# Patient Record
Sex: Female | Born: 1950 | Race: Black or African American | Hispanic: No | Marital: Married | Smoking: Never smoker
Health system: Southern US, Community
[De-identification: ages and names within clinical notes are randomized; demographics above are authoritative.]

## PROBLEM LIST (undated history)

## (undated) ENCOUNTER — Ambulatory Visit (HOSPITAL_COMMUNITY): Discharge status: Absent without leave | Payer: 59

## (undated) DIAGNOSIS — R0789 Other chest pain: Secondary | ICD-10-CM

## (undated) DIAGNOSIS — K7689 Other specified diseases of liver: Secondary | ICD-10-CM

## (undated) DIAGNOSIS — K219 Gastro-esophageal reflux disease without esophagitis: Secondary | ICD-10-CM

## (undated) DIAGNOSIS — M1990 Unspecified osteoarthritis, unspecified site: Secondary | ICD-10-CM

## (undated) DIAGNOSIS — I2510 Atherosclerotic heart disease of native coronary artery without angina pectoris: Secondary | ICD-10-CM

## (undated) DIAGNOSIS — R05 Cough: Secondary | ICD-10-CM

## (undated) DIAGNOSIS — T7840XA Allergy, unspecified, initial encounter: Secondary | ICD-10-CM

## (undated) DIAGNOSIS — I10 Essential (primary) hypertension: Secondary | ICD-10-CM

## (undated) DIAGNOSIS — L509 Urticaria, unspecified: Secondary | ICD-10-CM

## (undated) DIAGNOSIS — E785 Hyperlipidemia, unspecified: Secondary | ICD-10-CM

## (undated) HISTORY — DX: Gastro-esophageal reflux disease without esophagitis: K21.9

## (undated) HISTORY — PX: CHOLECYSTECTOMY: SHX55

## (undated) HISTORY — DX: Urticaria, unspecified: L50.9

## (undated) HISTORY — DX: Other specified diseases of liver: K76.89

## (undated) HISTORY — PX: ABDOMINAL HYSTERECTOMY: SHX81

## (undated) HISTORY — PX: OTHER SURGICAL HISTORY: SHX169

## (undated) HISTORY — DX: Cough: R05

## (undated) HISTORY — PX: BREAST EXCISIONAL BIOPSY: SUR124

## (undated) HISTORY — DX: Other chest pain: R07.89

## (undated) HISTORY — PX: BREAST BIOPSY: SHX20

## (undated) HISTORY — PX: VESICOVAGINAL FISTULA CLOSURE W/ TAH: SUR271

## (undated) HISTORY — DX: Essential (primary) hypertension: I10

## (undated) HISTORY — DX: Allergy, unspecified, initial encounter: T78.40XA

## (undated) HISTORY — DX: Hyperlipidemia, unspecified: E78.5

## (undated) HISTORY — DX: Atherosclerotic heart disease of native coronary artery without angina pectoris: I25.10

---

## 1998-10-03 ENCOUNTER — Ambulatory Visit (HOSPITAL_COMMUNITY): Admission: RE | Admit: 1998-10-03 | Discharge: 1998-10-03 | Payer: Self-pay | Admitting: Unknown Physician Specialty

## 1998-10-05 ENCOUNTER — Encounter: Payer: Self-pay | Admitting: Emergency Medicine

## 1998-10-05 ENCOUNTER — Emergency Department (HOSPITAL_COMMUNITY): Admission: EM | Admit: 1998-10-05 | Discharge: 1998-10-05 | Payer: Self-pay | Admitting: Emergency Medicine

## 1998-12-06 ENCOUNTER — Ambulatory Visit (HOSPITAL_COMMUNITY): Admission: RE | Admit: 1998-12-06 | Discharge: 1998-12-06 | Payer: Self-pay | Admitting: *Deleted

## 1999-06-01 ENCOUNTER — Other Ambulatory Visit: Admission: RE | Admit: 1999-06-01 | Discharge: 1999-06-01 | Payer: Self-pay | Admitting: *Deleted

## 1999-09-15 ENCOUNTER — Other Ambulatory Visit: Admission: RE | Admit: 1999-09-15 | Discharge: 1999-09-15 | Payer: Self-pay | Admitting: Obstetrics and Gynecology

## 2002-04-30 ENCOUNTER — Ambulatory Visit (HOSPITAL_COMMUNITY): Admission: RE | Admit: 2002-04-30 | Discharge: 2002-04-30 | Payer: Self-pay | Admitting: Unknown Physician Specialty

## 2002-04-30 ENCOUNTER — Encounter: Payer: Self-pay | Admitting: Unknown Physician Specialty

## 2003-06-08 ENCOUNTER — Ambulatory Visit (HOSPITAL_COMMUNITY): Admission: RE | Admit: 2003-06-08 | Discharge: 2003-06-08 | Payer: Self-pay | Admitting: *Deleted

## 2003-06-08 ENCOUNTER — Encounter: Payer: Self-pay | Admitting: Unknown Physician Specialty

## 2004-03-07 ENCOUNTER — Other Ambulatory Visit: Admission: RE | Admit: 2004-03-07 | Discharge: 2004-03-07 | Payer: Self-pay | Admitting: *Deleted

## 2004-04-13 ENCOUNTER — Encounter (INDEPENDENT_AMBULATORY_CARE_PROVIDER_SITE_OTHER): Payer: Self-pay | Admitting: Specialist

## 2004-04-13 ENCOUNTER — Observation Stay (HOSPITAL_COMMUNITY): Admission: RE | Admit: 2004-04-13 | Discharge: 2004-04-14 | Payer: Self-pay | Admitting: *Deleted

## 2005-05-02 ENCOUNTER — Ambulatory Visit (HOSPITAL_COMMUNITY): Admission: RE | Admit: 2005-05-02 | Discharge: 2005-05-02 | Payer: Self-pay | Admitting: Unknown Physician Specialty

## 2005-11-26 ENCOUNTER — Inpatient Hospital Stay (HOSPITAL_COMMUNITY): Admission: EM | Admit: 2005-11-26 | Discharge: 2005-11-27 | Payer: Self-pay | Admitting: Emergency Medicine

## 2005-11-26 ENCOUNTER — Encounter: Payer: Self-pay | Admitting: Internal Medicine

## 2005-11-27 ENCOUNTER — Ambulatory Visit: Payer: Self-pay | Admitting: Cardiology

## 2005-11-27 ENCOUNTER — Ambulatory Visit: Payer: Self-pay | Admitting: *Deleted

## 2005-11-27 ENCOUNTER — Ambulatory Visit (HOSPITAL_COMMUNITY): Admission: RE | Admit: 2005-11-27 | Discharge: 2005-11-28 | Payer: Self-pay | Admitting: Cardiology

## 2005-11-28 ENCOUNTER — Ambulatory Visit: Payer: Self-pay | Admitting: Cardiology

## 2005-12-06 ENCOUNTER — Ambulatory Visit: Payer: Self-pay

## 2005-12-06 ENCOUNTER — Encounter: Payer: Self-pay | Admitting: Cardiology

## 2005-12-19 ENCOUNTER — Ambulatory Visit: Payer: Self-pay | Admitting: *Deleted

## 2006-01-02 ENCOUNTER — Other Ambulatory Visit: Admission: RE | Admit: 2006-01-02 | Discharge: 2006-01-02 | Payer: Self-pay | Admitting: *Deleted

## 2006-01-04 ENCOUNTER — Ambulatory Visit (HOSPITAL_COMMUNITY): Admission: RE | Admit: 2006-01-04 | Discharge: 2006-01-04 | Payer: Self-pay | Admitting: Unknown Physician Specialty

## 2006-01-14 ENCOUNTER — Encounter: Admission: RE | Admit: 2006-01-14 | Discharge: 2006-01-14 | Payer: Self-pay | Admitting: Surgery

## 2006-03-06 ENCOUNTER — Ambulatory Visit (HOSPITAL_COMMUNITY): Admission: RE | Admit: 2006-03-06 | Discharge: 2006-03-06 | Payer: Self-pay | Admitting: Unknown Physician Specialty

## 2006-05-22 ENCOUNTER — Ambulatory Visit: Payer: Self-pay | Admitting: *Deleted

## 2006-06-04 ENCOUNTER — Ambulatory Visit: Payer: Self-pay

## 2006-07-24 ENCOUNTER — Encounter: Payer: Self-pay | Admitting: Internal Medicine

## 2006-07-24 ENCOUNTER — Ambulatory Visit (HOSPITAL_COMMUNITY): Admission: RE | Admit: 2006-07-24 | Discharge: 2006-07-24 | Payer: Self-pay | Admitting: Unknown Physician Specialty

## 2007-01-27 ENCOUNTER — Ambulatory Visit (HOSPITAL_COMMUNITY): Admission: RE | Admit: 2007-01-27 | Discharge: 2007-01-27 | Payer: Self-pay | Admitting: Unknown Physician Specialty

## 2007-07-28 ENCOUNTER — Ambulatory Visit: Payer: Self-pay | Admitting: Internal Medicine

## 2007-09-16 ENCOUNTER — Ambulatory Visit: Payer: Self-pay | Admitting: Internal Medicine

## 2007-09-16 DIAGNOSIS — E785 Hyperlipidemia, unspecified: Secondary | ICD-10-CM | POA: Insufficient documentation

## 2007-09-16 DIAGNOSIS — D179 Benign lipomatous neoplasm, unspecified: Secondary | ICD-10-CM | POA: Insufficient documentation

## 2007-09-18 ENCOUNTER — Encounter: Payer: Self-pay | Admitting: Internal Medicine

## 2007-10-01 ENCOUNTER — Encounter: Payer: Self-pay | Admitting: Internal Medicine

## 2007-10-06 ENCOUNTER — Telehealth: Payer: Self-pay | Admitting: Internal Medicine

## 2007-10-09 ENCOUNTER — Ambulatory Visit: Payer: Self-pay | Admitting: Internal Medicine

## 2007-10-20 ENCOUNTER — Telehealth: Payer: Self-pay | Admitting: Internal Medicine

## 2007-12-10 ENCOUNTER — Ambulatory Visit: Payer: Self-pay | Admitting: Internal Medicine

## 2007-12-26 ENCOUNTER — Ambulatory Visit: Payer: Self-pay | Admitting: Gastroenterology

## 2008-01-09 ENCOUNTER — Ambulatory Visit: Payer: Self-pay | Admitting: Gastroenterology

## 2008-01-09 ENCOUNTER — Encounter: Payer: Self-pay | Admitting: Internal Medicine

## 2008-01-09 DIAGNOSIS — K648 Other hemorrhoids: Secondary | ICD-10-CM | POA: Insufficient documentation

## 2008-01-09 DIAGNOSIS — K644 Residual hemorrhoidal skin tags: Secondary | ICD-10-CM | POA: Insufficient documentation

## 2008-01-09 HISTORY — PX: COLONOSCOPY: SHX174

## 2008-01-26 ENCOUNTER — Telehealth: Payer: Self-pay | Admitting: Internal Medicine

## 2008-01-30 ENCOUNTER — Ambulatory Visit (HOSPITAL_COMMUNITY): Admission: RE | Admit: 2008-01-30 | Discharge: 2008-01-30 | Payer: Self-pay | Admitting: Internal Medicine

## 2008-02-05 ENCOUNTER — Emergency Department (HOSPITAL_COMMUNITY): Admission: EM | Admit: 2008-02-05 | Discharge: 2008-02-06 | Payer: Self-pay | Admitting: Emergency Medicine

## 2008-02-12 ENCOUNTER — Encounter: Payer: Self-pay | Admitting: Internal Medicine

## 2008-02-15 LAB — CONVERTED CEMR LAB: TSH: 0.574 microintl units/mL (ref 0.350–5.50)

## 2008-02-16 ENCOUNTER — Ambulatory Visit: Payer: Self-pay | Admitting: Internal Medicine

## 2008-02-16 DIAGNOSIS — R112 Nausea with vomiting, unspecified: Secondary | ICD-10-CM | POA: Insufficient documentation

## 2008-02-16 LAB — CONVERTED CEMR LAB
Cholesterol, target level: 200 mg/dL
HDL goal, serum: 40 mg/dL
LDL Goal: 130 mg/dL

## 2008-02-25 ENCOUNTER — Other Ambulatory Visit: Admission: RE | Admit: 2008-02-25 | Discharge: 2008-02-25 | Payer: Self-pay | Admitting: *Deleted

## 2008-07-19 ENCOUNTER — Ambulatory Visit: Payer: Self-pay | Admitting: Internal Medicine

## 2008-09-06 ENCOUNTER — Ambulatory Visit: Payer: Self-pay | Admitting: Internal Medicine

## 2008-11-16 ENCOUNTER — Encounter: Payer: Self-pay | Admitting: Internal Medicine

## 2008-11-29 ENCOUNTER — Emergency Department (HOSPITAL_COMMUNITY): Admission: EM | Admit: 2008-11-29 | Discharge: 2008-11-29 | Payer: Self-pay | Admitting: Emergency Medicine

## 2008-12-02 ENCOUNTER — Ambulatory Visit: Payer: Self-pay | Admitting: Internal Medicine

## 2008-12-02 DIAGNOSIS — R0789 Other chest pain: Secondary | ICD-10-CM | POA: Insufficient documentation

## 2008-12-06 ENCOUNTER — Encounter: Admission: RE | Admit: 2008-12-06 | Discharge: 2008-12-06 | Payer: Self-pay | Admitting: Internal Medicine

## 2008-12-07 ENCOUNTER — Encounter: Payer: Self-pay | Admitting: Internal Medicine

## 2008-12-07 LAB — CONVERTED CEMR LAB
AST: 19 units/L (ref 0–37)
Alkaline Phosphatase: 53 units/L (ref 39–117)
Amylase: 115 units/L — ABNORMAL HIGH (ref 0–105)
Total Bilirubin: 0.4 mg/dL (ref 0.3–1.2)

## 2008-12-09 ENCOUNTER — Telehealth: Payer: Self-pay | Admitting: Internal Medicine

## 2008-12-23 ENCOUNTER — Ambulatory Visit: Payer: Self-pay | Admitting: Internal Medicine

## 2008-12-26 DIAGNOSIS — I2510 Atherosclerotic heart disease of native coronary artery without angina pectoris: Secondary | ICD-10-CM | POA: Insufficient documentation

## 2009-01-05 ENCOUNTER — Telehealth (INDEPENDENT_AMBULATORY_CARE_PROVIDER_SITE_OTHER): Payer: Self-pay | Admitting: *Deleted

## 2009-01-05 ENCOUNTER — Ambulatory Visit: Payer: Self-pay | Admitting: Gastroenterology

## 2009-01-26 ENCOUNTER — Encounter: Payer: Self-pay | Admitting: Gastroenterology

## 2009-01-26 ENCOUNTER — Encounter: Payer: Self-pay | Admitting: Internal Medicine

## 2009-03-01 ENCOUNTER — Encounter (INDEPENDENT_AMBULATORY_CARE_PROVIDER_SITE_OTHER): Payer: Self-pay | Admitting: Surgery

## 2009-03-01 ENCOUNTER — Ambulatory Visit (HOSPITAL_COMMUNITY): Admission: RE | Admit: 2009-03-01 | Discharge: 2009-03-01 | Payer: Self-pay | Admitting: Surgery

## 2009-03-15 ENCOUNTER — Encounter: Payer: Self-pay | Admitting: Gastroenterology

## 2009-03-19 ENCOUNTER — Emergency Department (HOSPITAL_COMMUNITY): Admission: EM | Admit: 2009-03-19 | Discharge: 2009-03-19 | Payer: Self-pay | Admitting: Family Medicine

## 2009-03-28 ENCOUNTER — Ambulatory Visit: Payer: Self-pay | Admitting: Internal Medicine

## 2009-03-28 DIAGNOSIS — M546 Pain in thoracic spine: Secondary | ICD-10-CM | POA: Insufficient documentation

## 2009-06-24 ENCOUNTER — Telehealth: Payer: Self-pay | Admitting: Internal Medicine

## 2009-08-16 ENCOUNTER — Encounter (INDEPENDENT_AMBULATORY_CARE_PROVIDER_SITE_OTHER): Payer: Self-pay | Admitting: *Deleted

## 2009-08-24 ENCOUNTER — Telehealth: Payer: Self-pay | Admitting: Internal Medicine

## 2009-09-12 ENCOUNTER — Ambulatory Visit: Payer: Self-pay | Admitting: Internal Medicine

## 2009-09-12 DIAGNOSIS — T50995A Adverse effect of other drugs, medicaments and biological substances, initial encounter: Secondary | ICD-10-CM | POA: Insufficient documentation

## 2009-09-12 DIAGNOSIS — K7689 Other specified diseases of liver: Secondary | ICD-10-CM | POA: Insufficient documentation

## 2009-09-22 ENCOUNTER — Encounter: Payer: Self-pay | Admitting: Internal Medicine

## 2009-09-26 ENCOUNTER — Ambulatory Visit: Payer: Self-pay | Admitting: Internal Medicine

## 2009-09-26 LAB — CONVERTED CEMR LAB
AST: 20 units/L (ref 0–37)
HDL: 58 mg/dL (ref >39)
LDL Cholesterol: 172 mg/dL — ABNORMAL HIGH (ref 0–99)
Total CHOL/HDL Ratio: 4.3
VLDL: 18 mg/dL (ref 0–40)

## 2009-11-23 ENCOUNTER — Encounter: Payer: Self-pay | Admitting: Internal Medicine

## 2010-10-13 ENCOUNTER — Encounter: Payer: Self-pay | Admitting: *Deleted

## 2010-10-13 ENCOUNTER — Encounter: Payer: Self-pay | Admitting: Internal Medicine

## 2010-10-13 LAB — CONVERTED CEMR LAB
Glucose: 98 mg/dL
HDL: 51 mg/dL
LDL Cholesterol: 164 mg/dL

## 2010-11-20 ENCOUNTER — Ambulatory Visit: Payer: Self-pay | Admitting: Internal Medicine

## 2010-11-30 ENCOUNTER — Encounter: Payer: Self-pay | Admitting: *Deleted

## 2011-01-14 ENCOUNTER — Encounter: Payer: Self-pay | Admitting: Unknown Physician Specialty

## 2011-01-23 NOTE — Miscellaneous (Signed)
Summary: labs done from pt's work   Clinical Lists Changes  Observations: Added new observation of CHOL/HDL: 4.5  (10/13/2010 14:24) Added new observation of LDL: 164 mg/dL (46/96/2952 84:13) Added new observation of HDL: 51.0 mg/dL (24/40/1027 25:36) Added new observation of TRIGLYC TOT: 83 mg/dL (64/40/3474 25:95) Added new observation of CHOLESTEROL: 232 mg/dL (63/87/5643 32:95) Added new observation of HGB: 13.0 g/dL (18/84/1660 63:01) Added new observation of CREATININE: 0.8 mg/dL (60/09/9322 55:73) Added new observation of K SERUM: 4.2 meq/L (10/13/2010 14:24) Added new observation of GLU MON POC: 98 mg/dL (22/01/5426 06:23)

## 2011-01-23 NOTE — Assessment & Plan Note (Signed)
Summary: DISCUSS LAB RESULTS FROM WELLNESS CK AT WORK // RS   Vital Signs:  Patient profile:   60 year old female Menstrual status:  hysterectomy Height:      60 inches Weight:      165 pounds BMI:     32.34 Pulse rate:   60 / minute BP sitting:   130 / 80  (left arm) Cuff size:   regular  Vitals Entered By: Romualdo Bolk, CMA (AAMA) (November 20, 2010 8:28 AM)  Nutrition Counseling: Patient's BMI is greater than 25 and therefore counseled on weight management options. CC: Discuss labs from Wellness Exam at work, Hypertension Management   History of Present Illness: Kaylee Adams comes in today  for  concerns about her health screening lab. Since last visit  here  there have been no major changes in health status  . Seen by Dr Tenny Craw in 12 10 who made no recs except   that statin med should be considered. Doesnt want to take  this however.  Her labs showed MCV79.9  but LDL was 162   HDL 51 and tg below 100   fbs was 98 and bp 123/80 Concerned about diabetes. Sleeping ok .  had begun to exercise but busy at work  and stopped. No CP or sob or numbness.  HT: taking meds without problem .  takinjg asa  also   Hypertension History:      She denies headache, chest pain, palpitations, dyspnea with exertion, orthopnea, PND, peripheral edema, visual symptoms, neurologic problems, syncope, and side effects from treatment.  She notes no problems with any antihypertensive medication side effects.        Positive major cardiovascular risk factors include female age 54 years old or older, hyperlipidemia, and hypertension.  Negative major cardiovascular risk factors include non-tobacco-user status.        Positive history for target organ damage include ASHD (either angina/prior MI/prior CABG).     Preventive Screening-Counseling & Management  Alcohol-Tobacco     Alcohol drinks/day: 0     Smoking Status: never     Year Quit: over 20 years ago  Caffeine-Diet-Exercise     Caffeine use/day:  2     Does Patient Exercise: yes     Type of exercise: walking     Times/week: 7  Current Problems (verified): 1)  Hepatic Cyst  (ICD-573.8) 2)  Adverse Reaction To Medication  (ICD-995.29) 3)  Back Pain, Thoracic Region, Left  (ICD-724.1) 4)  Cad, Native Vessel  (ICD-414.01) 5)  Hyperlipidemia  (ICD-272.4) 6)  Hypertension  (ICD-401.9) 7)  Chest Pain, Atypical  (ICD-786.59) 8)  Hx of Nausea and Vomiting  (ICD-787.01) 9)  Internal Hemorrhoids  (ICD-455.0) 10)  External Hemorrhoids  (ICD-455.3) 11)  Lipoma Nos  (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 1 By Mouth Once Daily 2)  Norvasc 2.5 Mg Tabs (Amlodipine Besylate) .Marland Kitchen.. 1 By Mouth Once Daily  Allergies (verified): 1)  ! Penicillin 2)  Lisinopril  Past History:  Past medical, surgical, family and social histories (including risk factors) reviewed, and no changes noted (except as noted below).  Past Medical History: Reviewed history from 09/12/2009 and no changes required. Hyperlipidemia Hypertension childbirth x 5 CAD  30% lad lesion on cath    hepatic cyst on ct [place]  Past Surgical History: Reviewed history from 09/12/2009 and no changes required. Hysterectomy for fibroid tumors Breast Bx    Cholecystectomy  Past History:  Care Management: Surgery: Dr. Michaell Cowing [facility]astroenterology:[facility]  Cardiology:[facility]- Dr. Amado Nash- in the past  Family History: Reviewed history from 01/05/2009 and no changes required. Family History of CAD Female 1st degree relative  father died in his 59s Family History Breast cancer 1st degree relative <50 sister Family History Lung cancerbrother Family History Hypertensionmother     Social History: Reviewed history from 01/05/2009 and no changes  required. 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    Physical Exam  General:  Well-developed,well-nourished,in no acute distress; alert,appropriate and cooperative throughout examination Head:  normocephalic and atraumatic.   Neck:  No deformities, masses, or tenderness noted. Lungs:  Normal respiratory effort, chest expands symmetrically. Lungs are clear to auscultation, no crackles or wheezes. Heart:  Normal rate and regular rhythm. S1 and S2 normal without gallop, murmur, click, rub or other extra sounds. Abdomen:  Bowel sounds positive,abdomen soft and non-tender without masses, organomegaly or   noted. Msk:  no joint swelling and no joint warmth.   Pulses:  pulses intact without delay   Neurologic:  alert & oriented X3 and gait normal.  non focal  Skin:  turgor normal, color normal, and no ecchymoses.   Cervical Nodes:  No lymphadenopathy noted Psych:  Oriented X3, memory intact for recent and remote, normally interactive, good eye contact, not anxious appearing, and not depressed appearing.   lab reviewed:  all nl or  insig   except for the lipids  results  Impression & Recommendations:  Problem # 1:  HYPERLIPIDEMIA (ICD-272.4) reviewed the  heatlh screen ing    LDL164 tc 232 hdl 51 tg 83  tc/hdl ratio 4.5 .   patients doesnt want to take a statin although poss rec by her cardiologist  .  Disc risk benefit of meds  Labs Reviewed: SGOT: 20 (09/22/2009)   SGPT: 16 (12/07/2008)  Lipid Goals: Chol Goal: 200 (02/16/2008)   HDL Goal: 40 (02/16/2008)   LDL Goal: 130 (02/16/2008)   TG Goal: 150  (02/16/2008)  Prior 10 Yr Risk Heart Disease: N/A (09/12/2009)   HDL:58 (09/22/2009), 41 (02/12/2008)  LDL:172 (09/22/2009), 121 (02/12/2008)  Chol:248 (09/22/2009), 178 (02/12/2008)  Trig:90 (09/22/2009), 80 (02/12/2008)  Problem # 2:  HYPERTENSION (ICD-401.9) adequately controlled  Her updated medication list for this problem includes:    Hyzaar 100-25 Mg Tabs (Losartan potassium-hctz) .Marland Kitchen... 1 by mouth once daily    Norvasc 2.5 Mg Tabs (Amlodipine besylate) .Marland Kitchen... 1 by mouth once daily  BP today: 130/80 Prior BP: 135/78 (09/26/2009)  Prior 10 Yr Risk Heart Disease: N/A (09/12/2009)  Labs Reviewed: K+: 4.3 (02/12/2008) Creat: : 0.72 (02/12/2008)   Chol: 248 (09/22/2009)   HDL: 58 (09/22/2009)   LDL: 172 (09/22/2009)   TG: 90 (09/22/2009)  Problem # 3:  CAD, NATIVE VESSEL (ICD-414.01) mild non obsttructive  no signs  Her updated medication list for this problem includes:    Hyzaar 100-25 Mg Tabs (Losartan potassium-hctz) .Marland Kitchen... 1 by mouth once daily    Norvasc 2.5 Mg Tabs (Amlodipine besylate) .Marland Kitchen... 1 by mouth  once daily    Aspirin 81 Mg Chew (Aspirin) .Marland Kitchen... 1 by mouth as directed  Problem # 4:  overweight almost obese  per  screening .     disc strategies  weight watchers etc.   Complete Medication List: 1)  Hyzaar 100-25 Mg Tabs (Losartan potassium-hctz) .Marland Kitchen.. 1 by mouth once daily 2)  Norvasc 2.5 Mg Tabs (Amlodipine besylate) .Marland Kitchen.. 1 by mouth once daily 3)  Aspirin 81 Mg Chew (Aspirin) .Marland Kitchen.. 1 by mouth as directed  Hypertension Assessment/Plan:      The patient's hypertensive risk group is category C: Target organ damage and/or diabetes.  Today's blood pressure is 130/80.  Her blood pressure goal is < 140/90.  Patient Instructions: 1)  Consider medication for your lipid control  2)  But you may do well on weight watchers and healthy weight loss  to get a better   readingl 3)  Mediterranean type diet is heart healthy. 4)  Recheck LIPIDS  in 4-5 months and then return office  visit  5)  ROV in 4-5 months .    Orders Added: 1)  Est. Patient Level IV [41324]   greater than 50% of visit spent in counseling    25 minutes .

## 2011-01-25 NOTE — Letter (Signed)
Summary: Health Screening Results  Health Screening Results   Imported By: Maryln Gottron 12/05/2010 10:07:22  _____________________________________________________________________  External Attachment:    Type:   Image     Comment:   External Document

## 2011-04-05 LAB — URINALYSIS, ROUTINE W REFLEX MICROSCOPIC
Bilirubin Urine: NEGATIVE
Hgb urine dipstick: NEGATIVE
Ketones, ur: NEGATIVE mg/dL
Nitrite: NEGATIVE
Protein, ur: NEGATIVE mg/dL
Specific Gravity, Urine: 1.012 (ref 1.005–1.030)
pH: 6 (ref 5.0–8.0)

## 2011-04-05 LAB — DIFFERENTIAL
Eosinophils Absolute: 0 10*3/uL (ref 0.0–0.7)
Lymphs Abs: 2.3 10*3/uL (ref 0.7–4.0)
Monocytes Relative: 3 % (ref 3–12)
Neutrophils Relative %: 53 % (ref 43–77)

## 2011-04-05 LAB — CBC
HCT: 38.5 % (ref 36.0–46.0)
Hemoglobin: 13.2 g/dL (ref 12.0–15.0)
MCHC: 34.3 g/dL (ref 30.0–36.0)
MCV: 79.8 fL (ref 78.0–100.0)
MCV: 80.3 fL (ref 78.0–100.0)
RBC: 4.8 MIL/uL (ref 3.87–5.11)

## 2011-04-05 LAB — COMPREHENSIVE METABOLIC PANEL
ALT: 34 U/L (ref 0–35)
AST: 26 U/L (ref 0–37)
BUN: 12 mg/dL (ref 6–23)
CO2: 29 mEq/L (ref 19–32)
Calcium: 9.7 mg/dL (ref 8.4–10.5)
Chloride: 99 mEq/L (ref 96–112)
Creatinine, Ser: 0.81 mg/dL (ref 0.4–1.2)
Potassium: 3.6 mEq/L (ref 3.5–5.1)
Total Protein: 7.4 g/dL (ref 6.0–8.3)

## 2011-04-05 LAB — BASIC METABOLIC PANEL
CO2: 30 mEq/L (ref 19–32)
Chloride: 104 mEq/L (ref 96–112)
Creatinine, Ser: 0.88 mg/dL (ref 0.4–1.2)
GFR calc Af Amer: >60 mL/min (ref >60)
Potassium: 4 mEq/L (ref 3.5–5.1)

## 2011-04-05 LAB — BRAIN NATRIURETIC PEPTIDE: Pro B Natriuretic peptide (BNP): <30 pg/mL (ref 0.0–100.0)

## 2011-04-16 ENCOUNTER — Telehealth: Payer: Self-pay | Admitting: *Deleted

## 2011-04-16 NOTE — Telephone Encounter (Signed)
Pt is coming in on Friday and thinks that she was suppose to have labs. Labs order need to be fax to [facility] in [place], [place]

## 2011-04-16 NOTE — Telephone Encounter (Signed)
Left message for pt to call back  °

## 2011-04-17 NOTE — Telephone Encounter (Signed)
Pt aware and note faxed

## 2011-04-20 ENCOUNTER — Ambulatory Visit: Payer: Self-pay | Admitting: Internal Medicine

## 2011-05-08 NOTE — Assessment & Plan Note (Signed)
[facility]                            CARDIOLOGY OFFICE NOTE   DESTENIE, INGBER                         MRN:          [id number]  DATE:09/06/2008                            DOB:          08/31/1951    IDENTIFICATION:  [name] is a 60 year old woman.  I saw her back in  August 2008.  She was previously followed by Dr. Graceann Congress.  The  patient has a history of minimal CAD (20-30% LAD lesion), hypertension,  and dyslipidemia.   In the interval, she has done okay.  She has cut back on her walking  some.  Denies chest pressure.  No shortness of breath.  No dyspnea with  exertion.  She is now followed by Dr. Berniece Andreas for her primary care.  Blood work was drawn by her.   Her current medicines include lisinopril and hydrochlorothiazide 20/12.5  daily.  The patient has stopped Lipitor, also she has forgot to take  aspirin.   PHYSICAL EXAMINATION:  GENERAL:  The patient is in no distress.  VITAL SIGNS:  Blood pressure is 138/75, pulse is 76, weight 166 up 5  pounds from previous.  LUNGS:  Clear.  No rales.  CARDIAC:  Regular rate and rhythm, S1 and S2.  No S3.  No significant  murmurs.  ABDOMEN:  Benign.  EXTREMITIES:  No edema.   12-lead EKG shows normal sinus rhythm at 76 beats per minute.   IMPRESSION:  1. Coronary artery disease minimal.  Again, we will check on her lipid      numbers and be in touch with her.  I recommended she restart an      aspirin.  2. Dyslipidemia.  I will be in touch with her regarding this.  3. Hypertension, adequate control.   Tentatively, I will put follow up for one year's time, but I will be in  touch with her regarding her blood work.  She asked today, if she is to  be on potassium.  She is not on it now, I will find out what it was.  Her blood work was drawn after she stopped the potassium.     Pricilla Riffle, MD, [facility]  Electronically Signed    PVR/MedQ  DD: 09/06/2008  DT: 09/07/2008  Job #:  [id number]   cc:   Neta Mends. Fabian Sharp, MD

## 2011-05-08 NOTE — Op Note (Signed)
NAME:  Kaylee Adams, CONWAY                  ACCOUNT NO.:  [id number]   MEDICAL RECORD NO.:  [id number]          PATIENT TYPE:  AMB   LOCATION:  SDS                          FACILITY:  [facility]   PHYSICIAN:  Ardeth Sportsman, MD     DATE OF BIRTH:  Nov 20, 1951   DATE OF PROCEDURE:  DATE OF DISCHARGE:  03/01/2009                               OPERATIVE REPORT   PRIMARY CARE PHYSICIAN:  Neta Mends. Fabian Sharp, MD   GASTROLOGIST:  Rachael Fee, MD, [facility].   SURGEON:  Ardeth Sportsman, MD   ASSISTANT:  Anselm Pancoast. 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   PROCEDURE PERFORMED:  Laparoscopic cholecystectomy with intraoperative  cholangiogram.   ANESTHESIA:  1. General anesthesia.  2. Local anesthetic and a field block around all port sites.   SPECIMEN:  Gallbladder.   DRAINS:  None.   ESTIMATED BLOOD LOSS:  Less than 10 mL.   COMPLICATIONS:  None apparent.   INDICATIONS:  Ms. Cossey is a pleasant, 60 year old, obese female who has  had some episodes of severe abdominal pain, nausea, and vomiting.  She  had a workup that is, otherwise, negative by her primary care physician,  gastroenterologist.  Surgical consultation was requested.  She has no  gallstones.   The anatomy and physiology of hepatobiliary and pancreatic function were  discussed.  Pathophysiology of cholecystolithiasis with its natural  history and risks were discussed.  Options were discussed, and  recommendations were made for laparoscopic cholecystectomy with  intraoperative cholangiogram.  Risks, benefits, and alternatives were  discussed.  Questions answered, and she agreed to proceed.   OPERATIVE FINDINGS:  She has some mild gallbladder wall thickening.  No  major adhesions.  Cholangiogram showed classic anatomy.   DESCRIPTION OF PROCEDURE:   Informed consent was confirmed.  The patient  voided just prior to coming to the operating room.  She had sequential  compression devices active during the entire case.  She underwent  general anesthesia without any difficulty.  She was in supine, both arms  tucked.  Her abdomen was prepped and draped in a sterile fashion.  A  surgical time-out confirmed our plan.   A #5-mm port was placed in the right upper quadrant using optical entry  technique with the patient in steep reverse Trendelenburg and right side  up.  A camera inspection revealed no intraabdominal injury.  Under  direct visualization, 5-mm ports were placed in the right flank and  through the umbilicus.  A tunneled port was tunneled through the  falciform ligament at an angle.   The gallbladder fundus was grasped and elevated cephalad.  The  peritoneal coverings between the gallbladder and liver were freed on the  anteromedial and posterior walls of the gallbladder.  Circumferential  dissection was done to have freed the proximal half of the gallbladder  off the liver bed.  Dissection was done to get a good critical view.  There was a narrow anterior branch that  was coming off the infundibulum  and going down the porta consistent with anterior branch of the cystic  artery.  One clip on the gallbladder site and two clips slightly  proximally made.  With that all the way, I could see the cystic ducts  were well.  Further dissection was done, and a small wispy posterior  cystic arterial branch was isolated and controlled with cautery.   Two clips were placed in the infundibulum, and a partial cystotomy was  performed.  A 5-French cholangiogram catheter was placed through a  subcostal stab incision and placed in the cystic duct without any  difficulty.  A cholangiogram was done, run using dilute radiopaque  contrast and continuous fluoroscopy.  Contrast flowed well from helical  side branch consistent with cystic duct  cannulization.  The contrast  refluxed up the common hepatic duct into the right and left intrahepatic  chains and then flowed back down the common bile duct across the normal  ampulla into a normal duodenum.  This was consistent with a normal  cholangiogram.   The cholangiocatheter was removed.  Four clips were placed in the cystic  duct, and cystic duct transection was completed.  Even though the cystic  duct was somewhat dilated, the clips came across the duct well.  The  gallbladder was freed from its major attachment on the liver bed.  There  was a breach in the gallbladder wall with some spillage of bile that was  aspirated but no stones.  The gallbladder was placed inside an Endocatch  bag and brought out the subxiphoid port without any dilation.  The  fascial defect was too small, that my pinky did not pass, so I did not  feel it required more aggressive closure.  Copious irrigation was done  with clear return.  Capnoperitoneum was evacuated and ports removed.  Skin was closed using 4-0 Monocryl stitch.  Sterile dressing was  applied.   The patient was extubated and sent to the recovery room in stable  condition.  I discussed postop care with the patient in our office and  then just prior to surgery, and I am about to confirm with her family.      Ardeth Sportsman, MD  Electronically Signed     SCG/MEDQ  D:  03/01/2009  T:  03/01/2009  Job:  [id number]   cc:   Neta Mends. Fabian Sharp, MD  Rachael Fee, MD

## 2011-05-08 NOTE — Assessment & Plan Note (Signed)
[facility]                            CARDIOLOGY OFFICE NOTE   Kaylee Adams, Kaylee Adams                         MRN:          [id number]  DATE:12/23/2008                            DOB:          05-Oct-1951    IDENTIFICATION:  Kaylee Adams is a 60 year old woman.  I last saw her in  September.  She has a history of minimal CAD by cath (20-30% LAD  lesion), hypertension, and dyslipidemia.   The patient was seen in the emergency room on November 29, 2008.  She  came with a complaint of chest pain.  The pain was continuous, began in  her chest and upper back associated with nausea.  No shortness of  breath.  Again, the pain was constant.  She was discharged with  recommendation to follow up for an outpatient stress.   In the interval, the patient is in touch with Dr. Fabian Sharp.  Concerned  that her symptoms may be GI.  She was placed on Zantac and also had an  abdominal ultrasound done which I am not sure of.  She has a GI followup  pending.   The patient said her pains went away few days after ER visit and she has  had none.  Since her breathing, it has been okay.   CURRENT MEDICINES:  1. Lisinopril/hydrochlorothiazide 20/12.5.  2. Zantac.   PHYSICAL EXAMINATION:  GENERAL:  On exam, the patient is in no distress.  VITAL SIGNS:  Blood pressure 139/82, pulse 72, and weight is 164.  NECK:  No JVD.  LUNGS:  Clear.  CARDIAC:  Regular rate and rhythm.  S1 and S2.  No S3.  No significant  murmurs.  ABDOMEN:  Benign and nontender.  No hepatomegaly.  EXTREMITIES:  No edema.   A 12-lead EKG, normal sinus rhythm.  77 beats per minute.  Nonspecific  ST changes unchanged.   IMPRESSION:  1. Chest pain.  It does not sound cardiac by the description.  It has      gone.  Note, she is on an acid inhibitor, seems to be helping and      has a gastrointestinal followup.  I would not plan any further      testing.  2. History of minimal coronary artery disease.  We will  need to      follow.  3. Lipids had been high in the past noted on December 07, 2008.  Her      total cholesterol was again done 259, HDL 65, LDL was very high at      409.  I discussed this with the patient with her cath findings, I      would recommend starting statin such as Zocor 40, but I would like      to have her gastrointestinal evaluation done first.  I do not have      any records to see what if there is any concern of liver issues.   Once the patient has been evaluated by GI and if the workup is negative  for liver problems, she should began Zocor.  Contact our office have her  lipids checked in 8-10 weeks' time from the start date.  I will need to  make sure the patient has followup.     Pricilla Riffle, MD, [facility]  Electronically Signed    PVR/MedQ  DD: 12/26/2008  DT: 12/26/2008  Job #: [id number]

## 2011-05-08 NOTE — Assessment & Plan Note (Signed)
[facility]                            CARDIOLOGY OFFICE NOTE   REINETTE, CUNEO                         MRN:          [id number]  DATE:07/28/2007                            DOB:          Apr 26, 1951    IDENTIFICATION:  Ms. Manka is a woman who was previously followed by Dr.  Graceann Congress.  She has a history of hypertension, minimal CAD with an  ostial 20-30% LAD lesion, dyslipidemia.  She was last seen in May, 2007.  Previously she had been followed also by Dr. Weyman Pedro in [place] who  has since passed away and she will need to have a new primary.   On talking with the patient, she is walking 3 miles per day, denies  chest pain, no shortness of breath. Blood pressure at home has been in  the 120's systolic.   CURRENT MEDICATIONS:  Diovan 160, garlic and aspirin 325. Potassium  (question dose).   PHYSICAL EXAMINATION:  The patient is in no distress. Blood pressure  131/77, pulse is 75 and regular, weight 161 down from 167 last visit.  NECK:  No bruits, JVP is normal.  CARDIAC EXAM:  Regular rate and rhythm S1, S2, no S3, no murmurs.  LUNGS:  Clear.  EXTREMITIES:  Have 2+ pulses. No lower extremity edema, no bruits.   IMPRESSION:  1. Hypertension, adequate control.  2. History of reported minimal coronary artery disease - would follow      on current regimen.  3. Dyslipidemia. Patient is reluctant to take medicine. I will      followup with Dr. Magnus Ivan office regarding her blood work in the      past.  4. Health care maintenance.  Have given her names of primary care      providers as she will need follow up.   I will see her back in 1 year's time but be in touch with her once I  have seen the blood work.     Pricilla Riffle, MD, [facility]  Electronically Signed    PVR/MedQ  DD: 07/28/2007  DT: 07/28/2007  Job #: [id number]

## 2011-05-11 NOTE — Cardiovascular Report (Signed)
NAME:  Kaylee Adams, Kaylee Adams                  ACCOUNT NO.:  [id number]   MEDICAL RECORD NO.:  [id number]          PATIENT TYPE:  OIB   LOCATION:  6527                         FACILITY:  [facility]   PHYSICIAN:  Arturo Morton. Riley Kill, M.D. [facility] OF BIRTH:  03-Nov-1951   DATE OF PROCEDURE:  11/27/2005  DATE OF DISCHARGE:                              CARDIAC CATHETERIZATION   INDICATIONS:  [name] is a 60 year old who presents with dyspnea,  shortness of breath and some chest discomfort. The current study was done to  assess coronary anatomy. CT scan was unremarkable for pulmonary emboli,  although there is a question of enlarged thymus. Further evaluation is  recommended.   PROCEDURES:  1.  Left heart catheterization.  2.  Selective coronary arteriography.  3.  Selective left ventriculography.  4.  Aortic root aortography.   DESCRIPTION OF PROCEDURE:  The patient was brought to the catheterization  laboratory, prepped and draped in the usual fashion. Through an anterior  puncture, the right femoral artery was easily entered. She was noted to have  some calcification at the ostium of the left main. Both the JL-4 and JL-3.5  catheters were slightly too large to engage well and we ended up using a JL-  3.0 guiding catheter. Importantly, because of the calcification, we were  careful to approach the left main and she was given some coronary  nitroglycerin to make sure there was maximum basal dilatation. This was also  true in the right coronary where there was some ostial spasm associated with  engagement. She ended up tolerating the procedure well without any  complication and she was taken to the holding area in satisfactory clinical  condition. She was given also intravenous metoprolol to try to bring her  pressure down as well as hydralazine at the completion of procedure. There  were no complications.   HEMODYNAMIC DATA:  1.  Central aortic pressure 209/92, mean 139.  2.  Left ventricular  pressure 175/18.  3.  No gradient pullback across the aortic valve.   ANGIOGRAPHIC DATA:  1.  Ventriculography was done in the RAO projection. Overall systolic      function was vigorous without segmental wall motion abnormality.  2.  The aortic root demonstrated no evidence of aortic dissection or an      aortic root regurgitation.  3.  The left main coronary artery has a calcified shelf ostially. However,      this does not appear to be critical and there was not significant      damping within the left main. There was also some reflux of dye.      Overall, the left main ostial diameter appeared to be probably in the      range of about 3 mm.  4.  The left anterior descending artery has mild ostial irregularity of      about 20-30%. The LAD provides an insignificant first diagonal, large      second diagonal and other than minor luminal irregularity the LAD is      without critical narrowing. The distal LAD wraps the  apical tip.  5.  The circumflex provides a small intermediate, two smaller marginal's and      then terminates as a large posterolateral and codominant posterior      descending artery. This does not appear to be high-grade disease      involving any of these.  6.  The right coronary is small, codominant vessel with small caliber and      without significant focal narrowing.   CONCLUSION:  1.  Preserved overall left ventricular function.  2.  No evidence of aortic regurgitation or dissection.  3.  A 30% calcified ostial irregularity of the left main carefully described      in the angiographic study.  4.  Mild irregularity of proximal left anterior descending artery without      significant focal narrowing.   DISPOSITION:  The patient has had some significant shortness of breath. We  will do a 2-D echocardiogram to better assess this as well. We will also  review her CT scan to decide what further evaluation measures will be  necessary.      Arturo Morton. Riley Kill,  M.D. [facility]  Electronically Signed     TDS/MEDQ  D:  11/27/2005  T:  11/27/2005  Job:  [id number]   cc:   CV Laboratory   Patient's medical record

## 2011-05-11 NOTE — Discharge Summary (Signed)
NAME:  HANNE, KEGG                  ACCOUNT NO.:  [id number]   MEDICAL RECORD NO.:  [id number]          PATIENT TYPE:  OIB   LOCATION:  6527                         FACILITY:  [facility]   PHYSICIAN:  Cecil Cranker, M.D.DATE OF BIRTH:  1951/08/20   DATE OF ADMISSION:  11/27/2005  DATE OF DISCHARGE:  11/28/2005                                 DISCHARGE SUMMARY   PRIMARY CARE PHYSICIANS:  Cardiologist:  Dr. Cecil Cranker.  Primary  care physician:  Weyman Pedro, M.D.   DISCHARGE DIAGNOSES:  1.  Chest pain, negative cardiac enzymes, status post cardiac      catheterization on November 27, 2005 by Dr. Riley Kill showing preserved      overall left ventricular function.  No evidence of aortic regurgitation      or dissection.  Mild irregularity of proximal left anterior descending      artery without significant focal narrowing.  A 30% calcified ostial      irregularity of the left main of about 20 to 30%.  For further details      see catheterization report.  There did not appear to be any high grade      disease.  2.  Progressive dyspnea. Status post 2-dimensional echocardiogram results      pending at discharge. Status post CT scan of the chest negative for      pulmonary embolism.  3.  Hypertension.  4.  Dyslipidemia.   PAST MEDICAL HISTORY:  Includes hypertension x6 years, dyslipidemia newly  diagnosed, status post partial hysterectomy.  Status post stress test in  1999 at [facility] which was negative per patient.   ALLERGIES:  Include PENICILLIN.   HOSPITAL COURSE:  Ms. Simko is a 60 year old African-American female  followed by Dr. Eliberto Ivory in [place], [place], with no known history of  coronary artery disease who was initially admitted to [facility]  with complaints of chest discomfort and dyspnea.  We were asked to consult  on the patient at [facility].  We assumed her care and transfer her  over to [facility] for further evaluation.  Initial  examination showed chest  x-ray with no acute cardiopulmonary disease.  Electrocardiogram showed sinus  rhythm at 68 beats per minute with nonspecific ST abnormalities.  Initial  blood work showed troponin of 0.01.  ABG's within normal limits.  She did  have mildly elevated CK-MB with normal troponin.  The patient was started on  Plavix, continued her current medications, dosed with Lovenox and  transferred to [facility] for further evaluation with cardiac  catheterization.  The patient was taken to the catheterization laboratory on  November 27, 2005 by Dr. [name] with results as stated above.  The  patient tolerated the procedure without complications.  On the day of  discharge Dr. Glennon Hamilton was in to see the patient.  2-dimensional  echocardiogram was done with results not available at this time, however,  the patient continues to complain of dyspnea.  She will be scheduled for an  outpatient dobutamine echocardiogram for further evaluation.  Duration of discharge 30 minutes.      Dorian Pod, NP    ______________________________  E. Graceann Congress, M.D.    MB/MEDQ  D:  11/28/2005  T:  11/28/2005  Job:  [id number]   cc:   Dr. Weyman Pedro

## 2011-05-11 NOTE — H&P (Signed)
NAME:  Kaylee Adams                  ACCOUNT NO.:  [id number]   MEDICAL RECORD NO.:  [id number]          PATIENT TYPE:  INP   LOCATION:  A225                          FACILITY:  [facility]   PHYSICIAN:  Margaretmary Dys, M.D.DATE OF BIRTH:  1951-07-06   DATE OF ADMISSION:  11/26/2005  DATE OF DISCHARGE:  LH                                HISTORY & PHYSICAL   PRIMARY CARE PHYSICIAN:  The patient is unassigned.   ADMISSION DIAGNOSES:  1.  Chest pain, rule out myocardial infarction.  2.  Progressive dyspnea, possible anginal equivalent.  3.  History of hypertension.  4.  Progressive shortness of breath with cough over the last 2 weeks.   HISTORY OF PRESENT ILLNESS:  [name] is a 60 year old African-American  female who presented to the emergency room today with progressive shortness  of breath over the last two weeks.  She actually went into Dr. Eliberto Ivory, who  is a primary care physician in [place] and wanted to admit her to [facility], but she preferred to come to [facility].   The patient reports exertional dyspnea which comes anytime she ambulates for  a very short distance. This has been happening over the past two weeks.  More noticeable for her is after showering in the morning, she gets very  short of breath and also after walking from her office to her car which is  about 200 feet.  She denies any significant cough or sputum production.  She  has no wheezing.  She says she had leg swelling in her right leg, but that  has gotten better.  She denies any headache, dizziness, or lightheadedness.  Associated with the shortness of breath is a chest tightness/pressure which  is retrosternal and can be exacerbated by exertion.  The patient rarely has  these symptoms when she is lying still and relaxing.   She denies any pleuritic chest pain, fever, chills, or rigors.  No night  sweats, no hemoptysis, no recent flu-like symptoms.   The patient traveled to [place] about  a week ago; however, her  shortness of breath predates the long-distance she traveled on the road.  The patient denies any previous history of DVTs.   Initial evaluation in the emergency room was unremarkable .  There were  concerns of a possible cardiac event, and she was admitted.  I went and saw  her on the floor.  I reviewed her chart and realized that back in [place] when  she saw her primary care physician, oxygen saturation at that time while she  was ambulating was 85% on room air.   I subsequently ordered spiral CT of her chest to rule out coronary emboli.   REVIEW OF SYSTEMS:  Essentially as in History of Present Illness.   PAST MEDICAL HISTORY:  1.  Hypertension.  2.  Status post hysterectomy.   MEDICATIONS:  1.  Diovan.  2.  Hydrochlorothiazide 1 p.o. daily.  3.  Tylenol 650 mg p.o. q.4 h. p.r.n. as needed.   ALLERGIES:  No known drug allergies.  She says she gets  nausea when she  takes PENICILLIN.  She denies any hives or rash.   FAMILY HISTORY:  Noncontributory.  There is no significant history of  diabetes.  She believes her father may have died in his late 70s from heart  disease, likely myocardial infarction.  Mother, who is alive and healthy.   SOCIAL HISTORY:  The patient is married, has 5 children all healthy.  She  does not smoke cigarettes and denies any alcohol or illicit drug use.  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   PHYSICAL EXAMINATION:  GENERAL:  Conscious, alert, comfortable, not in acute  distress.  A very pleasant lady.  Alert and oriented to time, place, and  person.  VITAL SIGNS: Blood pressure was 166/95, pulse 80, respiratory rate 20,  temperature 97.8, oxygen saturation 99% on room air.  HEENT: Normocephalic and atraumatic.  Oral mucosa was moist with no  exudates.  NECK:  Supple.  No JVD or lymphadenopathy.  LUNGS: Clear with good air entry bilaterally.  No crackles, wheezing, or  rhonchi was heard.  HEART:  S1 and S2  regular.  No S3, S4, gallops, or rubs.  ABDOMEN: Soft and nontender.  Bowel sounds positive.  No masses palpable.  EXTREMITIES:  No pitting pedal edema.  No calf induration or tenderness was  noted.  CNS:  Exam was grossly intact with no focal deficits.   LABORATORY DATA:  Blood gas on room air showed pH 7.42, PCO2 39.5, PO2 99,  bicarbonate 25.3, saturation 98.5%.  White blood cell count 6, hemoglobin  13.2, hematocrit 38.2, platelet count 323.  ESR 12.  Sodium 135, potassium  3.7, chloride 104, CO2 27, glucose 86, BUN 8, creatinine 0.7, calcium 9.  Cardiac enzymes negative.   A 12-lead EKG showed normal sinus rhythm with no acute ST-T changes.   Chest x-ray was negative.   Spiral CT scan of chest to rule out pulmonary emboli was pending.   ASSESSMENT AND PLAN:  A 60 year old African-American female presents with  progressive shortness of breath which is exertional.  She denied other  symptoms of heart failure including orthopnea, paroxysmal nocturnal dyspnea,  or significant edema bilaterally.   I am very concerned about the nature of her chest pain, chest pressure and  being worse when she exerts herself.  I am concerned also about the fact she  was hypoxemic on ambulation here.   I am concerned that patient may have pulmonary embolism.  I requested spiral  CT of the chest the results of which are pending at the time of this  dictation.  If her spiral CT chest is negative, then my index for suspicion  for cardiac disease would be significantly elevated.   As a result, I will treat her with full anticoagulation and give her beta  blocker if she becomes tachycardic.   I request cardiology to see her in the morning.  I will keep her n.p.o. from  midnight.   I will cycle her cardiac enzymes.   I have discussed the above plan with the patient who verbalized full  understanding.  The patient will see [facility] in the morning.      Margaretmary Dys, M.D.   Electronically Signed     AM/MEDQ  D:  11/26/2005  T:  11/26/2005  Job:  [id number]

## 2011-05-11 NOTE — Op Note (Signed)
NAME:  Kaylee Adams, Kaylee Adams                            ACCOUNT NO.:  [id number]   MEDICAL RECORD NO.:  [id number]                   PATIENT TYPE:  AMB   LOCATION:  DAY                                  [facility]AC[facility]  [facility]   PHYSICIAN:  Almedia Balls. Fore, M.D.                DATE OF BIRTH:  05/15/1951   DATE OF PROCEDURE:  04/13/2004  DATE OF DISCHARGE:                                 OPERATIVE REPORT   INDICATIONS FOR PROCEDURE:  Abnormal uterine bleeding, probable myomata.   POSTOPERATIVE DIAGNOSES:  Abnormal uterine bleeding, probable myomata,  pending pathology.   OPERATION:  Abnormal supracervical hysterectomy, excision of peritoneal  lesion.   ANESTHESIA:  General oral tracheal.   SURGEON:  Almedia Balls. Randell Patient, M.D.   FIRST ASSISTANT:  Leona Singleton, M.D.   INDICATIONS FOR PROCEDURE:  The patient is a 60 year old with the above  noted problems who has been counseled as to the need for surgery to treat  these problems. She has been fully counseled as to the nature of the  procedure and the risks involved to include risks of anesthesia, injury to  bowel, bladder, blood vessels, ureters, postoperative hemorrhage, infection,  recuperation, use of hormone replacement should her ovaries be removed. She  fully understands all these considerations and wishes to proceed on April 13, 2004.   FINDINGS:  On entry into the abdomen, the uterus was enlarged with multiple  myomata present.  Upper abdominal viscera were normal to palpation except  that there were a number of adhesions from the omentum to the peritoneal  surface around the gallbladder.  There was a smooth lesion present on the  peritoneal surface in the left lower quadrant approximately 1.5 cm in  greatest dimensions.   DESCRIPTION OF PROCEDURE:  With the patient under general anesthesia,  prepared and draped in the usual sterile fashion, with a Foley catheter in  the bladder, a lower abdominal transverse incision was made and carried  into  the peritoneal cavity without difficulty. A self retaining retractor was  placed, the bowel was packed off. Kelly clamps were used to clamp the  uterine ovarian anastomoses, tubes and round ligaments bilaterally for  traction and hemostasis.  The round ligaments were transected using Bovie  electrocoagulation and suture ligatures bilaterally with entry into the  retroperitoneal space and development of a bladder flap anteriorly.  Because  of the normal appearance of the ovaries, including a corpus luteum on the  right ovary, it was felt that these should be conserved.  Accordingly Heaney  clamps were placed across the uterine ovarian anastomoses and tubes  bilaterally with transection of these areas and double suture ligatures for  rendering these areas hemostatic.  Uterine vessels bilaterally were then  skeletonized, clamped, cut, and suture ligated with #1 chromic catgut.  The  cardinal ligaments bilaterally were then clamped, cut and suture ligated.  It was then possible  to excise the uterine fundus from the cervix using  Bovie electrocoagulation. The endocervical canal was coned out in reverse  fashion to provide for less possibility of hemorrhage and leukorrhea  following the procedure. The cervix was reapproximated and rendered  hemostatic with interrupted figure-of-eight sutures of #1 chromic catgut.  The area was lavaged with copious amounts of lactated Ringer's solution,  after noting that hemostasis was maintained followed Bovie application to  several small bleeders on the peritoneal edges, the bladder flap was brought  over the cervix for reperitonealization. The ovaries were suspended from the  round ligaments bilaterally using interrupted sutures of #1 chromic catgut.  At this point, with good hemostasis and a correct sponge and instrument  count, the peritoneum was closed with a continuous suture of #0 Vicryl.  The  fascia was closed with two sutures of 2-0 Vicryl which  were brought from the  lateral aspects of the incision and tied separately in the midline. The  subcutaneous fat was reapproximated with interrupted sutures of #0 Vicryl.  The skin was closed with subcuticular suture of 3-0 plain catgut. Estimated  blood loss 200 mL. The patient was taken to the recovery room in good  condition with clear urine and a Foley catheter tubing.  She will be placed  on 23 hour observation following surgery.                                               Almedia Balls. Randell Patient, M.D.    SRF/MEDQ  D:  04/13/2004  T:  04/14/2004  Job:  [id number]   cc:   Leona Singleton, M.D.  [address]  [place]  [place] [address]  Fax: [phone]

## 2011-05-11 NOTE — Discharge Summary (Signed)
NAME:  Kaylee Adams, PINHO                  ACCOUNT NO.:  [id number]   MEDICAL RECORD NO.:  [id number]          PATIENT TYPE:  OIB   LOCATION:  6527                         FACILITY:  [facility]   PHYSICIAN:  Arturo Morton. Riley Kill, M.D. Romualdo Bolk OF BIRTH:  12/23/1951   DATE OF ADMISSION:  11/27/2005  DATE OF DISCHARGE:  11/28/2005                                 DISCHARGE SUMMARY   ADDENDUM:   PRIMARY CARE PHYSICIAN:  Dr. Weyman Pedro.   CARDIOLOGIST:  Cecil Cranker, M.D.   Ms. Haltiwanger had a CT of the chest done on November 26, 2005 during this  hospitalization showing no evidence of pulmonary emboli or aortic  dissection.  However, there was an anterior superior mediastinal soft tissue  which may represent residual thymic disease which would be unusual in a  patient of this age.  Recommend limited CT follow-up in three or four months  or further evaluation.  Follow up with patient's primary care physician.      Dorian Pod, NP      Arturo Morton. Riley Kill, M.D. [facility]  Electronically Signed    MB/MEDQ  D:  11/28/2005  T:  11/28/2005  Job:  [id number]   cc:   Dr. Weyman Pedro  Mallie Darting, M.D.  [address]  [address]  [place]  [place] [address]

## 2011-05-11 NOTE — Consult Note (Signed)
NAME:  Kaylee Adams, Kaylee Adams                  ACCOUNT NO.:  [id number]   MEDICAL RECORD NO.:  [id number]          PATIENT TYPE:  INP   LOCATION:  A225                          FACILITY:  [facility]   PHYSICIAN:  Cecil Cranker, M.D.DATE OF BIRTH:  [birth year]   DATE OF CONSULTATION:  11/27/2005  DATE OF DISCHARGE:                                   CONSULTATION   REFERRING PHYSICIAN:  Margaretmary Dys, M.D.   REASON FOR CONSULTATION:  Ms. Nutting is a 60 year old female with past  medical history significant for hypertension and recently diagnosed  dyslipidemia who presented to her primary M.D. yesterday and reported  complaints of dyspnea on exertion and chest discomfort with exertion.  The  patient reports a 2-week history of dyspnea and chest discomfort while  walking approximately 200 feet from her office to her car after work.  She  states the chest discomfort and dyspnea resolved after several minutes of  rest.  She also reports unrelated, nonexertional right neck and jaw pain  that is intermittent and has been going on since prior to September 2006.  She denies any orthopnea, PND, syncope or presyncope.  She denies any  coughing or wheezing.   PAST MEDICAL HISTORY:  1.  Hypertension x6 years.  2.  Dyslipidemia diagnosed 2-3 weeks.  Diet and exercise recommended.   PAST SURGICAL HISTORY:  1.  Status post partial hysterectomy in April 2005.  2.  Stress test in 1999, at [facility] which was negative per her      report.   ALLERGIES:  PENICILLIN causes nausea.   MEDICATIONS:  1.  Diovan 80 mg daily.  2.  HCTZ unknown dose daily.  3.  Vitamin E daily.  4.  Garlic occasionally.  5.  Advil as needed.   CURRENT MEDICATIONS:  1.  Aspirin 81 mg daily.  2.  Lovenox 75 mg q.12h.  3.  Avapro 150 mg daily.   SOCIAL HISTORY:  Ms. Syring lives in [place] with her husband.  She works at  [employer].  She is married and has five children.  She denies any  tobacco abuse, alcohol use  or illicit drug use.  She has been doing some  stretching exercises and leg lifts, however, no cardiovascular exercises for  the last 1 month.   FAMILY HISTORY:  Mother is alive at 42 with hypertension.  Father is  deceased in his late 16s secondary to myocardial infarction.  She has 14  siblings and she believes one of her half sisters has heart disease.   REVIEW OF SYSTEMS:  CONSTITUTIONAL:  No fevers or chills.  Positive for  occasional headaches.  HEENT:  No vertigo or vision problems.  SKIN:  No  rashes or lesions.  CARDIOPULMONARY:  Per HPI.  GENITOURINARY:  No frequency  or dysuria.  NEUROLOGIC:  No weakness or numbness.  MUSCULOSKELETAL:  No  myalgias or arthralgias.  GASTROINTESTINAL:  No nausea, vomiting, diarrhea,  bright red blood per rectum or melena.  All other systems reviewed are  negative.   PHYSICAL EXAMINATION:  VITAL SIGNS:  Temperature 97,  pulse 68, respirations  20, blood pressure 139/66, weight 164.  GENERAL:  This is a well-developed, well-nourished female in no acute  distress.  HEENT:  Normocephalic, atraumatic.  Pupils equal round and reactive to  light.  Extraocular movements intact.  NECK:  No jugular venous distention.  Questionable referred murmur in  bilateral neck.  No lymphadenopathy is noted.  CARDIAC:  Regular rate and rhythm with normal S1, S2.  Very soft systolic  ejection murmur is noted.  LUNGS:  Clear to auscultation bilaterally without wheezes, rales or rhonchi.  SKIN:  Warm and dry with no rashes.  BREASTS:  Deferred.  ABDOMEN:  Soft, nontender with active bowel sounds.  GENITALIA:  Deferred.  RECTAL:  Deferred.  EXTREMITIES:  No clubbing, cyanosis or edema.  Distal pulses intact  bilaterally.  Bilateral femoral pulses intact.  No femoral bruits are noted.  MUSCULOSKELETAL:  No joint deformity.  NEUROLOGIC:  She is alert and oriented x3 and cranial nerves are grossly  intact.   LABORATORY DATA AND X-RAY FINDINGS:  Chest x-ray with no  acute  cardiopulmonary disease.  Heart size is upper limits of normal.  EKG reveals  sinus rhythm at 68 beats per minute with nonspecific ST abnormalities.  Abnormal R wave progression.  LVH by voltage.  Normal PR interval, QRS and  QTC.   White blood cells 4.7, hemoglobin 12.8, hematocrit 38.2, platelets 314.  Sodium 137, potassium 3.5, chloride 109, bicarb 27, BUN 9, creatinine 0.7,  glucose 91.  Total CK 426, MB 8.1, troponin 0.01.  Second set with total CK  372, MB 6.5, troponin 0.01.  ESR is 12.  ABG reveals pH 7.42, pCO2 39.5, pO2  99, bicarb 25, oxygen saturations 98% on room air.   IMPRESSION/RECOMMENDATIONS:  1.  This is a 60 year old female with typical symptoms of coronary ischemia      and several cardiac risk factors.  She does have a mildly elevated CK-MB      with normal troponin.  Will obtain a two-dimensional echocardiogram and      also transfer her to [facility] for further evaluation with      coronary angiography.  Will go ahead and start her on Plavix today.  2.  Hypertension, fairly well-controlled on her current medical regimen.      Will continue with Avapro.  3.  Dyslipidemia.  The patient has recently had labs from her primary care      physician, Dr. Raul Del, in [place], [place].  We will try to obtain      the results of these.   The patient was interviewed and examined by Dr. Glennon Hamilton.  He agrees to  the above assessment and plan.      Amy Mercy Riding, P.A. [facility]    ______________________________  E. Graceann Congress, M.D.    AB/MEDQ  D:  11/27/2005  T:  11/27/2005  Job:  [id number]

## 2011-05-11 NOTE — Discharge Summary (Signed)
NAME:  Kaylee Adams, Kaylee Adams                  ACCOUNT NO.:  [id number]   MEDICAL RECORD NO.:  [id number]          PATIENT TYPE:  INP   LOCATION:  A225                          FACILITY:  [facility]   PHYSICIAN:  Margaretmary Dys, M.D.DATE OF BIRTH:  Aug 12, 1951   DATE OF ADMISSION:  11/26/2005  DATE OF DISCHARGE:  12/05/2006LH                                 DISCHARGE SUMMARY   DISCHARGE DIAGNOSES:  1.  Unstable angina.  2.  Progressive dyspnea.  3.  Hypertension.   OTHER DIAGNOSIS:  Status post hysterectomy.   DISPOSITION:  The patient is being transferred to [facility] for  cardiac catheterization.   HOSPITAL COURSE:  Kindly review Dr. Christy Sartorius Medaiyese's history and  physical dictated on November 26, 2005 for details of the patient's  hospitalization.  The patient presented to the hospital with very typical  symptoms of coronary ischemia in the presence of multiple risk factors,  including hypertension and positive family history in father and half-  sisters.   She also had a very mildly elevated CK-MB with a normal troponin.   The patient was seen by cardiology, Dr. Graceann Congress, who recommended  urgent transfer to [facility] for further evaluation with coronary  angiography.   DISCHARGE MEDICATIONS:  1.  Aspirin 81 mg p.o. daily.  2.  Lovenox 75 mg subcutaneously q.12h.  3.  Avapro 150 mg p.o. daily.      Margaretmary Dys, M.D.  Electronically Signed     AM/MEDQ  D:  12/23/2005  T:  12/23/2005  Job:  [id number]

## 2011-05-11 NOTE — Discharge Summary (Signed)
NAME:  Kaylee Adams, EKSTEIN                            ACCOUNT NO.:  [id number]   MEDICAL RECORD NO.:  [id number]                   PATIENT TYPE:  OBV   LOCATION:  0453                                 FACILITY:  [facility]   PHYSICIAN:  Almedia Balls. Fore, M.D.                DATE OF BIRTH:  12-Jan-1951   DATE OF ADMISSION:  04/13/2004  DATE OF DISCHARGE:  04/14/2004                                 DISCHARGE SUMMARY   HISTORY:  The patient is a 60 year old with abnormal uterine bleeding,  pelvic pain, uterine enlargement for hysterectomy on April 13, 2004. The  remainder of the history and physical are as previously dictated. Laboratory  data includes preoperative hemoglobin of 12.3, normal BMET panel, normal  EKG, and normal chest x-ray.   HOSPITAL COURSE:  The patient was taken to the operating room on April 13, 2004, at which time abdominal supracervical hysterectomy and excision of a  peritoneal lesion were performed. The patient did well postoperatively. Diet  and ambulation progressed over the evening of April 13, 2004, and in the  early morning of April 14, 2004. On the morning of April 14, 2004, she was  afebrile and voiding without difficulty. It was felt that she could be  discharged at this time.   FINAL DIAGNOSES:  1. Abnormal uterine bleeding.  2. Uterine enlargement.  3. Pelvic pain.  4. Probable myomata.   OPERATION:  Abdominal supracervical hysterectomy, excision of peritoneal  lesion.  Pathology report unavailable at the time of dictation.   DISPOSITION:  Discharged home to return to the office in two weeks for  followup. She was instructed to gradually progress her activities over  several weeks at home and to limit lifting and driving for two weeks.  She  was fully ambulatory, on a regular diet, and in good condition at the time  of discharge. She is given prescriptions for [facility], #30, to be  taking one or two q.6h. p.r.n. pain and doxycycline 100 mg, #12, to be  taking one b.i.d.                                               Almedia Balls. Randell Patient, M.D.    SRF/MEDQ  D:  04/14/2004  T:  04/14/2004  Job:  [id number]

## 2011-05-11 NOTE — H&P (Signed)
NAME:  Kaylee Adams, Kaylee Adams NO.:  [id number]   MEDICAL RECORD NO.:  [id number]                   PATIENT TYPE:   LOCATION:                                       FACILITY:   PHYSICIAN:  Almedia Balls. Fore, M.D.                DATE OF BIRTH:   DATE OF ADMISSION:  04/13/2004  DATE OF DISCHARGE:                                HISTORY & PHYSICAL   CHIEF COMPLAINT:  Fibroids.   HISTORY:  The patient is a 60 year old gravida 5, para 5, whose last  menstrual period was March 28, 2004.  She has been seen in our office over  several years, but most recently in March 2005, at which time a Pap smear  was done which was normal.  She has a history of a secretory endometrial  biopsy.  Examination at the visit in March revealed uterus to be enlarged,  approximately 16 to [redacted] weeks gestational size and firm.  Ultrasound was  performed which showed uterus to be enlarged with probable multiple myomata  with normal appearing endometrial stripe.  She also had a simple appearing  right ovarian cyst present which was tender.  She was counseled as to her  problems and the need for surgery to evaluate these problems.  She opted to  have hysterectomy and possible bilateral salpingo-oophorectomy.  She is  admitted for this procedure at this time.  This procedure has been totally  described to her, to include the procedure itself, risks involving  anesthesia, injury to bowel, bladder, blood vessels, ureters, postoperative  hemorrhage, infection, and recuperation, as well as hormone replacement  should her ovaries be removed.  She fully understands all of these  considerations and wishes to proceed on April 13, 2004.   PAST MEDICAL HISTORY:  1. Abnormal Pap smear in 1973, at which time cryo surgery was done.  Pap     smears have been normal since then.  2. She has mild hypertension for which she takes medication.  3. She has recently been on methylprednisolone 4 mg Dosepak for seasonal    allergies.  She has completed this as of April 09, 2004.   ALLERGIES:  PENICILLIN.   SOCIAL HISTORY:  She is a nonsmoker and nondrinker.   FAMILY HISTORY:  Sister with breast cancer and several aunts with diabetes  mellitus.   REVIEW OF SYSTEMS:  HEENT:  Negative.  CARDIORESPIRATORY:  As noted above.  GASTROINTESTINAL:  Negative.  GENITOURINARY:  As noted above.  NEUROMUSCULAR:  Negative.   PHYSICAL EXAMINATION:  VITAL SIGNS:  Height 5 feet 3/4 inches tall, 167  pounds, blood pressure 124/78, pulse 76, respirations 18.  GENERAL:  A well-developed black female in no acute distress.  HEENT:  Within normal limits.  NECK:  Supple without masses, adenopathy, or bruits.  HEART:  Regular rate and rhythm without murmurs.  LUNGS:  Clear to auscultation and  percussion.  BREASTS:  Sitting and laying without mass.  ABDOMEN:  Flat and soft with slight mass effect in the lower abdomen above  the symphysis pubis.  This is somewhat tender.  PELVIC:  External genitalia, Bartholin's, urethra, and Skene glands within  normal limits.  Cervix is slightly inflamed.  Uterus is anterior mid in  position, approximately 16 to [redacted] weeks gestational size.  There is  tenderness throughout, but particularly towards the right.  Adnexal areas  are not well palpated.  Rectovaginal examination is confirmatory.  EXTREMITIES:  Within normal limits.  NEUROLOGIC:  Grossly intact.  SKIN:  No suspicious lesions.   IMPRESSION:  1. 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  2. Ovarian cyst.  3. Pelvic pain.   DISPOSITION:  As noted above.                                               Almedia Balls. Randell Patient, M.D.    SRF/MEDQ  D:  04/10/2004  T:  04/10/2004  Job:  [id number]

## 2011-05-11 NOTE — Procedures (Signed)
NAME:  Kaylee Adams, Kaylee Adams NO.:  [id number]   MEDICAL RECORD NO.:  [id number]          PATIENT TYPE:  INP   LOCATION:  A225                          FACILITY:  [facility]   PHYSICIAN:  Charlton Haws, M.D.     DATE OF BIRTH:  May 28, 1951   DATE OF PROCEDURE:  11/27/2005  DATE OF DISCHARGE:                                  ECHOCARDIOGRAM   A 2-D echocardiogram.   INDICATIONS:  Dyspnea, chest pain, hypertension.   Left ventricular cavity size was normal. There was mild LVH. EF was 60%.  There were no regional wall motion abnormalities. Left atrium and right-  sided cardiac chambers were normal. There was no evidence of pulmonary  hypertension. Mitral valve was structurally normal. Aortic valve was  trileaflet and minimally sclerotic. There was no aortic insufficiency.  Aortic root was normal size. Subcostal imaging revealed no atrial or septal  defect, no source of embolus and no effusion.   FINAL IMPRESSION:  1.  Mild left ventricular hypertrophy, ejection fraction of 60%.  2.  Normal mitral valve.  3.  Normal right ventricular and right atrial with no evidence of pulmonary      hypertension.  4.  Aortic valve sclerosis.  5.  No pericardial effusion.           ______________________________  Charlton Haws, M.D.     PN/MEDQ  D:  11/28/2005  T:  11/29/2005  Job:  [id number]

## 2011-06-19 ENCOUNTER — Encounter: Payer: Self-pay | Admitting: Internal Medicine

## 2011-06-25 ENCOUNTER — Telehealth: Payer: Self-pay | Admitting: *Deleted

## 2011-06-25 NOTE — Telephone Encounter (Signed)
Error

## 2011-06-26 ENCOUNTER — Other Ambulatory Visit: Payer: Self-pay | Admitting: Internal Medicine

## 2011-06-26 LAB — LIPID PANEL
Cholesterol: 236 mg/dL — ABNORMAL HIGH (ref 0–200)
LDL Cholesterol: 163 mg/dL — ABNORMAL HIGH (ref 0–99)
VLDL: 19 mg/dL (ref 0–40)

## 2011-07-02 ENCOUNTER — Encounter: Payer: Self-pay | Admitting: Internal Medicine

## 2011-07-02 ENCOUNTER — Ambulatory Visit (INDEPENDENT_AMBULATORY_CARE_PROVIDER_SITE_OTHER): Payer: Self-pay | Admitting: Internal Medicine

## 2011-07-02 VITALS — BP 140/80 | HR 78 | Wt 163.0 lb

## 2011-07-02 DIAGNOSIS — I10 Essential (primary) hypertension: Secondary | ICD-10-CM

## 2011-07-02 DIAGNOSIS — I2510 Atherosclerotic heart disease of native coronary artery without angina pectoris: Secondary | ICD-10-CM

## 2011-07-02 DIAGNOSIS — H9209 Otalgia, unspecified ear: Secondary | ICD-10-CM

## 2011-07-02 DIAGNOSIS — E785 Hyperlipidemia, unspecified: Secondary | ICD-10-CM

## 2011-07-02 MED ORDER — LOSARTAN POTASSIUM-HCTZ 100-25 MG PO TABS: 1.0000 | ORAL_TABLET | Freq: Every day | ORAL | Status: DC

## 2011-07-02 NOTE — Patient Instructions (Signed)
Continue lifestyle intervention healthy eating and exercise . Will contact you about ENT check for your jaw ear pain. Also nutrition referral to help get your cholesterol down. Your LDL goal should be  Below  100    Although at least below 130 would be helpful.   statin medication as we discussed  may reduce our risk of heart attack and stroke. Check up with full set of labs in 6 months or as needed.

## 2011-07-02 NOTE — Progress Notes (Signed)
  Subjective:    Patient ID: Kaylee Adams, female    DOB: 03/25/1951, 60 y.o.   MRN: [id number]  HPI Patient comes in today for followup of elevated lipids and hypertension. Since her last visit she has been walking regularly and minimizing her red meat. Denies chest pain shortness of breath. She is hesitant to take a statin medicine but she doesn't want to do with side effects. She is taking aspirin about 3 times a week but often forgets it. She does take her blood pressure medicine regularly.   In the last 4 or 5 months she is having some problems with Jaw and head pain  . He is near her jaw on the left side sometimes worse with chewing but on the right feels like there is fluid in her ears. Sometimes gets intermittent sore throats. Her dentist thought that she could have TMJ when she was seen in  February. .  She does not feel she grits her teeth. No treatment given.   Review of Systems Negative for bleeding significant GI GU problems vision problems hearing seems okay.  Past history family history social history reviewed in the electronic medical record.     Objective:   Physical Exam Physical Exam: Vital signs reviewed JYN:WGNF is a well-developed well-nourished alert cooperative  AA female who appears her stated age in no acute distress.  HEENT: normocephalic  traumatic , Eyes: PERRL EOM's full, conjunctiva clear, Nares: paten,t no deformity discharge or tenderness., Ears: no deformity EAC's clear TMs with normal landmarks. Mouth: clear OP, no lesions, edema.  Moist mucous membranes. Dentition in adequate repair. NECK: supple without masses, thyromegaly or bruits. CHEST/PULM:  Clear to auscultation and percussion breath sounds equal no wheeze , rales or rhonchi. No chest wall deformities or tenderness. CV: PMI is nondisplaced, S1 S2 no gallops, murmurs, rubs. Peripheral pulses are full without delay.No JVD .  ABDOMEN: Bowel sounds normal nontender  No guard or rebound, no hepato  splenomegal no CVA tenderness. NEURO:  Oriented x3, cranial nerves 3-12 appear to be intact, no obvious focal weakness,gait within normal limits SKIN: No acute rashes normal turgor, color, no bruising or petechiae. PSYCH: Oriented, good eye contact, no obvious depression anxiety, cognition and judgment appear normal.  Labs reviewed       Assessment & Plan:  Hyperlipidemia Non critical    CAD hx  Doesn't really want to take a statin counseled again about advantage and prevention. She ought to at least get her LDL down to the 130 below range if not the 100 range SHe is agreeable to seeing a nutritionist.  Jaw /ear pain unclear cause  Ear vs other  Will get ent to evaluate .Marland Kitchen HT slight  Up today continue healthy ls

## 2011-07-02 NOTE — Assessment & Plan Note (Signed)
Doesn't really want to take a statin counseled again about advantage and prevention. She ought to at least get her LDL down to the 130 below range if not the 100 range SHe is agreeable to seeing a nutritionist.

## 2011-07-16 ENCOUNTER — Encounter: Payer: Self-pay | Admitting: Internal Medicine

## 2011-07-16 ENCOUNTER — Ambulatory Visit (INDEPENDENT_AMBULATORY_CARE_PROVIDER_SITE_OTHER): Payer: 59 | Admitting: Internal Medicine

## 2011-07-16 VITALS — BP 158/88 | HR 78 | Temp 98.3°F | Wt 164.0 lb

## 2011-07-16 DIAGNOSIS — IMO0002 Reserved for concepts with insufficient information to code with codable children: Secondary | ICD-10-CM

## 2011-07-16 DIAGNOSIS — H9209 Otalgia, unspecified ear: Secondary | ICD-10-CM

## 2011-07-16 DIAGNOSIS — L723 Sebaceous cyst: Secondary | ICD-10-CM

## 2011-07-16 DIAGNOSIS — L729 Follicular cyst of the skin and subcutaneous tissue, unspecified: Secondary | ICD-10-CM | POA: Insufficient documentation

## 2011-07-16 NOTE — Progress Notes (Signed)
  Subjective:    Patient ID: Truddie Hidden, female    DOB: 1951/02/13, 60 y.o.   MRN: [id number]  HPI  Comes in today for acute visit . She is concerned about an area on her left shoulder supraclavicular area. She has had what was like a blackhead there for quite a while that was small and she could squeeze stuff out of it. However if the last 3 weeks to 4 weeks he's noticed increasing in size and some tenderness underneath it without associated redness or fever. This is of concern to her because she has been having problems with left ear jaw and neck pain also.  No fever did see her dentist today who thought that her symptoms could be consistent with TMJ.     Review of Systems Negative chest pain shortness of breath fever or adenopathy. Ears pop at times.    Objective:   Physical Exam Well-developed well-nourished in no acute distress but looks a bit worried.  Left EAC mild wax TM okay he no external tenderness. Neck supple no obvious adenopathy. Left supraclavicular area no obvious adenopathy. Skin  in the left supraclavicular area has what looks like an overgrown plugged  blackhead-type area with 2 cm nodular nonfluctuant cystic lesion. It is not red nor specifically tender but complains of tenderness in the area.No acute rashes      Assessment & Plan:  Skin ? Cyst    A bit atypical   With neck and jawpain prob  Not related   derm to see   advisability about   bx /removal  Or other  intervention

## 2011-07-16 NOTE — Patient Instructions (Addendum)
Try warm compresses i n the meantime  And Derm will see you in 2 days.  Dont think related to your ear pain .

## 2011-09-14 LAB — COMPREHENSIVE METABOLIC PANEL
Albumin: 4.3
Alkaline Phosphatase: 50
BUN: 13
Chloride: 107
Creatinine, Ser: 0.73
Glucose, Bld: 115 — ABNORMAL HIGH
Total Bilirubin: 0.7
Total Protein: 7.3

## 2011-09-14 LAB — CBC
HCT: 42.5
Hemoglobin: 14.3
MCV: 78.8
Platelets: 280
RDW: 13.8

## 2011-09-14 LAB — DIFFERENTIAL
Basophils Absolute: 0.1
Basophils Relative: 1
Lymphocytes Relative: 3 — ABNORMAL LOW
Monocytes Absolute: 0.4
Neutro Abs: 6.5
Neutrophils Relative %: 91 — ABNORMAL HIGH

## 2011-09-14 LAB — URINALYSIS, ROUTINE W REFLEX MICROSCOPIC
Leukocytes, UA: NEGATIVE
Nitrite: NEGATIVE
Protein, ur: 100 — AB
Specific Gravity, Urine: 1.022
Urobilinogen, UA: 0.2

## 2011-09-14 LAB — URINE MICROSCOPIC-ADD ON

## 2011-09-28 LAB — POCT CARDIAC MARKERS
CKMB, poc: 6.7 ng/mL (ref 1.0–8.0)
Myoglobin, poc: 159 ng/mL (ref 12–200)
Troponin i, poc: <0.05 ng/mL (ref 0.00–0.09)

## 2011-09-28 LAB — CBC
HCT: 41.8 % (ref 36.0–46.0)
Hemoglobin: 13.7 g/dL (ref 12.0–15.0)
RBC: 5.25 MIL/uL — ABNORMAL HIGH (ref 3.87–5.11)
WBC: 6.2 10*3/uL (ref 4.0–10.5)

## 2011-09-28 LAB — DIFFERENTIAL
Basophils Absolute: 0 10*3/uL (ref 0.0–0.1)
Eosinophils Relative: 0 % (ref 0–5)
Lymphocytes Relative: 43 % (ref 12–46)
Lymphs Abs: 2.7 10*3/uL (ref 0.7–4.0)
Monocytes Absolute: 0.1 10*3/uL (ref 0.1–1.0)
Monocytes Relative: 2 % — ABNORMAL LOW (ref 3–12)
Neutro Abs: 3.3 10*3/uL (ref 1.7–7.7)

## 2011-09-28 LAB — APTT: aPTT: 31 seconds (ref 24–37)

## 2011-09-28 LAB — BASIC METABOLIC PANEL
Calcium: 10.3 mg/dL (ref 8.4–10.5)
GFR calc Af Amer: >60 mL/min (ref >60)
GFR calc non Af Amer: >60 mL/min (ref >60)
Glucose, Bld: 85 mg/dL (ref 70–99)
Potassium: 3.7 mEq/L (ref 3.5–5.1)
Sodium: 140 mEq/L (ref 135–145)

## 2011-12-16 ENCOUNTER — Encounter (HOSPITAL_COMMUNITY): Payer: Self-pay | Admitting: Cardiology

## 2011-12-16 ENCOUNTER — Emergency Department (HOSPITAL_COMMUNITY)
Admission: EM | Admit: 2011-12-16 | Discharge: 2011-12-16 | Disposition: A | Discharge status: Home / Self Care | Payer: No Typology Code available for payment source | Attending: Emergency Medicine | Admitting: Emergency Medicine

## 2011-12-16 ENCOUNTER — Emergency Department (HOSPITAL_COMMUNITY): Payer: No Typology Code available for payment source

## 2011-12-16 DIAGNOSIS — S139XXA Sprain of joints and ligaments of unspecified parts of neck, initial encounter: Secondary | ICD-10-CM | POA: Insufficient documentation

## 2011-12-16 DIAGNOSIS — S161XXA Strain of muscle, fascia and tendon at neck level, initial encounter: Secondary | ICD-10-CM

## 2011-12-16 MED ORDER — CYCLOBENZAPRINE HCL 10 MG PO TABS: 10.0000 mg | ORAL_TABLET | Freq: Three times a day (TID) | ORAL | Status: AC | PRN

## 2011-12-16 MED ORDER — CYCLOBENZAPRINE HCL 10 MG PO TABS: 10.0000 mg | ORAL_TABLET | Freq: Three times a day (TID) | ORAL | Status: DC | PRN

## 2011-12-16 MED ORDER — HYDROCODONE-ACETAMINOPHEN 5-325 MG PO TABS: 1.0000 | ORAL_TABLET | Freq: Four times a day (QID) | ORAL | Status: AC | PRN

## 2011-12-16 NOTE — ED Provider Notes (Signed)
Medical screening examination/treatment/procedure(s) were performed by non-physician practitioner and as supervising physician I was immediately available for consultation/collaboration.  Nicholes Stairs, MD 12/16/11 1651
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
## 2011-12-28 ENCOUNTER — Telehealth: Payer: Self-pay | Admitting: Family Medicine

## 2011-12-28 NOTE — Telephone Encounter (Signed)
Pulled from Triage vmail. Pt had wellness exam with labs last month. Has an appt with WP on Monday. Wants to know if she needs more labs/does she need to fast? Please call.

## 2011-12-28 NOTE — Telephone Encounter (Signed)
Pt still needs to come in fasting just in case all the labs didn't get done and bring a copy of the labs with her. Pt aware of this.

## 2011-12-31 ENCOUNTER — Ambulatory Visit (INDEPENDENT_AMBULATORY_CARE_PROVIDER_SITE_OTHER): Payer: 59 | Admitting: Internal Medicine

## 2011-12-31 ENCOUNTER — Encounter: Payer: Self-pay | Admitting: Internal Medicine

## 2011-12-31 VITALS — BP 140/80 | HR 78 | Ht 60.0 in | Wt 160.0 lb

## 2011-12-31 DIAGNOSIS — R252 Cramp and spasm: Secondary | ICD-10-CM

## 2011-12-31 DIAGNOSIS — E785 Hyperlipidemia, unspecified: Secondary | ICD-10-CM

## 2011-12-31 DIAGNOSIS — I10 Essential (primary) hypertension: Secondary | ICD-10-CM

## 2011-12-31 DIAGNOSIS — Z0000 Encounter for general adult medical examination without abnormal findings: Secondary | ICD-10-CM

## 2011-12-31 DIAGNOSIS — L608 Other nail disorders: Secondary | ICD-10-CM

## 2011-12-31 DIAGNOSIS — L602 Onychogryphosis: Secondary | ICD-10-CM

## 2011-12-31 MED ORDER — CYCLOBENZAPRINE HCL 10 MG PO TABS: 10.0000 mg | ORAL_TABLET | Freq: Three times a day (TID) | ORAL | Status: AC | PRN

## 2011-12-31 NOTE — Patient Instructions (Signed)
lifestyle intervention healthy eating and exercise . To help BP weight and lipids. Recheck in 6 months with labs pre visit  .   Consider  seeing Dr Charlynn Court  Podiatry bout your feet. Hypercholesterolemia High Blood Cholesterol Cholesterol is a white, waxy, fat-like protein needed by your body in small amounts. The liver makes all the cholesterol you need. It is carried from the liver by the blood through the blood vessels. Deposits (plaque) may build up on blood vessel walls. This makes the arteries narrower and stiffer. Plaque increases the risk for heart attack and stroke. You cannot feel your cholesterol level even if it is very high. The only way to know is by a blood test to check your lipid (fats) levels. Once you know your cholesterol levels, you should keep a record of the test results. Work with your caregiver to to keep your levels in the desired range. WHAT THE RESULTS MEAN:  Total cholesterol is a rough measure of all the cholesterol in your blood.   LDL is the so-called bad cholesterol. This is the type that deposits cholesterol in the walls of the arteries. You want this level to be low.   HDL is the good cholesterol because it cleans the arteries and carries the LDL away. You want this level to be high.   Triglycerides are fat that the body can either burn for energy or store. High levels are closely linked to heart disease.  DESIRED LEVELS:  Total cholesterol below 200.   LDL below 100 for people at risk, below 70 for very high risk.   HDL above 50 is good, above 60 is best.   Triglycerides below 150.  HOW TO LOWER YOUR CHOLESTEROL:  Diet.   Choose fish or white meat chicken and [place], roasted or baked. Limit fatty cuts of red meat, fried foods, and processed meats, such as sausage and lunch meat.   Eat lots of fresh fruits and vegetables. Choose whole grains, beans, pasta, potatoes and cereals.   Use only small amounts of olive, corn or canola oils. Avoid  butter, mayonnaise, shortening or palm kernel oils. Avoid foods with trans-fats.   Use skim/nonfat milk and low-fat/nonfat yogurt and cheeses. Avoid whole milk, cream, ice cream, egg yolks and cheeses. Healthy desserts include angel food cake, gingersnaps, animal crackers, hard candy, popsicles, and low-fat/nonfat frozen yogurt. Avoid pastries, cakes, pies and cookies.   Exercise.   A regular program helps decrease LDL and raises HDL.   Helps with weight control.   Do things that increase your activity level like gardening, walking, or taking the stairs.   Medication.   May be prescribed by your caregiver to help lowering cholesterol and the risk for heart disease.   You may need medicine even if your levels are normal if you have several risk factors.  HOME CARE INSTRUCTIONS   Follow your diet and exercise programs as suggested by your caregiver.   Take medications as directed.   Have blood work done when your caregiver feels it is necessary.  MAKE SURE YOU:   Understand these instructions.   Will watch your condition.   Will get help right away if you are not doing well or get worse.  Document Released: 12/10/2005 Document Revised: 08/22/2011 Document Reviewed: 05/28/2007 [facility] Patient Information 2012 [place], [place].
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
## 2012-02-19 ENCOUNTER — Telehealth (HOSPITAL_COMMUNITY): Payer: Self-pay | Admitting: Dietician

## 2012-02-19 NOTE — Telephone Encounter (Signed)
Pt reports she was referred by [facility] Cranford Mon, RN Case Manager). She reports she had her initial visit yesterday. She was been diagnosed with HTN and high cholesterol. Appointment set up for 02/25/12 at 8:00 AM.

## 2012-02-25 ENCOUNTER — Encounter (HOSPITAL_COMMUNITY): Payer: Self-pay | Admitting: Dietician

## 2012-02-25 NOTE — Progress Notes (Signed)
Outpatient Initial Nutrition Assessment  Date:02/25/2012   Time: 8:00 AM  Referring Physician: [facility] Reason for Visit: HTN, high cholesterol  Nutrition Assessment:  Ht: Height: 5' (152.4 cm)   Wt:Weight: 159 lb (72.122 kg)   IBW: 100# %IBW: 159% UBW: 160# %UBW: 100% BMI: Body mass index is 31.05 kg/(m^2).  Goal Weight: 143% (10% weight loss) Weight hx: Pt reports highest weight of 160#, which she has been maintaining. Her lowest weight was at age 61, when she was 105#.   Estimated nutritional needs: 1481-1616 kcals daily, 58-72 grams protein daily, 1.5-1.6 L fluid daily  PMH:  Past Medical History  Diagnosis Date  . HLD (hyperlipidemia)   . HTN (hypertension)   . CAD (coronary artery disease)     30% LAD lesion on cath   . Hepatic cyst     on ct [place]    Medications:  Current Outpatient Rx  Name Route Sig Dispense Refill  . AMLODIPINE BESYLATE 2.5 MG PO TABS Oral Take 2.5 mg by mouth daily.      Marland Kitchen CALCIUM-VITAMIN D 500-200 MG-UNIT PO TABS Oral Take 1 tablet by mouth 2 (two) times daily with a meal.    . LOSARTAN POTASSIUM-HCTZ 100-25 MG PO TABS Oral Take 1 tablet by mouth daily. 30 tablet 12  . ADULT MULTIVITAMIN W/MINERALS CH Oral Take 1 tablet by mouth daily.    . ASPIRIN 81 MG PO CHEW Oral Chew 81 mg by mouth daily. UAD     . VITAMIN E PO Oral Take by mouth.      Labs: CMP     Component Value Date/Time   NA 135 03/19/2009 1330   K 4.2 10/13/2010   CL 99 03/19/2009 1330   CO2 29 03/19/2009 1330   GLUCOSE 98 10/13/2010   GLUCOSE 88 03/19/2009 1330   BUN 12 03/19/2009 1330   CREATININE 0.8 10/13/2010   CALCIUM 9.7 03/19/2009 1330   PROT 7.4 03/19/2009 1330   ALBUMIN 3.9 03/19/2009 1330   AST 20 09/22/2009 2032   ALT 34 03/19/2009 1330   ALKPHOS 61 03/19/2009 1330   BILITOT 0.5 03/19/2009 1330   GFRNONAA >60 03/19/2009 1330   GFRAA  Value: >60        The eGFR has been calculated using the MDRD equation. This calculation has not been validated in all clinical situations. eGFR's  persistently <60 mL/min signify possible Chronic Kidney Disease. 03/19/2009 1330    Lipid Panel     Component Value Date/Time   CHOL 236* 06/26/2011 0830   TRIG 95 06/26/2011 0830   HDL 54 06/26/2011 0830   CHOLHDL 4.4 06/26/2011 0830   VLDL 19 06/26/2011 0830   LDLCALC 163* 06/26/2011 0830     Lab Results  Component Value Date   HGBA1C 5.7 02/12/2008   Lab Results  Component Value Date   LDLCALC 163* 06/26/2011   CREATININE 0.8 10/13/2010     Lifestyle/ social habits: Ms. Poplin lives in [place] with her husband. She has 5 adult children, 4 of which live in the [place] area. 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 She reports a stress level of 6-7, mainly due to the nature of her job, but she is trying to work on "letting things go". She reveals that her faith is very important to her and keeps her grounded.   Nutrition hx/habits: Ms. Vogelgesang reports that she follows a healthy diet. She reports no changes to her diet recently. She reports limited red meat  intake, consuming mostly fish and chicken. She eats a lot of fruit. She rarely eats bread. She drinks mostly water. She does the cooking and grocery shopping for her household. She has cut back on snacking at night. She now tries not to eat after 7 PM. She eats out once a week with her church group; she mainly frequents [employer] and [employer], but tries to select healthy options. She goes on her air walker every morning and is looking forward to walking more when the weather is nicer. Pt drinks water throughout the day.   Diet recall: 5 AM: water, coffee with cream and sugar; Breakfast (8 AM): oatmeal, banana or apple; Lunch: green beans, potatoes, grilled chicken, water; Dinner: leftovers (similar to lunch); Snack: piece of chicken or fruit  Nutrition Diagnosis: Nutrition-;reated knowledge deficit r/t heart healthy diet principles AEB pt with multiple diet-related questions.   Nutrition Intervention: Nutrition rx: 1200  kcal NAS, no added sugar, heart healthy diet; 3 meals daily; low-calorie beverages only; 30 minutes physical activity daily  Education/Counseling Provided: Educated pt on plate method and heart healthy diet principles. Discussed importance for increased fiber and fruit and vegetable intake. Reviewed heart healthy cooking and preparation methods. Discussed importance of regular exercise regimen. Provided handouts from the Academy of Nutrition and Dietetics re: heart healthy nutrition.  Understanding, Motivation, Ability to Follow Recommendations: Expect good compliance.   Monitoring and Evaluation: Goals: 1) 1-2# weight loss per week; 2) 30 minutes physical activity daily  Recommendations: 1) For weight loss: [phone] kcals daily; 2) Break up exercise into smaller, more frequent sessions  F/U: PRN. Provided RD contact information. Pt reported she would like to contact for follow-up appointment in the future.   Orlene Plum, RD  02/25/2012  Time: 8:00 AM

## 2012-05-08 ENCOUNTER — Ambulatory Visit (INDEPENDENT_AMBULATORY_CARE_PROVIDER_SITE_OTHER): Payer: 59 | Admitting: Internal Medicine

## 2012-05-08 ENCOUNTER — Encounter: Payer: Self-pay | Admitting: Internal Medicine

## 2012-05-08 VITALS — BP 108/80 | HR 75 | Temp 98.4°F | Wt 161.0 lb

## 2012-05-08 DIAGNOSIS — I2510 Atherosclerotic heart disease of native coronary artery without angina pectoris: Secondary | ICD-10-CM

## 2012-05-08 DIAGNOSIS — G479 Sleep disorder, unspecified: Secondary | ICD-10-CM | POA: Insufficient documentation

## 2012-05-08 DIAGNOSIS — I10 Essential (primary) hypertension: Secondary | ICD-10-CM

## 2012-05-08 DIAGNOSIS — G478 Other sleep disorders: Secondary | ICD-10-CM

## 2012-05-08 DIAGNOSIS — R0789 Other chest pain: Secondary | ICD-10-CM

## 2012-05-08 DIAGNOSIS — M79609 Pain in unspecified limb: Secondary | ICD-10-CM

## 2012-05-08 DIAGNOSIS — M7062 Trochanteric bursitis, left hip: Secondary | ICD-10-CM | POA: Insufficient documentation

## 2012-05-08 DIAGNOSIS — M79606 Pain in leg, unspecified: Secondary | ICD-10-CM

## 2012-05-08 HISTORY — DX: Other chest pain: R07.89

## 2012-05-08 MED ORDER — ESZOPICLONE 2 MG PO TABS: 2.0000 mg | ORAL_TABLET | Freq: Every day | ORAL | Status: DC

## 2012-05-08 NOTE — Patient Instructions (Signed)
Unsure why you have your sx but your exam is good today.  Will get opinion from cardiology .  In the meantime  Contact us of worsening sx or concerns.

## 2012-05-08 NOTE — Progress Notes (Signed)
Subjective:    Patient ID: Kaylee Adams, female    DOB: [birth year], 61 y.o.   MRN: [id number]  HPI Patient comes in today for an acute visit while but she calls weakness attacks. She used to have minimal long time ago but hasn't had any recently until yesterday. She calls it a weakness in her chest and points to the mid lower chest. This is not associated with coughing wheezing syncope dyspnea on exertion or edema. I last for hours unsure what this is . She wants to get checked out because she's worried about it. She has also had a two-week history of Left leg pain  A deep ache.  Thigh   Comes and goes not better. No injury ocass ibu. No  aggravation Review of Systems No fever vomiting  No syncope.   No heart burn   no pleurisy hemoptysis change in GI GU.  She has been having a hard time sleeping and asks about a sleep aid sometimes wakes up at 2 AM no snoring change in physical status. Last night she did not sleep well.   Past history family history social history reviewed in the electronic medical record.  She has seen Dr. Tenny Craw in the past she has a history of atypical chest pain but this is different  hasn't seen her in 1 or 2 years. Outpatient Encounter Prescriptions as of 05/08/2012  Medication Sig Dispense Refill  . aspirin 81 MG chewable tablet Chew 81 mg by mouth daily. UAD       . Calcium Carbonate-Vitamin D (CALCIUM-VITAMIN D) 500-200 MG-UNIT per tablet Take 1 tablet by mouth 2 (two) times daily with a meal.      . losartan-hydrochlorothiazide (HYZAAR) 100-25 MG per tablet Take 1 tablet by mouth daily.  30 tablet  12  . Multiple Vitamin (MULITIVITAMIN WITH MINERALS) TABS Take 1 tablet by mouth daily.      Marland Kitchen amLODipine (NORVASC) 2.5 MG tablet Take 2.5 mg by mouth daily.        Marland Kitchen VITAMIN E PO Take by mouth.           Objective:   Physical Exam  BP 108/80  Pulse 75  Temp(Src) 98.4 F (36.9 C) (Oral)  Wt 161 lb (73.029 kg)  SpO2 95% wdwn in nad HEENT grossly normal tonge  midline Neck: Supple without adenopathy or masses or bruits Chest:  Clear to A&P without wheezes rales or rhonchi CV:  S1-S2 no gallops or murmurs peripheral perfusion is normal Abdomen:  Sof,t normal bowel sounds without hepatosplenomegaly, no guarding rebound or masses no CVA tenderness No clubbing cyanosis or edema MS no join swelling rom good non tender leg no cords or warmth.   EKG NSR  First showed limb lead  reversal    Assessment & Plan:  Fatigue spells associated with  Mid chest area ( tired in the chest.)  Uncertainty etiology. Because of her past history and risk and the fact that this comes and spells I will have cardiology to see her.  Ht  blood pressure reading on the low side today check her readings at home  Leg pain doesn't seem vascular  ? Ms / radiation  Nonspecific exam ok today   Sleep disturbance intermittent unclear if this could be stress or other discuss risk  benefit of medication do not suggest long-term I did give her samples of Lunesta 2 mg #6 with handout not a long-term solution.  Can fu here if neg eval and persistent sx .

## 2012-05-12 ENCOUNTER — Encounter: Payer: Self-pay | Admitting: Nurse Practitioner

## 2012-05-12 ENCOUNTER — Ambulatory Visit (INDEPENDENT_AMBULATORY_CARE_PROVIDER_SITE_OTHER): Payer: 59 | Admitting: Nurse Practitioner

## 2012-05-12 VITALS — BP 138/80 | HR 80 | Ht 61.0 in | Wt 162.0 lb

## 2012-05-12 DIAGNOSIS — R072 Precordial pain: Secondary | ICD-10-CM

## 2012-05-12 DIAGNOSIS — R0789 Other chest pain: Secondary | ICD-10-CM

## 2012-05-12 DIAGNOSIS — R079 Chest pain, unspecified: Secondary | ICD-10-CM

## 2012-05-12 DIAGNOSIS — R011 Cardiac murmur, unspecified: Secondary | ICD-10-CM

## 2012-05-12 NOTE — Progress Notes (Signed)
Patient Name: Kaylee Adams Date of Encounter: 05/12/2012  Primary Care Provider:  Lorretta Harp, MD, MD Primary Cardiologist:  Lovina Reach, MD  Patient Profile  61 year old female with history of nonobstructive CAD who presents secondary to chest pain.  Problem List   Past Medical History  Diagnosis Date  . HLD (hyperlipidemia)   . HTN (hypertension)   . CAD (coronary artery disease)     a. 11/2005 Cath: nonobs dzs, 30% LAD lesion on cath ;  b. 11/2005 Echo: NL EF  . Hepatic cyst     on ct [place]  . Midsternal chest pain    Past Surgical History  Procedure Date  . Vesicovaginal fistula closure w/ tah     for fibroid tumors  . Breast biopsy   . Cholecystectomy   . Child birth x 5   . Abdominal hysterectomy     fibroid tumors    Allergies  Allergies  Allergen Reactions  . Lisinopril     REACTION: cough  . Penicillins Nausea Only    HPI  61 year old female with the above problem list.  She reports that over the past year she has had episodic chest soreness associated with fatigue occurring approximately 5 or 6 times and lasting days at a time.  The last episode of chest soreness occurred last week and lasted about 3 or 4 days.  She saw her primary care provider and an ECG was performed and was normal.  She was then referred for cardiology evaluation.  She has not had any chest pain over the past 2 days.  She denies PND, dyspnea exertion, orthopnea.  The pain is not reproducible with palpation, cough, deep breathing, or upper body movements.  Home Medications  Prior to Admission medications   Medication Sig Start Date End Date Taking? Authorizing Provider  aspirin 81 MG chewable tablet Chew 81 mg by mouth daily. UAD    Yes Historical Provider, MD  Calcium Carbonate-Vitamin D (CALCIUM-VITAMIN D) 500-200 MG-UNIT per tablet Take 1 tablet by mouth 2 (two) times daily with a meal.   Yes Historical Provider, MD  eszopiclone (LUNESTA) 2 MG TABS Take 1 tablet (2 mg total) by  mouth at bedtime. Take immediately before bedtime 05/08/12  Yes Madelin Headings, MD  losartan-hydrochlorothiazide (HYZAAR) 100-25 MG per tablet Take 1 tablet by mouth daily. 07/02/11  Yes Madelin Headings, MD  Multiple Vitamin (MULITIVITAMIN WITH MINERALS) TABS Take 1 tablet by mouth daily.   Yes Historical Provider, MD  VITAMIN E PO Take by mouth. As needed    Historical Provider, MD   Review of Systems Chest soreness and fatigue as outlined above.  No sob, n, v, dizziness, syncope, edema, early satiety, dysuria, dark stools, blood in stools, diarrhea, rash/skin changes, fevers, chills, wt loss/gain.  Otherwise all systems reviewed and negative.  Physical Exam  Blood pressure 138/80, pulse 80, height 5\' 1"  (1.549 m), weight 162 lb (73.483 kg).  General: Pleasant, NAD Psych: Normal affect. Neuro: Alert and oriented X 3. Moves all extremities spontaneously. HEENT: Normal  Neck: Supple without bruits or JVD. Lungs:  Resp regular and unlabored, CTA. Heart: RRR no s3, s4.  1/6 SEM RUSB. Abdomen: Soft, non-tender, non-distended, BS + x 4.  Extremities: No clubbing, cyanosis or edema. DP/PT/Radials 2+ and equal bilaterally.  Accessory Clinical Findings  ECG - from May 16:  sinus rhythm, 69, no acute ST or T changes.  Assessment & Plan  1.  Mid sternal chest pain:  Patient has  a one-year history of episodic chest discomfort that last days at a time.  The most recent episode occurred late last week.  She is currently pain-free an ECG performed in the setting of chest pain the other day was normal.  We have arranged for an exercise Myoview to rule out ischemia.  She does have a history of nonobstructive CAD.  2.  Systolic murmur:  Not previously documented.  We'll plan for echo.  3.  Hypertension: Stable.  4.  Disposition: Followup in 2-3 weeks.  Nicolasa Ducking, NP 05/12/2012, 1:08 PM

## 2012-05-12 NOTE — Patient Instructions (Signed)
Your physician recommends that you schedule a follow-up appointment on June 3 with Ward Givens, NP (while Dr Tenny Craw is in the office) Your physician has requested that you have an echocardiogram. Echocardiography is a painless test that uses sound waves to create images of your heart. It provides your doctor with information about the size and shape of your heart and how well your heart's chambers and valves are working. This procedure takes approximately one hour. There are no restrictions for this procedure.  Your physician has requested that you have en exercise stress myoview. For further information please visit [email or web address] Please follow instruction sheet, as given.

## 2012-05-16 ENCOUNTER — Other Ambulatory Visit: Payer: Self-pay

## 2012-05-16 ENCOUNTER — Ambulatory Visit (HOSPITAL_COMMUNITY): Payer: 59 | Attending: Cardiology

## 2012-05-16 DIAGNOSIS — R011 Cardiac murmur, unspecified: Secondary | ICD-10-CM | POA: Insufficient documentation

## 2012-05-16 DIAGNOSIS — I059 Rheumatic mitral valve disease, unspecified: Secondary | ICD-10-CM | POA: Insufficient documentation

## 2012-05-16 DIAGNOSIS — R072 Precordial pain: Secondary | ICD-10-CM | POA: Insufficient documentation

## 2012-05-16 DIAGNOSIS — I10 Essential (primary) hypertension: Secondary | ICD-10-CM | POA: Insufficient documentation

## 2012-05-16 DIAGNOSIS — I379 Nonrheumatic pulmonary valve disorder, unspecified: Secondary | ICD-10-CM | POA: Insufficient documentation

## 2012-05-16 DIAGNOSIS — I079 Rheumatic tricuspid valve disease, unspecified: Secondary | ICD-10-CM | POA: Insufficient documentation

## 2012-05-22 ENCOUNTER — Ambulatory Visit (HOSPITAL_COMMUNITY): Payer: 59 | Attending: Nurse Practitioner | Admitting: Radiology

## 2012-05-22 DIAGNOSIS — R079 Chest pain, unspecified: Secondary | ICD-10-CM | POA: Insufficient documentation

## 2012-05-22 DIAGNOSIS — Z8249 Family history of ischemic heart disease and other diseases of the circulatory system: Secondary | ICD-10-CM | POA: Insufficient documentation

## 2012-05-22 DIAGNOSIS — I10 Essential (primary) hypertension: Secondary | ICD-10-CM | POA: Insufficient documentation

## 2012-05-22 DIAGNOSIS — R5383 Other fatigue: Secondary | ICD-10-CM | POA: Insufficient documentation

## 2012-05-22 DIAGNOSIS — I2510 Atherosclerotic heart disease of native coronary artery without angina pectoris: Secondary | ICD-10-CM

## 2012-05-22 DIAGNOSIS — R5381 Other malaise: Secondary | ICD-10-CM | POA: Insufficient documentation

## 2012-05-22 DIAGNOSIS — E785 Hyperlipidemia, unspecified: Secondary | ICD-10-CM | POA: Insufficient documentation

## 2012-05-22 MED ORDER — TECHNETIUM TC 99M TETROFOSMIN IV KIT
10.8000 | PACK | Freq: Once | INTRAVENOUS | Status: AC | PRN
  Administered 2012-05-22: 11 via INTRAVENOUS

## 2012-05-22 MED ORDER — TECHNETIUM TC 99M TETROFOSMIN IV KIT
32.8000 | PACK | Freq: Once | INTRAVENOUS | Status: AC | PRN
  Administered 2012-05-22: 32.8 via INTRAVENOUS

## 2012-05-22 MED ORDER — REGADENOSON 0.4 MG/5ML IV SOLN
0.4000 mg | Freq: Once | INTRAVENOUS | Status: AC
  Administered 2012-05-22: 0.4 mg via INTRAVENOUS

## 2012-05-22 NOTE — Progress Notes (Signed)
[facility] SITE 3 NUCLEAR MED [address] [place] [address] [phone]  Cardiology Nuclear Med Study  Kaylee Adams is a 61 y.o. female     MRN : [id number]     DOB: 1951/02/25  Procedure Date: 05/22/2012  Nuclear Med Background Indication for Stress Test:  Evaluation for Ischemia History:  '06 Cath:N/O CAD; '07 OZH:YQMVHQ, EF=72%; 05/16/12 Echo:EF=55-60% Cardiac Risk Factors: Family History - CAD, Hypertension and Lipids  Symptoms:  Chest Pain/"Soreness" (last episode of chest discomfort was Tuesday, 05/20/12) and Fatigue   Nuclear Pre-Procedure Caffeine/Decaff Intake:  None> 12 hrs NPO After: 6:30pm   Lungs:  Clear. IV 0.9% NS with Angio Cath:  22g  IV Site: R Hand x 1, tolerated well IV Started by:  Irean Hong, RN  Chest Size (in):  38 Cup Size: A  Height: 5\' 1"  (1.549 m)  Weight:  157 lb (71.215 kg)  BMI:  Body mass index is 29.67 kg/(m^2). Tech Comments:  am meds. Taken;Study reviewed by Dr. Tenny Craw prior to patient leaving and she felt everything appeared stable and patient was okay to leave.  W.Deal,RT-N    Nuclear Med Study 1 or 2 day study: 1 day  Stress Test Type:  Stress  Reading MD: Charlton Haws, MD  Order Authorizing Provider: Dietrich Pates, MD and Ward Givens, NP  Resting Radionuclide: Technetium 77m Tetrofosmin  Resting Radionuclide Dose: 10.8 mCi   Stress Radionuclide:  Technetium 43m Tetrofosmin  Stress Radionuclide Dose: 32.8 mCi           Stress Protocol Rest HR: 75 Stress HR: 164  Rest BP: 143/80 Stress BP: 236/80  Exercise Time (min): 6:30 METS: 7.7   Predicted Max HR: 160 bpm % Max HR: 102.5 bpm Rate Pressure Product: 46962   Dose of Adenosine (mg):  n/a Dose of Lexiscan: n/a mg  Dose of Atropine (mg): n/a Dose of Dobutamine: n/a mcg/kg/min (at max HR)  Stress Test Technologist: Smiley Houseman, CMA-N  Nuclear Technologist:  Leonia Corona, RT-N     Rest Procedure:  Myocardial perfusion imaging was performed at rest 45 minutes  following the intravenous administration of Technetium 79m Tetrofosmin.  Rest ECG: No acute changes  Stress Procedure:  The patient exercised on the treadmill utilizing the Bruce protocol for 6:30 minutes. She then stopped due to fatigue.  She c/o her chest being "tired".  There were ST-T wave changes with PVC's, ventricular couplets and one 3-beat run of v-tach.  She also had a hypertensive response to exercise, 236/80.  Technetium 61m Tetrofosmin was injected at peak exercise and myocardial perfusion imaging was performed after a brief delay.  Stress ECG: Baseline ECG with LVH  Markedly postivie ECG with 2 mm ST depression and T wave inversion in recovery  QPS Raw Data Images:  Patient motion noted. Stress Images:  Normal homogeneous uptake in all areas of the myocardium. Rest Images:  Normal homogeneous uptake in all areas of the myocardium. Subtraction (SDS):  Normal Transient Ischemic Dilatation (Normal <1.22):  1.05 Lung/Heart Ratio (Normal <0.45):  .28  Quantitative Gated Spect Images QGS EDV:  52 ml QGS ESV:  15 ml  Impression Exercise Capacity:  Fair exercise capacity. BP Response:  Hypertensive blood pressure response. Clinical Symptoms:  No significant symptoms noted. ECG Impression:  Markedly positive with 2 mm ST segment depression and T wave inversion in recovery Comparison with Prior Nuclear Study: No images to compare  Overall Impression:  Low risk stress nuclear study. Normal nuclear images.  Markedly positive ECG with signficant resting and exercise induced HTN.  Baseline ECG LVH  LV Ejection Fraction: 72%.  LV Wall Motion:  NL LV Function; NL Wall Motion  Charlton Haws

## 2012-05-26 ENCOUNTER — Ambulatory Visit (INDEPENDENT_AMBULATORY_CARE_PROVIDER_SITE_OTHER): Payer: 59 | Admitting: Nurse Practitioner

## 2012-05-26 ENCOUNTER — Encounter: Payer: Self-pay | Admitting: Nurse Practitioner

## 2012-05-26 VITALS — BP 152/80 | HR 68 | Ht 61.0 in | Wt 162.8 lb

## 2012-05-26 DIAGNOSIS — R0789 Other chest pain: Secondary | ICD-10-CM

## 2012-05-26 DIAGNOSIS — R072 Precordial pain: Secondary | ICD-10-CM

## 2012-05-26 DIAGNOSIS — I10 Essential (primary) hypertension: Secondary | ICD-10-CM

## 2012-05-26 NOTE — Progress Notes (Addendum)
Patient Name: Kaylee Adams Date of Encounter: 05/26/2012  Primary Care Provider:  Lorretta Harp, MD, MD Primary Cardiologist:  Lovina Reach, MD  Patient Profile  61 year old female seen in clinic a few weeks ago for atypical chest pain who presents for followup.  Problem List   Past Medical History  Diagnosis Date  . HLD (hyperlipidemia)   . HTN (hypertension)   . CAD (coronary artery disease)     a. 11/2005 Cath: nonobs dzs, 30% LAD lesion on cath ;  b. 11/2005 Echo: NL EF;  c. 04/2012 Echo: EF 55-60%, Gr 2 DD;  d. 04/2012 Ex MV: EF 72%, ST depression in recovery with NL perfusion imaging - HTN response to exercise.  . Hepatic cyst     on ct [place]  . Midsternal chest pain    Past Surgical History  Procedure Date  . Vesicovaginal fistula closure w/ tah     for fibroid tumors  . Breast biopsy   . Cholecystectomy   . Child birth x 5   . Abdominal hysterectomy     fibroid tumors    Allergies  Allergies  Allergen Reactions  . Lisinopril     REACTION: cough  . Penicillins Nausea Only    HPI  61 year old female with the above problem list.  I last saw her in clinic a few weeks ago at which time she reported a fatigue and tired feeling in her chest that would last days at a time.  She had no objective evidence of ischemia her ECG at that point.  I obtained an exercise Myoview which did not show evidence of ischemia or infarct.  She was noted to have ST segment depression during recovery in the setting of LVH and hypertensive response to exercise.  Patient also had a 2-D echocardiogram which showed normal LV function and grade 2 diastolic dysfunction.  Since her last visit she has had intermittent mid chest fatigue and discomfort sometimes lasting a day at a time and often associated with anxiety at work.  She denies any associated symptoms.  Of note, patient has forgotten to take her blood pressure medicine over the past 2 days.  Home Medications  Prior to Admission  medications   Medication Sig Start Date End Date Taking? Authorizing Provider  aspirin 81 MG chewable tablet Chew 81 mg by mouth daily. UAD    Yes Historical Provider, MD  Calcium Carbonate-Vitamin D (CALCIUM-VITAMIN D) 500-200 MG-UNIT per tablet Take 1 tablet by mouth 2 (two) times daily with a meal.   Yes Historical Provider, MD  eszopiclone (LUNESTA) 2 MG TABS Take 2 mg by mouth as needed. Take immediately before bedtime 05/08/12  Yes Madelin Headings, MD  ibuprofen (ADVIL,MOTRIN) 200 MG tablet Take 200 mg by mouth as needed.   Yes Historical Provider, MD  losartan-hydrochlorothiazide (HYZAAR) 100-25 MG per tablet Take 1 tablet by mouth daily. 07/02/11  Yes Madelin Headings, MD  Multiple Vitamin (MULITIVITAMIN WITH MINERALS) TABS Take 1 tablet by mouth daily.   Yes Historical Provider, MD  VITAMIN E PO Take by mouth. As needed   Yes Historical Provider, MD   Review of Systems Chest discomfort described as a fatigued sensation as outlined above. All other systems reviewed and are otherwise negative except as noted above.  Physical Exam  Blood pressure 152/80, pulse 68, height 5\' 1"  (1.549 m), weight 162 lb 12.8 oz (73.846 kg).  General: Pleasant, NAD Psych: Normal affect. Neuro: Alert and oriented X 3. Moves all extremities  spontaneously. HEENT: Normal  Neck: Supple without bruits or JVD. Lungs:  Resp regular and unlabored, CTA. Heart: RRR no s3, s4. I don't appreciate a murmur today. Abdomen: Soft, non-tender, non-distended, BS + x 4.  Extremities: No clubbing, cyanosis or edema. DP/PT/Radials 2+ and equal bilaterally.  Assessment & Plan  1.  Mid sternal chest pain/chest fatigue:  Patient continues to have intermittent atypical chest discomfort.  Symptoms generally occur at rest or during periods of stress.  She has no associated symptoms.  She recently underwent stress testing which showed no evidence of ischemia and normal LV function.  She has prior history of nonobstructive CAD by  catheterization in 2006-performed for similar chest pain.  Would not pursue further ischemic evaluation at this time.  2.  Hypertension:  Patient's blood pressure is elevated today however she notes that she forgot to take her blood pressure medicine over the past 2 days.  We had a discussion with regards to the importance of taking her blood pressure medication especially in light of diastolic dysfunction noted on 2-D echocardiogram.  She says usually her blood pressure runs in the one teens to 120s at home and advised that she continue to follow that and reports that she is trending higher as she will require additional titration of medication.  3.  Disposition:  Followup with Dr. Tenny Craw in 6 months or sooner as necessary.   Nicolasa Ducking, NP 05/26/2012, 9:27 AM

## 2012-05-26 NOTE — Patient Instructions (Signed)
Your physician wants you to follow-up in: 6 months with DR Tenny Craw.  You will receive a reminder letter in the mail two months in advance. If you don't receive a letter, please call our office to schedule the follow-up appointment.

## 2012-06-03 ENCOUNTER — Ambulatory Visit: Payer: 59 | Admitting: Internal Medicine

## 2012-06-04 ENCOUNTER — Ambulatory Visit (INDEPENDENT_AMBULATORY_CARE_PROVIDER_SITE_OTHER): Payer: 59 | Admitting: Family

## 2012-06-04 ENCOUNTER — Encounter: Payer: Self-pay | Admitting: Family

## 2012-06-04 VITALS — BP 120/80 | Temp 98.6°F | Wt 164.0 lb

## 2012-06-04 DIAGNOSIS — S80862A Insect bite (nonvenomous), left lower leg, initial encounter: Secondary | ICD-10-CM

## 2012-06-04 DIAGNOSIS — W57XXXA Bitten or stung by nonvenomous insect and other nonvenomous arthropods, initial encounter: Secondary | ICD-10-CM

## 2012-06-04 DIAGNOSIS — T148XXA Other injury of unspecified body region, initial encounter: Secondary | ICD-10-CM

## 2012-06-04 DIAGNOSIS — L299 Pruritus, unspecified: Secondary | ICD-10-CM

## 2012-06-04 DIAGNOSIS — S90569A Insect bite (nonvenomous), unspecified ankle, initial encounter: Secondary | ICD-10-CM

## 2012-06-04 NOTE — Patient Instructions (Addendum)
Hydrocortisone cream applied to the affected area twice a day.  Insect Bite Mosquitoes, flies, fleas, bedbugs, and many other insects can bite. Insect bites are different from insect stings. A sting is when venom is injected into the skin. Some insect bites can transmit infectious diseases. SYMPTOMS  Insect bites usually turn red, swell, and itch for 2 to 4 days. They often go away on their own. TREATMENT  Your caregiver may prescribe antibiotic medicines if a bacterial infection develops in the bite. HOME CARE INSTRUCTIONS  Do not scratch the bite area.   Keep the bite area clean and dry. Wash the bite area thoroughly with soap and water.   Put ice or cool compresses on the bite area.   Put ice in a plastic bag.   Place a towel between your skin and the bag.   Leave the ice on for 20 minutes, 4 times a day for the first 2 to 3 days, or as directed.   You may apply a baking soda paste, cortisone cream, or calamine lotion to the bite area as directed by your caregiver. This can help reduce itching and swelling.   Only take over-the-counter or prescription medicines as directed by your caregiver.   If you are given antibiotics, take them as directed. Finish them even if you start to feel better.  You may need a tetanus shot if:  You cannot remember when you had your last tetanus shot.   You have never had a tetanus shot.   The injury broke your skin.  If you get a tetanus shot, your arm may swell, get red, and feel warm to the touch. This is common and not a problem. If you need a tetanus shot and you choose not to have one, there is a rare chance of getting tetanus. Sickness from tetanus can be serious. SEEK IMMEDIATE MEDICAL CARE IF:   You have increased pain, redness, or swelling in the bite area.   You see a red line on the skin coming from the bite.   You have a fever.   You have joint pain.   You have a headache or neck pain.   You have unusual weakness.   You  have a rash.   You have chest pain or shortness of breath.   You have abdominal pain, nausea, or vomiting.   You feel unusually tired or sleepy.  MAKE SURE YOU:   Understand these instructions.   Will watch your condition.   Will get help right away if you are not doing well or get worse.  Document Released: 01/17/2005 Document Revised: 11/29/2011 Document Reviewed: 07/11/2011 [facility] Patient Information 2012 [place], [place].

## 2012-06-04 NOTE — Progress Notes (Signed)
Subjective:    Patient ID: Kaylee Adams, female    DOB: July 24, 1951, 61 y.o.   MRN: [id number]  HPI  61 year old [place] American female presents with a swollen, reddened area to her left calf. States she noticed it 3 days ago when it started to itch. Does not remember being bit by an insect. Denies pain to the area, fever, or myalgias. Has not tried any over the counter medications. Itching has remained the same over the past 3 days with little improvement.    Review of Systems  Constitutional: Negative for fever and chills.  Respiratory: Negative.   Cardiovascular: Negative.   Musculoskeletal: Negative for myalgias.  Skin: Positive for color change (erythematous area to left calf).  Neurological: Negative.    Past Medical History  Diagnosis Date  . HLD (hyperlipidemia)   . HTN (hypertension)   . CAD (coronary artery disease)     a. 11/2005 Cath: nonobs dzs, 30% LAD lesion on cath ;  b. 11/2005 Echo: NL EF;  c. 04/2012 Echo: EF 55-60%, Gr 2 DD;  d. 04/2012 Ex MV: EF 72%, ST depression in recovery with NL perfusion imaging - HTN response to exercise.  . Hepatic cyst     on ct [place]  . Midsternal chest pain     History   Social History  . Marital Status: Married    Spouse Name: N/A    Number of Children: N/A  . Years of Education: N/A   Occupational History  . Not on file.   Social History Main Topics  . Smoking status: Never Smoker   . Smokeless tobacco: Not on file  . Alcohol Use: No  . Drug Use: No  . Sexually Active: Not on file   Other Topics Concern  . 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 Vesicovaginal fistula closure w/ tah     for fibroid tumors  . Breast biopsy   . Cholecystectomy   . Child birth x 5   . Abdominal hysterectomy     fibroid tumors    Family History  Problem Relation Age of Onset  . Breast cancer Sister   . Hypertension Mother      Allergies  Allergen Reactions  . Lisinopril     REACTION: cough  . Penicillins Nausea Only    Current Outpatient Prescriptions on File Prior to Visit  Medication Sig Dispense Refill  . aspirin 81 MG chewable tablet Chew 81 mg by mouth daily. UAD       . Calcium Carbonate-Vitamin D (CALCIUM-VITAMIN D) 500-200 MG-UNIT per tablet Take 1 tablet by mouth 2 (two) times daily with a meal.      . losartan-hydrochlorothiazide (HYZAAR) 100-25 MG per tablet Take 1 tablet by mouth daily.  30 tablet  12  . Multiple Vitamin (MULITIVITAMIN WITH MINERALS) TABS Take 1 tablet by mouth daily.      Marland Kitchen VITAMIN E PO Take by mouth. As needed      . eszopiclone (LUNESTA) 2 MG TABS Take 2 mg by mouth as needed. Take immediately before bedtime      . ibuprofen (ADVIL,MOTRIN) 200 MG tablet Take 200 mg by mouth as needed.        BP 120/80  Temp 98.6 F (37 C) (Oral)  Wt 164 lb (74.39 kg)chart     Objective:   Physical Exam  Constitutional: She is oriented to person, place, and time. She appears well-developed and well-nourished.  Cardiovascular: Normal rate, regular rhythm and normal heart sounds.   Pulmonary/Chest: Effort normal and breath sounds normal. No respiratory distress. She has no wheezes. She has no rales.  Neurological: She is alert and oriented to person, place, and time.  Skin: Rash noted. Rash is macular. Rash is not pustular and not vesicular. There is erythema (to the left calf, minor excoriation noted, no heat or drainage noted).          Assessment & Plan:  Assessment: Insect bite, itching  Plan: Apply over the counter hydrocortisone cream to the area BID daily. Advised her to call if the area becomes hot, begins to drain, or if her symptoms worsen or persist. Follow up as needed.

## 2012-06-23 ENCOUNTER — Ambulatory Visit (INDEPENDENT_AMBULATORY_CARE_PROVIDER_SITE_OTHER): Payer: 59 | Admitting: Internal Medicine

## 2012-06-23 ENCOUNTER — Encounter: Payer: Self-pay | Admitting: Internal Medicine

## 2012-06-23 VITALS — BP 144/84 | HR 79 | Temp 98.8°F | Wt 162.0 lb

## 2012-06-23 DIAGNOSIS — E785 Hyperlipidemia, unspecified: Secondary | ICD-10-CM

## 2012-06-23 DIAGNOSIS — Z569 Unspecified problems related to employment: Secondary | ICD-10-CM

## 2012-06-23 DIAGNOSIS — R0789 Other chest pain: Secondary | ICD-10-CM

## 2012-06-23 DIAGNOSIS — Z566 Other physical and mental strain related to work: Secondary | ICD-10-CM

## 2012-06-23 DIAGNOSIS — I10 Essential (primary) hypertension: Secondary | ICD-10-CM

## 2012-06-23 LAB — LDL CHOLESTEROL, DIRECT: Direct LDL: 153.2 mg/dL

## 2012-06-23 LAB — HEPATIC FUNCTION PANEL
AST: 19 U/L (ref 0–37)
Alkaline Phosphatase: 49 U/L (ref 39–117)
Total Bilirubin: 0.5 mg/dL (ref 0.3–1.2)

## 2012-06-23 LAB — BASIC METABOLIC PANEL
Calcium: 10 mg/dL (ref 8.4–10.5)
GFR: 93.95 mL/min (ref >60.00)
Potassium: 3.8 mEq/L (ref 3.5–5.1)
Sodium: 141 mEq/L (ref 135–145)

## 2012-06-23 LAB — LIPID PANEL
HDL: 57.9 mg/dL (ref >39.00)
VLDL: 14.6 mg/dL (ref 0.0–40.0)

## 2012-06-23 NOTE — Progress Notes (Signed)
  Subjective:    Patient ID: Kaylee Adams, female    DOB: Aug 12, 1951, 61 y.o.   MRN: [id number]  HPI Patient comes in for followup after evaluation for chest pressure and weakness feeling. Since her last visit she's doing some better to have a cardiology evaluation with a stress test. This was negative or low risk for ischemia.  She has not taken the [facility] but has tried melatonin a few nights a week with some help. Trying to go to bed earlier at 9:00. Decreasing her caffeine. She continues to exercise without difficulty. She thinks her symptoms are from stress or panic. 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  She's also here for followup of her elevated lipid studies that were done on her health screen in December. Had been doing lifestyle intervention but not as much recently because of above. Reports blood pressure readings in range when checked at home occasionally Review of Systems No sob cough constitutional bleeding new gi sx no cough or wheezing.  Past history family history social history reviewed in the electronic medical record. Outpatient Encounter Prescriptions as of 06/23/2012  Medication Sig Dispense Refill  . aspirin 81 MG chewable tablet Chew 81 mg by mouth daily. UAD       . Calcium Carbonate-Vitamin D (CALCIUM-VITAMIN D) 500-200 MG-UNIT per tablet Take 1 tablet by mouth 2 (two) times daily with a meal.      . ibuprofen (ADVIL,MOTRIN) 200 MG tablet Take 200 mg by mouth as needed.      Marland Kitchen losartan-hydrochlorothiazide (HYZAAR) 100-25 MG per tablet Take 1 tablet by mouth daily.  30 tablet  12  . Multiple Vitamin (MULITIVITAMIN WITH MINERALS) TABS Take 1 tablet by mouth daily.      Marland Kitchen VITAMIN E PO Take by mouth. As needed      . DISCONTD: eszopiclone (LUNESTA) 2 MG TABS Take 2 mg by mouth as needed. Take immediately before bedtime           Objective:   Physical Exam BP 144/84  Pulse 79  Temp 98.8 F (37.1 C) (Oral)   Wt 162 lb (73.483 kg)  SpO2 95%  Repeat bp 138/80 right arm sitting  Neck: Supple without adenopathy or masses or bruits Chest:  Quiet resp  CV:  S1-S2 no gallops or murmurs peripheral perfusion is normal Abdomen:  Sof,t normal bowel sounds without hepatosplenomegaly, no guarding rebound or masses no CVA tenderness Oriented x 3. Normal cognition, attention, speech. Not anxious or depressed appearing   Good eye contact . Reviewed  Last labs one via health screen  ldl 170s and tc 260 range      Assessment & Plan:   Atypical chest pain and weakness feeling felt prob from stress at  Work. Improved. Sleep some better with lsi  No meds except ocass melatonin  bp slightly up today  Monitor  repeat is in the high normal range Elevated lipids   lifestyle intervention at this time. Check today with bmp and liver   Plan cpx in 6 months or so. Or as needed.  Discuss strategies about her symptoms which are improved with the knowledge that her heart evaluation was negative.  Total visit > 50% spent counseling and coordinating care

## 2012-06-23 NOTE — Patient Instructions (Addendum)
I agree that the sx could be stress.  Agree with more sleep.  No new medicine . Will notify you  of labs when available. Check Bp readings about 3 x per week. To make sure it is in range . Call if needed.  If ok then cpx  In 6 months

## 2012-07-04 ENCOUNTER — Other Ambulatory Visit: Payer: Self-pay | Admitting: Internal Medicine

## 2012-10-01 ENCOUNTER — Telehealth: Payer: Self-pay | Admitting: Internal Medicine

## 2012-10-01 NOTE — Telephone Encounter (Signed)
Blood type  Tests    Usually not covered by insurance  Unless getting a transfusion  .  she would have to pay for test  out of pocket.  dont know the cost .   Why does she need to get a blood type. ?

## 2012-10-01 NOTE — Telephone Encounter (Signed)
What test do I order?

## 2012-10-01 NOTE — Telephone Encounter (Signed)
Patient called stating that she would like to have an rx for lab to have her blood type tested sent to [facility] in [place] ph. [phone]. Please advise/assist and inform patient when this is done.

## 2012-10-02 NOTE — Telephone Encounter (Signed)
LMOM for the pt to call back. 

## 2012-10-02 NOTE — Telephone Encounter (Signed)
Pt returning call, advised of Dr. Rosezella [place] response.  Pt states she was reading a book on weight loss and the foods that you should eat depending on your blood type.  Patient stated she would go another route in order to find out what her blood type was.

## 2012-10-09 ENCOUNTER — Other Ambulatory Visit: Payer: Self-pay | Admitting: Internal Medicine

## 2012-11-25 ENCOUNTER — Ambulatory Visit: Payer: 59 | Admitting: Internal Medicine

## 2012-12-23 ENCOUNTER — Encounter: Payer: Self-pay | Admitting: Internal Medicine

## 2012-12-23 ENCOUNTER — Ambulatory Visit (INDEPENDENT_AMBULATORY_CARE_PROVIDER_SITE_OTHER): Payer: 59 | Admitting: Internal Medicine

## 2012-12-23 VITALS — BP 158/90 | HR 78 | Temp 97.9°F | Ht 60.0 in | Wt 169.0 lb

## 2012-12-23 DIAGNOSIS — E785 Hyperlipidemia, unspecified: Secondary | ICD-10-CM

## 2012-12-23 DIAGNOSIS — I2510 Atherosclerotic heart disease of native coronary artery without angina pectoris: Secondary | ICD-10-CM

## 2012-12-23 DIAGNOSIS — I10 Essential (primary) hypertension: Secondary | ICD-10-CM

## 2012-12-23 MED ORDER — AMLODIPINE BESYLATE 5 MG PO TABS: 5.0000 mg | ORAL_TABLET | Freq: Every day | ORAL | Status: DC

## 2012-12-23 NOTE — Patient Instructions (Addendum)
Your blood pressure is elevated today .   Continue medication and add new one  unles readings are all in range Intensify lifestyle interventions.  DIET EXERCISE. Weight loss will help your cholesterol and  BP . Recheck in  2 months and labs previsit  I will put the orders in  Contact us if needed in the meantime . We may need to add cholesterol medication if still high .    Fat and Cholesterol Control Diet Cholesterol levels in your body are determined significantly by your diet. Cholesterol levels may also be related to heart disease. The following material helps to explain this relationship and discusses what you can do to help keep your heart healthy. Not all cholesterol is bad. Low-density lipoprotein (LDL) cholesterol is the "bad" cholesterol. It may cause fatty deposits to build up inside your arteries. High-density lipoprotein (HDL) cholesterol is "good." It helps to remove the "bad" LDL cholesterol from your blood. Cholesterol is a very important risk factor for heart disease. Other risk factors are high blood pressure, smoking, stress, heredity, and weight. The heart muscle gets its supply of blood through the coronary arteries. If your LDL cholesterol is high and your HDL cholesterol is low, you are at risk for having fatty deposits build up in your coronary arteries. This leaves less room through which blood can flow. Without sufficient blood and oxygen, the heart muscle cannot function properly and you may feel chest pains (angina pectoris). When a coronary artery closes up entirely, a part of the heart muscle may die causing a heart attack (myocardial infarction). CHECKING CHOLESTEROL When your caregiver sends your blood to a lab to be examined for cholesterol, a complete lipid (fat) profile may be done. With this test, the total amount of cholesterol and levels of LDL and HDL are determined. Triglycerides are a type of fat that circulates in the blood. They can also be used to determine  heart disease risk. The list below describes what the numbers should be: Test: Total Cholesterol.  Less than 200 mg/dl. Test: LDL "bad cholesterol."  Less than 100 mg/dl.  Less than 70 mg/dl if you are at very high risk of a heart attack or sudden cardiac death. Test: HDL "good cholesterol."  Greater than 50 mg/dl for women.  Greater than 40 mg/dl for men. Test: Triglycerides.  Less than 150 mg/dl. CONTROLLING CHOLESTEROL WITH DIET Although exercise and lifestyle factors are important, your diet is key. That is because certain foods are known to raise cholesterol and others to lower it. The goal is to balance foods for their effect on cholesterol and more importantly, to replace saturated and trans fat with other types of fat, such as monounsaturated fat, polyunsaturated fat, and omega-3 fatty acids. On average, a person should consume no more than 15 to 17 g of saturated fat daily. Saturated and trans fats are considered "bad" fats, and they will raise LDL cholesterol. Saturated fats are primarily found in animal products such as meats, butter, and cream. However, that does not mean you need to give up all your favorite foods. Today, there are good tasting, low-fat, low-cholesterol substitutes for most of the things you like to eat. Choose low-fat or nonfat alternatives. Choose round or loin cuts of red meat. These types of cuts are lowest in fat and cholesterol. Chicken (without the skin), fish, veal, and ground [place] breast are great choices. Eliminate fatty meats, such as hot dogs and salami. Even shellfish have little or no saturated fat. Have a  3 oz (85 g) portion when you eat lean meat, poultry, or fish. Trans fats are also called "partially hydrogenated oils." They are oils that have been scientifically manipulated so that they are solid at room temperature resulting in a longer shelf life and improved taste and texture of foods in which they are added. Trans fats are found in stick  margarine, some tub margarines, cookies, crackers, and baked goods.  When baking and cooking, oils are a great substitute for butter. The monounsaturated oils are especially beneficial since it is believed they lower LDL and raise HDL. The oils you should avoid entirely are saturated tropical oils, such as coconut and palm.  Remember to eat a lot from food groups that are naturally free of saturated and trans fat, including fish, fruit, vegetables, beans, grains (barley, rice, couscous, bulgur wheat), and pasta (without cream sauces).  IDENTIFYING FOODS THAT LOWER CHOLESTEROL  Soluble fiber may lower your cholesterol. This type of fiber is found in fruits such as apples, vegetables such as broccoli, potatoes, and carrots, legumes such as beans, peas, and lentils, and grains such as barley. Foods fortified with plant sterols (phytosterol) may also lower cholesterol. You should eat at least 2 g per day of these foods for a cholesterol lowering effect.  Read package labels to identify low-saturated fats, trans fat free, and low-fat foods at the supermarket. Select cheeses that have only 2 to 3 g saturated fat per ounce. Use a heart-healthy tub margarine that is free of trans fats or partially hydrogenated oil. When buying baked goods (cookies, crackers), avoid partially hydrogenated oils. Breads and muffins should be made from whole grains (whole-wheat or whole oat flour, instead of "flour" or "enriched flour"). Buy non-creamy canned soups with reduced salt and no added fats.  FOOD PREPARATION TECHNIQUES  Never deep-fry. If you must fry, either stir-fry, which uses very little fat, or use non-stick cooking sprays. When possible, broil, bake, or roast meats, and steam vegetables. Instead of putting butter or margarine on vegetables, use lemon and herbs, applesauce, and cinnamon (for squash and sweet potatoes), nonfat yogurt, salsa, and low-fat dressings for salads.  LOW-SATURATED FAT / LOW-FAT FOOD  SUBSTITUTES Meats / Saturated Fat (g)  Avoid: Steak, marbled (3 oz/85 g) / 11 g  Choose: Steak, lean (3 oz/85 g) / 4 g  Avoid: Hamburger (3 oz/85 g) / 7 g  Choose: Hamburger, lean (3 oz/85 g) / 5 g  Avoid: Ham (3 oz/85 g) / 6 g  Choose: Ham, lean cut (3 oz/85 g) / 2.4 g  Avoid: Chicken, with skin, dark meat (3 oz/85 g) / 4 g  Choose: Chicken, skin removed, dark meat (3 oz/85 g) / 2 g  Avoid: Chicken, with skin, light meat (3 oz/85 g) / 2.5 g  Choose: Chicken, skin removed, light meat (3 oz/85 g) / 1 g Dairy / Saturated Fat (g)  Avoid: Whole milk (1 cup) / 5 g  Choose: Low-fat milk, 2% (1 cup) / 3 g  Choose: Low-fat milk, 1% (1 cup) / 1.5 g  Choose: Skim milk (1 cup) / 0.3 g  Avoid: Hard cheese (1 oz/28 g) / 6 g  Choose: Skim milk cheese (1 oz/28 g) / 2 to 3 g  Avoid: Cottage cheese, 4% fat (1 cup) / 6.5 g  Choose: Low-fat cottage cheese, 1% fat (1 cup) / 1.5 g  Avoid: Ice cream (1 cup) / 9 g  Choose: Sherbet (1 cup) / 2.5 g  Choose: Nonfat frozen yogurt (  1 cup) / 0.3 g  Choose: Frozen fruit bar / trace  Avoid: Whipped cream (1 tbs) / 3.5 g  Choose: Nondairy whipped topping (1 tbs) / 1 g Condiments / Saturated Fat (g)  Avoid: Mayonnaise (1 tbs) / 2 g  Choose: Low-fat mayonnaise (1 tbs) / 1 g  Avoid: Butter (1 tbs) / 7 g  Choose: Extra light margarine (1 tbs) / 1 g  Avoid: Coconut oil (1 tbs) / 11.8 g  Choose: Olive oil (1 tbs) / 1.8 g  Choose: Corn oil (1 tbs) / 1.7 g  Choose: Safflower oil (1 tbs) / 1.2 g  Choose: Sunflower oil (1 tbs) / 1.4 g  Choose: Soybean oil (1 tbs) / 2.4 g  Choose: Canola oil (1 tbs) / 1 g Document Released: 12/10/2005 Document Revised: 03/03/2012 Document Reviewed: 05/31/2011 [facility] Patient Information 2013 [place], [place]. DASH Diet The DASH diet stands for "Dietary Approaches to Stop Hypertension." It is a healthy eating plan that has been shown to reduce high blood pressure (hypertension) in as little as 14  days, while also possibly providing other significant health benefits. These other health benefits include reducing the risk of breast cancer after menopause and reducing the risk of type 2 diabetes, heart disease, colon cancer, and stroke. Health benefits also include weight loss and slowing kidney failure in patients with chronic kidney disease.  DIET GUIDELINES  Limit salt (sodium). Your diet should contain less than 1500 mg of sodium daily.  Limit refined or processed carbohydrates. Your diet should include mostly whole grains. Desserts and added sugars should be used sparingly.  Include small amounts of heart-healthy fats. These types of fats include nuts, oils, and tub margarine. Limit saturated and trans fats. These fats have been shown to be harmful in the body. CHOOSING FOODS  The following food groups are based on a 2000 calorie diet. See your Registered Dietitian for individual calorie needs. Grains and Grain Products (6 to 8 servings daily)  Eat More Often: Whole-wheat bread, brown rice, whole-grain or wheat pasta, quinoa, popcorn without added fat or salt (air popped).  Eat Less Often: White bread, white pasta, white rice, cornbread. Vegetables (4 to 5 servings daily)  Eat More Often: Fresh, frozen, and canned vegetables. Vegetables may be raw, steamed, roasted, or grilled with a minimal amount of fat.  Eat Less Often/Avoid: Creamed or fried vegetables. Vegetables in a cheese sauce. Fruit (4 to 5 servings daily)  Eat More Often: All fresh, canned (in natural juice), or frozen fruits. Dried fruits without added sugar. One hundred percent fruit juice ( cup [237 mL] daily).  Eat Less Often: Dried fruits with added sugar. Canned fruit in light or heavy syrup. [employer], Fish, and Poultry (2 servings or less daily. One serving is 3 to 4 oz [85-114 g]).  Eat More Often: Ninety percent or leaner ground beef, tenderloin, sirloin. Round cuts of beef, chicken breast, [place] breast.  All fish. Grill, bake, or broil your meat. Nothing should be fried.  Eat Less Often/Avoid: Fatty cuts of meat, [place], or chicken leg, thigh, or wing. Fried cuts of meat or fish. Dairy (2 to 3 servings)  Eat More Often: Low-fat or fat-free milk, low-fat plain or light yogurt, reduced-fat or part-skim cheese.  Eat Less Often/Avoid: Milk (whole, 2%).Whole milk yogurt. Full-fat cheeses. Nuts, Seeds, and Legumes (4 to 5 servings per week)  Eat More Often: All without added salt.  Eat Less Often/Avoid: Salted nuts and seeds, canned beans with added salt. Fats  and Sweets (limited)  Eat More Often: Vegetable oils, tub margarines without trans fats, sugar-free gelatin. Mayonnaise and salad dressings.  Eat Less Often/Avoid: Coconut oils, palm oils, butter, stick margarine, cream, half and half, cookies, candy, pie. FOR MORE INFORMATION The Dash Diet Eating Plan: www.dashdiet.org Document Released: 11/29/2011 Document Revised: 03/03/2012 Document Reviewed: 11/29/2011 [facility] Patient Information 2013 [place], [place]. How to Take Your Blood Pressure  These instructions are only for electronic home blood pressure machines. You will need:   An automatic or semi-automatic blood pressure machine.  Fresh batteries for the blood pressure machine. HOW DO I USE THESE TOOLS TO CHECK MY BLOOD PRESSURE?   There are 2 numbers that make up your blood pressure. For example: 120/80.  The first number (120 in our example) is called the "systolic pressure." It is a measure of the pressure in your blood vessels when your heart is pumping blood.  The second number (80 in our example) is called the "diastolic pressure." It is a measure of the pressure in your blood vessels when your heart is resting between beats.  Before you buy a home blood pressure machine, check the size of your arm so you can buy the right size cuff. Here is how to check the size of your arm:  Use a tape measure that shows both inches  and centimeters.  Wrap the tape measure around the middle upper part of your arm. You may need someone to help you measure right.  Write down your arm measurement in both inches and centimeters.  To measure your blood pressure right, it is important to have the right size cuff.  If your arm is up to 13 inches (37 to 34 centimeters), get an adult cuff size.  If your arm is 13 to 17 inches (35 to 44 centimeters), get a large adult cuff size.  If your arm is 17 to 20 inches (45 to 52 centimeters), get an adult thigh cuff.  Try to rest or relax for at least 30 minutes before you check your blood pressure.  Do not smoke.  Do not have any drinks with caffeine, such as:  Pop.  Coffee.  Tea.  Check your blood pressure in a quiet room.  Sit down and stretch out your arm on a table. Keep your arm at about the level of your heart. Let your arm relax. GETTING BLOOD PRESSURE READINGS  Make sure you remove any tight-fighting clothing from your arm. Wrap the cuff around your upper arm. Wrap it just above the bend, and above where you felt the pulse. You should be able to slip a finger between the cuff and your arm. If you cannot slip a finger in the cuff, it is too tight and should be removed and rewrapped.  Some units requires you to manually pump up the arm cuff.  Automatic units inflate the cuff when you press a button.  Cuff deflation is automatic in both models.  After the cuff is inflated, the unit measures your blood pressure and pulse. The readings are displayed on a monitor. Hold still and breathe normally while the cuff is inflated.  Getting a reading takes less than a minute.  Some models store readings in a memory. Some provide a printout of readings.  Get readings at different times of the day. You should wait at least 5 minutes between readings. Take readings with you to your next doctor's visit. Document Released: 11/22/2008 Document Revised: 03/03/2012 Document  Reviewed: 11/22/2008 [employer] Patient Information 2013  [employer]

## 2012-12-23 NOTE — Progress Notes (Signed)
Chief Complaint  Patient presents with  . Hypertension  . Hyperlipidemia    HPI: Patient comes in today for follow up of  multiple medical problems.  BP;  check at home was 129/80 about   Other Circumstance .taking med but not checking readings  Has gained some weight going off healthy LS regimen and plans on weight loss  No cough or se of current med.  No cp sob now   ( see ed visit for vague chest abd sx in past)  Sleep ok . No new sx.  ROS: See pertinent positives and negatives per HPI. No bleeding utis x edema change in health  Husband with gi illness today ,   Past Medical History  Diagnosis Date  . HLD (hyperlipidemia)   . HTN (hypertension)   . CAD (coronary artery disease)     a. 11/2005 Cath: nonobs dzs, 30% LAD lesion on cath ;  b. 11/2005 Echo: NL EF;  c. 04/2012 Echo: EF 55-60%, Gr 2 DD;  d. 04/2012 Ex MV: EF 72%, ST depression in recovery with NL perfusion imaging - HTN response to exercise.  . Hepatic cyst     on ct [place]  . Midsternal chest pain     Family History  Problem Relation Age of Onset  . Breast cancer Sister   . Hypertension Mother     History   Social History  . Marital Status: Married    Spouse Name: N/A    Number of Children: N/A  . Years of Education: N/A   Social History Main Topics  . Smoking status: Never Smoker   . Smokeless tobacco: None  . Alcohol Use: No  . Drug Use: No  . Sexually Active: None   Other Topics Concern  . 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 aspirin 81 MG chewable tablet Chew 81 mg by mouth daily. UAD       . Calcium Carbonate-Vitamin D (CALCIUM-VITAMIN D) 500-200 MG-UNIT per tablet Take 1 tablet by mouth 2 (two) times daily with a meal.      . ibuprofen (ADVIL,MOTRIN) 200 MG tablet Take 200 mg by mouth as needed.      Marland Kitchen losartan-hydrochlorothiazide (HYZAAR) 100-25 MG per  tablet TAKE ONE TABLET BY MOUTH EVERY DAY  90 tablet  0  . Multiple Vitamin (MULITIVITAMIN WITH MINERALS) TABS Take 1 tablet by mouth daily.      Marland Kitchen amLODipine (NORVASC) 5 MG tablet Take 1 tablet (5 mg total) by mouth daily.  30 tablet  2  . VITAMIN E PO Take by mouth. As needed        EXAM:  BP 158/90  Pulse 78  Temp 97.9 F (36.6 C)  Ht 5' (1.524 m)  Wt 169 lb (76.658 kg)  BMI 33.01 kg/m2  SpO2 98%  Body mass index is 33.01 kg/(m^2). Wt Readings from Last 3 Encounters:  12/23/12 169 lb (76.658 kg)  06/23/12 162 lb (73.483 kg)  06/04/12 164 lb (74.39 kg)    GENERAL: vitals reviewed and listed above, alert, oriented, appears well hydrated and in no acute distress  HEENT: atraumatic, conjunctiva  clear, no obvious abnormalities on inspection of external nose and ears OP : no lesion edema or exudate   NECK: no obvious masses on inspection palpation  No bruits heard   LUNGS: clear to auscultation  bilaterally, no wheezes, rales or rhonchi, good air movement  CV: HRRR, no clubbing cyanosis or  peripheral edema nl cap refill  Abdomen:  Sof,t normal bowel sounds without hepatosplenomegaly, no guarding rebound or masses no CVA tenderness  Mild ruq discomfort  MS: moves all extremities without noticeable focal  abnormality  PSYCH: pleasant and cooperative, no obvious depression or anxiety Lab Results  Component Value Date   WBC 5.5 03/19/2009   HGB 13.0 10/13/2010   HCT 41.2 03/19/2009   PLT 306 03/19/2009   GLUCOSE 94 06/23/2012   CHOL 240* 06/23/2012   TRIG 73.0 06/23/2012   HDL 57.90 06/23/2012   LDLDIRECT 153.2 06/23/2012   LDLCALC 163* 06/26/2011   ALT 25 06/23/2012   AST 19 06/23/2012   NA 141 06/23/2012   K 3.8 06/23/2012   CL 105 06/23/2012   CREATININE 0.8 06/23/2012   BUN 17 06/23/2012   CO2 28 06/23/2012   TSH 0.574 02/12/2008   INR 0.9 11/29/2008   HGBA1C 5.7 02/12/2008    ASSESSMENT AND PLAN:  Discussed the following assessment and plan:  1. HYPERTENSION  amLODipine (NORVASC) 5 MG  tablet, CBC with Differential, Lipid panel, TSH, Basic metabolic panel   up today compliant needs better control if elevated add medication and intensify lsi   2. CAD, NATIVE VESSEL  amLODipine (NORVASC) 5 MG tablet, CBC with Differential, Lipid panel, TSH, Basic metabolic panel   non critical disease but need risk reduction  3. HYPERLIPIDEMIA  amLODipine (NORVASC) 5 MG tablet, CBC with Differential, Lipid panel, TSH, Basic metabolic panel   needs improvement resistant to taking statin in past. will readress     -Patient advised to return or notify health care team  immediately if symptoms worsen or persist or new concerns arise.  Patient Instructions  Your blood pressure is elevated today .   Continue medication and add new one  unles readings are all in range Intensify lifestyle interventions.  DIET EXERCISE. Weight loss will help your cholesterol and  BP . Recheck in  2 months and labs previsit  I will put the orders in  Contact us if needed in the meantime . We may need to add cholesterol medication if still high .    Fat and Cholesterol Control Diet Cholesterol levels in your body are determined significantly by your diet. Cholesterol levels may also be related to heart disease. The following material helps to explain this relationship and discusses what you can do to help keep your heart healthy. Not all cholesterol is bad. Low-density lipoprotein (LDL) cholesterol is the "bad" cholesterol. It may cause fatty deposits to build up inside your arteries. High-density lipoprotein (HDL) cholesterol is "good." It helps to remove the "bad" LDL cholesterol from your blood. Cholesterol is a very important risk factor for heart disease. Other risk factors are high blood pressure, smoking, stress, heredity, and weight. The heart muscle gets its supply of blood through the coronary arteries. If your LDL cholesterol is high and your HDL cholesterol is low, you are at risk for having fatty deposits  build up in your coronary arteries. This leaves less room through which blood can flow. Without sufficient blood and oxygen, the heart muscle cannot function properly and you may feel chest pains (angina pectoris). When a coronary artery closes up entirely, a part of the heart muscle may die causing a heart attack (myocardial infarction). CHECKING CHOLESTEROL When your caregiver sends your blood to a lab to be examined for cholesterol, a complete lipid (  fat) profile may be done. With this test, the total amount of cholesterol and levels of LDL and HDL are determined. Triglycerides are a type of fat that circulates in the blood. They can also be used to determine heart disease risk. The list below describes what the numbers should be: Test: Total Cholesterol.  Less than 200 mg/dl. Test: LDL "bad cholesterol."  Less than 100 mg/dl.  Less than 70 mg/dl if you are at very high risk of a heart attack or sudden cardiac death. Test: HDL "good cholesterol."  Greater than 50 mg/dl for women.  Greater than 40 mg/dl for men. Test: Triglycerides.  Less than 150 mg/dl. CONTROLLING CHOLESTEROL WITH DIET Although exercise and lifestyle factors are important, your diet is key. That is because certain foods are known to raise cholesterol and others to lower it. The goal is to balance foods for their effect on cholesterol and more importantly, to replace saturated and trans fat with other types of fat, such as monounsaturated fat, polyunsaturated fat, and omega-3 fatty acids. On average, a person should consume no more than 15 to 17 g of saturated fat daily. Saturated and trans fats are considered "bad" fats, and they will raise LDL cholesterol. Saturated fats are primarily found in animal products such as meats, butter, and cream. However, that does not mean you need to give up all your favorite foods. Today, there are good tasting, low-fat, low-cholesterol substitutes for most of the things you like to eat.  Choose low-fat or nonfat alternatives. Choose round or loin cuts of red meat. These types of cuts are lowest in fat and cholesterol. Chicken (without the skin), fish, veal, and ground [place] breast are great choices. Eliminate fatty meats, such as hot dogs and salami. Even shellfish have little or no saturated fat. Have a 3 oz (85 g) portion when you eat lean meat, poultry, or fish. Trans fats are also called "partially hydrogenated oils." They are oils that have been scientifically manipulated so that they are solid at room temperature resulting in a longer shelf life and improved taste and texture of foods in which they are added. Trans fats are found in stick margarine, some tub margarines, cookies, crackers, and baked goods.  When baking and cooking, oils are a great substitute for butter. The monounsaturated oils are especially beneficial since it is believed they lower LDL and raise HDL. The oils you should avoid entirely are saturated tropical oils, such as coconut and palm.  Remember to eat a lot from food groups that are naturally free of saturated and trans fat, including fish, fruit, vegetables, beans, grains (barley, rice, couscous, bulgur wheat), and pasta (without cream sauces).  IDENTIFYING FOODS THAT LOWER CHOLESTEROL  Soluble fiber may lower your cholesterol. This type of fiber is found in fruits such as apples, vegetables such as broccoli, potatoes, and carrots, legumes such as beans, peas, and lentils, and grains such as barley. Foods fortified with plant sterols (phytosterol) may also lower cholesterol. You should eat at least 2 g per day of these foods for a cholesterol lowering effect.  Read package labels to identify low-saturated fats, trans fat free, and low-fat foods at the supermarket. Select cheeses that have only 2 to 3 g saturated fat per ounce. Use a heart-healthy tub margarine that is free of trans fats or partially hydrogenated oil. When buying baked goods (cookies, crackers),  avoid partially hydrogenated oils. Breads and muffins should be made from whole grains (whole-wheat or whole oat flour, instead of "flour"  or "enriched flour"). Buy non-creamy canned soups with reduced salt and no added fats.  FOOD PREPARATION TECHNIQUES  Never deep-fry. If you must fry, either stir-fry, which uses very little fat, or use non-stick cooking sprays. When possible, broil, bake, or roast meats, and steam vegetables. Instead of putting butter or margarine on vegetables, use lemon and herbs, applesauce, and cinnamon (for squash and sweet potatoes), nonfat yogurt, salsa, and low-fat dressings for salads.  LOW-SATURATED FAT / LOW-FAT FOOD SUBSTITUTES Meats / Saturated Fat (g)  Avoid: Steak, marbled (3 oz/85 g) / 11 g  Choose: Steak, lean (3 oz/85 g) / 4 g  Avoid: Hamburger (3 oz/85 g) / 7 g  Choose: Hamburger, lean (3 oz/85 g) / 5 g  Avoid: Ham (3 oz/85 g) / 6 g  Choose: Ham, lean cut (3 oz/85 g) / 2.4 g  Avoid: Chicken, with skin, dark meat (3 oz/85 g) / 4 g  Choose: Chicken, skin removed, dark meat (3 oz/85 g) / 2 g  Avoid: Chicken, with skin, light meat (3 oz/85 g) / 2.5 g  Choose: Chicken, skin removed, light meat (3 oz/85 g) / 1 g Dairy / Saturated Fat (g)  Avoid: Whole milk (1 cup) / 5 g  Choose: Low-fat milk, 2% (1 cup) / 3 g  Choose: Low-fat milk, 1% (1 cup) / 1.5 g  Choose: Skim milk (1 cup) / 0.3 g  Avoid: Hard cheese (1 oz/28 g) / 6 g  Choose: Skim milk cheese (1 oz/28 g) / 2 to 3 g  Avoid: Cottage cheese, 4% fat (1 cup) / 6.5 g  Choose: Low-fat cottage cheese, 1% fat (1 cup) / 1.5 g  Avoid: Ice cream (1 cup) / 9 g  Choose: Sherbet (1 cup) / 2.5 g  Choose: Nonfat frozen yogurt (1 cup) / 0.3 g  Choose: Frozen fruit bar / trace  Avoid: Whipped cream (1 tbs) / 3.5 g  Choose: Nondairy whipped topping (1 tbs) / 1 g Condiments / Saturated Fat (g)  Avoid: Mayonnaise (1 tbs) / 2 g  Choose: Low-fat mayonnaise (1 tbs) / 1 g  Avoid: Butter (1 tbs)  / 7 g  Choose: Extra light margarine (1 tbs) / 1 g  Avoid: Coconut oil (1 tbs) / 11.8 g  Choose: Olive oil (1 tbs) / 1.8 g  Choose: Corn oil (1 tbs) / 1.7 g  Choose: Safflower oil (1 tbs) / 1.2 g  Choose: Sunflower oil (1 tbs) / 1.4 g  Choose: Soybean oil (1 tbs) / 2.4 g  Choose: Canola oil (1 tbs) / 1 g Document Released: 12/10/2005 Document Revised: 03/03/2012 Document Reviewed: 05/31/2011 [facility] Patient Information 2013 [place], [place]. DASH Diet The DASH diet stands for "Dietary Approaches to Stop Hypertension." It is a healthy eating plan that has been shown to reduce high blood pressure (hypertension) in as little as 14 days, while also possibly providing other significant health benefits. These other health benefits include reducing the risk of breast cancer after menopause and reducing the risk of type 2 diabetes, heart disease, colon cancer, and stroke. Health benefits also include weight loss and slowing kidney failure in patients with chronic kidney disease.  DIET GUIDELINES  Limit salt (sodium). Your diet should contain less than 1500 mg of sodium daily.  Limit refined or processed carbohydrates. Your diet should include mostly whole grains. Desserts and added sugars should be used sparingly.  Include small amounts of heart-healthy fats. These types of fats include nuts, oils, and tub margarine.  Limit saturated and trans fats. These fats have been shown to be harmful in the body. CHOOSING FOODS  The following food groups are based on a 2000 calorie diet. See your Registered Dietitian for individual calorie needs. Grains and Grain Products (6 to 8 servings daily)  Eat More Often: Whole-wheat bread, brown rice, whole-grain or wheat pasta, quinoa, popcorn without added fat or salt (air popped).  Eat Less Often: White bread, white pasta, white rice, cornbread. Vegetables (4 to 5 servings daily)  Eat More Often: Fresh, frozen, and canned vegetables. Vegetables may be  raw, steamed, roasted, or grilled with a minimal amount of fat.  Eat Less Often/Avoid: Creamed or fried vegetables. Vegetables in a cheese sauce. Fruit (4 to 5 servings daily)  Eat More Often: All fresh, canned (in natural juice), or frozen fruits. Dried fruits without added sugar. One hundred percent fruit juice ( cup [237 mL] daily).  Eat Less Often: Dried fruits with added sugar. Canned fruit in light or heavy syrup. [employer], Fish, and Poultry (2 servings or less daily. One serving is 3 to 4 oz [85-114 g]).  Eat More Often: Ninety percent or leaner ground beef, tenderloin, sirloin. Round cuts of beef, chicken breast, [place] breast. All fish. Grill, bake, or broil your meat. Nothing should be fried.  Eat Less Often/Avoid: Fatty cuts of meat, [place], or chicken leg, thigh, or wing. Fried cuts of meat or fish. Dairy (2 to 3 servings)  Eat More Often: Low-fat or fat-free milk, low-fat plain or light yogurt, reduced-fat or part-skim cheese.  Eat Less Often/Avoid: Milk (whole, 2%).Whole milk yogurt. Full-fat cheeses. Nuts, Seeds, and Legumes (4 to 5 servings per week)  Eat More Often: All without added salt.  Eat Less Often/Avoid: Salted nuts and seeds, canned beans with added salt. Fats and Sweets (limited)  Eat More Often: Vegetable oils, tub margarines without trans fats, sugar-free gelatin. Mayonnaise and salad dressings.  Eat Less Often/Avoid: Coconut oils, palm oils, butter, stick margarine, cream, half and half, cookies, candy, pie. FOR MORE INFORMATION The Dash Diet Eating Plan: www.dashdiet.org Document Released: 11/29/2011 Document Revised: 03/03/2012 Document Reviewed: 11/29/2011 [facility] Patient Information 2013 [place], [place]. How to Take Your Blood Pressure  These instructions are only for electronic home blood pressure machines. You will need:   An automatic or semi-automatic blood pressure machine.  Fresh batteries for the blood pressure machine. HOW DO I USE  THESE TOOLS TO CHECK MY BLOOD PRESSURE?   There are 2 numbers that make up your blood pressure. For example: 120/80.  The first number (120 in our example) is called the "systolic pressure." It is a measure of the pressure in your blood vessels when your heart is pumping blood.  The second number (80 in our example) is called the "diastolic pressure." It is a measure of the pressure in your blood vessels when your heart is resting between beats.  Before you buy a home blood pressure machine, check the size of your arm so you can buy the right size cuff. Here is how to check the size of your arm:  Use a tape measure that shows both inches and centimeters.  Wrap the tape measure around the middle upper part of your arm. You may need someone to help you measure right.  Write down your arm measurement in both inches and centimeters.  To measure your blood pressure right, it is important to have the right size cuff.  If your arm is up to 13 inches (37 to  34 centimeters), get an adult cuff size.  If your arm is 13 to 17 inches (35 to 44 centimeters), get a large adult cuff size.  If your arm is 17 to 20 inches (45 to 52 centimeters), get an adult thigh cuff.  Try to rest or relax for at least 30 minutes before you check your blood pressure.  Do not smoke.  Do not have any drinks with caffeine, such as:  Pop.  Coffee.  Tea.  Check your blood pressure in a quiet room.  Sit down and stretch out your arm on a table. Keep your arm at about the level of your heart. Let your arm relax. GETTING BLOOD PRESSURE READINGS  Make sure you remove any tight-fighting clothing from your arm. Wrap the cuff around your upper arm. Wrap it just above the bend, and above where you felt the pulse. You should be able to slip a finger between the cuff and your arm. If you cannot slip a finger in the cuff, it is too tight and should be removed and rewrapped.  Some units requires you to manually pump up the  arm cuff.  Automatic units inflate the cuff when you press a button.  Cuff deflation is automatic in both models.  After the cuff is inflated, the unit measures your blood pressure and pulse. The readings are displayed on a monitor. Hold still and breathe normally while the cuff is inflated.  Getting a reading takes less than a minute.  Some models store readings in a memory. Some provide a printout of readings.  Get readings at different times of the day. You should wait at least 5 minutes between readings. Take readings with you to your next doctor's visit. Document Released: 11/22/2008 Document Revised: 03/03/2012 Document Reviewed: 11/22/2008 [facility] Patient Information 2013 [place], [place].      Neta Mends. Reuven Braver M.D.

## 2013-02-02 ENCOUNTER — Other Ambulatory Visit: Payer: Self-pay | Admitting: Internal Medicine

## 2013-02-03 NOTE — Telephone Encounter (Signed)
Pt is down to one pill and possible bad weather tomorrow. Pls fill if possible!  Thanks!

## 2013-02-09 ENCOUNTER — Other Ambulatory Visit: Payer: Self-pay | Admitting: Internal Medicine

## 2013-02-09 LAB — CBC WITH DIFFERENTIAL/PLATELET
Eosinophils Absolute: 0 10*3/uL (ref 0.0–0.7)
Eosinophils Relative: 0 % (ref 0–5)
HCT: 39.4 % (ref 36.0–46.0)
Hemoglobin: 13 g/dL (ref 12.0–15.0)
Lymphs Abs: 2 10*3/uL (ref 0.7–4.0)
MCH: 25.5 pg — ABNORMAL LOW (ref 26.0–34.0)
MCV: 77.3 fL — ABNORMAL LOW (ref 78.0–100.0)
Monocytes Absolute: 0.4 10*3/uL (ref 0.1–1.0)
Monocytes Relative: 9 % (ref 3–12)
RBC: 5.1 MIL/uL (ref 3.87–5.11)

## 2013-02-10 LAB — HEPATIC FUNCTION PANEL
AST: 19 U/L (ref 0–37)
Alkaline Phosphatase: 50 U/L (ref 39–117)
Bilirubin, Direct: 0.1 mg/dL (ref 0.0–0.3)
Total Bilirubin: 0.4 mg/dL (ref 0.3–1.2)

## 2013-02-10 LAB — BASIC METABOLIC PANEL
CO2: 30 mEq/L (ref 19–32)
Calcium: 9.9 mg/dL (ref 8.4–10.5)
Creat: 0.7 mg/dL (ref 0.50–1.10)
Glucose, Bld: 94 mg/dL (ref 70–99)

## 2013-02-10 LAB — TSH: TSH: 0.946 u[IU]/mL (ref 0.350–4.500)

## 2013-02-10 LAB — LIPID PANEL: LDL Cholesterol: 177 mg/dL — ABNORMAL HIGH (ref 0–99)

## 2013-02-13 ENCOUNTER — Telehealth: Payer: Self-pay | Admitting: Internal Medicine

## 2013-02-13 ENCOUNTER — Ambulatory Visit (INDEPENDENT_AMBULATORY_CARE_PROVIDER_SITE_OTHER): Payer: 59 | Admitting: Internal Medicine

## 2013-02-13 ENCOUNTER — Ambulatory Visit (INDEPENDENT_AMBULATORY_CARE_PROVIDER_SITE_OTHER)
Admission: RE | Admit: 2013-02-13 | Discharge: 2013-02-13 | Disposition: A | Discharge status: Home / Self Care | Payer: 59 | Source: Ambulatory Visit | Attending: Internal Medicine | Admitting: Internal Medicine

## 2013-02-13 ENCOUNTER — Encounter: Payer: Self-pay | Admitting: Internal Medicine

## 2013-02-13 VITALS — BP 134/86 | HR 71 | Temp 97.9°F | Wt 169.0 lb

## 2013-02-13 DIAGNOSIS — R109 Unspecified abdominal pain: Secondary | ICD-10-CM

## 2013-02-13 DIAGNOSIS — E785 Hyperlipidemia, unspecified: Secondary | ICD-10-CM

## 2013-02-13 DIAGNOSIS — R52 Pain, unspecified: Secondary | ICD-10-CM

## 2013-02-13 DIAGNOSIS — I10 Essential (primary) hypertension: Secondary | ICD-10-CM

## 2013-02-13 LAB — POCT URINALYSIS DIP (MANUAL ENTRY)
Bilirubin, UA: NEGATIVE
Leukocytes, UA: NEGATIVE
Nitrite, UA: NEGATIVE
Protein Ur, POC: NEGATIVE
pH, UA: 7.5

## 2013-02-13 NOTE — Progress Notes (Signed)
Chief Complaint  Patient presents with  . Rt side back pain    Started as a child.  The pain comes and goes and usually spans several years between pain.    HPI: Patient comes in today for SDA for  new problem evaluation. She's had this problem off and on since a young age but not very frequently. Never delineated cause. Described it as more frequent recently and a neuropathy and very severe where she almost had to pull off the road driving.  She describes them as episodes that don't last long like minutes but very sharp stabbing somewhat burning pain in the right flank area it does not radiate to the abdomen mid back leg shoulder.  First time age 62= 62 years of age  ; then in 66s  Then  A few years ago. And then rarely.   This week  Has had more often about every day  . Not nocturnal .  Lasts 1-2 minutes.    Husband saw her have an episode and then she laid down and he suggested she get checked out. There is no associated chest pain shortness of breath pleurisy vomiting hematuria sweating..    Since her last visit she has begun amlodipine for her blood pressure but hasn't taken any cholesterol medicine has bought an over-the-counter 1 because Dr. Tenny Craw and also recommended she decrease her cholesterol level. She hasn't taken it yet. ROS: See pertinent positives and negatives per HPI. She has a history of a cholecystectomy and has had some mild right upper quadrant discomfort off and on and has been evaluated before for this  No hx of hematuria or renal stones  Past Medical History  Diagnosis Date  . HLD (hyperlipidemia)   . HTN (hypertension)   . CAD (coronary artery disease)     a. 11/2005 Cath: nonobs dzs, 30% LAD lesion on cath ;  b. 11/2005 Echo: NL EF;  c. 04/2012 Echo: EF 55-60%, Gr 2 DD;  d. 04/2012 Ex MV: EF 72%, ST depression in recovery with NL perfusion imaging - HTN response to exercise.  . Hepatic cyst     on ct [place]  . Midsternal chest pain     Family History  Problem  Relation Age of Onset  . Breast cancer Sister   . Hypertension Mother     History   Social History  . Marital Status: Married    Spouse Name: N/A    Number of Children: N/A  . Years of Education: N/A   Social History Main Topics  . Smoking status: Never Smoker   . Smokeless tobacco: None  . Alcohol Use: No  . Drug Use: No  . Sexually Active: None   Other Topics Concern  . None   Social History Narrative   Married, full time Diplomatic Services operational officer. 3 pets       6-8 hours sleep     Outpatient Encounter Prescriptions as of 02/13/2013  Medication Sig Dispense Refill  . amLODipine (NORVASC) 5 MG tablet Take 1 tablet (5 mg total) by mouth daily.  30 tablet  2  . aspirin 81 MG chewable tablet Chew 81 mg by mouth daily. UAD       . Calcium Carbonate-Vitamin D (CALCIUM-VITAMIN D) 500-200 MG-UNIT per tablet Take 1 tablet by mouth 2 (two) times daily with a meal.      . ibuprofen (ADVIL,MOTRIN) 200 MG tablet Take 200 mg by mouth as needed.      Marland Kitchen losartan-hydrochlorothiazide (HYZAAR) 100-25 MG per  tablet TAKE ONE TABLET BY MOUTH EVERY DAY  90 tablet  0  . Multiple Vitamin (MULITIVITAMIN WITH MINERALS) TABS Take 1 tablet by mouth daily.      . [DISCONTINUED] VITAMIN E PO Take by mouth. As needed       No facility-administered encounter medications on file as of 02/13/2013.    EXAM:  BP 134/86  Pulse 71  Temp(Src) 97.9 F (36.6 C) (Oral)  Wt 169 lb (76.658 kg)  BMI 33.01 kg/m2  SpO2 98%  Body mass index is 33.01 kg/(m^2).  GENERAL: vitals reviewed and listed above, alert, oriented, appears well hydrated and in no acute distress  HEENT: atraumatic, conjunctiva  clear, no obvious abnormalities on inspection of external nose and ears OP : no lesion edema or exudate   NECK: no obvious masses on inspection palpation   LUNGS: clear to auscultation bilaterally, no wheezes, rales or rhonchi, good air movement  CV: HRRR, no clubbing cyanosis or  peripheral edema nl cap refill  ABD soft  prominent right upper abdomen but no ob hernia   Right flank rib care area about 8 cm are non tendern and no point tenderness  No midline tenderness gait anl neg psoas sign MS: moves all extremities without noticeable focal  abnormality  PSYCH: pleasant and cooperative, no obvious depression or anxiety Lab Results  Component Value Date   WBC 4.4 02/09/2013   HGB 13.0 02/09/2013   HCT 39.4 02/09/2013   PLT 288 02/09/2013   GLUCOSE 94 02/09/2013   CHOL 245* 02/09/2013   TRIG 79 02/09/2013   HDL 52 02/09/2013   LDLDIRECT 153.2 06/23/2012   LDLCALC 177* 02/09/2013   ALT 22 02/09/2013   AST 19 02/09/2013   NA 143 02/09/2013   K 4.2 02/09/2013   CL 106 02/09/2013   CREATININE 0.70 02/09/2013   BUN 18 02/09/2013   CO2 30 02/09/2013   TSH 0.946 02/09/2013   INR 0.9 11/29/2008   HGBA1C 5.7 02/12/2008    ASSESSMENT AND PLAN:  Discussed the following assessment and plan:  Acute right flank pain - episodic. Uncertain cause severe no associated symptoms but concerning is interfering. - Plan: POCT urinalysis dipstick, DG Chest 2 View, US Abdomen Complete  HYPERTENSION  HYPERLIPIDEMIA There appears to be no rhyme or reason to this but is becoming more frequent reassuring is that she has had it occasionally since young person otherwise. Uncertain if this could be chest wall pain very atypical neuritis versus other mechanical problem rule out internal organ cause.   Urinalysis is clear today  We'll get chest x-ray to make sure there right diaphragmatic area is normal and abdominal renal ultrasound. Keep her followup appointment in a week or so have her calendar her symptoms to see if her triggers but she is unaware.   Continue her blood pressure medications.  Caution with her the counter supplements discussed as we cannot guarantee safety and or efficacy. She is probably a candidate for a statin medication which we can discuss later. -Patient advised to return or notify health care team  if symptoms worsen or  persist or new concerns arise.  Patient Instructions  Uncertain cause of your symptoms  .  We will arrange an abdominal kidney ultrasound and get a plain chest x ray to make sure the base of lungs are normal.   Keep appt  In follow up . Call on  Call  service if getting worse  Or concern in meantime.   Consider statin medication for  lipids    Uncertain sometimes what is in a supplement  . Safety concerns.    Neta Mends. Gay Moncivais M.D.

## 2013-02-13 NOTE — Telephone Encounter (Signed)
Patient Information:  Caller Name: [place]  Phone: [phone]  Patient: Kaylee Adams, Kaylee Adams  Gender: Female  DOB: 07-08-1951  Age: 62 Years  PCP: Berniece Andreas (Family Practice)  Office Follow Up:  Does the office need to follow up with this patient?: No  Instructions For The Office: N/A  RN Note:  afebrile; Pain is random but more frequent, but ranges from 8-10/ pain which is sharp and lasts for a few minutes. Has unpredictable times that times that it occurs.  May be driving or reaching for something. Denies urinary symptoms at this time.  Happens more in the mornings than evenings.  Intake ok; but not sleeping as well but not related to the pain.  It high on side like under bra line towards back.  Symptoms  Reason For Call & Symptoms: Pain that has progressed all week.  Has had it before but keeps coming backs  Reviewed Health History In EMR: Yes  Reviewed Medications In EMR: Yes  Reviewed Allergies In EMR: Yes  Reviewed Surgeries / Procedures: Yes  Date of Onset of Symptoms: 02/09/2013  Guideline(s) Used:  Back Pain  Disposition Per Guideline:   See Today or Tomorrow in Office  Reason For Disposition Reached:   Patient wants to be seen  Advice Given:  Call Back If:  You become worse.  Appointment Scheduled:  02/13/2013 11:00:00 Appointment Scheduled Provider:  Berniece Andreas [facility])

## 2013-02-13 NOTE — Patient Instructions (Signed)
Uncertain cause of your symptoms  .  We will arrange an abdominal kidney ultrasound and get a plain chest x ray to make sure the base of lungs are normal.   Keep appt  In follow up . Call on  Call  service if getting worse  Or concern in meantime.   Consider statin medication for lipids    Uncertain sometimes what is in a supplement  . Safety concerns.

## 2013-02-13 NOTE — Progress Notes (Signed)
Quick Note:  Tell patient that x ray shows no acute abnormality. ______ 

## 2013-02-20 ENCOUNTER — Ambulatory Visit
Admission: RE | Admit: 2013-02-20 | Discharge: 2013-02-20 | Disposition: A | Discharge status: Home / Self Care | Payer: 59 | Source: Ambulatory Visit | Attending: Internal Medicine | Admitting: Internal Medicine

## 2013-02-20 DIAGNOSIS — R109 Unspecified abdominal pain: Secondary | ICD-10-CM

## 2013-02-23 ENCOUNTER — Ambulatory Visit (INDEPENDENT_AMBULATORY_CARE_PROVIDER_SITE_OTHER): Payer: 59 | Admitting: Internal Medicine

## 2013-02-23 ENCOUNTER — Encounter: Payer: Self-pay | Admitting: Internal Medicine

## 2013-02-23 VITALS — BP 138/70 | HR 112 | Temp 97.9°F | Wt 169.0 lb

## 2013-02-23 DIAGNOSIS — I10 Essential (primary) hypertension: Secondary | ICD-10-CM

## 2013-02-23 DIAGNOSIS — E785 Hyperlipidemia, unspecified: Secondary | ICD-10-CM

## 2013-02-23 DIAGNOSIS — M546 Pain in thoracic spine: Secondary | ICD-10-CM

## 2013-02-23 DIAGNOSIS — K7689 Other specified diseases of liver: Secondary | ICD-10-CM

## 2013-02-23 NOTE — Patient Instructions (Addendum)
You'll be contacted about a referral appointment in regard to your liver cyst and right side pain. Contact us in the meantime if needed.  Continue healthy lifestyle intervention to control blood pressure and cholesterol  We can revisit the possibility of taking a statin medicine after  discomfort and liver cyst evaluation is complete.  5-10 pound weight loss in a healthy manner would be helpful.  check lipids LFTs in 4 months and followup visit at that time to decide on possible medication intervention.

## 2013-02-23 NOTE — Progress Notes (Signed)
Chief Complaint  Patient presents with  . Follow-up    HPI: Fu of abd pain see last visit  Since last visit she is much better but still has nagging knowledge of a discomfort on the right side. No new symptoms.  Had [place] and chest x ray   Has started the amlodipine trying to eat better but hasn't been able to exercise yet. No new symptoms otherwise. ROS: See pertinent positives and negatives per HPI.  Past Medical History  Diagnosis Date  . HLD (hyperlipidemia)   . HTN (hypertension)   . CAD (coronary artery disease)     a. 11/2005 Cath: nonobs dzs, 30% LAD lesion on cath ;  b. 11/2005 Echo: NL EF;  c. 04/2012 Echo: EF 55-60%, Gr 2 DD;  d. 04/2012 Ex MV: EF 72%, ST depression in recovery with NL perfusion imaging - HTN response to exercise.  . Hepatic cyst     on ct [place]  . Midsternal chest pain     Family History  Problem Relation Age of Onset  . Breast cancer Sister   . Hypertension Mother     History   Social History  . Marital Status: Married    Spouse Name: N/A    Number of Children: N/A  . Years of Education: N/A   Social History Main Topics  . Smoking status: Never Smoker   . Smokeless tobacco: None  . Alcohol Use: No  . Drug Use: No  . Sexually Active: None   Other Topics Concern  . 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 amLODipine (NORVASC) 5 MG tablet Take 1 tablet (5 mg total) by mouth daily.  30 tablet  2  . aspirin 81 MG chewable tablet Chew 81 mg by mouth daily. UAD       . Calcium Carbonate-Vitamin D (CALCIUM-VITAMIN D) 500-200 MG-UNIT per tablet Take 1 tablet by mouth 2 (two) times daily with a meal.      . ibuprofen (ADVIL,MOTRIN) 200 MG tablet Take 200 mg by mouth as needed.      Marland Kitchen losartan-hydrochlorothiazide (HYZAAR) 100-25 MG per tablet TAKE ONE TABLET BY MOUTH EVERY DAY  90 tablet  0  . Multiple  Vitamin (MULITIVITAMIN WITH MINERALS) TABS Take 1 tablet by mouth daily.       No facility-administered encounter medications on file as of 02/23/2013.    EXAM:  BP 138/70  Pulse 112  Temp(Src) 97.9 F (36.6 C)  Wt 169 lb (76.658 kg)  BMI 33.01 kg/m2  SpO2 97%  Body mass index is 33.01 kg/(m^2).  GENERAL: vitals reviewed and listed above, alert, oriented, appears well hydrated and in no acute distress Repeat blood pressure 138/70. Reviewed old labs and ultrasound shows 2 small cysts in the  Liver  one is 2.7 cm on the right and may be septated. No other alarming findings kidneys are normal   Lab Results  Component Value Date   WBC 4.4 02/09/2013   HGB 13.0 02/09/2013   HCT 39.4 02/09/2013   PLT 288 02/09/2013   GLUCOSE 94 02/09/2013   CHOL 245* 02/09/2013   TRIG 79 02/09/2013   HDL 52 02/09/2013   LDLDIRECT 153.2 06/23/2012   LDLCALC 177* 02/09/2013   ALT 22 02/09/2013   AST 19 02/09/2013   NA 143 02/09/2013  K 4.2 02/09/2013   CL 106 02/09/2013   CREATININE 0.70 02/09/2013   BUN 18 02/09/2013   CO2 30 02/09/2013   TSH 0.946 02/09/2013   INR 0.9 11/29/2008   HGBA1C 5.7 02/12/2008    ASSESSMENT AND PLAN:  Discussed the following assessment and plan:  BACK PAIN, THORACIC REGION, LEFT - improved ? if could be from  cyst although small   I suppose could have bled  into cyst and cause septation - Plan: Ambulatory referral to Gastroenterology  HEPATIC CYST - X2 one is septated consider hemorrhage causing pain versus other complex diseaseGi opinion to assess - Plan: Ambulatory referral to Gastroenterology  HYPERTENSION - improving  amlodipine added   HYPERLIPIDEMIA - pt prefers to wait on  beginning statin med will reassess in 4 months. lsi in the interim  -Patient advised to return or notify health care team  if symptoms worsen or persist or new concerns arise.  Patient Instructions  You'll be contacted about a referral appointment in regard to your liver cyst and right side  pain. Contact us in the meantime if needed.  Continue healthy lifestyle intervention to control blood pressure and cholesterol  We can revisit the possibility of taking a statin medicine after  discomfort and liver cyst evaluation is complete.  5-10 pound weight loss in a healthy manner would be helpful.  check lipids LFTs in 4 months and followup visit at that time to decide on possible medication intervention.     Neta Mends. Panosh M.D.

## 2013-03-02 ENCOUNTER — Telehealth: Payer: Self-pay | Admitting: Gastroenterology

## 2013-03-02 NOTE — Telephone Encounter (Signed)
lmom for pt to call back. Had COLON with Dr Christella Hartigan in 2009.

## 2013-03-03 NOTE — Telephone Encounter (Signed)
Liver cyst(s) are pretty small. Unlikely to be causing 2-3 years of chronic pain.  LFTs last month were normal.  OK to wait till 3/25

## 2013-03-03 NOTE — Telephone Encounter (Signed)
Pt has had epigastric and back  pain for years and Dr Fabian Sharp did [place] and found a liver cyst.  Pt was given an appt for 03/17/13.  Is this soon enough or do you want her double booked in with you or scheduled with an extender?

## 2013-03-03 NOTE — Telephone Encounter (Signed)
Pt.notified

## 2013-03-06 ENCOUNTER — Telehealth: Payer: Self-pay | Admitting: Gastroenterology

## 2013-03-06 NOTE — Telephone Encounter (Signed)
Pt has been rescheduled to  03/10/13 830 am she will arrive at 815 am

## 2013-03-10 ENCOUNTER — Ambulatory Visit (INDEPENDENT_AMBULATORY_CARE_PROVIDER_SITE_OTHER): Payer: 59 | Admitting: Gastroenterology

## 2013-03-10 ENCOUNTER — Encounter: Payer: Self-pay | Admitting: Gastroenterology

## 2013-03-10 VITALS — BP 138/80 | HR 72 | Ht 60.0 in | Wt 166.0 lb

## 2013-03-10 DIAGNOSIS — R1011 Right upper quadrant pain: Secondary | ICD-10-CM

## 2013-03-10 DIAGNOSIS — K7689 Other specified diseases of liver: Secondary | ICD-10-CM

## 2013-03-10 NOTE — Patient Instructions (Addendum)
MRI liver with MRCP images (evaluate liver cysts and also for RUQ pain, ?retained CBD stones). You have been scheduled for an MRI at [facility] on 03/12/13. Your appointment time is 8 am . Please arrive 15 minutes prior to your appointment time for registration purposes. There is no prep for this test. However, if you have any metal in your body, have a pacemaker or defibrillator, please be sure to let your ordering physician know. This test typically takes 45 minutes to 1 hour to complete. If this shows no clear answer to your right sided pains, then likely will recommend EGD (upper endoscopy).                                               We are excited to introduce MyChart, a new best-in-class service that provides you online access to important information in your electronic medical record. We want to make it easier for you to view your health information - all in one secure location - when and where you need it. We expect MyChart will enhance the quality of care and service we provide.  When you register for MyChart, you can:    View your test results.    Request appointments and receive appointment reminders via email.    Request medication renewals.    View your medical history, allergies, medications and immunizations.    Communicate with your physician's office through a password-protected site.    Conveniently print information such as your medication lists.  To find out if MyChart is right for you, please talk to a member of our clinical staff today. We will gladly answer your questions about this free health and wellness tool.  If you are age 61 or older and want a member of your family to have access to your record, you must provide written consent by completing a proxy form available at our office. Please speak to our clinical staff about guidelines regarding accounts for patients younger than age 61.  As you activate your MyChart account and need any technical  assistance, please call the MyChart technical support line at [phone]) or email your question to [email or web address]. If you email your question(s), please include your name, a return phone number and the best time to reach you.  If you have non-urgent health-related questions, you can send a message to our office through MyChart at [email or web address]. If you have a medical emergency, call 911.  Thank you for using MyChart as your new health and wellness resource!   MyChart licensed from [employer],  [id number]. Patents Pending.

## 2013-03-10 NOTE — Progress Notes (Signed)
HPI: This is a  very pleasant 62 year old woman whom I last saw round 5 years ago the time of a colonoscopy.  She is here with her husband today who is inquiring about a screening colonoscopy.  Has had cysts in liver since at least 2006.  HAs been having sharp piercing, brief pains (1-2 min) have been going on since she was a very young girl.  THis was in mid right sided back.  Has also been having RUQ pain, daily.  Has been going on for MANY years also (8-9). She can feel discomfort every day.  Non-radiating.  Very rare associated nausea, no vomiting.  No fevers or chills.  Eating may bring it on.  Body positions do not.    Very rare pyrosis.  No NSAIDs.    Ct scan January 2007: There are several hepatic cysts which appear stable since the prior exam as well (2006). The largest of these is in the posterior segment of the right hepatic lobe and measures 1.8 cm in   [place] 11/2008:  Liver: Several scattered hepatic cysts. No intrahepatic biliary duct dilatation.  CT scan 02/2009:  Several small hepatic cysts are again noted. One in the left lobe measuring 40 mm in diameter on image 22 has slightly enlarged compared with the prior study but remain simple in appearance. There are no suspicious liver lesions.  [place] 01/2013: Liver: The liver has a relatively normal echogenic pattern. There are liver cysts present. The cyst in the right lobe is septated measuring 2.7 x 1.4 x 1.5 cm. A cyst in the left lobe measures 1.9 x 1.5 x 1.6 cm.  bloodwork: 01/2013 normal LFTs,  LFTs have been normal for 5 years.  Colonoscopy 12/2007 for routine screening ([facility]) found lipoma in sigmoid, hemorrhoids, no polyps or cancers; recommended 10 year repeat CRC screening  02/2009 lap chole ([facility]) for cholelithiasis     Review of systems: Pertinent positive and negative review of systems were noted in the above HPI section. Complete review of systems was performed and was otherwise normal.    Past Medical History   Diagnosis Date  . HLD (hyperlipidemia)   . HTN (hypertension)   . CAD (coronary artery disease)     a. 11/2005 Cath: nonobs dzs, 30% LAD lesion on cath ;  b. 11/2005 Echo: NL EF;  c. 04/2012 Echo: EF 55-60%, Gr 2 DD;  d. 04/2012 Ex MV: EF 72%, ST depression in recovery with NL perfusion imaging - HTN response to exercise.  . Hepatic cyst     on ct [place]  . Midsternal chest pain     Past Surgical History  Procedure Laterality Date  . Vesicovaginal fistula closure w/ tah      for fibroid tumors  . Breast biopsy    . Cholecystectomy    . Child birth x 5    . Abdominal hysterectomy      fibroid tumors    Current Outpatient Prescriptions  Medication Sig Dispense Refill  . amLODipine (NORVASC) 5 MG tablet Take 1 tablet (5 mg total) by mouth daily.  30 tablet  2  . aspirin 81 MG chewable tablet Chew 81 mg by mouth daily. UAD       . Calcium Carbonate-Vitamin D (CALCIUM-VITAMIN D) 500-200 MG-UNIT per tablet Take 1 tablet by mouth 2 (two) times daily with a meal.      . losartan-hydrochlorothiazide (HYZAAR) 100-25 MG per tablet TAKE ONE TABLET BY MOUTH EVERY DAY  90 tablet  0  .  Multiple Vitamin (MULITIVITAMIN WITH MINERALS) TABS Take 1 tablet by mouth daily.       No current facility-administered medications for this visit.    Allergies as of 03/10/2013 - Review Complete 03/10/2013  Allergen Reaction Noted  . Lisinopril    . Penicillins Nausea Only 09/16/2007    Family History  Problem Relation Age of Onset  . Breast cancer Sister   . Hypertension Mother     History   Social History  . Marital Status: Married    Spouse Name: N/A    Number of Children: 5  . Years of Education: N/A   Occupational History  .     Social History Main Topics  . Smoking status: Never Smoker   . Smokeless tobacco: Never Used  . Alcohol Use: No  . Drug Use: No  . Sexually Active: Not on file   Other Topics Concern  . Not on file   Social History Narrative   Married, full time Diplomatic Services operational officer.  3 pets       6-8 hours sleep        Physical Exam: BP 138/80  Pulse 72  Ht 5' (1.524 m)  Wt 166 lb (75.297 kg)  BMI 32.42 kg/m2 Constitutional: generally well-appearing Psychiatric: alert and oriented x3 Eyes: extraocular movements intact Mouth: oral pharynx moist, no lesions Neck: supple no lymphadenopathy Cardiovascular: heart regular rate and rhythm Lungs: clear to auscultation bilaterally Abdomen: soft, nontender, nondistended, no obvious ascites, no peritoneal signs, normal bowel sounds Extremities: no lower extremity edema bilaterally Skin: no lesions on visible extremities    Assessment and plan: 62 y.o. female with  cysts in the liver, intermittent right upper quadrant discomfort, chronic back pain  First, the cysts have been present in her liver for almost 10 years now and are almost certainly causing her no symptoms. She had her gallbladder removed during an uneventful cholecystectomy about 4 years ago. Perhaps she has retained cholelithiasis. Arguing against this is the fact that her liver tests are persistently normal. I'm going to arrange for an MRI of her liver with MRCP images as well. I hope this will completely exonerate the liver cysts as well as give good evaluation of perhaps retained bile duct stones. If the imaging is unhelpful then the next step in the evaluation is EGD.

## 2013-03-11 ENCOUNTER — Other Ambulatory Visit: Payer: Self-pay | Admitting: Gastroenterology

## 2013-03-11 ENCOUNTER — Telehealth: Payer: Self-pay

## 2013-03-11 DIAGNOSIS — R1011 Right upper quadrant pain: Secondary | ICD-10-CM

## 2013-03-11 DIAGNOSIS — K7689 Other specified diseases of liver: Secondary | ICD-10-CM

## 2013-03-11 NOTE — Telephone Encounter (Signed)
Pt insurance has approval pending for MRI MRCP and appt has been moved to 03/17/13 8 am pt is aware

## 2013-03-12 ENCOUNTER — Ambulatory Visit (HOSPITAL_COMMUNITY): Admission: RE | Admit: 2013-03-12 | Payer: 59 | Source: Ambulatory Visit

## 2013-03-16 ENCOUNTER — Telehealth: Payer: Self-pay

## 2013-03-16 NOTE — Telephone Encounter (Signed)
i spoke, her insurance company denied the MRI.   Please let her know that she can get in touch with them about appealing that decision.

## 2013-03-16 NOTE — Telephone Encounter (Signed)
I spoke with the pt and she is aware of the insurance denial.  She will call the insurance and appeal.  For now the MRI will be cx and she will call back to schedule

## 2013-03-16 NOTE — Telephone Encounter (Signed)
Dr Christella Hartigan the pt has MRI MRCP tomorrow morning at 7 am, her insurance is requesting a call from you to get this covered.  Can you call [phone].

## 2013-03-17 ENCOUNTER — Ambulatory Visit (HOSPITAL_COMMUNITY): Admission: RE | Admit: 2013-03-17 | Payer: 59 | Source: Ambulatory Visit

## 2013-03-17 ENCOUNTER — Ambulatory Visit: Payer: 59 | Admitting: Gastroenterology

## 2013-05-11 ENCOUNTER — Other Ambulatory Visit: Payer: Self-pay | Admitting: Internal Medicine

## 2013-06-30 ENCOUNTER — Ambulatory Visit (INDEPENDENT_AMBULATORY_CARE_PROVIDER_SITE_OTHER): Payer: 59 | Admitting: Internal Medicine

## 2013-06-30 ENCOUNTER — Encounter: Payer: Self-pay | Admitting: Internal Medicine

## 2013-06-30 VITALS — BP 150/82 | HR 77 | Temp 98.2°F | Wt 171.0 lb

## 2013-06-30 DIAGNOSIS — M25519 Pain in unspecified shoulder: Secondary | ICD-10-CM

## 2013-06-30 DIAGNOSIS — I10 Essential (primary) hypertension: Secondary | ICD-10-CM

## 2013-06-30 DIAGNOSIS — E785 Hyperlipidemia, unspecified: Secondary | ICD-10-CM

## 2013-06-30 DIAGNOSIS — M25511 Pain in right shoulder: Secondary | ICD-10-CM

## 2013-06-30 MED ORDER — AMLODIPINE BESYLATE 5 MG PO TABS: 5.0000 mg | ORAL_TABLET | Freq: Every day | ORAL | Status: DC

## 2013-06-30 MED ORDER — NAPROXEN 500 MG PO TABS: ORAL_TABLET | ORAL | Status: DC

## 2013-06-30 NOTE — Patient Instructions (Addendum)
  I  think the shoulder   Is a bursitis or tendinitis. or shoulder arthritis  .  Since it didn't get better with antiinflammatories    We can try a different one in the meantime and get   A consult opinion.  consider physical therapy etc.  You blood pressure is elevated.  Restart the amlodipine  For the high readings as we discussed and you should be able to take at the same time as your other med.  Contact us with ? In the future or your pharmacist.        Contact us about readings in 1-2 months  . IF controlled then ROV in 5-6 months  Bring in machine.    Shoulder Pain The shoulder is the joint that connects your arms to your body. The bones that form the shoulder joint include the upper arm bone (humerus), the shoulder blade (scapula), and the collarbone (clavicle). The top of the humerus is shaped like a ball and fits into a rather flat socket on the scapula (glenoid cavity). A combination of muscles and strong, fibrous tissues that connect muscles to bones (tendons) support your shoulder joint and hold the ball in the socket. Small, fluid-filled sacs (bursae) are located in different areas of the joint. They act as cushions between the bones and the overlying soft tissues and help reduce friction between the gliding tendons and the bone as you move your arm. Your shoulder joint allows a wide range of motion in your arm. This range of motion allows you to do things like scratch your back or throw a ball. However, this range of motion also makes your shoulder more prone to pain from overuse and injury. Causes of shoulder pain can originate from both injury and overuse and usually can be grouped in the following four categories:  Redness, swelling, and pain (inflammation) of the tendon (tendinitis) or the bursae (bursitis).  Instability, such as a dislocation of the joint.  Inflammation of the joint (arthritis).  Broken bone (fracture). HOME CARE INSTRUCTIONS   Apply ice to the sore  area.  Put ice in a plastic bag.  Place a towel between your skin and the bag.  Leave the ice on for 15-20 minutes, 3-4 times per day for the first 2 days.  Stop using cold packs if they do not help with the pain.  If you have a shoulder sling or immobilizer, wear it as long as your caregiver instructs. Only remove it to shower or bathe. Move your arm as little as possible, but keep your hand moving to prevent swelling.  Squeeze a soft ball or foam pad as much as possible to help prevent swelling.  Only take over-the-counter or prescription medicines for pain, discomfort, or fever as directed by your caregiver. SEEK MEDICAL CARE IF:   Your shoulder pain increases, or new pain develops in your arm, hand, or fingers.  Your hand or fingers become cold and numb.  Your pain is not relieved with medicines. SEEK IMMEDIATE MEDICAL CARE IF:   Your arm, hand, or fingers are numb or tingling.  Your arm, hand, or fingers are significantly swollen or turn white or blue. MAKE SURE YOU:   Understand these instructions.  Will watch your condition.  Will get help right away if you are not doing well or get worse. Document Released: 09/19/2005 Document Revised: 09/03/2012 Document Reviewed: 11/24/2011 [facility] Patient Information 2014 [place], [place].

## 2013-06-30 NOTE — Progress Notes (Signed)
Chief Complaint  Patient presents with  . Follow-up    Complains of pain in her rt arm.  Started 3-4 months ago.    HPI: Patient comes in today for follow up Bp  But has new problem .   HT:bp 129/79  Recent check last week.  However forgot to breing in monitor ,  Stopped the amlodipine after ? Months  Stopped the amlodipine because   bloodpressure was normal.  Had some issues.  Heard someone  [place] take  With other meds other s. No se per se.  No cp sob   LIPIDS declines medications  NEW problem onset right shoulder Arm pain : in constant   Pain.   Lasting  Months   .   Taking advil    Once a day.    Helps some   Not a lot .  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 Hurts to do this so using left arm  .   No injury otherwise but very remote hx of some pain that resolved with a prescription medication.      ROS: See pertinent positives and negatives per HPI. abd pain better comes and goes   prob not from liver cysts insuance wouldn't pay for mri of liver as advised.  Past Medical History  Diagnosis Date  . HLD (hyperlipidemia)   . HTN (hypertension)   . CAD (coronary artery disease)     a. 11/2005 Cath: nonobs dzs, 30% LAD lesion on cath ;  b. 11/2005 Echo: NL EF;  c. 04/2012 Echo: EF 55-60%, Gr 2 DD;  d. 04/2012 Ex MV: EF 72%, ST depression in recovery with NL perfusion imaging - HTN response to exercise.  . Hepatic cyst     on ct [place]  . Midsternal chest pain     Family History  Problem Relation Age of Onset  . Breast cancer Sister   . Hypertension Mother     History   Social History  . Marital Status: Married    Spouse Name: N/A    Number of Children: 5  . Years of Education: N/A   Occupational History  .     Social History Main Topics  . Smoking status: Never Smoker   . Smokeless tobacco: Never Used  . Alcohol Use: No  . Drug Use: No  . Sexually Active: None   Other Topics Concern  . None   Social History Narrative    Married, full time Diplomatic Services operational officer. 3 pets       6-8 hours sleep     Outpatient Encounter Prescriptions as of 06/30/2013  Medication Sig Dispense Refill  . aspirin 81 MG chewable tablet Chew 81 mg by mouth daily. UAD       . Calcium Carbonate-Vitamin D (CALCIUM-VITAMIN D) 500-200 MG-UNIT per tablet Take 1 tablet by mouth 2 (two) times daily with a meal.      . losartan-hydrochlorothiazide (HYZAAR) 100-25 MG per tablet TAKE ONE TABLET BY MOUTH ONCE DAILY  90 tablet  1  . Multiple Vitamin (MULITIVITAMIN WITH MINERALS) TABS Take 1 tablet by mouth daily.      Marland Kitchen amLODipine (NORVASC) 5 MG tablet Take 1 tablet (5 mg total) by mouth daily.  30 tablet  11  . naproxen (NAPROSYN) 500 MG tablet Take 1 po bid for 2 weeks  With food and then as directed  40 tablet  2  . [DISCONTINUED]  amLODipine (NORVASC) 5 MG tablet Take 1 tablet (5 mg total) by mouth daily.  30 tablet  2   No facility-administered encounter medications on file as of 06/30/2013.    EXAM:  BP 150/82  Pulse 77  Temp(Src) 98.2 F (36.8 C) (Oral)  Wt 171 lb (77.565 kg)  BMI 33.4 kg/m2  SpO2 98%  Body mass index is 33.4 kg/(m^2).  GENERAL: vitals reviewed and listed above, alert, oriented, appears well hydrated and in no acute distress  HEENT: atraumatic, conjunctiva  clear, no obvious abnormalities on inspection of external nose and ears NECK: no obvious masses on inspection palpation  No bruit heard  LUNGS: clear to auscultation bilaterally, no wheezes, rales or rhonchi, good air movement CV: HRRR, no clubbing cyanosis or  peripheral edema nl cap refill pulses = both UE  MS: moves all extremities  Dec ROM right shoulder  elefation above 90 deg and int external rotation   Pain at [facility] ant and ? Ac area   No swelling  Neck normal  PSYCH: pleasant and cooperative, no obvious depression or anxiety  ASSESSMENT AND PLAN:  Discussed the following assessment and plan:  HYPERTENSION - up today compliant needs better control if accurate    stopped med for ? reasons disc this  at this time restart amlodipine and send in bp readings by My chart - Plan: amLODipine (NORVASC) 5 MG tablet  HYPERLIPIDEMIA - needs improvement doesnt want to take statin mediccatoin will readress  at cpx  - Plan: amLODipine (NORVASC) 5 MG tablet  Right shoulder pain - months  busitis vs tendoinits vs impingement  is right handed sub op response to nsaid and send for more eval and rx.  - Plan: Ambulatory referral to Orthopedic Surgery  Risk benefit of medication discussed. Stop asa on nsaid  -Patient advised to return or notify health care team  if symptoms worsen or persist or new concerns arise.  Patient Instructions   I  think the shoulder   Is a bursitis or tendinitis. or shoulder arthritis  .  Since it didn't get better with antiinflammatories    We can try a different one in the meantime and get   A consult opinion.  consider physical therapy etc.  You blood pressure is elevated.  Restart the amlodipine  For the high readings as we discussed and you should be able to take at the same time as your other med.  Contact us with ? In the future or your pharmacist.        Contact us about readings in 1-2 months  . IF controlled then ROV in 5-6 months  Bring in machine.    Shoulder Pain The shoulder is the joint that connects your arms to your body. The bones that form the shoulder joint include the upper arm bone (humerus), the shoulder blade (scapula), and the collarbone (clavicle). The top of the humerus is shaped like a ball and fits into a rather flat socket on the scapula (glenoid cavity). A combination of muscles and strong, fibrous tissues that connect muscles to bones (tendons) support your shoulder joint and hold the ball in the socket. Small, fluid-filled sacs (bursae) are located in different areas of the joint. They act as cushions between the bones and the overlying soft tissues and help reduce friction between the gliding tendons and the bone as  you move your arm. Your shoulder joint allows a wide range of motion in your arm. This range of motion allows you to  do things like scratch your back or throw a ball. However, this range of motion also makes your shoulder more prone to pain from overuse and injury. Causes of shoulder pain can originate from both injury and overuse and usually can be grouped in the following four categories:  Redness, swelling, and pain (inflammation) of the tendon (tendinitis) or the bursae (bursitis).  Instability, such as a dislocation of the joint.  Inflammation of the joint (arthritis).  Broken bone (fracture). HOME CARE INSTRUCTIONS   Apply ice to the sore area.  Put ice in a plastic bag.  Place a towel between your skin and the bag.  Leave the ice on for 15-20 minutes, 3-4 times per day for the first 2 days.  Stop using cold packs if they do not help with the pain.  If you have a shoulder sling or immobilizer, wear it as long as your caregiver instructs. Only remove it to shower or bathe. Move your arm as little as possible, but keep your hand moving to prevent swelling.  Squeeze a soft ball or foam pad as much as possible to help prevent swelling.  Only take over-the-counter or prescription medicines for pain, discomfort, or fever as directed by your caregiver. SEEK MEDICAL CARE IF:   Your shoulder pain increases, or new pain develops in your arm, hand, or fingers.  Your hand or fingers become cold and numb.  Your pain is not relieved with medicines. SEEK IMMEDIATE MEDICAL CARE IF:   Your arm, hand, or fingers are numb or tingling.  Your arm, hand, or fingers are significantly swollen or turn white or blue. MAKE SURE YOU:   Understand these instructions.  Will watch your condition.  Will get help right away if you are not doing well or get worse. Document Released: 09/19/2005 Document Revised: 09/03/2012 Document Reviewed: 11/24/2011 [facility] Patient Information 2014 [place],  [place].      Neta Mends. Bralon Antkowiak M.D.

## 2013-07-07 ENCOUNTER — Encounter: Payer: Self-pay | Admitting: Internal Medicine

## 2013-10-03 ENCOUNTER — Emergency Department (INDEPENDENT_AMBULATORY_CARE_PROVIDER_SITE_OTHER)
Admission: EM | Admit: 2013-10-03 | Discharge: 2013-10-03 | Disposition: A | Discharge status: Home / Self Care | Payer: 59 | Source: Home / Self Care | Attending: Family Medicine | Admitting: Family Medicine

## 2013-10-03 ENCOUNTER — Encounter (HOSPITAL_COMMUNITY): Payer: Self-pay | Admitting: Emergency Medicine

## 2013-10-03 DIAGNOSIS — M25819 Other specified joint disorders, unspecified shoulder: Secondary | ICD-10-CM

## 2013-10-03 DIAGNOSIS — M7541 Impingement syndrome of right shoulder: Secondary | ICD-10-CM

## 2013-10-03 MED ORDER — KETOROLAC TROMETHAMINE 60 MG/2ML IM SOLN
60.0000 mg | Freq: Once | INTRAMUSCULAR | Status: AC
  Administered 2013-10-03: 60 mg via INTRAMUSCULAR

## 2013-10-03 MED ORDER — KETOROLAC TROMETHAMINE 60 MG/2ML IM SOLN
INTRAMUSCULAR | Status: AC
  Filled 2013-10-03: qty 2

## 2013-10-03 MED ORDER — DICLOFENAC SODIUM 1 % TD GEL: 2.0000 g | Freq: Four times a day (QID) | TRANSDERMAL | Status: DC

## 2013-10-03 NOTE — ED Provider Notes (Signed)
CSN: [id number]     Arrival date & time 10/03/13  0902 History   First MD Initiated Contact with Patient 10/03/13 [phone]     Chief Complaint  Patient presents with  . Shoulder Pain   (Consider location/radiation/quality/duration/timing/severity/associated sxs/prior Treatment) HPI Comments: The patient presents today with complaint of right shoulder pain patient states her right shoulder has been hurting her for approximately 6 months. Patient states was seen by her primary care provider and referred to a specialist Dr. Amie Adams. Patient states that she was told to take Naprosyn and Aleve, but it causes significant GI upset which she characterizes as "stomach pain". 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 Patient reports that the discomfort is a 10/10 with movement but 1/10 if she keeps her shoulder still.  Patient is a 62 y.o. female presenting with shoulder pain.  Shoulder Pain This is a chronic problem. The current episode started more than 1 week ago. The problem has been gradually worsening. Associated symptoms include abdominal pain. Pertinent negatives include no chest pain, no headaches and no shortness of breath. Exacerbated by: Movement. The symptoms are relieved by rest.    Past Medical History  Diagnosis Date  . HLD (hyperlipidemia)   . HTN (hypertension)   . CAD (coronary artery disease)     a. 11/2005 Cath: nonobs dzs, 30% LAD lesion on cath ;  b. 11/2005 Echo: NL EF;  c. 04/2012 Echo: EF 55-60%, Gr 2 DD;  d. 04/2012 Ex MV: EF 72%, ST depression in recovery with NL perfusion imaging - HTN response to exercise.  . Hepatic cyst     on ct [place]  . Midsternal chest pain    Past Surgical History  Procedure Laterality Date  . Vesicovaginal fistula closure w/ tah      for fibroid tumors   . Breast biopsy    . Cholecystectomy    . Child birth x 5    . Abdominal hysterectomy      fibroid tumors   Family History  Problem Relation Age of Onset  . Breast cancer Sister   . Hypertension Mother    History  Substance Use Topics  . Smoking status: Never Smoker   . Smokeless tobacco: Never Used  . Alcohol Use: No   OB History   Grav Para Term Preterm Abortions TAB SAB Ect Mult Living                 Review of Systems  Constitutional: Negative.   HENT: Negative.   Eyes: Negative.   Respiratory: Negative.  Negative for shortness of breath.   Cardiovascular: Negative.  Negative for chest pain.  Gastrointestinal: Positive for abdominal pain.       Reports abdominal pain with use NSAIDS.  Endocrine: Negative.   Genitourinary: Negative.   Musculoskeletal:       R shoulder pain with movement x 6 months.  Skin: Negative.   Allergic/Immunologic: Negative.   Neurological: Negative.  Negative for headaches.  Hematological: Negative.   Psychiatric/Behavioral: Negative.     Allergies  Lisinopril and Penicillins  Home Medications   Current Outpatient Rx  Name  Route  Sig  Dispense  Refill  . amLODipine (NORVASC) 5 MG tablet   Oral   Take 1 tablet (5 mg total) by mouth daily.  30 tablet   11   . aspirin 81 MG chewable tablet   Oral   Chew 81 mg by mouth daily. UAD          . Calcium Carbonate-Vitamin D (CALCIUM-VITAMIN D) 500-200 MG-UNIT per tablet   Oral   Take 1 tablet by mouth 2 (two) times daily with a meal.         . losartan-hydrochlorothiazide (HYZAAR) 100-25 MG per tablet      TAKE ONE TABLET BY MOUTH ONCE DAILY   90 tablet   1   . Multiple Vitamin (MULITIVITAMIN WITH MINERALS) TABS   Oral   Take 1 tablet by mouth daily.         . naproxen (NAPROSYN) 500 MG tablet      Take 1 po bid for 2 weeks  With food and then as directed   40 tablet   2   . diclofenac sodium (VOLTAREN) 1 % GEL   Topical   Apply 2 g topically 4 (four) times  daily.   100 g   0    BP 135/61  Pulse 83  Temp(Src) 98.7 F (37.1 C) (Oral)  Resp 18  SpO2 100% Physical Exam  Nursing note and vitals reviewed. Constitutional: She is oriented to person, place, and time. She appears well-developed and well-nourished. No distress.  HENT:  Head: Normocephalic and atraumatic.  Eyes: EOM are normal. Pupils are equal, round, and reactive to light. Right eye exhibits no discharge. Left eye exhibits no discharge. No scleral icterus.  Neck: Normal range of motion. Neck supple. No tracheal deviation present.  No nuchal rigidity.  Cardiovascular: Normal rate, regular rhythm, normal heart sounds and intact distal pulses.  Exam reveals no gallop and no friction rub.   No murmur heard. Pulmonary/Chest: Effort normal and breath sounds normal. No respiratory distress. She has no wheezes. She has no rales. She exhibits no tenderness.  Musculoskeletal: Normal range of motion. She exhibits no edema and no tenderness.  There is no obvious asymmetry or deformity of the right shoulder compared to the left shoulder. No surface trauma, ecchymosis, or crepitus. No bony deformity or prominence of the humeral head. No erythema warmth or swelling. No tenderness to palpation over the clavicle, A-C joint, acromion process, scapular or humeral head. There is tenderness to palpation at the bicipital groove and over the bursa. There is pain with active and passive abduction and adduction and internal and external rotation with flexion and extension. No axillary tenderness or lymphadenopathy. Normal sensation over the deltoid inability to flex arm and elbow indicates intact axillary nerve function. Distal motor and neurovascular status is intact.  Neurological: She is alert and oriented to person, place, and time. She has normal reflexes. No cranial nerve deficit.  Skin: Skin is warm and dry. No rash noted. She is not diaphoretic. No erythema. No pallor.  Psychiatric: She has a normal  mood and affect. Her behavior is normal.    ED Course  Procedures (including critical care time) Labs Review Labs Reviewed - No data to display Imaging Review No results found.  EKG Interpretation     Ventricular Rate:    PR Interval:    QRS Duration:   QT Interval:    QTC Calculation:   R Axis:     Text Interpretation:              MDM   1. Shoulder impingement, right    Meds ordered this encounter  Medications  . ketorolac (  TORADOL) injection 60 mg    Sig:   . diclofenac sodium (VOLTAREN) 1 % GEL    Sig: Apply 2 g topically 4 (four) times daily.    Dispense:  100 g    Refill:  0    Plan of care reviewed and discussed with Dr. Artis Flock. Patient verbalizes understanding the plan of care and need for followup this week.    Weber Cooks, NP 10/03/13 [phone]

## 2013-10-03 NOTE — ED Notes (Addendum)
C/o right shoulder pain.  Patient has seen a specialist and has had appt with PCP.   Patient states she is in pain and just wants to be seen

## 2013-10-04 NOTE — ED Provider Notes (Signed)
Medical screening examination/treatment/procedure(s) were performed by resident physician or non-physician practitioner and as supervising physician I was immediately available for consultation/collaboration.   Barkley Bruns MD.   Linna Hoff, MD 10/04/13 1242

## 2013-10-12 ENCOUNTER — Other Ambulatory Visit (HOSPITAL_COMMUNITY): Payer: Self-pay | Admitting: Orthopaedic Surgery

## 2013-10-12 DIAGNOSIS — M25511 Pain in right shoulder: Secondary | ICD-10-CM

## 2013-10-15 ENCOUNTER — Other Ambulatory Visit (HOSPITAL_COMMUNITY): Payer: Self-pay | Admitting: Orthopaedic Surgery

## 2013-10-15 DIAGNOSIS — M25511 Pain in right shoulder: Secondary | ICD-10-CM

## 2013-10-16 ENCOUNTER — Ambulatory Visit (HOSPITAL_COMMUNITY): Admission: RE | Admit: 2013-10-16 | Payer: 59 | Source: Ambulatory Visit

## 2013-10-16 ENCOUNTER — Ambulatory Visit (HOSPITAL_COMMUNITY)
Admission: RE | Admit: 2013-10-16 | Discharge: 2013-10-16 | Disposition: A | Discharge status: Home / Self Care | Payer: 59 | Source: Ambulatory Visit | Attending: Orthopaedic Surgery | Admitting: Orthopaedic Surgery

## 2013-10-16 DIAGNOSIS — M6790 Unspecified disorder of synovium and tendon, unspecified site: Secondary | ICD-10-CM | POA: Insufficient documentation

## 2013-10-16 DIAGNOSIS — M19019 Primary osteoarthritis, unspecified shoulder: Secondary | ICD-10-CM | POA: Insufficient documentation

## 2013-10-16 DIAGNOSIS — IMO0002 Reserved for concepts with insufficient information to code with codable children: Secondary | ICD-10-CM | POA: Insufficient documentation

## 2013-10-16 DIAGNOSIS — M898X9 Other specified disorders of bone, unspecified site: Secondary | ICD-10-CM | POA: Insufficient documentation

## 2013-10-16 DIAGNOSIS — M719 Bursopathy, unspecified: Secondary | ICD-10-CM | POA: Insufficient documentation

## 2013-10-16 DIAGNOSIS — M75100 Unspecified rotator cuff tear or rupture of unspecified shoulder, not specified as traumatic: Secondary | ICD-10-CM | POA: Insufficient documentation

## 2013-10-16 DIAGNOSIS — M25511 Pain in right shoulder: Secondary | ICD-10-CM

## 2013-10-16 DIAGNOSIS — M24119 Other articular cartilage disorders, unspecified shoulder: Secondary | ICD-10-CM | POA: Insufficient documentation

## 2013-10-16 DIAGNOSIS — M67919 Unspecified disorder of synovium and tendon, unspecified shoulder: Secondary | ICD-10-CM | POA: Insufficient documentation

## 2013-10-27 ENCOUNTER — Encounter (HOSPITAL_COMMUNITY): Payer: Self-pay | Admitting: Pharmacy Technician

## 2013-10-28 ENCOUNTER — Other Ambulatory Visit (HOSPITAL_COMMUNITY): Payer: Self-pay

## 2013-10-29 ENCOUNTER — Encounter (HOSPITAL_COMMUNITY): Payer: Self-pay

## 2013-10-29 ENCOUNTER — Other Ambulatory Visit: Payer: Self-pay

## 2013-10-29 ENCOUNTER — Encounter (HOSPITAL_COMMUNITY)
Admission: RE | Admit: 2013-10-29 | Discharge: 2013-10-29 | Disposition: A | Discharge status: Home / Self Care | Payer: 59 | Source: Ambulatory Visit | Attending: Orthopaedic Surgery | Admitting: Orthopaedic Surgery

## 2013-10-29 DIAGNOSIS — Z01812 Encounter for preprocedural laboratory examination: Secondary | ICD-10-CM | POA: Insufficient documentation

## 2013-10-29 DIAGNOSIS — Z01810 Encounter for preprocedural cardiovascular examination: Secondary | ICD-10-CM | POA: Insufficient documentation

## 2013-10-29 DIAGNOSIS — Z01818 Encounter for other preprocedural examination: Secondary | ICD-10-CM | POA: Insufficient documentation

## 2013-10-29 LAB — CBC
HCT: 39.2 % (ref 36.0–46.0)
Platelets: 290 10*3/uL (ref 150–400)
RDW: 13.3 % (ref 11.5–15.5)
WBC: 5.2 10*3/uL (ref 4.0–10.5)

## 2013-10-29 LAB — BASIC METABOLIC PANEL
Calcium: 10 mg/dL (ref 8.4–10.5)
Chloride: 104 mEq/L (ref 96–112)
Creatinine, Ser: 1.08 mg/dL (ref 0.50–1.10)
GFR calc Af Amer: 63 mL/min — ABNORMAL LOW (ref >90)
Potassium: 3.5 mEq/L (ref 3.5–5.1)

## 2013-10-29 NOTE — Pre-Procedure Instructions (Signed)
Kaylee Adams  10/29/2013   Your procedure is scheduled on:  Tuesday November 11 th at 1517 PM  Report to [facility] Main Entrance "A"at 1317 PM.  Call this number if you have problems the morning of surgery: [phone]   Remember:   Do not eat food or drink liquids after midnight.   Take these medicines the morning of surgery with A SIP OF WATER: Amlodipine and Tramadol if needed for pain.  Stop Meloxicam (Mobic)  today 10/29/13   Do not wear jewelry, make-up or nail polish.  Do not wear lotions, powders, or perfumes. You may wear deodorant.  Do not shave 48 hours prior to surgery.  Do not bring valuables to the hospital.  [facility] is not responsible  for any belongings or valuables.               Contacts, dentures or bridgework may not be worn into surgery.  Leave suitcase in the car. After surgery it may be brought to your room.  For patients admitted to the hospital, discharge time is determined by your treatment team.               Patients discharged the day of surgery will not be allowed to drive home.  Name and phone number of your driver:  Special Instructions: Shower using CHG 2 nights before surgery and the night before surgery.  If you shower the day of surgery use CHG.  Use special wash - you have one bottle of CHG for all showers.  You should use approximately 1/3 of the bottle for each shower.   Please read over the following fact sheets that you were given: Pain Booklet, Coughing and Deep Breathing and Surgical Site Infection Prevention

## 2013-10-30 ENCOUNTER — Encounter (HOSPITAL_COMMUNITY): Payer: Self-pay

## 2013-10-30 NOTE — Progress Notes (Signed)
Anesthesia Chart Review:  Patient is a 62 year old female scheduled for right shoulder arthroscopy, rotator cuff repair on 11/03/13 by Dr. Doneen Poisson.  History includes obesity, non-smoker, mild non-obstructive CAD by 2006 cath, HTN, HLD, hepatic cyst, cholecystectomy, hysterectomy. PCP is listed as Dr. Fabian Sharp. She was seen by Nicolasa Ducking, NP for Dr. Dietrich Pates in May 2013 for chest pain but is not necessarily seen on a yearly basis.  She had a non-ischemic stress test at that time.  Echo on 05/16/12 showed: - Left ventricle: The cavity size was normal. Wall thickness was increased in a pattern of moderate LVH. Systolic function was normal. The estimated ejection fraction was in the range of 55% to 60%. Wall motion was normal; there were no regional wall motion abnormalities. Features are consistent with a pseudonormal left ventricular filling pattern, with concomitant abnormal relaxation and increased filling pressure (grade 2 diastolic dysfunction). - Left atrium: The atrium was mildly dilated. - Trivial MR, TR, PR.  Nuclear stress test on 05/22/12 showed: Low risk stress nuclear study. Normal nuclear images. Markedly positive ECG with signficant resting and exercise induced HTN. Baseline ECG LVH. LV Ejection Fraction: 72%. LV Wall Motion: NL LV Function; NL Wall Motion. (This was reviewed by Dr. Tenny Craw.  By her comments, it seems to have also been done as part of a preoperative evaluation as she states, "OK to proceed.")  Cardiac cath on 11/27/05 showed: 1. Preserved overall left ventricular function.  2. No evidence of aortic regurgitation or dissection.  3. A 30% calcified ostial irregularity of the left main carefully described in the angiographic study.  4. Mild irregularity of proximal left anterior descending artery without significant focal narrowing.  EKG on 10/29/13 showed NSR, possible LAE, LVH, non-specific T wave abnormality.  It was not felt significantly changed from  prior.  She had a negative CXR on 02/13/13.   Preoperative labs noted.  Patient's EKG appears stable and she had a non-ischemic stress test within the past two years.  She will be evaluated by her assigned anesthesiologist on the day of surgery but if no new CV symptoms then I would anticipate that she could proceed as planned.  Velna Ochs [facility] Short Stay Center/Anesthesiology Phone [phone] 10/30/2013 11:31 AM

## 2013-11-02 MED ORDER — CLINDAMYCIN PHOSPHATE 900 MG/50ML IV SOLN
900.0000 mg | INTRAVENOUS | Status: AC
  Administered 2013-11-03: 900 mg via INTRAVENOUS
  Filled 2013-11-02: qty 50

## 2013-11-02 NOTE — Progress Notes (Signed)
Spoke with pt and instructed pt to arrive at 1215 tomorrow with stated understanding by pt.

## 2013-11-03 ENCOUNTER — Encounter (HOSPITAL_COMMUNITY): Payer: 59 | Admitting: Vascular Surgery

## 2013-11-03 ENCOUNTER — Ambulatory Visit (HOSPITAL_COMMUNITY)
Admission: RE | Admit: 2013-11-03 | Discharge: 2013-11-03 | Disposition: A | Discharge status: Home / Self Care | Payer: 59 | Source: Ambulatory Visit | Attending: Orthopaedic Surgery | Admitting: Orthopaedic Surgery

## 2013-11-03 ENCOUNTER — Encounter (HOSPITAL_COMMUNITY)
Admission: RE | Disposition: A | Discharge status: Home / Self Care | Payer: Self-pay | Source: Ambulatory Visit | Attending: Orthopaedic Surgery

## 2013-11-03 ENCOUNTER — Encounter (HOSPITAL_COMMUNITY): Payer: Self-pay | Admitting: *Deleted

## 2013-11-03 ENCOUNTER — Ambulatory Visit (HOSPITAL_COMMUNITY): Payer: 59 | Admitting: Anesthesiology

## 2013-11-03 DIAGNOSIS — I10 Essential (primary) hypertension: Secondary | ICD-10-CM | POA: Insufficient documentation

## 2013-11-03 DIAGNOSIS — E785 Hyperlipidemia, unspecified: Secondary | ICD-10-CM | POA: Insufficient documentation

## 2013-11-03 DIAGNOSIS — M75121 Complete rotator cuff tear or rupture of right shoulder, not specified as traumatic: Secondary | ICD-10-CM

## 2013-11-03 DIAGNOSIS — M75120 Complete rotator cuff tear or rupture of unspecified shoulder, not specified as traumatic: Secondary | ICD-10-CM | POA: Insufficient documentation

## 2013-11-03 DIAGNOSIS — I2510 Atherosclerotic heart disease of native coronary artery without angina pectoris: Secondary | ICD-10-CM | POA: Insufficient documentation

## 2013-11-03 DIAGNOSIS — M25819 Other specified joint disorders, unspecified shoulder: Secondary | ICD-10-CM | POA: Insufficient documentation

## 2013-11-03 HISTORY — PX: SHOULDER ARTHROSCOPY WITH ROTATOR CUFF REPAIR AND SUBACROMIAL DECOMPRESSION: SHX5686

## 2013-11-03 SURGERY — SHOULDER ARTHROSCOPY WITH ROTATOR CUFF REPAIR AND SUBACROMIAL DECOMPRESSION
Anesthesia: General | Site: Shoulder | Laterality: Right | Wound class: Clean

## 2013-11-03 MED ORDER — ONDANSETRON HCL 4 MG/2ML IJ SOLN
4.0000 mg | Freq: Once | INTRAMUSCULAR | Status: AC
  Administered 2013-11-03: 4 mg via INTRAVENOUS

## 2013-11-03 MED ORDER — ALBUMIN HUMAN 5 % IV SOLN
INTRAVENOUS | Status: DC | PRN
  Administered 2013-11-03: 16:00:00 via INTRAVENOUS

## 2013-11-03 MED ORDER — DEXTROSE 5 % IV SOLN
INTRAVENOUS | Status: DC | PRN
  Administered 2013-11-03: 15:00:00 via INTRAVENOUS

## 2013-11-03 MED ORDER — ROCURONIUM BROMIDE 100 MG/10ML IV SOLN
INTRAVENOUS | Status: DC | PRN
  Administered 2013-11-03: 50 mg via INTRAVENOUS

## 2013-11-03 MED ORDER — MIDAZOLAM HCL 5 MG/5ML IJ SOLN
INTRAMUSCULAR | Status: DC | PRN
  Administered 2013-11-03: 2 mg via INTRAVENOUS

## 2013-11-03 MED ORDER — ONDANSETRON HCL 4 MG/2ML IJ SOLN
INTRAMUSCULAR | Status: DC | PRN
  Administered 2013-11-03: 4 mg via INTRAVENOUS

## 2013-11-03 MED ORDER — LIDOCAINE HCL (CARDIAC) 20 MG/ML IV SOLN
INTRAVENOUS | Status: DC | PRN
  Administered 2013-11-03: 20 mg via INTRAVENOUS

## 2013-11-03 MED ORDER — BUPIVACAINE-EPINEPHRINE PF 0.5-1:200000 % IJ SOLN
INTRAMUSCULAR | Status: DC | PRN
  Administered 2013-11-03: 30 mL

## 2013-11-03 MED ORDER — GLYCOPYRROLATE 0.2 MG/ML IJ SOLN
INTRAMUSCULAR | Status: DC | PRN
  Administered 2013-11-03: .6 mg via INTRAVENOUS

## 2013-11-03 MED ORDER — PROPOFOL 10 MG/ML IV BOLUS
INTRAVENOUS | Status: DC | PRN
  Administered 2013-11-03: 200 mg via INTRAVENOUS

## 2013-11-03 MED ORDER — OXYCODONE-ACETAMINOPHEN 5-325 MG PO TABS: 1.0000 | ORAL_TABLET | ORAL | Status: DC | PRN

## 2013-11-03 MED ORDER — PHENYLEPHRINE HCL 10 MG/ML IJ SOLN
10.0000 mg | INTRAVENOUS | Status: DC | PRN
  Administered 2013-11-03: 120 ug/min via INTRAVENOUS

## 2013-11-03 MED ORDER — NEOSTIGMINE METHYLSULFATE 1 MG/ML IJ SOLN
INTRAMUSCULAR | Status: DC | PRN
  Administered 2013-11-03: 4 mg via INTRAVENOUS

## 2013-11-03 MED ORDER — ONDANSETRON HCL 4 MG/2ML IJ SOLN
INTRAMUSCULAR | Status: AC
  Filled 2013-11-03: qty 2

## 2013-11-03 MED ORDER — PHENYLEPHRINE HCL 10 MG/ML IJ SOLN
INTRAMUSCULAR | Status: DC | PRN
  Administered 2013-11-03: 160 ug via INTRAVENOUS

## 2013-11-03 MED ORDER — METHOCARBAMOL 500 MG PO TABS: 500.0000 mg | ORAL_TABLET | Freq: Four times a day (QID) | ORAL | Status: DC | PRN

## 2013-11-03 MED ORDER — SODIUM CHLORIDE 0.9 % IR SOLN
Status: DC | PRN
  Administered 2013-11-03: 6000 mL

## 2013-11-03 MED ORDER — LACTATED RINGERS IV SOLN
INTRAVENOUS | Status: DC
  Administered 2013-11-03: 50 mL/h via INTRAVENOUS
  Administered 2013-11-03 (×2): via INTRAVENOUS

## 2013-11-03 MED ORDER — ARTIFICIAL TEARS OP OINT
TOPICAL_OINTMENT | OPHTHALMIC | Status: DC | PRN
  Administered 2013-11-03: 1 via OPHTHALMIC

## 2013-11-03 MED ORDER — FENTANYL CITRATE 0.05 MG/ML IJ SOLN
INTRAMUSCULAR | Status: DC | PRN
  Administered 2013-11-03: 150 ug via INTRAVENOUS
  Administered 2013-11-03: 100 ug via INTRAVENOUS

## 2013-11-03 SURGICAL SUPPLY — 60 items
ANCH SUT 4.5 SUBCORTICAL WNG (Anchor) ×1 IMPLANT
ANCHOR SUT CROSSFT 4.5 #2 (Anchor) ×2 IMPLANT
ANCHOR SUT POPLOK 4.5 (Anchor) ×2 IMPLANT
BLADE CUDA 4.2 (BLADE) ×2 IMPLANT
BLADE SURG 11 STRL SS (BLADE) ×2 IMPLANT
BLADE SURG ROTATE 9660 (MISCELLANEOUS) IMPLANT
BUR VERTEX HOODED 4.5 (BURR) IMPLANT
CANNULA 5.75X7 CRYSTAL CLEAR (CANNULA) IMPLANT
CANNULA 5.75X71 LONG (CANNULA) ×2 IMPLANT
CANNULA SHOULDER 7CM (CANNULA) ×4 IMPLANT
CANNULA TWIST IN 8.25X7CM (CANNULA) ×1 IMPLANT
CLOTH BEACON ORANGE TIMEOUT ST (SAFETY) ×2 IMPLANT
COVER SURGICAL LIGHT HANDLE (MISCELLANEOUS) ×2 IMPLANT
DECANTER SPIKE VIAL GLASS SM (MISCELLANEOUS) IMPLANT
DRAPE INCISE IOBAN 66X45 STRL (DRAPES) ×1 IMPLANT
DRAPE SHOULDER BEACH CHAIR (DRAPES) ×2 IMPLANT
DRAPE SURG 17X23 STRL (DRAPES) ×2 IMPLANT
DRAPE U-SHAPE 47X51 STRL (DRAPES) ×2 IMPLANT
DRSG PAD ABDOMINAL 8X10 ST (GAUZE/BANDAGES/DRESSINGS) ×3 IMPLANT
DURAPREP 26ML APPLICATOR (WOUND CARE) ×3 IMPLANT
ELECT REM PT RETURN 9FT ADLT (ELECTROSURGICAL) ×2
ELECTRODE REM PT RTRN 9FT ADLT (ELECTROSURGICAL) ×1 IMPLANT
GAUZE XEROFORM 1X8 LF (GAUZE/BANDAGES/DRESSINGS) ×2 IMPLANT
GLOVE BIO SURGEON STRL SZ8 (GLOVE) ×2 IMPLANT
GLOVE BIOGEL PI IND STRL 8 (GLOVE) ×1 IMPLANT
GLOVE BIOGEL PI INDICATOR 8 (GLOVE) ×1
GLOVE ORTHO TXT STRL SZ7.5 (GLOVE) ×3 IMPLANT
GLOVE SURG SS PI 6.0 STRL IVOR (GLOVE) ×2 IMPLANT
GOWN PREVENTION PLUS LG XLONG (DISPOSABLE) IMPLANT
GOWN PREVENTION PLUS XLARGE (GOWN DISPOSABLE) ×4 IMPLANT
GOWN STRL NON-REIN LRG LVL3 (GOWN DISPOSABLE) ×4 IMPLANT
KIT BASIN OR (CUSTOM PROCEDURE TRAY) ×2 IMPLANT
KIT ROOM TURNOVER OR (KITS) ×2 IMPLANT
KIT SHOULDER TRACTION (DRAPES) ×2 IMPLANT
MANIFOLD NEPTUNE II (INSTRUMENTS) ×2 IMPLANT
NDL 1/2 CIR CATGUT .05X1.09 (NEEDLE) IMPLANT
NDL HYPO 25GX1X1/2 BEV (NEEDLE) IMPLANT
NDL SCORPION (NEEDLE) IMPLANT
NDL SPNL 18GX3.5 QUINCKE PK (NEEDLE) ×1 IMPLANT
NEEDLE 1/2 CIR CATGUT .05X1.09 (NEEDLE) ×2 IMPLANT
NEEDLE HYPO 25GX1X1/2 BEV (NEEDLE) ×2 IMPLANT
NEEDLE SCORPION (NEEDLE) ×4 IMPLANT
NEEDLE SCORPION MULTI FIRE (NEEDLE) ×2 IMPLANT
NEEDLE SPNL 18GX3.5 QUINCKE PK (NEEDLE) ×2 IMPLANT
NS IRRIG 1000ML POUR BTL (IV SOLUTION) ×2 IMPLANT
PACK SHOULDER (CUSTOM PROCEDURE TRAY) ×2 IMPLANT
PAD ARMBOARD 7.5X6 YLW CONV (MISCELLANEOUS) ×4 IMPLANT
SET ARTHROSCOPY TUBING (MISCELLANEOUS) ×2
SET ARTHROSCOPY TUBING LN (MISCELLANEOUS) ×1 IMPLANT
SPONGE GAUZE 4X4 12PLY (GAUZE/BANDAGES/DRESSINGS) ×2 IMPLANT
SPONGE LAP 4X18 X RAY DECT (DISPOSABLE) ×2 IMPLANT
STAPLER VISISTAT 35W (STAPLE) IMPLANT
STRIP CLOSURE SKIN 1/2X4 (GAUZE/BANDAGES/DRESSINGS) IMPLANT
SUT ETHILON 3 0 PS 1 (SUTURE) ×1 IMPLANT
SYR CONTROL 10ML LL (SYRINGE) ×1 IMPLANT
TAPE CLOTH SURG 6X10 WHT LF (GAUZE/BANDAGES/DRESSINGS) ×1 IMPLANT
TOWEL OR 17X24 6PK STRL BLUE (TOWEL DISPOSABLE) ×2 IMPLANT
TOWEL OR 17X26 10 PK STRL BLUE (TOWEL DISPOSABLE) ×2 IMPLANT
WAND 90 DEG TURBOVAC W/CORD (SURGICAL WAND) ×2 IMPLANT
WATER STERILE IRR 1000ML POUR (IV SOLUTION) ×2 IMPLANT

## 2013-11-03 NOTE — Transfer of Care (Signed)
Immediate Anesthesia Transfer of Care Note  Patient: Kaylee Adams  Procedure(s) Performed: Procedure(s): RIGHT SHOULDER ARTHROSCOPY WITH DEBRIDEMENT, ROTATOR CUFF REPAIR AND SUBACROMIAL DECOMPRESSION, DISTAL CLAVICLE RESECTION (Right)  Patient Location: PACU  Anesthesia Type:General  Level of Consciousness: awake, alert , oriented and patient cooperative  Airway & Oxygen Therapy: Patient Spontanous Breathing and Patient connected to nasal cannula oxygen  Post-op Assessment: Report given to PACU RN and Post -op Vital signs reviewed and stable  Post vital signs: Reviewed and stable  Complications: No apparent anesthesia complications

## 2013-11-03 NOTE — Brief Op Note (Signed)
11/03/2013  4:59 PM  PATIENT:  Truddie Hidden  62 y.o. female  PRE-OPERATIVE DIAGNOSIS:  Right shoulder rotator cuff tear  POST-OPERATIVE DIAGNOSIS:  Right shoulder rotator cuff tear  PROCEDURE:  Procedure(s): RIGHT SHOULDER ARTHROSCOPY WITH DEBRIDEMENT, ROTATOR CUFF REPAIR AND SUBACROMIAL DECOMPRESSION, DISTAL CLAVICLE RESECTION (Right)  SURGEON:  Surgeon(s) and Role:    * Kathryne Hitch, MD - Primary  PHYSICIAN ASSISTANT: Rexene Edison, PA-C  ANESTHESIA:   regional and general  EBL:  Total I/O In: 1050 [I.V.:1050] Out: -   BLOOD ADMINISTERED:none  DRAINS: none   LOCAL MEDICATIONS USED:  NONE  SPECIMEN:  No Specimen  COUNTS:  YES  TOURNIQUET:  * No tourniquets in log *  DICTATION: .Other Dictation: Dictation Number [id number]  PLAN OF CARE: Discharge to home after PACU  PATIENT DISPOSITION:  PACU - hemodynamically stable.   Delay start of Pharmacological VTE agent (>24hrs) due to surgical blood loss or risk of bleeding: not applicable

## 2013-11-03 NOTE — Anesthesia Preprocedure Evaluation (Signed)
Anesthesia Evaluation  Patient identified by MRN, date of birth, ID band Patient awake    Reviewed: Allergy & Precautions, H&P , NPO status , Patient's Chart, lab work & pertinent test results  History of Anesthesia Complications Negative for: history of anesthetic complications  Airway Mallampati: II TM Distance: >3 FB Neck ROM: Full    Dental  (+) Dental Advisory Given   Pulmonary neg pulmonary ROS,  breath sounds clear to auscultation  Pulmonary exam normal       Cardiovascular hypertension, Pt. on medications - angina+ CAD ('06 cath: non-obstructive 30% LAD lesion) Rhythm:Regular Rate:Normal  '13 Myoview: non-ischemic, EF 72%   Neuro/Psych    GI/Hepatic negative GI ROS, Neg liver ROS,   Endo/Other  negative endocrine ROS  Renal/GU negative Renal ROS     Musculoskeletal   Abdominal (+) + obese,   Peds  Hematology   Anesthesia Other Findings   Reproductive/Obstetrics                           Anesthesia Physical Anesthesia Plan  ASA: III  Anesthesia Plan: General   Post-op Pain Management:    Induction: Intravenous  Airway Management Planned: Oral ETT  Additional Equipment:   Intra-op Plan:   Post-operative Plan: Extubation in OR  Informed Consent: I have reviewed the patients History and Physical, chart, labs and discussed the procedure including the risks, benefits and alternatives for the proposed anesthesia with the patient or authorized representative who has indicated his/her understanding and acceptance.   Dental advisory given  Plan Discussed with: CRNA and Surgeon  Anesthesia Plan Comments: (Plan routine monitors, GETA with interscalene block for post op analgesia)        Anesthesia Quick Evaluation

## 2013-11-03 NOTE — Preoperative (Signed)
Beta Blockers   Reason not to administer Beta Blockers:Not Applicable 

## 2013-11-03 NOTE — Progress Notes (Signed)
Orthopedic Tech Progress Note Patient Details:  Kaylee Adams 02-15-1951 [id number]  Ortho Devices Ortho Device/Splint Location: Kaylee Adams  shoulder abduction pillow   [name] 11/03/2013, 5:32 PM

## 2013-11-03 NOTE — H&P (Signed)
Kaylee Adams is an 62 y.o. female.   Chief Complaint:   Right shoulder pain and weakness HPI:   62 yo female with right shoulder pain and weakness.  Failed conservative treatment with time, rest, NSAIDS, steroid injections, and exercises.  A recent MRI of the right shoulder confirmed a full-thickness rotator cuff tear and significant impingement.  She wishes now to proceed with surgery given her continued pain and weakness.  She understands fully the risks and benefits of surgery and does wish to proceed.  Past Medical History  Diagnosis Date  . HLD (hyperlipidemia)   . HTN (hypertension)   . CAD (coronary artery disease)     a. 11/2005 Cath: nonobs dzs, 30% LAD lesion on cath ;  b. 11/2005 Echo: NL EF;  c. 04/2012 Echo: EF 55-60%, Gr 2 DD;  d. 04/2012 Ex MV: EF 72%, ST depression in recovery with NL perfusion imaging - HTN response to exercise.  . Hepatic cyst     on ct [place]  . Midsternal chest pain     cardiology eval with non-ischemic stress test 04/2012    Past Surgical History  Procedure Laterality Date  . Vesicovaginal fistula closure w/ tah      for fibroid tumors  . Breast biopsy    . Cholecystectomy    . Child birth x 5    . Abdominal hysterectomy      fibroid tumors    Family History  Problem Relation Age of Onset  . Breast cancer Sister   . Hypertension Mother    Social History:  reports that she has never smoked. She has never used smokeless tobacco. She reports that she does not drink alcohol or use illicit drugs.  Allergies:  Allergies  Allergen Reactions  . Lisinopril     REACTION: cough  . Penicillins Nausea Only    Medications Prior to Admission  Medication Sig Dispense Refill  . amLODipine (NORVASC) 5 MG tablet Take 1 tablet (5 mg total) by mouth daily.  30 tablet  11  . losartan-hydrochlorothiazide (HYZAAR) 100-25 MG per tablet Take 1 tablet by mouth daily.      . meloxicam (MOBIC) 7.5 MG tablet Take 7.5 mg by mouth 2 (two) times daily.      . traMADol  (ULTRAM) 50 MG tablet Take 50 mg by mouth 2 (two) times daily as needed.        No results found for this or any previous visit (from the past 48 hour(s)). No results found.  Review of Systems  All other systems reviewed and are negative.    Blood pressure 126/53, pulse 82, temperature 97.7 F (36.5 C), temperature source Oral, resp. rate 18, SpO2 100.00%. Physical Exam  Constitutional: She is oriented to person, place, and time. She appears well-developed and well-nourished.  HENT:  Head: Normocephalic and atraumatic.  Eyes: EOM are normal. Pupils are equal, round, and reactive to light.  Neck: Normal range of motion. Neck supple.  Cardiovascular: Normal rate and regular rhythm.   Respiratory: Effort normal and breath sounds normal.  GI: Soft. Bowel sounds are normal.  Musculoskeletal:       Right shoulder: She exhibits decreased range of motion, tenderness and decreased strength.  Neurological: She is alert and oriented to person, place, and time.  Skin: Skin is warm and dry.  Psychiatric: She has a normal mood and affect.     Assessment/Plan Right shoulder impingement syndrome with a full-thickness rotator cuff tear. 1)  To the OR today  for a right shoulder arthroscopy with subacromial decompression and attempted rotator cuff repair.  BLACKMAN,CHRISTOPHER Y 11/03/2013, 12:16 PM

## 2013-11-03 NOTE — Anesthesia Procedure Notes (Addendum)
Anesthesia Regional Block:  Interscalene brachial plexus block  Pre-Anesthetic Checklist: ,, timeout performed, Correct Patient, Correct Site, Correct Laterality, Correct Procedure, Correct Position, site marked, Risks and benefits discussed,  Surgical consent,  Pre-op evaluation,  At surgeon's request and post-op pain management  Laterality: Right  Prep: chloraprep       Needles:  Injection technique: Single-shot  Needle Type: Stimulator Needle - 40      Needle Gauge: 22 and 22 G    Additional Needles:  Procedures: nerve stimulator Interscalene brachial plexus block  Nerve Stimulator or Paresthesia:  Response: forearm twitch, 0.45 mA, 0.1 ms,   Additional Responses:   Narrative:  Start time: 11/03/2013 2:55 PM End time: 11/03/2013 3:01 PM Injection made incrementally with aspirations every 5 mL.  Performed by: Personally  Anesthesiologist: Sandford Craze, MD  Additional Notes: Pt identified in Holding room.  Monitors applied. Working IV access confirmed. Sterile prep R neck.  #22ga PNS to forearm twitch at 0.1mA threshold.  30cc 0.5% Bupivacaine with 1:200k epi injected incrementally after negative test dose.  Patient asymptomatic, VSS, no heme aspirated, tolerated well.  Sandford Craze, MD  Interscalene brachial plexus block Procedure Name: Intubation Date/Time: 11/03/2013 3:18 PM Performed by: Darcey Nora B Pre-anesthesia Checklist: Patient identified, Emergency Drugs available, Suction available and Patient being monitored Patient Re-evaluated:Patient Re-evaluated prior to inductionOxygen Delivery Method: Circle system utilized Preoxygenation: Pre-oxygenation with 100% oxygen Intubation Type: IV induction Ventilation: Mask ventilation without difficulty and Oral airway inserted - appropriate to patient size Laryngoscope Size: Mac and 3 Grade View: Grade II Tube type: Oral Tube size: 7.5 mm Number of attempts: 1 Airway Equipment and Method: Stylet Placement  Confirmation: ETT inserted through vocal cords under direct vision,  positive ETCO2 and breath sounds checked- equal and bilateral Secured at: 21 (cm at teeth) cm Tube secured with: Tape Dental Injury: Teeth and Oropharynx as per pre-operative assessment

## 2013-11-04 ENCOUNTER — Encounter (HOSPITAL_COMMUNITY): Payer: Self-pay | Admitting: Orthopaedic Surgery

## 2013-11-06 NOTE — Op Note (Signed)
NAME:  Kaylee Adams, Kaylee Adams NO.:  [id number]  MEDICAL RECORD NO.:  [id number]  LOCATION:                                 FACILITY:  PHYSICIAN:  Vanita Panda. Magnus Ivan, M.D.DATE OF BIRTH:  1951-07-05  DATE OF PROCEDURE:  11/03/2013 DATE OF DISCHARGE:  11/03/2013                              OPERATIVE REPORT   PREOPERATIVE DIAGNOSIS:  Right shoulder impingement syndrome and full- thickness rotator cuff tear.  POSTOPERATIVE DIAGNOSIS:  Right shoulder impingement syndrome and full- thickness rotator cuff tear.  PROCEDURE:  Right shoulder arthroscopy with extensive debridement, subacromial decompression, and arthroscopically assisted rotator cuff repair.  SURGEON:  Vanita Panda. Magnus Ivan, MD  ASSISTANT:  Richardean Canal, PA-C.  ANESTHESIA: 1. Right shoulder interscalene block. 2. General.  BLOOD LOSS:  Minimal.  COMPLICATIONS:  None.  INDICATIONS:  Kaylee Adams is a very pleasant 62 year old female with debilitating pain involving her right shoulder for several months now. She had failed conservative treatment.  We did 2 injections, exercises, anti-inflammatories, rest, ice, heat, and time.  We did obtain an MRI of her shoulder that showed significant impingement syndrome with type 2 acromion full-thickness rotator cuff tear and inflamed tissue throughout the shoulder.  Given these findings, it was recommended she undergo an arthroscopic intervention of her shoulder including extensive debridement, subacromial decompression, and arthroscopically-assisted rotator cuff repair.  She understands the risks and benefits of this fully and did wish to proceed with surgery.  PROCEDURE DESCRIPTION:  After informed consent was obtained, appropriate right shoulder was marked.  An interscalene block was obtained.  She was then brought to the operating room, placed supine on the operating table.  General anesthesia was then obtained.  She was then fashioned in a beach-chair  position with appropriate positioning of her head and neck, padding at the down on operative left arm.  There was bending at the waist and knees and palpable pulses in her feet.  There was neutral positioning of her head and neck as well.  Her right operative shoulder and arm were then prepped and draped with DuraPrep and sterile drapes. She was then placed in an in-line skeletal traction using the fishing pole traction device, 45 degrees of forward flexion, neutral rotation. Time-out was called to identify correct patient, correct right shoulder. I then made posterolateral arthroscopy portal and entered the glenohumeral joint to the posterior aspect of the shoulder.  Right away, we could see that there was a full-thickness rotator cuff tear.  I made an anterior portal and the rotator interval and used a soft tissue ablation wand to carry out extensive debridement of the undersurface of the cuff and inflamed tissue throughout the glenohumeral joint.  The subscapularis and biceps tendon were intact as well as the labrum.  I then entered the subacromial space of the posterior portal and made two separate lateral portals for suture management.  I was able to mobilize the rotator cuff and cleaned the footprint with a high-speed bur to get a good bleeding surface.  I then mobilized the rotator cuff again and pulled it over to resting position.  I cleaned the subacromial space of the bursa and thickened tissue on the  rotator cuff, and used a high- speed bur to perform a partial acromioplasty.  I then placed a __________ 4.5, suture anchor in the footprint of the rotator cuff and using a scorpion suture passer from [employer], I passed two FiberWire sutures through the front of the cuff in a horizontal mattress form and 2 in the back of the cuff.  I then tied these over sliding knots to bring the cuff back down to resting position, and then with all 4 limbs of suture put those to a push lock in the  subdeltoid area as a double- row technique.  I was pleased with how tight I brought the cuff down and removed __________ internal and external rotation and moved as a unit with the cuff down.  I then removed all instrumentation from the shoulder, closed the portal sites with interrupted nylon suture. Xeroform and well-padded sterile dressing was applied __________ shoulder was placed in a shoulder abduction sling.  She was awakened and extubated taken to recovery room in stable condition.  All final counts were correct and no complications noted.     Vanita Panda. Magnus Ivan, M.D.     CYB/MEDQ  D:  11/03/2013  T:  11/04/2013  Job:  [id number]

## 2013-11-06 NOTE — Anesthesia Postprocedure Evaluation (Signed)
  Anesthesia Post-op Note  Patient: Truddie Hidden  Procedure(s) Performed: Procedure(s): RIGHT SHOULDER ARTHROSCOPY WITH DEBRIDEMENT, ROTATOR CUFF REPAIR AND SUBACROMIAL DECOMPRESSION, DISTAL CLAVICLE RESECTION (Right)  Patient Location: PACU  Anesthesia Type:GA combined with regional for post-op pain  Level of Consciousness: awake, alert  and oriented  Airway and Oxygen Therapy: Patient Spontanous Breathing  Post-op Pain: none  Post-op Assessment: Post-op Vital signs reviewed  Post-op Vital Signs: Reviewed  Complications: No apparent anesthesia complications

## 2013-11-13 ENCOUNTER — Telehealth: Payer: Self-pay | Admitting: Family Medicine

## 2013-11-13 ENCOUNTER — Telehealth: Payer: Self-pay | Admitting: Internal Medicine

## 2013-11-13 ENCOUNTER — Encounter: Payer: Self-pay | Admitting: Internal Medicine

## 2013-11-13 ENCOUNTER — Emergency Department (HOSPITAL_COMMUNITY): Payer: 59

## 2013-11-13 ENCOUNTER — Ambulatory Visit (INDEPENDENT_AMBULATORY_CARE_PROVIDER_SITE_OTHER)
Admission: RE | Admit: 2013-11-13 | Discharge: 2013-11-13 | Disposition: A | Discharge status: Home / Self Care | Payer: 59 | Source: Ambulatory Visit | Attending: Internal Medicine | Admitting: Internal Medicine

## 2013-11-13 ENCOUNTER — Encounter (HOSPITAL_COMMUNITY): Payer: Self-pay | Admitting: Emergency Medicine

## 2013-11-13 ENCOUNTER — Ambulatory Visit (INDEPENDENT_AMBULATORY_CARE_PROVIDER_SITE_OTHER): Payer: 59 | Admitting: Internal Medicine

## 2013-11-13 ENCOUNTER — Emergency Department (HOSPITAL_COMMUNITY)
Admission: EM | Admit: 2013-11-13 | Discharge: 2013-11-13 | Disposition: A | Discharge status: Home / Self Care | Payer: 59 | Attending: Emergency Medicine | Admitting: Emergency Medicine

## 2013-11-13 VITALS — BP 132/72 | HR 83 | Temp 97.8°F | Wt 172.0 lb

## 2013-11-13 DIAGNOSIS — Z9889 Other specified postprocedural states: Secondary | ICD-10-CM

## 2013-11-13 DIAGNOSIS — R0602 Shortness of breath: Secondary | ICD-10-CM

## 2013-11-13 DIAGNOSIS — I10 Essential (primary) hypertension: Secondary | ICD-10-CM

## 2013-11-13 DIAGNOSIS — Z79899 Other long term (current) drug therapy: Secondary | ICD-10-CM | POA: Insufficient documentation

## 2013-11-13 DIAGNOSIS — R0789 Other chest pain: Secondary | ICD-10-CM | POA: Insufficient documentation

## 2013-11-13 DIAGNOSIS — Z8719 Personal history of other diseases of the digestive system: Secondary | ICD-10-CM | POA: Insufficient documentation

## 2013-11-13 DIAGNOSIS — Z880 Allergy status to penicillin: Secondary | ICD-10-CM | POA: Insufficient documentation

## 2013-11-13 DIAGNOSIS — Z862 Personal history of diseases of the blood and blood-forming organs and certain disorders involving the immune mechanism: Secondary | ICD-10-CM | POA: Insufficient documentation

## 2013-11-13 DIAGNOSIS — R7989 Other specified abnormal findings of blood chemistry: Secondary | ICD-10-CM | POA: Insufficient documentation

## 2013-11-13 DIAGNOSIS — Z8639 Personal history of other endocrine, nutritional and metabolic disease: Secondary | ICD-10-CM | POA: Insufficient documentation

## 2013-11-13 DIAGNOSIS — I2510 Atherosclerotic heart disease of native coronary artery without angina pectoris: Secondary | ICD-10-CM | POA: Insufficient documentation

## 2013-11-13 LAB — BASIC METABOLIC PANEL
BUN: 21 mg/dL (ref 6–23)
CO2: 28 mEq/L (ref 19–32)
Chloride: 105 mEq/L (ref 96–112)
Creatinine, Ser: 0.8 mg/dL (ref 0.4–1.2)
Glucose, Bld: 80 mg/dL (ref 70–99)
Potassium: 3 mEq/L — ABNORMAL LOW (ref 3.5–5.1)
Sodium: 139 mEq/L (ref 135–145)

## 2013-11-13 LAB — CBC WITH DIFFERENTIAL/PLATELET
Basophils Absolute: 0 10*3/uL (ref 0.0–0.1)
Basophils Relative: 0.3 % (ref 0.0–3.0)
Eosinophils Absolute: 0 10*3/uL (ref 0.0–0.7)
Lymphocytes Relative: 32.1 % (ref 12.0–46.0)
MCHC: 33.5 g/dL (ref 30.0–36.0)
MCV: 78.3 fl (ref 78.0–100.0)
Monocytes Relative: 7.7 % (ref 3.0–12.0)
Neutrophils Relative %: 59.9 % (ref 43.0–77.0)
Platelets: 416 10*3/uL — ABNORMAL HIGH (ref 150.0–400.0)
RDW: 13.5 % (ref 11.5–14.6)

## 2013-11-13 LAB — TROPONIN I: Troponin I: <0.3 ng/mL (ref <0.30)

## 2013-11-13 MED ORDER — SODIUM CHLORIDE 0.9 % IV BOLUS (SEPSIS)
1000.0000 mL | Freq: Once | INTRAVENOUS | Status: AC
  Administered 2013-11-13: 1000 mL via INTRAVENOUS

## 2013-11-13 NOTE — Telephone Encounter (Signed)
Revonda Standard from the lab called with a critical level on the patient's D-Dimer.  It came back at 1.34.  Dr. Fabian Sharp notified.  Instructions are for the pt to go immediately to the emergency room.  She was notified by telephone.  Pt stated she will go to [facility].  Called the triage nurse, Romeo Apple and notified him that the pt is on the way.

## 2013-11-13 NOTE — ED Notes (Signed)
Pt used bedside toilet.  

## 2013-11-13 NOTE — ED Notes (Signed)
PT comfortable with d/c and f/u instructions. No prescriptions. 

## 2013-11-13 NOTE — ED Notes (Signed)
Pt reports having surgery on 11-03-13 and has had SHOB since the surgery . Pt went to PCP Berniece Andreas for labs. Pt was called to come to ED for elevated D-dimer.

## 2013-11-13 NOTE — ED Provider Notes (Signed)
Medical screening examination/treatment/procedure(s) were performed by non-physician practitioner and as supervising physician I was immediately available for consultation/collaboration.  EKG Interpretation    Date/Time:  Friday November 13 2013 18:59:16 EST Ventricular Rate:  77 PR Interval:  155 QRS Duration: 75 QT Interval:  391 QTC Calculation: 442 R Axis:   72 Text Interpretation:  Sinus rhythm Prominent P waves, nondiagnostic Consider left ventricular hypertrophy No significant change since last tracing Confirmed by SHELDON  MD, CHARLES [phone]) on 11/13/2013 7:19:18 PM              Charles B. Bernette Mayers, MD 11/13/13 2158

## 2013-11-13 NOTE — Telephone Encounter (Signed)
Patient Information:  Caller Name: Jenika  Phone: [phone]  Patient: Kaylee, Adams  Gender: Female  DOB: 03/03/1951  Age: 62 Years  PCP: Berniece Andreas [facility])  Office Follow Up:  Does the office need to follow up with this patient?: No  Instructions For The Office: N/A   Symptoms  Reason For Call & Symptoms: Started having some breathing problems.  Had surgery 11/03/13 had right rotator cuff surgery.   It's like "i'm not getting enough air".  She has been having "soreness" in her thigh before, but more so after surgery.  Pt is not gasping or panting for air, she just feels SOB.  Instructed she needs to be seen in the ED and pt is crying that she does not want to go and may she please have an office appt.   Reviewed Health History In EMR: Yes  Reviewed Medications In EMR: Yes  Reviewed Allergies In EMR: Yes  Reviewed Surgeries / Procedures: Yes  Date of Onset of Symptoms: 11/03/2013  Guideline(s) Used:  Breathing Difficulty  Disposition Per Guideline:   Go to ED Now  Reason For Disposition Reached:   Major surgery in the past month  Advice Given:  N/A  Patient Refused Recommendation:  Patient Refused Care Advice  She does not want to go to the ED.  Called office for an appt. Appt made for today, 11/13/13 at 1415 and pt lives in [place].  It will take her this long to get to the office.

## 2013-11-13 NOTE — ED Notes (Signed)
Patient transported to CT 

## 2013-11-13 NOTE — Patient Instructions (Addendum)
Uncertain cause of your sx . Exam is good . Oxygen level is good. Labs today  And chest x ray. If  D diimer is elevated proceed to the ed to make sure you dont have a blood clot  In  yhour lungs.   Shortness of Breath Shortness of breath means you have trouble breathing. Shortness of breath may indicate that you have a medical problem. You should seek immediate medical care for shortness of breath. CAUSES   Not enough oxygen in the air (as with high altitudes or a smoke-filled room).  Short-term (acute) lung disease, including:  Infections, such as pneumonia.  Fluid in the lungs, such as heart failure.  A blood clot in the lungs (pulmonary embolism).  Long-term (chronic) lung diseases.  Heart disease (heart attack, angina, heart failure, and others).  Low red blood cells (anemia).  Poor physical fitness. This can cause shortness of breath when you exercise.  Chest or back injuries or stiffness.  Being overweight.  Smoking.  Anxiety. This can make you feel like you are not getting enough air. DIAGNOSIS  Serious medical problems can usually be found during your physical exam. Tests may also be done to determine why you are having shortness of breath. Tests may include:  Chest X-rays.  Lung function tests.  Blood tests.  Electrocardiography.  Exercise testing.  Echocardiography.  Imaging scans. Your caregiver may not be able to find a cause for your shortness of breath after your exam. In this case, it is important to have a follow-up exam with your caregiver as directed.  TREATMENT  Treatment for shortness of breath depends on the cause of your symptoms and can vary greatly. HOME CARE INSTRUCTIONS   Do not smoke. Smoking is a common cause of shortness of breath. If you smoke, ask for help to quit.  Avoid being around chemicals or things that may bother your breathing, such as paint fumes and dust.  Rest as needed. Slowly resume your usual activities.  If  medicines were prescribed, take them as directed for the full length of time directed. This includes oxygen and any inhaled medicines.  Keep all follow-up appointments as directed by your caregiver. SEEK MEDICAL CARE IF:   Your condition does not improve in the time expected.  You have a hard time doing your normal activities even with rest.  You have any side effects or problems with the medicines prescribed.  You develop any new symptoms. SEEK IMMEDIATE MEDICAL CARE IF:   Your shortness of breath gets worse.  You feel lightheaded, faint, or develop a cough not controlled with medicines.  You start coughing up blood.  You have pain with breathing.  You have chest pain or pain in your arms, shoulders, or abdomen.  You have a fever.  You are unable to walk up stairs or exercise the way you normally do. MAKE SURE YOU:  Understand these instructions.  Will watch your condition.  Will get help right away if you are not doing well or get worse. Document Released: 09/04/2001 Document Revised: 06/10/2012 Document Reviewed: 02/25/2012 [facility] Patient Information 2014 [place], [place].

## 2013-11-13 NOTE — ED Provider Notes (Signed)
CSN: [id number]     Arrival date & time 11/13/13  1803 History   First MD Initiated Contact with Patient 11/13/13 1846     Chief Complaint  Patient presents with  . Abnormal Lab    D-Dimer 1.34 at MD office    (Consider location/radiation/quality/duration/timing/severity/associated sxs/prior Treatment) The history is provided by the patient and medical records. No language interpreter was used.   Kaylee Adams is a 62 y.o. female  with a hx of HTN, HLD, CAD presents to the Emergency Department complaining of gradual, persistent, progressively worseningSOB onset 10 days ago. Associated symptoms include chest discomfort.  Patient reports she had right rotator cuff surgery on 11/03/2013.  She reports being somewhat short of breath since surgery increasing the last few days. Patient reports for shortness of breath is worse with exertion and better with rest but never completely resolves. Pt denies fever, chills, headache, neck pain, abdominal pain, nausea, vomiting, diarrhea, weakness, dizziness, syncope, dysuria, hematuria.     Past Medical History  Diagnosis Date  . HLD (hyperlipidemia)   . HTN (hypertension)   . CAD (coronary artery disease)     a. 11/2005 Cath: nonobs dzs, 30% LAD lesion on cath ;  b. 11/2005 Echo: NL EF;  c. 04/2012 Echo: EF 55-60%, Gr 2 DD;  d. 04/2012 Ex MV: EF 72%, ST depression in recovery with NL perfusion imaging - HTN response to exercise.  . Hepatic cyst     on ct [place]  . Midsternal chest pain     cardiology eval with non-ischemic stress test 04/2012   Past Surgical History  Procedure Laterality Date  . Vesicovaginal fistula closure w/ tah      for fibroid tumors  . Breast biopsy    . Cholecystectomy    . Child birth x 5    . Abdominal hysterectomy      fibroid tumors  . Shoulder arthroscopy with rotator cuff repair and subacromial decompression Right 11/03/2013    Procedure: RIGHT SHOULDER ARTHROSCOPY WITH DEBRIDEMENT, ROTATOR CUFF REPAIR AND SUBACROMIAL  DECOMPRESSION, DISTAL CLAVICLE RESECTION;  Surgeon: Kathryne Hitch, MD;  Location: [facility] OR;  Service: Orthopedics;  Laterality: Right;   Family History  Problem Relation Age of Onset  . Breast cancer Sister   . Hypertension Mother    History  Substance Use Topics  . Smoking status: Never Smoker   . Smokeless tobacco: Never Used  . Alcohol Use: No   OB History   Grav Para Term Preterm Abortions TAB SAB Ect Mult Living                 Review of Systems  Constitutional: Negative for fever, diaphoresis, appetite change, fatigue and unexpected weight change.  HENT: Negative for mouth sores.   Eyes: Negative for visual disturbance.  Respiratory: Positive for chest tightness and shortness of breath. Negative for cough and wheezing.   Cardiovascular: Negative for chest pain.  Gastrointestinal: Negative for nausea, vomiting, abdominal pain, diarrhea and constipation.  Endocrine: Negative for polydipsia, polyphagia and polyuria.  Genitourinary: Negative for dysuria, urgency, frequency and hematuria.  Musculoskeletal: Negative for back pain and neck stiffness.  Skin: Negative for rash.  Allergic/Immunologic: Negative for immunocompromised state.  Neurological: Negative for syncope, light-headedness and headaches.  Hematological: Does not bruise/bleed easily.  Psychiatric/Behavioral: Negative for sleep disturbance. The patient is not nervous/anxious.     Allergies  Lisinopril and Penicillins  Home Medications   Current Outpatient Rx  Name  Route  Sig  Dispense  Refill  . amLODipine (NORVASC) 5 MG tablet   Oral   Take 1 tablet (5 mg total) by mouth daily.   30 tablet   11   . losartan-hydrochlorothiazide (HYZAAR) 100-25 MG per tablet   Oral   Take 1 tablet by mouth daily.          BP 127/62  Pulse 87  Temp(Src) 98.1 F (36.7 C) (Oral)  Resp 20  Ht 5\' 1"  (1.549 m)  Wt 171 lb (77.565 kg)  BMI 32.33 kg/m2  SpO2 100% Physical Exam  Nursing note and vitals  reviewed. Constitutional: She is oriented to person, place, and time. She appears well-developed and well-nourished. No distress.  Awake, alert, nontoxic appearance  HENT:  Head: Normocephalic and atraumatic.  Mouth/Throat: Oropharynx is clear and moist. No oropharyngeal exudate.  Eyes: Conjunctivae are normal. No scleral icterus.  Neck: Normal range of motion. Neck supple.  Cardiovascular: Normal rate, regular rhythm, normal heart sounds and intact distal pulses.   No murmur heard. Regular rate and rhythm  Pulmonary/Chest: Effort normal and breath sounds normal. No respiratory distress. She has no wheezes.  Clear and equal breath sounds  Abdominal: Soft. Bowel sounds are normal. She exhibits no mass. There is no tenderness. There is no rebound and no guarding.  Abdomen soft and nontender  Musculoskeletal: Normal range of motion. She exhibits no edema.  Decreased range of motion of the right shoulder secondary to pain  Lymphadenopathy:    She has no cervical adenopathy.  Neurological: She is alert and oriented to person, place, and time. She exhibits normal muscle tone. Coordination normal.  Speech is clear and goal oriented Moves extremities without ataxia  Skin: Skin is warm and dry. She is not diaphoretic. No erythema.  Well-healing incision of the right shoulder  Psychiatric: She has a normal mood and affect. Her behavior is normal.    ED Course  Procedures (including critical care time) Labs Review Labs Reviewed  TROPONIN I   Imaging Review Dg Chest 2 View  11/13/2013   CLINICAL DATA:  62 year old female with shortness of breath. Recent right shoulder surgery. Initial encounter.  EXAM: CHEST  2 VIEW  COMPARISON:  02/13/2013.  FINDINGS: Lung volumes are stable and within normal limits. Low lung volumes on the lateral view on which the patient could not raise her arm. Stable minimal curvilinear scarring or atelectasis at the lung bases. No pneumothorax, pulmonary edema, pleural  effusion or acute pulmonary opacity. Normal cardiac size and mediastinal contours. Visualized tracheal air column is within normal limits. No acute osseous abnormality identified. Right upper quadrant surgical clips.  IMPRESSION: No acute cardiopulmonary abnormality.   Electronically Signed   By: Augusto Gamble M.D.   On: 11/13/2013 15:56   Ct Angio Chest Pe W/cm &/or Wo Cm  11/13/2013   CLINICAL DATA:  Shortness of breath. Elevated D-dimer. Recent rotator cuff surgery.  EXAM: CT ANGIOGRAPHY CHEST WITH CONTRAST  TECHNIQUE: Multidetector CT imaging of the chest was performed using the standard protocol during bolus administration of intravenous contrast. Multiplanar CT image reconstructions including MIPs were obtained to evaluate the vascular anatomy.  CONTRAST:  100 mL of Omnipaque 350.  COMPARISON:  Chest CT 07/24/2006.  FINDINGS: Mediastinum: There are no filling defects within the pulmonary arterial tree to suggest underlying pulmonary embolism. Heart size is mildly enlarged. There is no significant pericardial fluid, thickening or pericardial calcification. There is atherosclerosis of the thoracic aorta, the great vessels of the mediastinum and the coronary arteries, including calcified  atherosclerotic plaque in the left main coronary arteries. No pathologically enlarged mediastinal or hilar lymph nodes. Esophagus is unremarkable in appearance.  Lungs/Pleura: Mild scarring in the inferior segment of the lingula. No acute consolidative airspace disease. No pleural effusions. Several tiny pulmonary nodules are scattered throughout the lungs bilaterally (right greater than left), largest of which measures only 4 mm in the anterior aspect of the right middle lobe (image 78 of series 6). No larger more suspicious appearing pulmonary nodules or masses are otherwise identified on today's examination. No pneumothorax.  Upper Abdomen: Multiple well-defined hepatic lesions are noted, the majority of which are low  attenuation and compatible with simple cysts. The largest of these lesions is in segment 7 of the liver (image 120 of series 4), and is intermediate attenuation (20 HU), favored to represent a proteinaceous cyst. These lesions are only slightly larger than prior study from 03/19/2009. Status post cholecystectomy.  Musculoskeletal: There are no aggressive appearing lytic or blastic lesions noted in the visualized portions of the skeleton.  Review of the MIP images confirms the above findings.  IMPRESSION: 1. No evidence pulmonary embolism. 2. No acute findings in the thorax to account for the patient's symptoms. 3. Multiple tiny nonspecific pulmonary nodules, as above. Largest of these measures only 4 mm in the right middle lobe. These nodules appear unchanged in size, number and distribution compared to remote prior CT scan 07/24/2006, and can be considered benign, presumably noncalcified granulomas, areas of mucoid impaction within terminal bronchioles, or other benign processes. 4. Additional incidental findings, as above.   Electronically Signed   By: Trudie Reed M.D.   On: 11/13/2013 20:52    EKG Interpretation    Date/Time:  Friday November 13 2013 18:59:16 EST Ventricular Rate:  77 PR Interval:  155 QRS Duration: 75 QT Interval:  391 QTC Calculation: 442 R Axis:   72 Text Interpretation:  Sinus rhythm Prominent P waves, nondiagnostic Consider left ventricular hypertrophy No significant change since last tracing Confirmed by SHELDON  MD, CHARLES ([id number]) on 11/13/2013 7:19:18 PM            MDM   1. SOB (shortness of breath)   2. Elevated d-dimer      Kymorah K Deiter presents for primary care office with elevated d-dimer (1.34) 10 days after surgery.  Patient without leukocytosis and normal kidney function on labs drawn earlier today by primary care physician. Will obtain EKG, troponin and CT angio chest. Patient alert, nontoxic, nonseptic appearing. No tachycardia, afebrile.  9:49  PM Negative troponin, nonischemic ECG.  CT angiogram evidence of pulmonary embolism or edema. Small nodules noted throughout the lungs.  Discussed with patient appropriate followup for this.  She is alert, oriented, nontoxic, nonseptic appearing. She has no hypoxia, is afebrile and without tachycardia.  It has been determined that no acute conditions requiring further emergency intervention are present at this time. The patient/guardian have been advised of the diagnosis and plan. We have discussed signs and symptoms that warrant return to the ED, such as changes or worsening in symptoms.   Vital signs are stable at discharge.   BP 127/62  Pulse 87  Temp(Src) 98.1 F (36.7 C) (Oral)  Resp 20  Ht 5\' 1"  (1.549 m)  Wt 171 lb (77.565 kg)  BMI 32.33 kg/m2  SpO2 100%  Patient/guardian has voiced understanding and agreed to follow-up with the PCP or specialist.       Dierdre Forth, PA-C 11/13/13 2150

## 2013-11-13 NOTE — ED Notes (Signed)
Pt reports she was having a difficult time breathing, sts she really can't describe how it feels. Went to PCP and they ran labs, went today and told to come to ED d/t elevated d.dimer. Pt denies pain/discomfort. Pt just had rotator cuff sx last Tuesday on her right shoulder. Nad, skin warm and dry, resp e/u.

## 2013-11-13 NOTE — ED Notes (Signed)
CT notified pt ready for CT.

## 2013-11-13 NOTE — Progress Notes (Signed)
Chief Complaint  Patient presents with  . Shortness of Breath    Ongoing for several weeks.  Knows that it has worsened after surgery.  Today happened when she used a different scented lotion.    HPI: Patient comes in today for SDA for  new problem evaluation. Came in after refusing to go to ed per CAN for sob see CAN note .  She is sp shoulder surgery from  11 11  And her shoulder is doing well.  Seems worse with  scents    Based on hx  Off an on since   Notices since surgery.    Uncertain when stareted "Like cant get enough air)   No coughing  No fever. No wheezing  Didn't wake her up at night. Worse this am and called service . But lingering  Since then  Was on oxycodone and muscle relaxant  Shoulder  is healing no meds now   After surgery right leg didn't work right   Wouldn't support her   And then better. ocass soreness in leg.  But no swelling and no problem now  Neg hx of clotting and suregery went well. No hx of asthma   No chest pain or  real change in exercise tolerance   ROS: See pertinent positives and negatives per HPI. Son driving her   Husband helping at home  Past Medical History  Diagnosis Date  . HLD (hyperlipidemia)   . HTN (hypertension)   . CAD (coronary artery disease)     a. 11/2005 Cath: nonobs dzs, 30% LAD lesion on cath ;  b. 11/2005 Echo: NL EF;  c. 04/2012 Echo: EF 55-60%, Gr 2 DD;  d. 04/2012 Ex MV: EF 72%, ST depression in recovery with NL perfusion imaging - HTN response to exercise.  . Hepatic cyst     on ct [place]  . Midsternal chest pain     cardiology eval with non-ischemic stress test 04/2012    Family History  Problem Relation Age of Onset  . Breast cancer Sister   . Hypertension Mother     History   Social History  . Marital Status: Married    Spouse Name: N/A    Number of Children: 5  . Years of Education: N/A   Occupational History  .     Social History Main Topics  . Smoking status: Never Smoker   . Smokeless tobacco: Never Used    . Alcohol Use: No  . Drug Use: No  . Sexual Activity: None   Other Topics Concern  . 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 amLODipine (NORVASC) 5 MG tablet Take 1 tablet (5 mg total) by mouth daily.  Marland Kitchen losartan-hydrochlorothiazide (HYZAAR) 100-25 MG per tablet Take 1 tablet by mouth daily.  . [DISCONTINUED] meloxicam (MOBIC) 7.5 MG tablet Take 7.5 mg by mouth 2 (two) times daily.  . [DISCONTINUED] methocarbamol (ROBAXIN) 500 MG tablet Take 1 tablet (500 mg total) by mouth every 6 (six) hours as needed for muscle spasms.  . [DISCONTINUED] oxyCODONE-acetaminophen (ROXICET) 5-325 MG per tablet Take 1-2 tablets by mouth every 4 (four) hours as needed for severe pain.  . [DISCONTINUED] traMADol (ULTRAM) 50 MG tablet Take 50 mg by mouth 2 (two) times daily as needed.    EXAM:  BP 132/72  Pulse 83  Temp(Src) 97.8 F (36.6 C) (Oral)  Wt 172 lb (78.019 kg)  SpO2 98%  Body mass index is 32.52 kg/(m^2).  GENERAL: vitals reviewed and listed above, alert, oriented, appears well hydrated and in no acute distress right arm in sling  HEENT: atraumatic, conjunctiva  clear, no obvious abnormalities on inspection of external nose and ears OP : no lesion edema or exudate  Tongue midline speech normal  NECK: no obvious masses on inspection palpation no jvd  LUNGS: clear to auscultation bilaterally, no wheezes, rales or rhonchi, good air movement CV: HRRR, no clubbing cyanosis or  peripheral edema nl cap refill  No g or m  MS: moves all extremities  Except right shoulder in a sling and immobilization without noticeable focal  Abnormality legs no edema focal tenderness or redness  Few vv  PSYCH: pleasant and cooperative,   ASSESSMENT AND PLAN:  Discussed the following assessment and plan:  Shortness of breath - Plan: D-dimer, Quantitative, CBC with Differential,  Basic metabolic panel, DG Chest 2 View  Status post rotator cuff surgery - Plan: D-dimer, Quantitative, CBC with Differential, Basic metabolic panel, DG Chest 2 View  HYPERTENSION - Plan: D-dimer, Quantitative, CBC with Differential, Basic metabolic panel, DG Chest 2 View Nl exam  But timing post op  Needs eval  Unfortunately this is Friday afternoon  Will get d dimer and cxray if ok then plan fu. If positive plan send to ed for evaluation. -Patient advised to return or notify health care team  if symptoms worsen or persist or new concerns arise.  Patient Instructions  Uncertain cause of your sx . Exam is good . Oxygen level is good. Labs today  And chest x ray. If  D diimer is elevated proceed to the ed to make sure you dont have a blood clot  In  yhour lungs.   Shortness of Breath Shortness of breath means you have trouble breathing. Shortness of breath may indicate that you have a medical problem. You should seek immediate medical care for shortness of breath. CAUSES   Not enough oxygen in the air (as with high altitudes or a smoke-filled room).  Short-term (acute) lung disease, including:  Infections, such as pneumonia.  Fluid in the lungs, such as heart failure.  A blood clot in the lungs (pulmonary embolism).  Long-term (chronic) lung diseases.  Heart disease (heart attack, angina, heart failure, and others).  Low red blood cells (anemia).  Poor physical fitness. This can cause shortness of breath when you exercise.  Chest or back injuries or stiffness.  Being overweight.  Smoking.  Anxiety. This can make you feel like you are not getting enough air. DIAGNOSIS  Serious medical problems can usually be found during your physical exam. Tests may also be done to determine why you are having shortness of breath. Tests may include:  Chest X-rays.  Lung function tests.  Blood tests.  Electrocardiography.  Exercise testing.  Echocardiography.  Imaging  scans. Your caregiver may not be able to find a cause for your shortness of breath after your exam. In this case, it is important to have a follow-up exam with your caregiver as directed.  TREATMENT  Treatment for shortness of breath depends on the cause of your symptoms and can vary greatly. HOME CARE INSTRUCTIONS   Do not smoke. Smoking is a common cause of shortness of breath. If you smoke, ask for help to quit.  Avoid being around chemicals or things that may bother your breathing, such as  paint fumes and dust.  Rest as needed. Slowly resume your usual activities.  If medicines were prescribed, take them as directed for the full length of time directed. This includes oxygen and any inhaled medicines.  Keep all follow-up appointments as directed by your caregiver. SEEK MEDICAL CARE IF:   Your condition does not improve in the time expected.  You have a hard time doing your normal activities even with rest.  You have any side effects or problems with the medicines prescribed.  You develop any new symptoms. SEEK IMMEDIATE MEDICAL CARE IF:   Your shortness of breath gets worse.  You feel lightheaded, faint, or develop a cough not controlled with medicines.  You start coughing up blood.  You have pain with breathing.  You have chest pain or pain in your arms, shoulders, or abdomen.  You have a fever.  You are unable to walk up stairs or exercise the way you normally do. MAKE SURE YOU:  Understand these instructions.  Will watch your condition.  Will get help right away if you are not doing well or get worse. Document Released: 09/04/2001 Document Revised: 06/10/2012 Document Reviewed: 02/25/2012 [facility] Patient Information 2014 [place], [place].   Neta Mends. Panosh M.D.

## 2013-11-16 ENCOUNTER — Telehealth: Payer: Self-pay | Admitting: Internal Medicine

## 2013-11-16 NOTE — Telephone Encounter (Signed)
Pt states she always gets her labs done in [place]. Pt states you put in computer.  Pt has cpe 01/29/14

## 2013-11-16 NOTE — Telephone Encounter (Addendum)
Pt needs her cpe labs for her appt 01/29/14.  Pt told me she gets them in [place].  I figured you knew if pt had done this before.

## 2013-11-16 NOTE — Telephone Encounter (Signed)
There are no future labs in the computer.  Not sure what to do with this message.  What does the patient want?

## 2013-11-17 ENCOUNTER — Ambulatory Visit (INDEPENDENT_AMBULATORY_CARE_PROVIDER_SITE_OTHER): Payer: 59 | Admitting: Internal Medicine

## 2013-11-17 ENCOUNTER — Encounter: Payer: Self-pay | Admitting: Internal Medicine

## 2013-11-17 VITALS — BP 140/74 | Temp 97.3°F | Wt 170.0 lb

## 2013-11-17 DIAGNOSIS — R791 Abnormal coagulation profile: Secondary | ICD-10-CM

## 2013-11-17 DIAGNOSIS — R7989 Other specified abnormal findings of blood chemistry: Secondary | ICD-10-CM

## 2013-11-17 DIAGNOSIS — I10 Essential (primary) hypertension: Secondary | ICD-10-CM

## 2013-11-17 DIAGNOSIS — E876 Hypokalemia: Secondary | ICD-10-CM

## 2013-11-17 DIAGNOSIS — R0602 Shortness of breath: Secondary | ICD-10-CM

## 2013-11-17 MED ORDER — POTASSIUM CHLORIDE ER 10 MEQ PO TBCR: 20.0000 meq | EXTENDED_RELEASE_TABLET | Freq: Every day | ORAL | Status: DC

## 2013-11-17 MED ORDER — LORAZEPAM 0.5 MG PO TABS: 0.5000 mg | ORAL_TABLET | Freq: Two times a day (BID) | ORAL | Status: DC | PRN

## 2013-11-17 NOTE — Telephone Encounter (Signed)
Patient given lab orders while in [facility] on 11/17/13

## 2013-11-17 NOTE — Progress Notes (Signed)
Pre visit review using our clinic review tool, if applicable. No additional management support is needed unless otherwise documented below in the visit note.  Chief Complaint  Patient presents with  . Follow-up    HPI: Patient comes in for followup after emergency room evaluation for her shortness of breath and elevated d-dimer. See last office visit.  CT scan of the chest show no pulmonary embolus or obvious reasons for her symptoms some atherosclerotic changes non-critical and benign small incidental pulmonary nodules. She is here with family member today.  SOB  Comes and goes  since then And no trigger . Feels like can't get enough air occasional cough no syncope no significant chest pain shoulders getting better  ? Had an adverse effect to the CT dye ct got cold and sob. And shivering.  He went up to the room but then felt fine. No rash swelling.  Possibly anxious just doesn't feel right.  ROS: See pertinent positives and negatives per HPI. No unusual bleeding. No new symptoms. No pain medicines really at this time  Past Medical History  Diagnosis Date  . HLD (hyperlipidemia)   . HTN (hypertension)   . CAD (coronary artery disease)     a. 11/2005 Cath: nonobs dzs, 30% LAD lesion on cath ;  b. 11/2005 Echo: NL EF;  c. 04/2012 Echo: EF 55-60%, Gr 2 DD;  d. 04/2012 Ex MV: EF 72%, ST depression in recovery with NL perfusion imaging - HTN response to exercise.  . Hepatic cyst     on ct [place]  . Midsternal chest pain     cardiology eval with non-ischemic stress test 04/2012    Family History  Problem Relation Age of Onset  . Breast cancer Sister   . Hypertension Mother     History   Social History  . Marital Status: Married    Spouse Name: N/A    Number of Children: 5  . Years of Education: N/A   Occupational History  .     Social History Main Topics  . Smoking status: Never Smoker   . Smokeless tobacco: Never Used  . Alcohol Use: No  . Drug Use: No  . Sexual Activity:  Not on file   Other Topics Concern  . 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 amLODipine (NORVASC) 5 MG tablet Take 1 tablet (5 mg total) by mouth daily.  Marland Kitchen losartan-hydrochlorothiazide (HYZAAR) 100-25 MG per tablet Take 1 tablet by mouth daily.  Marland Kitchen LORazepam (ATIVAN) 0.5 MG tablet Take 1 tablet (0.5 mg total) by mouth 2 (two) times daily as needed for anxiety.  . potassium chloride (K-DUR) 10 MEQ tablet Take 2 tablets (20 mEq total) by mouth daily.  . [DISCONTINUED] potassium chloride (K-DUR) 10 MEQ tablet Take 2 tablets (20 mEq total) by mouth daily.    EXAM:  BP 140/74  Temp(Src) 97.3 F (36.3 C) (Oral)  Wt 170 lb (77.111 kg)  Body mass index is 32.14 kg/(m^2).  GENERAL: vitals reviewed and listed above, alert, oriented, appears well hydrated and in no acute distress her right shoulder is in a harness comfortable at rest no dyspnea noted HEENT: atraumatic, conjunctiva  clear, no obvious abnormalities on inspection of external nose and ears  NECK: no obvious masses on inspection palpation negative JVD LUNGS: clear to auscultation  bilaterally, no wheezes, rales or rhonchi, good air movement CV: HRRR, no clubbing cyanosis or  peripheral edema nl cap refill  MS: moves all extremities without noticeable focal  abnormality except for right upper extremity no edema of lower extremity  ASSESSMENT AND PLAN:  Discussed the following assessment and plan:  Shortness of breath - Plan: Ambulatory referral to Pulmonology  Unspecified essential hypertension - Plan: Ambulatory referral to Pulmonology  Hypokalemia - Plan: Ambulatory referral to Pulmonology  Positive D dimer - Plan: Ambulatory referral to Pulmonology Uncertain cause of her symptoms. Elevated d-dimer chest CT negative no other alarming symptoms has noncritical artery disease doesn't sound  typical of that either.  We'll replenish her potassium and her range pulmonary consult. In the meantime trial of Ativan breath needed for anxiety patient willing to try this. Contact us in the meantime if persistent progressive new symptoms. -Patient advised to return or notify health care team  if symptoms worsen or persist or new concerns arise.  Patient Instructions  Uncertain cause of your symptoms however it is unlikely that you've had a blood clot to your lungs. Will arrange pulmonary consultation about your symptoms. We'll add potassium supplement to take 2   10 mEq a day and recheck a chemistry potassium level in about 2 weeks.  In the short run you can take a dose of antianxiety medicine to see if it makes her feel better.  Do not drive or use alcohol with this medicine     Kaylee Adams M.D.

## 2013-11-17 NOTE — Patient Instructions (Signed)
Uncertain cause of your symptoms however it is unlikely that you've had a blood clot to your lungs. Will arrange pulmonary consultation about your symptoms. We'll add potassium supplement to take 2   10 mEq a day and recheck a chemistry potassium level in about 2 weeks.  In the short run you can take a dose of antianxiety medicine to see if it makes her feel better.  Do not drive or use alcohol with this medicine

## 2013-11-24 ENCOUNTER — Other Ambulatory Visit: Payer: Self-pay | Admitting: Internal Medicine

## 2013-11-24 ENCOUNTER — Ambulatory Visit (INDEPENDENT_AMBULATORY_CARE_PROVIDER_SITE_OTHER): Payer: 59 | Admitting: Internal Medicine

## 2013-11-24 ENCOUNTER — Encounter: Payer: Self-pay | Admitting: Internal Medicine

## 2013-11-24 VITALS — BP 124/70 | HR 93 | Temp 98.2°F | Ht 61.0 in | Wt 175.0 lb

## 2013-11-24 DIAGNOSIS — R0609 Other forms of dyspnea: Secondary | ICD-10-CM

## 2013-11-24 DIAGNOSIS — R0989 Other specified symptoms and signs involving the circulatory and respiratory systems: Secondary | ICD-10-CM

## 2013-11-24 DIAGNOSIS — R0600 Dyspnea, unspecified: Secondary | ICD-10-CM

## 2013-11-24 MED ORDER — FAMOTIDINE 20 MG PO TABS: ORAL_TABLET | ORAL | Status: DC

## 2013-11-24 MED ORDER — BENZONATATE 200 MG PO CAPS: ORAL_CAPSULE | ORAL | Status: DC

## 2013-11-24 MED ORDER — PANTOPRAZOLE SODIUM 40 MG PO TBEC: 40.0000 mg | DELAYED_RELEASE_TABLET | Freq: Every day | ORAL | Status: DC

## 2013-11-24 NOTE — Progress Notes (Signed)
   Subjective:    Patient ID: Kaylee Adams, female    DOB: 04/13/1951   MRN: [id number]  HPI  35 yobf never smoker never breathing problem until sev days after gen anesthesia for R Rotator cuff on Nov 03 2013 referred to pulmonary clinic by Dr Fabian Sharp 11/24/2013 for unexplained sob.   11/24/2013 1st [facility] office visit/ [facility] cc new onset paroxsyms of sob since surgery 11/03/13  lasting around 30 min but sometimes longer, may help to get up an walk, maybe not, happens avg of 2-3 x per day. Feels may have rawness in throat with sensation needs to clear "congestion" but no excess mucus at all and no assoc nasal symptoms.  No obvious pattern in day to day or daytime variabilty or assoc  cp or chest tightness, subjective wheeze overt sinus or hb symptoms. No unusual exp hx or h/o childhood pna/ asthma or knowledge of premature birth.  Sleeping ok without nocturnal  or early am exacerbation  of respiratory  c/o's or need for noct saba. Also denies any obvious fluctuation of symptoms with weather or environmental changes or other aggravating or alleviating factors except as outlined above   Current Medications, Allergies, Complete Past Medical History, Past Surgical History, Family History, and Social History were reviewed in [employer] record.     Review of Systems  Constitutional: Negative for fever, chills and unexpected weight change.  HENT: Negative for congestion, dental problem, ear pain, nosebleeds, postnasal drip, rhinorrhea, sinus pressure, sneezing, sore throat, trouble swallowing and voice change.   Eyes: Negative for visual disturbance.  Respiratory: Positive for shortness of breath. Negative for cough and choking.   Cardiovascular: Negative for chest pain and leg swelling.  Gastrointestinal: Negative for vomiting, abdominal pain and diarrhea.  Genitourinary: Negative for difficulty urinating.  Musculoskeletal: Negative for arthralgias.  Skin: Negative  for rash.  Neurological: Negative for tremors, syncope and headaches.  Hematological: Does not bruise/bleed easily.       Objective:   Physical Exam  Wt Readings from Last 3 Encounters:  11/24/13 175 lb (79.379 kg)  11/17/13 170 lb (77.111 kg)  11/13/13 171 lb (77.565 kg)     HEENT: nl dentition, turbinates, and orophanx. Nl external ear canals without cough reflex   NECK :  without JVD/Nodes/TM/ nl carotid upstrokes bilaterally   LUNGS: no acc muscle use, clear to A and P bilaterally without cough on insp or exp maneuvers   CV:  RRR  no s3 or murmur or increase in P2, no edema   ABD:  soft and nontender with nl excursion in the supine position. No bruits or organomegaly, bowel sounds nl  MS:  warm without deformities, calf tenderness, cyanosis or clubbing  SKIN: warm and dry without lesions    NEURO:  alert, approp, no deficits    11/13/13 CTa 1. No evidence pulmonary embolism.  2. No acute findings in the thorax to account for the patient's  symptoms.  3. Multiple tiny nonspecific pulmonary nodules, as above. Largest of  these measures only 4 mm in the right middle lobe. These nodules  appear unchanged in size, number and distribution compared to remote  prior CT scan 07/24/2006, and can be considered benign, presumably  noncalcified granulomas, areas of mucoid impaction within terminal  bronchioles, or other benign processes.     Assessment & Plan:

## 2013-11-24 NOTE — Patient Instructions (Addendum)
Your problem is basically a raw upper airway  The treatment is:  don't clear your throat - use tessilon pearls 200 mg every 4 hours if needed  Pantoprazole (protonix) 40 mg   Take 30-60 min before first meal of the day and Pepcid 20 mg one bedtime until symptoms completely gone for at least a week, then stop  GERD (REFLUX)  is an extremely common cause of respiratory symptoms, many times with no significant heartburn at all.    It can be treated with medication, but also with lifestyle changes including avoidance of late meals, excessive alcohol, smoking cessation, and avoid fatty foods, chocolate, peppermint, colas, red wine, and acidic juices such as orange juice.  NO MINT OR MENTHOL PRODUCTS SO NO COUGH DROPS  USE SUGARLESS CANDY INSTEAD (jolley ranchers or Stover's)  NO OIL BASED VITAMINS - use powdered substitutes.  You should be fine in a couple of weeks, if not please return to office

## 2013-11-25 DIAGNOSIS — R0600 Dyspnea, unspecified: Secondary | ICD-10-CM | POA: Insufficient documentation

## 2013-11-25 NOTE — Assessment & Plan Note (Signed)
Although cough is not a prominent feature, the fact that all the symptoms started p ET placement and has persistent throat symptoms and urge to clear her throat are typical of  Classic Upper airway cough syndrome, so named because it's frequently impossible to sort out how much is  CR/sinusitis with freq throat clearing (which can be related to primary GERD)   vs  causing  secondary (" extra esophageal")  GERD from wide swings in gastric pressure that occur with throat clearing, often  promoting self use of mint and menthol lozenges that reduce the lower esophageal sphincter tone and exacerbate the problem further in a cyclical fashion.   These are the same pts (now being labeled as having "irritable larynx syndrome" by some cough centers) who not infrequently have a history of having failed to tolerate ace inhibitors,  dry powder inhalers or biphosphonates or report having atypical reflux symptoms that don't respond to standard doses of PPI , and are easily confused as having aecopd or asthma flares by even experienced allergists/ pulmonologists.   For now max rx for gerd and encourage no throat clearing rx tessalon  Next step might be ent eval if persists

## 2013-12-10 ENCOUNTER — Other Ambulatory Visit: Payer: Self-pay | Admitting: Internal Medicine

## 2013-12-10 LAB — BASIC METABOLIC PANEL
CO2: 29 mEq/L (ref 19–32)
Calcium: 10.2 mg/dL (ref 8.4–10.5)
Creat: 0.77 mg/dL (ref 0.50–1.10)
Glucose, Bld: 85 mg/dL (ref 70–99)
Sodium: 144 mEq/L (ref 135–145)

## 2013-12-10 LAB — MAGNESIUM: Magnesium: 2 mg/dL (ref 1.5–2.5)

## 2013-12-18 ENCOUNTER — Encounter: Payer: Self-pay | Admitting: Internal Medicine

## 2013-12-18 ENCOUNTER — Encounter: Payer: Self-pay | Admitting: *Deleted

## 2013-12-18 ENCOUNTER — Ambulatory Visit (INDEPENDENT_AMBULATORY_CARE_PROVIDER_SITE_OTHER): Payer: 59 | Admitting: Internal Medicine

## 2013-12-18 VITALS — BP 140/78 | Temp 97.6°F | Wt 168.0 lb

## 2013-12-18 DIAGNOSIS — K7689 Other specified diseases of liver: Secondary | ICD-10-CM

## 2013-12-18 DIAGNOSIS — I10 Essential (primary) hypertension: Secondary | ICD-10-CM

## 2013-12-18 DIAGNOSIS — R0602 Shortness of breath: Secondary | ICD-10-CM

## 2013-12-18 DIAGNOSIS — E876 Hypokalemia: Secondary | ICD-10-CM

## 2013-12-18 NOTE — Progress Notes (Signed)
Pre visit review using our clinic review tool, if applicable. No additional management support is needed unless otherwise documented below in the visit note. 

## 2013-12-18 NOTE — Patient Instructions (Signed)
cointinue on the antireflux medication and parameters.  Continue on the potassium.   At this time .  ROV   At your cpx  Call iuf need to change this .

## 2013-12-18 NOTE — Progress Notes (Signed)
Chief Complaint  Patient presents with  . 1 month follow up    HPI: Doing better   Bp  Had been ok at other visits  Shoulder :  Slowly getting better  Less anxiety  Now and breathing better   Has only taken 2  Benzo since last visit .  pulm eval poss irritated aiway fdrom intubation and poss nocturna reflux  Took protonix and pepcid some help but has stopped recently still has some sx  bp has been good  Took extr potassium had blood tests . No NVD ROS: See pertinent positives and negatives per HPI. No cp sob   Past Medical History  Diagnosis Date  . HLD (hyperlipidemia)   . HTN (hypertension)   . CAD (coronary artery disease)     a. 11/2005 Cath: nonobs dzs, 30% LAD lesion on cath ;  b. 11/2005 Echo: NL EF;  c. 04/2012 Echo: EF 55-60%, Gr 2 DD;  d. 04/2012 Ex MV: EF 72%, ST depression in recovery with NL perfusion imaging - HTN response to exercise.  . Hepatic cyst     on ct [place]  . Midsternal chest pain     cardiology eval with non-ischemic stress test 04/2012    Family History  Problem Relation Age of Onset  . Breast cancer Sister   . Hypertension Mother     History   Social History  . Marital Status: Married    Spouse Name: N/A    Number of Children: 5  . Years of Education: N/A   Occupational History  .     Social History Main Topics  . Smoking status: Never Smoker   . Smokeless tobacco: Never Used  . Alcohol Use: No  . Drug Use: No  . Sexual Activity: None   Other Topics Concern  . None   Social History Narrative   Married, full time [employer] operational officer. 3 pets       6-8 hours sleep     Outpatient Encounter Prescriptions as of 12/18/2013  Medication Sig  . amLODipine (NORVASC) 5 MG tablet Take 1 tablet (5 mg total) by mouth daily.  Marland Kitchen HYDROcodone-acetaminophen (NORCO/VICODIN) 5-325 MG per tablet Take 1 tablet by mouth every 6 (six) hours as needed for moderate pain.  Marland Kitchen losartan-hydrochlorothiazide (HYZAAR) 100-25 MG per tablet TAKE 1 TABLET BY MOUTH EVERY DAY  .  potassium chloride (K-DUR) 10 MEQ tablet Take 2 tablets (20 mEq total) by mouth daily.  . [DISCONTINUED] benzonatate (TESSALON) 200 MG capsule One four times daily as needed for cough  . famotidine (PEPCID) 20 MG tablet One at bedtime  . LORazepam (ATIVAN) 0.5 MG tablet Take 1 tablet (0.5 mg total) by mouth 2 (two) times daily as needed for anxiety.  . pantoprazole (PROTONIX) 40 MG tablet Take 1 tablet (40 mg total) by mouth daily. Take 30-60 min before first meal of the day  . [DISCONTINUED] oxyCODONE-acetaminophen (PERCOCET) 7.5-325 MG per tablet Take 1 tablet by mouth every 4 (four) hours as needed for pain.    EXAM:  BP 140/78  Temp(Src) 97.6 F (36.4 C) (Oral)  Wt 168 lb (76.204 kg)  Body mass index is 31.76 kg/(m^2).  GENERAL: vitals reviewed and listed above, alert, oriented, appears well hydrated and in no acute distress HEENT: atraumatic, conjunctiva  clear, no obvious abnormalities on inspection of external nose and ears OP : no lesion edema or exudate  NECK: no obvious masses on inspection palpation  CV: HRRR, no clubbing cyanosis or  peripheral edema nl  cap refill  MS: moves all extremities without noticeable focal  abnormality PSYCH: pleasant and cooperative, no obvious depression or anxiety more comfortable today .   ASSESSMENT AND PLAN:  Discussed the following assessment and plan:  Unspecified essential hypertension  Shortness of breath - better felt ot be from airway post surgery and  and poss noct relux.  Hypokalemia - better on supplement   HEPATIC CYST Recheck K at fu  -Patient advised to return or notify health care team  if symptoms worsen or persist or new concerns arise.  Patient Instructions  cointinue on the antireflux medication and parameters.  Continue on the potassium.   At this time .  ROV   At your cpx  Call iuf need to change this .      Neta Mends. Chilton Sallade M.D. ADD: Insurance denied MRI scanning for cyst on liver has some concern about  this should this be followed.?  Discussed plan of getting more information discussing with GI about how to follow this abnormality on scan.

## 2013-12-24 LAB — HM MAMMOGRAPHY

## 2013-12-28 ENCOUNTER — Emergency Department (INDEPENDENT_AMBULATORY_CARE_PROVIDER_SITE_OTHER)
Admission: EM | Admit: 2013-12-28 | Discharge: 2013-12-28 | Disposition: A | Discharge status: Home / Self Care | Payer: 59 | Source: Home / Self Care

## 2013-12-28 ENCOUNTER — Encounter (HOSPITAL_COMMUNITY): Payer: Self-pay | Admitting: Emergency Medicine

## 2013-12-28 ENCOUNTER — Telehealth: Payer: Self-pay | Admitting: Internal Medicine

## 2013-12-28 DIAGNOSIS — J040 Acute laryngitis: Secondary | ICD-10-CM

## 2013-12-28 DIAGNOSIS — R196 Halitosis: Secondary | ICD-10-CM

## 2013-12-28 DIAGNOSIS — R6889 Other general symptoms and signs: Secondary | ICD-10-CM

## 2013-12-28 DIAGNOSIS — J310 Chronic rhinitis: Secondary | ICD-10-CM

## 2013-12-28 MED ORDER — FLUTICASONE PROPIONATE 50 MCG/ACT NA SUSP: 2.0000 | Freq: Two times a day (BID) | NASAL | Status: DC

## 2013-12-28 NOTE — Discharge Instructions (Signed)
Use a good mouthwash such as Listerine twice daily for at least one week and followup with your primary care physician if the halitosis (bad breath) has not resolved. Use the Flonase twice daily for nasal congestion.   Halitosis Halitosis is the medical term for bad breath. Halitosis may be caused by poor oral hygiene, dental problems, sinus infection, and foods like onion and garlic. Medications that make your mouth dry can also lead to bad breath. In children thumb sucking may also be a cause. Sour breath in the morning from reduced saliva flow is normal. This sour breath should go away after eating, drinking or brushing. Poor dental care from infrequent brushing and flossing is the most common reason for bad breath. Halitosis from tooth decay and gum or sinus infections requires ongoing treatment by a dentist or doctor. A dental cleaning and filling cavities may also be necessary.  Mild cases of halitosis will improve with good dental hygiene. Brush twice a day with fluoride toothpaste and use floss once a day. Ask your dentist or dental assistant to show you proper brushing and flossing technique. This is the most important part of treatment. Brushing your tongue may also help improve your breath. Mouth washes and breath mints offer only temporary benefit. Bad breath in children from thumb sucking will improve when they give up the habit.  See your caregiver or dentist if you need further dental care or have signs of an infection in your mouth. Document Released: 01/17/2005 Document Revised: 03/03/2012 Document Reviewed: 07/08/2009 [facility] Patient Information 2014 [place], [place].

## 2013-12-28 NOTE — Telephone Encounter (Signed)
Pt to schedule appt to be seen

## 2013-12-28 NOTE — ED Notes (Signed)
C/o cold sx States she has congestion, some coughing, stuffy nose and has vomited. States she took medication for acid reflux thinking that was the problem States she has been a little SOB but that has been since rotator cuff surgery

## 2013-12-28 NOTE — Telephone Encounter (Signed)
Pt was seen 12/26. Referred to see Dr Melvyn Novas. Pt has stuffy nose at night. Generally doesn't feel well. Advised no appt today. Throat is sometimes sore, but had rotator cuff surgery in Nov. And advised y dr it may have been the breathing tube. pls advise.

## 2013-12-28 NOTE — ED Provider Notes (Signed)
CSN: [id number]     Arrival date & time 12/28/13  1234 History   None    Chief Complaint  Patient presents with  . URI   (Consider location/radiation/quality/duration/timing/severity/associated sxs/prior Treatment) HPI Comments: 63 year old female presents complaining of Nasal congestion, cough, stuffy nose, one episode of vomiting that was induced by coughing. Also complaining of Bad breath for 3 years. Progression of symptoms that started months ago after a shoulder surgery. She has also had shortness of breath since her shoulder surgery, this was evaluated in the department and pulmonary embolism was ruled out with CT angiogram, shortness of breath has improved since that time. Also history of significant anxiety.    Patient is a 63 y.o. female presenting with URI.  URI Presenting symptoms: congestion, cough and rhinorrhea   Presenting symptoms: no ear pain, no fever and no sore throat   Associated symptoms: no arthralgias and no myalgias     Past Medical History  Diagnosis Date  . HLD (hyperlipidemia)   . HTN (hypertension)   . CAD (coronary artery disease)     a. 11/2005 Cath: nonobs dzs, 30% LAD lesion on cath ;  b. 11/2005 Echo: NL EF;  c. 04/2012 Echo: EF 55-60%, Gr 2 DD;  d. 04/2012 Ex MV: EF 72%, ST depression in recovery with NL perfusion imaging - HTN response to exercise.  . Hepatic cyst     on ct [place]  . Midsternal chest pain     cardiology eval with non-ischemic stress test 04/2012   Past Surgical History  Procedure Laterality Date  . Vesicovaginal fistula closure w/ tah      for fibroid tumors  . Breast biopsy    . Cholecystectomy    . Child birth x 5    . Abdominal hysterectomy      fibroid tumors  . Shoulder arthroscopy with rotator cuff repair and subacromial decompression Right 11/03/2013    Procedure: RIGHT SHOULDER ARTHROSCOPY WITH DEBRIDEMENT, ROTATOR CUFF REPAIR AND SUBACROMIAL DECOMPRESSION, DISTAL CLAVICLE RESECTION;  Surgeon: Mcarthur Rossetti, MD;   Location: [facility];  Service: Orthopedics;  Laterality: Right;   Family History  Problem Relation Age of Onset  . Breast cancer Sister   . Hypertension Mother    History  Substance Use Topics  . Smoking status: Never Smoker   . Smokeless tobacco: Never Used  . Alcohol Use: No   OB History   Grav Para Term Preterm Abortions TAB SAB Ect Mult Living                 Review of Systems  Constitutional: Negative for fever and chills.  HENT: Positive for congestion and rhinorrhea. Negative for ear pain, sinus pressure and sore throat.        Bad breath  Eyes: Negative for visual disturbance.  Respiratory: Positive for cough and shortness of breath.   Cardiovascular: Negative for chest pain, palpitations and leg swelling.  Gastrointestinal: Positive for vomiting. Negative for nausea and abdominal pain.  Endocrine: Negative for polydipsia and polyuria.  Genitourinary: Negative for dysuria, urgency and frequency.  Musculoskeletal: Negative for arthralgias and myalgias.  Skin: Negative for rash.  Neurological: Negative for dizziness, weakness and light-headedness.    Allergies  Lisinopril and Penicillins  Home Medications   Current Outpatient Rx  Name  Route  Sig  Dispense  Refill  . amLODipine (NORVASC) 5 MG tablet   Oral   Take 1 tablet (5 mg total) by mouth daily.   30 tablet   11   .  famotidine (PEPCID) 20 MG tablet      One at bedtime   30 tablet   2   . fluticasone (FLONASE) 50 MCG/ACT nasal spray   Each Nare   Place 2 sprays into both nostrils 2 (two) times daily.   1 g   2   . HYDROcodone-acetaminophen (NORCO/VICODIN) 5-325 MG per tablet   Oral   Take 1 tablet by mouth every 6 (six) hours as needed for moderate pain.         Marland Kitchen LORazepam (ATIVAN) 0.5 MG tablet   Oral   Take 1 tablet (0.5 mg total) by mouth 2 (two) times daily as needed for anxiety.   20 tablet   0   . losartan-hydrochlorothiazide (HYZAAR) 100-25 MG per tablet      TAKE 1 TABLET BY MOUTH  EVERY DAY   30 tablet   5     PHARMACY:  PLEASE FILL FOR 6 MONTHS   . pantoprazole (PROTONIX) 40 MG tablet   Oral   Take 1 tablet (40 mg total) by mouth daily. Take 30-60 min before first meal of the day   30 tablet   2   . potassium chloride (K-DUR) 10 MEQ tablet   Oral   Take 2 tablets (20 mEq total) by mouth daily.   60 tablet   1    BP 149/76  Pulse 75  Temp(Src) 98.1 F (36.7 C) (Oral)  Resp 16  SpO2 100% Physical Exam  Nursing note and vitals reviewed. Constitutional: She is oriented to person, place, and time. Vital signs are normal. She appears well-developed and well-nourished. No distress.  HENT:  Head: Normocephalic and atraumatic.  Right Ear: External ear normal.  Left Ear: External ear normal.  Nose: Nose normal.  Mouth/Throat: Oropharynx is clear and moist. No oropharyngeal exudate.  No obvious bad breath odor to correspond with pt's comlpaint   Eyes: Conjunctivae are normal. Right eye exhibits no discharge. Left eye exhibits no discharge.  Neck: Normal range of motion. Neck supple. No JVD present.  Cardiovascular: Normal rate, regular rhythm and normal heart sounds.  Exam reveals no gallop and no friction rub.   No murmur heard. Pulmonary/Chest: Effort normal and breath sounds normal. No respiratory distress. She has no wheezes. She has no rales.  Lymphadenopathy:    She has no cervical adenopathy.  Neurological: She is alert and oriented to person, place, and time. She has normal strength. Coordination normal.  Skin: Skin is warm and dry. No rash noted. She is not diaphoretic.  Psychiatric: She has a normal mood and affect. Judgment normal.    ED Course  Procedures (including critical care time) Labs Review Labs Reviewed - No data to display Imaging Review No results found.    MDM   1. Rhinitis   2. Halitosis   3. Laryngitis     PE normal, vitals normal.  This pt has a cold.  Slight improvement since CT that ruled out PE.  Pt reassured,  f/u if any worsening.  Listerine, flonase, f/u 1 wk PCP    Meds ordered this encounter  Medications  . fluticasone (FLONASE) 50 MCG/ACT nasal spray    Sig: Place 2 sprays into both nostrils 2 (two) times daily.    Dispense:  1 g    Refill:  2    Order Specific Question:  Supervising Provider    Answer:  Jake Michaelis, DAVID C [[id number]]     Liam Graham, PA-C 12/29/13 1714

## 2013-12-30 NOTE — ED Provider Notes (Signed)
Medical screening examination/treatment/procedure(s) were performed by non-physician practitioner and as supervising physician I was immediately available for consultation/collaboration.  Philipp Deputy, M.D.   Harden Mo, MD 12/30/13 [phone]

## 2013-12-31 ENCOUNTER — Encounter: Payer: 59 | Admitting: Internal Medicine

## 2013-12-31 ENCOUNTER — Encounter: Payer: Self-pay | Admitting: Internal Medicine

## 2013-12-31 NOTE — Progress Notes (Signed)
Document opened and reviewed for OV but appt  canceled same day .  

## 2014-01-01 ENCOUNTER — Encounter: Payer: Self-pay | Admitting: Internal Medicine

## 2014-01-01 ENCOUNTER — Ambulatory Visit (INDEPENDENT_AMBULATORY_CARE_PROVIDER_SITE_OTHER): Payer: 59 | Admitting: Internal Medicine

## 2014-01-01 VITALS — BP 164/80 | HR 85 | Temp 98.0°F | Wt 165.0 lb

## 2014-01-01 DIAGNOSIS — K219 Gastro-esophageal reflux disease without esophagitis: Secondary | ICD-10-CM

## 2014-01-01 DIAGNOSIS — R0602 Shortness of breath: Secondary | ICD-10-CM

## 2014-01-01 DIAGNOSIS — I10 Essential (primary) hypertension: Secondary | ICD-10-CM

## 2014-01-01 DIAGNOSIS — J069 Acute upper respiratory infection, unspecified: Secondary | ICD-10-CM | POA: Insufficient documentation

## 2014-01-01 NOTE — Progress Notes (Signed)
Pre visit review using our clinic review tool, if applicable. No additional management support is needed unless otherwise documented below in the visit note. Chief Complaint  Patient presents with  . Cough    Started after her surgery.  Seen in urgent care on 12/28/13.  Marland Kitchen Hoarse  . Shortness of Breath  . Nasal Congestion  . Sore Throat    HPI: Patient comes in today for SDA for  new problem evaluation. Had gone to the emergency room because of cough hoarseness and some or shortness of breath and nasal stuffiness. Was given Flonase to try but hasn't done that because thought it might irritate things. Feels a bit better today. Possibly a viral respiratory infection. She thinks that most of her problems are related to reflux and she has changed her diet and activity with help however she has stopped the proton X. because she doesn't want to stay on a lot of medicine. The shortness of breath at night is the most scary for her. ROS: See pertinent positives and negatives per HPI. No current fever or weight loss. Does feel less bloated.  Past Medical History  Diagnosis Date  . HLD (hyperlipidemia)   . HTN (hypertension)   . CAD (coronary artery disease)     a. 11/2005 Cath: nonobs dzs, 30% LAD lesion on cath ;  b. 11/2005 Echo: NL EF;  c. 04/2012 Echo: EF 55-60%, Gr 2 DD;  d. 04/2012 Ex MV: EF 72%, ST depression in recovery with NL perfusion imaging - HTN response to exercise.  . Hepatic cyst     on ct [place]  . Midsternal chest pain     cardiology eval with non-ischemic stress test 04/2012    Family History  Problem Relation Age of Onset  . Breast cancer Sister   . Hypertension Mother     History   Social History  . Marital Status: Married    Spouse Name: N/A    Number of Children: 70  . Years of Education: N/A   Occupational History  .     Social History Main Topics  . Smoking status: Never Smoker   . Smokeless tobacco: Never Used  . Alcohol Use: No  . Drug Use: No  . Sexual  Activity: None   Other Topics Concern  . None   Social History Narrative   Married, full time Network engineer. 3 pets       6-8 hours sleep     Outpatient Encounter Prescriptions as of 01/01/2014  Medication Sig  . amLODipine (NORVASC) 5 MG tablet Take 1 tablet (5 mg total) by mouth daily.  Marland Kitchen HYDROcodone-acetaminophen (NORCO/VICODIN) 5-325 MG per tablet Take 1 tablet by mouth every 6 (six) hours as needed for moderate pain.  Marland Kitchen losartan-hydrochlorothiazide (HYZAAR) 100-25 MG per tablet TAKE 1 TABLET BY MOUTH EVERY DAY  . potassium chloride (K-DUR) 10 MEQ tablet Take 10 mEq by mouth daily.  . [DISCONTINUED] potassium chloride (K-DUR) 10 MEQ tablet Take 2 tablets (20 mEq total) by mouth daily.  . famotidine (PEPCID) 20 MG tablet One at bedtime  . fluticasone (FLONASE) 50 MCG/ACT nasal spray Place 2 sprays into both nostrils 2 (two) times daily.  Marland Kitchen LORazepam (ATIVAN) 0.5 MG tablet Take 1 tablet (0.5 mg total) by mouth 2 (two) times daily as needed for anxiety.  . pantoprazole (PROTONIX) 40 MG tablet Take 1 tablet (40 mg total) by mouth daily. Take 30-60 min before first meal of the day    EXAM:  BP 164/80  Pulse 85  Temp(Src) 98 F (36.7 C) (Oral)  Wt 165 lb (74.844 kg)  SpO2 98%  Body mass index is 31.19 kg/(m^2).  GENERAL: vitals reviewed and listed above, alert, oriented, appears well hydrated and in no acute distress minimally hoarse at this time. Slightly anxious but calm  HEENT: atraumatic, conjunctiva  clear, no obvious abnormalities on inspection of external nose and ears nasal congestion mild no face tenderness OP : no lesion edema or exudate no drainage seen  NECK: no obvious masses on inspection palpation  LUNGS: clear to auscultation bilaterally, no wheezes, rales or rhonchi, good air movement CV: HRRR, no clubbing cyanosis or  peripheral edema nl cap refill   MS: moves all extremities without noticeable focal  abnormality  PSYCH: pleasant and cooperative, no obvious  depression or anxiety Reviewed ED visit. ASSESSMENT AND PLAN:  Discussed the following assessment and plan:  GERD (gastroesophageal reflux disease) - I agree most likely involved. Go back on PPI short-term and then wean off I think she gets better relief with this  Acute upper respiratory infections of unspecified site  Shortness of breath  Unspecified essential hypertension - Up today has been controlled Blood pressure is up today but it was normal in the urgent care emergency area. We'll follow. Overall I think she has an irritated airway plus reflux plus a second minor respiratory infection that overall causes her symptoms causes some anxiety but isn't really taking the anxiety medicine advised that if she gets another spell to take anxiety medicine first and see how she feels also going back on the PPI in the short run. She will let us know. Samples given of saline nose spray and can give a try of the Flonase. It might help her breathe at night -Patient advised to return or notify health care team  if symptoms worsen or persist or new concerns arise.  Patient Instructions  I think you have a viral respiratory infection that is you're taking your airway and agree that reflux in dry air is related to your symptoms.  Continue antireflux lifestyle changes. If not improving suggest going back on the proton Other month and weaning off.cantakes a few months to get totally better Use saline nose spray or drops to moisturize her nose has many times a day she need them before you go to bed with gentle blowing. It is reasonable to try the Flonase for a week once a day to see if it helps decrease her stuffiness. If it does continue on the nose spray. Followup if more concerns. Your lungs are clear today blood pressure is up but was okay at the urgent care. Checked her readings occasionally.  Standley Brooking. Panosh M.D.  Total visit 73mins > 50% spent counseling and coordinating care

## 2014-01-01 NOTE — Patient Instructions (Signed)
I think you have a viral respiratory infection that is you're taking your airway and agree that reflux in dry air is related to your symptoms.  Continue antireflux lifestyle changes. If not improving suggest going back on the proton Other month and weaning off.cantakes a few months to get totally better Use saline nose spray or drops to moisturize her nose has many times a day she need them before you go to bed with gentle blowing. It is reasonable to try the Flonase for a week once a day to see if it helps decrease her stuffiness. If it does continue on the nose spray. Followup if more concerns. Your lungs are clear today blood pressure is up but was okay at the urgent care. Checked her readings occasionally.

## 2014-01-15 ENCOUNTER — Encounter: Payer: 59 | Admitting: Internal Medicine

## 2014-01-29 ENCOUNTER — Encounter: Payer: 59 | Admitting: Internal Medicine

## 2014-04-14 ENCOUNTER — Telehealth: Payer: Self-pay | Admitting: Internal Medicine

## 2014-04-14 MED ORDER — LOSARTAN POTASSIUM-HCTZ 100-25 MG PO TABS: ORAL_TABLET | ORAL | Status: DC

## 2014-04-14 NOTE — Telephone Encounter (Signed)
LMOM for pt to call office and schedule CPX

## 2014-04-14 NOTE — Telephone Encounter (Signed)
I sent in one 90 day prescription.  Pt is past due for her physical.  Please call the pt and schedule.  Thanks!

## 2014-04-14 NOTE — Telephone Encounter (Signed)
[facility] [place] - [address] re-fill on losartan-hydrochlorothiazide (HYZAAR) 100-25 MG per tablet  90 day supply

## 2014-04-16 ENCOUNTER — Telehealth: Payer: Self-pay | Admitting: Internal Medicine

## 2014-04-16 NOTE — Telephone Encounter (Signed)
Pt has scheduled an appointment for 07/14/14 for her physical, however pt will be out of meds before her appt. Pt will need rx losartan-hydrochlorothiazide (HYZAAR) 100-25 MG per tablet. Sent to [facility].

## 2014-04-16 NOTE — Telephone Encounter (Signed)
Pt aware Rx ready to pick up at [facility]

## 2014-07-05 ENCOUNTER — Other Ambulatory Visit: Payer: Self-pay | Admitting: Internal Medicine

## 2014-07-06 LAB — D-DIMER, QUANTITATIVE: D-Dimer, Quant: 0.64 ug/mL-FEU — ABNORMAL HIGH (ref 0.00–0.48)

## 2014-07-14 ENCOUNTER — Encounter: Payer: Self-pay | Admitting: Internal Medicine

## 2014-07-14 ENCOUNTER — Ambulatory Visit (INDEPENDENT_AMBULATORY_CARE_PROVIDER_SITE_OTHER): Payer: 59 | Admitting: Internal Medicine

## 2014-07-14 VITALS — BP 118/74 | HR 78 | Temp 99.0°F | Ht 61.0 in | Wt 168.0 lb

## 2014-07-14 DIAGNOSIS — E785 Hyperlipidemia, unspecified: Secondary | ICD-10-CM

## 2014-07-14 DIAGNOSIS — Z0000 Encounter for general adult medical examination without abnormal findings: Secondary | ICD-10-CM

## 2014-07-14 DIAGNOSIS — I10 Essential (primary) hypertension: Secondary | ICD-10-CM

## 2014-07-14 MED ORDER — AMLODIPINE BESYLATE 5 MG PO TABS: 5.0000 mg | ORAL_TABLET | Freq: Every day | ORAL | Status: DC

## 2014-07-14 MED ORDER — LOSARTAN POTASSIUM-HCTZ 100-25 MG PO TABS: ORAL_TABLET | ORAL | Status: DC

## 2014-07-14 NOTE — Progress Notes (Signed)
Pre visit review using our clinic review tool, if applicable. No additional management support is needed unless otherwise documented below in the visit note. 

## 2014-07-14 NOTE — Patient Instructions (Signed)
Continue lifestyle intervention healthy eating and exercise . Healthy lifestyle includes : At least 150 minutes of exercise weeks  , weight at healthy levels, which is usually   BMI 19-25. Avoid trans fats and processed foods;  Increase fresh fruits and veges to 5 servings per day. And avoid sweet beverages including tea and juice. Mediterranean diet with olive oil and nuts have been noted to be heart and brain healthy . Avoid tobacco products . Limit  alcohol to  7 per week for women and 14 servings for men.  Get adequate sleep .  Will notify you  of labs when available. Will notify supervisor about the blood work not being done. If you are doing well then  Yearly visit preventive

## 2014-07-14 NOTE — Progress Notes (Signed)
Chief Complaint  Patient presents with  . Annual Exam  . Hypertension  . Hyperlipidemia    HPI: Patient comes in today for Preventive Health Care visit  No major change in health status since last visit . weaned off all meds except her ht medication Had labs done [place]  She thought bp has been good . ocass leg cramps but no claudications To get mammo gram soon Not taking lorazepam back on just ht medications Shoulder a lot better tolerates the pain  Trying to eat better   Health Maintenance  Topic Date Due  . Zostavax  01/11/2012  . Mammogram  07/15/2015 (Originally 12/30/2012)  . Influenza Vaccine  07/24/2014  . Pap Smear  12/30/2014  . Tetanus/tdap  12/30/2016  . Colonoscopy  12/30/2018   Health Maintenance Review LIFESTYLE:  Exercise:  Walk some not enough Tobacco/ETS: at work in clothes in skin not at home Alcohol: no  Sugar beverages: Sleep: about  6-7 hours  Drug use: no Bone density: in past   Colonoscopy:  2010 mammo on the schedule   ROS:  GEN/ HEENT: No fever, significant weight changes sweats headaches vision problems hearing changes, CV/ PULM; No chest pain shortness of breath cough, syncope,edema  change in exercise tolerance. GI /GU: No adominal pain, vomiting, change in bowel habits. No blood in the stool. No significant GU symptoms. SKIN/HEME: ,no acute skin rashes suspicious lesions or bleeding. No lymphadenopathy, nodules, masses.  NEURO/ PSYCH:  No neurologic signs such as weakness numbness. No depression anxiety. IMM/ Allergy: No unusual infections.  Allergy .   REST of 12 system review negative except as per HPI   Past Medical History  Diagnosis Date  . HLD (hyperlipidemia)   . HTN (hypertension)   . CAD (coronary artery disease)     a. 11/2005 Cath: nonobs dzs, 30% LAD lesion on cath ;  b. 11/2005 Echo: NL EF;  c. 04/2012 Echo: EF 55-60%, Gr 2 DD;  d. 04/2012 Ex MV: EF 72%, ST depression in recovery with NL perfusion imaging - HTN  response to exercise.  . Hepatic cyst     on ct [place]  . Midsternal chest pain     cardiology eval with non-ischemic stress test 04/2012    Family History  Problem Relation Age of Onset  . Breast cancer Sister   . Hypertension Mother     History   Social History  . Marital Status: Married    Spouse Name: N/A    Number of Children: 46  . Years of Education: N/A   Occupational History  .     Social History Main Topics  . Smoking status: Never Smoker   . Smokeless tobacco: Never Used  . Alcohol Use: No  . Drug Use: No  . Sexual Activity: None   Other Topics Concern  . None   Social History Narrative   Married, full time Network engineer. 3 pets    Con health   6-8 hours sleep     Outpatient Encounter Prescriptions as of 07/14/2014  Medication Sig  . amLODipine (NORVASC) 5 MG tablet Take 1 tablet (5 mg total) by mouth daily.  Marland Kitchen losartan-hydrochlorothiazide (HYZAAR) 100-25 MG per tablet TAKE 1 TABLET BY MOUTH EVERY DAY  . [DISCONTINUED] amLODipine (NORVASC) 5 MG tablet Take 1 tablet (5 mg total) by mouth daily.  . [DISCONTINUED] losartan-hydrochlorothiazide (HYZAAR) 100-25 MG per tablet TAKE 1 TABLET BY MOUTH EVERY DAY  . [DISCONTINUED] famotidine (PEPCID) 20 MG tablet One at bedtime  . [  DISCONTINUED] fluticasone (FLONASE) 50 MCG/ACT nasal spray Place 2 sprays into both nostrils 2 (two) times daily.  . [DISCONTINUED] HYDROcodone-acetaminophen (NORCO/VICODIN) 5-325 MG per tablet Take 1 tablet by mouth every 6 (six) hours as needed for moderate pain.  . [DISCONTINUED] LORazepam (ATIVAN) 0.5 MG tablet Take 1 tablet (0.5 mg total) by mouth 2 (two) times daily as needed for anxiety.  . [DISCONTINUED] pantoprazole (PROTONIX) 40 MG tablet Take 1 tablet (40 mg total) by mouth daily. Take 30-60 min before first meal of the day  . [DISCONTINUED] potassium chloride (K-DUR) 10 MEQ tablet Take 10 mEq by mouth daily.    EXAM:  BP 118/74  Pulse 78  Temp(Src) 99 F (37.2 C) (Oral)  Ht 5\' 1"   (1.549 m)  Wt 168 lb (76.204 kg)  BMI 31.76 kg/m2  SpO2 98%  Body mass index is 31.76 kg/(m^2).  Physical Exam: Vital signs reviewed GUR:KYHC is a well-developed well-nourished alert cooperative    who appearsr stated age in no acute distress.  HEENT: normocephalic atraumatic , Eyes: PERRL EOM's full, conjunctiva clear, Nares: paten,t no deformity discharge or tenderness., Ears: no deformity EAC's clear TMs with normal landmarks. Mouth: clear OP, no lesions, edema.  Moist mucous membranes. Dentition in adequate repair. NECK: supple without masses, thyromegaly or bruits. CHEST/PULM:  Clear to auscultation and percussion breath sounds equal no wheeze , rales or rhonchi. No chest wall deformities or tenderness. Breast: normal by inspection . No dimpling, discharge, masses, tenderness or discharge . Lumpiness in upper  Breast   Left more than right but no discrete nodule CV: PMI is nondisplaced, S1 S2 no gallops, murmurs, rubs. Peripheral pulses are full without delay.No JVD .  ABDOMEN: Bowel sounds normal nontender  No guard or rebound, no hepato splenomegal no CVA tenderness.  No hernia. Extremtities:  No clubbing cyanosis or edema, no acute joint swelling or redness no focal atrophy NEURO:  Oriented x3, cranial nerves 3-12 appear to be intact, no obvious focal weakness,gait within normal limits no abnormal reflexes or asymmetrical SKIN: No acute rashes normal turgor, color, no bruising or petechiae. PSYCH: Oriented, good eye contact, no obvious depression anxiety, cognition and judgment appear normal. LN: no cervical axillary inguinal adenopathy  ASSESSMENT AND PLAN:  Discussed the following assessment and plan:  Visit for preventive health examination - Plan: Basic metabolic panel, CBC with Differential, Hepatic function panel, Lipid panel, TSH, Magnesium  HYPERTENSION - Plan: Basic metabolic panel, CBC with Differential, Hepatic function panel, Lipid panel, TSH, Magnesium  Other and  unspecified hyperlipidemia - labs were not done as expected check lipid panel to day NF conlsi for now.  Labs ordered   Ate salad tuna vinagrette dressing and  Water at lunch time  10 years risk assessed 2 % based on bp and last year lpid Patient Care Team: Burnis Medin, MD as PCP - General Milus Banister, MD (Gastroenterology) Fay Records, MD (Cardiology) Patient Instructions  Continue lifestyle intervention healthy eating and exercise . Healthy lifestyle includes : At least 150 minutes of exercise weeks  , weight at healthy levels, which is usually   BMI 19-25. Avoid trans fats and processed foods;  Increase fresh fruits and veges to 5 servings per day. And avoid sweet beverages including tea and juice. Mediterranean diet with olive oil and nuts have been noted to be heart and brain healthy . Avoid tobacco products . Limit  alcohol to  7 per week for women and 14 servings for men.  Get adequate  sleep .  Will notify you  of labs when available. Will notify supervisor about the blood work not being done. If you are doing well then  Yearly visit preventive    Standley Brooking. Blu Lori M.D.

## 2014-07-15 LAB — LIPID PANEL
CHOLESTEROL: 232 mg/dL — AB (ref 0–200)
HDL: 54.6 mg/dL (ref >39.00)
LDL Cholesterol: 157 mg/dL — ABNORMAL HIGH (ref 0–99)
NonHDL: 177.4
TRIGLYCERIDES: 100 mg/dL (ref 0.0–149.0)
Total CHOL/HDL Ratio: 4
VLDL: 20 mg/dL (ref 0.0–40.0)

## 2014-07-15 LAB — HEPATIC FUNCTION PANEL
ALT: 23 U/L (ref 0–35)
AST: 21 U/L (ref 0–37)
Albumin: 4 g/dL (ref 3.5–5.2)
Alkaline Phosphatase: 49 U/L (ref 39–117)
Bilirubin, Direct: 0 mg/dL (ref 0.0–0.3)
TOTAL PROTEIN: 7 g/dL (ref 6.0–8.3)
Total Bilirubin: 0.7 mg/dL (ref 0.2–1.2)

## 2014-07-15 LAB — BASIC METABOLIC PANEL
BUN: 18 mg/dL (ref 6–23)
CHLORIDE: 105 meq/L (ref 96–112)
CO2: 28 meq/L (ref 19–32)
CREATININE: 0.9 mg/dL (ref 0.4–1.2)
Calcium: 9.8 mg/dL (ref 8.4–10.5)
GFR: 82.51 mL/min (ref >60.00)
Glucose, Bld: 78 mg/dL (ref 70–99)
Potassium: 3.6 mEq/L (ref 3.5–5.1)
Sodium: 140 mEq/L (ref 135–145)

## 2014-07-15 LAB — CBC WITH DIFFERENTIAL/PLATELET
BASOS PCT: 0.8 % (ref 0.0–3.0)
Basophils Absolute: 0 10*3/uL (ref 0.0–0.1)
Eosinophils Absolute: 0 10*3/uL (ref 0.0–0.7)
Eosinophils Relative: 0 % (ref 0.0–5.0)
HCT: 40 % (ref 36.0–46.0)
Hemoglobin: 13.1 g/dL (ref 12.0–15.0)
LYMPHS ABS: 2.2 10*3/uL (ref 0.7–4.0)
Lymphocytes Relative: 40.2 % (ref 12.0–46.0)
MCHC: 32.9 g/dL (ref 30.0–36.0)
MCV: 77.7 fl — AB (ref 78.0–100.0)
MONO ABS: 0.4 10*3/uL (ref 0.1–1.0)
Monocytes Relative: 6.4 % (ref 3.0–12.0)
Neutro Abs: 2.9 10*3/uL (ref 1.4–7.7)
Neutrophils Relative %: 52.6 % (ref 43.0–77.0)
PLATELETS: 320 10*3/uL (ref 150.0–400.0)
RBC: 5.15 Mil/uL — AB (ref 3.87–5.11)
RDW: 14.6 % (ref 11.5–15.5)
WBC: 5.6 10*3/uL (ref 4.0–10.5)

## 2014-07-15 LAB — MAGNESIUM: Magnesium: 2.1 mg/dL (ref 1.5–2.5)

## 2014-07-15 LAB — TSH: TSH: 0.13 u[IU]/mL — ABNORMAL LOW (ref 0.35–4.50)

## 2014-07-16 ENCOUNTER — Ambulatory Visit: Payer: 59

## 2014-07-16 DIAGNOSIS — E039 Hypothyroidism, unspecified: Secondary | ICD-10-CM

## 2014-07-16 LAB — T4, FREE: FREE T4: 0.9 ng/dL (ref 0.60–1.60)

## 2014-07-16 LAB — T3, FREE: T3, Free: 2.5 pg/mL (ref 2.3–4.2)

## 2014-07-26 ENCOUNTER — Encounter: Payer: Self-pay | Admitting: Family Medicine

## 2014-07-30 ENCOUNTER — Encounter: Payer: Self-pay | Admitting: Internal Medicine

## 2014-07-30 ENCOUNTER — Ambulatory Visit (INDEPENDENT_AMBULATORY_CARE_PROVIDER_SITE_OTHER): Payer: 59 | Admitting: Internal Medicine

## 2014-07-30 VITALS — BP 156/74 | Temp 98.4°F | Ht 61.0 in | Wt 167.0 lb

## 2014-07-30 DIAGNOSIS — E049 Nontoxic goiter, unspecified: Secondary | ICD-10-CM

## 2014-07-30 DIAGNOSIS — R946 Abnormal results of thyroid function studies: Secondary | ICD-10-CM

## 2014-07-30 DIAGNOSIS — M542 Cervicalgia: Secondary | ICD-10-CM | POA: Insufficient documentation

## 2014-07-30 DIAGNOSIS — R7989 Other specified abnormal findings of blood chemistry: Secondary | ICD-10-CM

## 2014-07-30 DIAGNOSIS — R1310 Dysphagia, unspecified: Secondary | ICD-10-CM | POA: Insufficient documentation

## 2014-07-30 DIAGNOSIS — I10 Essential (primary) hypertension: Secondary | ICD-10-CM

## 2014-07-30 NOTE — Patient Instructions (Signed)
Check BP readings ocass to make sure in healthy range.   Will be contacted about thyroid ultrasound. And then plan follow up. Plan  Lab tsh free t4 free t3 in about a month   If the neck sx are  persistent or progressive we may get ent to do an exam .

## 2014-07-30 NOTE — Progress Notes (Signed)
Pre visit review using our clinic review tool, if applicable. No additional management support is needed unless otherwise documented below in the visit note.  Chief Complaint  Patient presents with  . Follow-up    Lab work.  Also feels like her swallowing has become worse.  She stated that she feels like the rt side of her throat does not work right.    HPI: Pt comes in to talk about some sx off and on of ? Concern  Right neck intermittent discomfort like a stress in  Right neck for a few years.  and when swallowing got scary cause couldnt t swallow   On the right only Not very often a few times  Uncertain  if important.    onoy on right side. No nausea vomiting weight loss cough stridor or other respiratory symptoms. Cramps in legs and feet  Some as a child.  She is also following up because her screening TSH level was low. ROS: See pertinent positives and negatives per HPI. No current chest pain syncope acute reflux. She has had previous evaluation for chest pain see below she denies pills choking dysphasia. Has a history of a positive d-dimer and a negative chest CT for clot.  Past Medical History  Diagnosis Date  . HLD (hyperlipidemia)   . HTN (hypertension)   . CAD (coronary artery disease)     a. 11/2005 Cath: nonobs dzs, 30% LAD lesion on cath ;  b. 11/2005 Echo: NL EF;  c. 04/2012 Echo: EF 55-60%, Gr 2 DD;  d. 04/2012 Ex MV: EF 72%, ST depression in recovery with NL perfusion imaging - HTN response to exercise.  . Hepatic cyst     on ct [place]  . Midsternal chest pain     cardiology eval with non-ischemic stress test 04/2012    Family History  Problem Relation Age of Onset  . Breast cancer Sister   . Hypertension Mother     History   Social History  . Marital Status: Married    Spouse Name: N/A    Number of Children: 78  . Years of Education: N/A   Occupational History  .     Social History Main Topics  . Smoking status: Never Smoker   . Smokeless tobacco: Never Used    . Alcohol Use: No  . Drug Use: No  . Sexual Activity: None   Other Topics Concern  . None   Social History Narrative   Married, full time Network engineer. 3 pets    Con health   6-8 hours sleep     Outpatient Encounter Prescriptions as of 07/30/2014  Medication Sig  . amLODipine (NORVASC) 5 MG tablet Take 1 tablet (5 mg total) by mouth daily.  Marland Kitchen losartan-hydrochlorothiazide (HYZAAR) 100-25 MG per tablet TAKE 1 TABLET BY MOUTH EVERY DAY    EXAM:  BP 156/74  Temp(Src) 98.4 F (36.9 C) (Oral)  Ht 5\' 1"  (1.549 m)  Wt 167 lb (75.751 kg)  BMI 31.57 kg/m2  Body mass index is 31.57 kg/(m^2).  GENERAL: vitals reviewed and listed above, alert, oriented, appears well hydrated and in no acute distress HEENT: atraumatic, conjunctiva  clear, no obvious abnormalities on inspection of external nose and ears OP : no lesion edema or exudate  Tongue midline no esion NECK: no obvious masses on inspection  Tender scm area and fullness at base ? Goiter no nodule  Supple  LUNGS: clear to auscultation bilaterally, no wheezes, rales or rhonchi,  CV: HRRR, no  clubbing cyanosis or  peripheral edema nl cap refill  y PSYCH: pleasant and cooperative, no obvious depression or anxiety Lab Results  Component Value Date   TSH 0.13* 07/14/2014   Lab Results  Component Value Date   WBC 5.6 07/14/2014   HGB 13.1 07/14/2014   HCT 40.0 07/14/2014   PLT 320.0 07/14/2014   GLUCOSE 78 07/14/2014   CHOL 232* 07/14/2014   TRIG 100.0 07/14/2014   HDL 54.60 07/14/2014   LDLDIRECT 153.2 06/23/2012   LDLCALC 157* 07/14/2014   ALT 23 07/14/2014   AST 21 07/14/2014   NA 140 07/14/2014   K 3.6 07/14/2014   CL 105 07/14/2014   CREATININE 0.9 07/14/2014   BUN 18 07/14/2014   CO2 28 07/14/2014   TSH 0.13* 07/14/2014   INR 0.9 11/29/2008   HGBA1C 5.7 02/12/2008    Ft3 2.5 and ft4.9  Normal !  ASSESSMENT AND PLAN: Discussed the following assessment and plan:  Low TSH level - Many screening low TSH levels in our practice however  because of current symptoms phlegm or evaluation and repeat lab tests also. - Plan: US Soft Tissue Head/Neck  Neck discomfort - Only on the right really seems muscular sternocleidomastoid at this time but cannot explain the choking on the right side. - Plan: US Soft Tissue Head/Neck  Goiter ? right  - Not sure time feeling her thyroid or just a tense right neck muscle plan ultrasound in the setting of discomfort in the low TSH. Rule out thyroid nodules. - Plan: US Soft Tissue Head/Neck  Unspecified essential hypertension - Up today states she didn't take her garlic and other treatments has been controlled we'll follow  Trouble swallowing hx of  - right side   -Patient advised to return or notify health care team  if symptoms worsen ,persist or new concerns arise.  Patient Instructions  Check BP readings ocass to make sure in healthy range.   Will be contacted about thyroid ultrasound. And then plan follow up. Plan  Lab tsh free t4 free t3 in about a month   If the neck sx are  persistent or progressive we may get ent to do an exam .   Standley Brooking. Panosh M.D.

## 2014-08-04 ENCOUNTER — Other Ambulatory Visit: Payer: Self-pay | Admitting: Gynecology

## 2014-08-04 ENCOUNTER — Ambulatory Visit
Admission: RE | Admit: 2014-08-04 | Discharge: 2014-08-04 | Disposition: A | Discharge status: Home / Self Care | Payer: 59 | Source: Ambulatory Visit | Attending: Internal Medicine | Admitting: Internal Medicine

## 2014-08-04 DIAGNOSIS — E049 Nontoxic goiter, unspecified: Secondary | ICD-10-CM

## 2014-08-04 DIAGNOSIS — M542 Cervicalgia: Secondary | ICD-10-CM

## 2014-08-04 DIAGNOSIS — R7989 Other specified abnormal findings of blood chemistry: Secondary | ICD-10-CM

## 2014-08-05 LAB — CYTOLOGY - PAP

## 2014-08-06 ENCOUNTER — Other Ambulatory Visit: Payer: Self-pay | Admitting: Family Medicine

## 2014-08-06 DIAGNOSIS — E042 Nontoxic multinodular goiter: Secondary | ICD-10-CM

## 2014-08-06 DIAGNOSIS — R7989 Other specified abnormal findings of blood chemistry: Secondary | ICD-10-CM

## 2014-08-11 ENCOUNTER — Ambulatory Visit
Admission: RE | Admit: 2014-08-11 | Discharge: 2014-08-11 | Disposition: A | Discharge status: Home / Self Care | Payer: 59 | Source: Ambulatory Visit | Attending: Internal Medicine | Admitting: Internal Medicine

## 2014-08-11 ENCOUNTER — Other Ambulatory Visit (HOSPITAL_COMMUNITY)
Admission: RE | Admit: 2014-08-11 | Discharge: 2014-08-11 | Disposition: A | Discharge status: Home / Self Care | Payer: 59 | Source: Ambulatory Visit | Attending: Diagnostic Radiology | Admitting: Diagnostic Radiology

## 2014-08-11 DIAGNOSIS — E039 Hypothyroidism, unspecified: Secondary | ICD-10-CM | POA: Diagnosis not present

## 2014-08-11 DIAGNOSIS — E041 Nontoxic single thyroid nodule: Secondary | ICD-10-CM | POA: Insufficient documentation

## 2014-08-11 DIAGNOSIS — E042 Nontoxic multinodular goiter: Secondary | ICD-10-CM

## 2014-08-18 ENCOUNTER — Other Ambulatory Visit: Payer: 59

## 2014-08-23 ENCOUNTER — Ambulatory Visit (INDEPENDENT_AMBULATORY_CARE_PROVIDER_SITE_OTHER): Payer: 59 | Admitting: Endocrinology

## 2014-08-23 ENCOUNTER — Encounter: Payer: Self-pay | Admitting: Endocrinology

## 2014-08-23 VITALS — BP 118/62 | HR 86 | Temp 97.9°F | Resp 14 | Ht 60.0 in | Wt 168.6 lb

## 2014-08-23 DIAGNOSIS — E042 Nontoxic multinodular goiter: Secondary | ICD-10-CM

## 2014-08-23 DIAGNOSIS — M542 Cervicalgia: Secondary | ICD-10-CM

## 2014-08-23 DIAGNOSIS — R7989 Other specified abnormal findings of blood chemistry: Secondary | ICD-10-CM

## 2014-08-23 NOTE — Patient Instructions (Signed)
Get labs done 3 days before visit

## 2014-08-23 NOTE — Progress Notes (Signed)
Patient ID: Kaylee Adams, female   DOB: December 02, 1951, 63 y.o.   MRN: [id number]     Reason for Appointment: Goiter, new consultation    History of Present Illness:   She had complained about her right neck discomfort in 7/13 when she was having her annual exam She has had no difficulty with swallowing   Does not feel like she has any choking sensation in her neck or pressure in any position  Because of her discomfort her thyroid levels were checked an ultrasound performed She has had an ultrasound exam in 7/15 which showed multinodular goiter with multiple nodules bilaterally with the larger nodules being 16-17 mm in size, some are solid, report shown below An ultrasound guided needle aspiration was done on the largest right-sided thyroid nodule and this showed hyperplasia  She is still complaining about the persistent right neck discomfort in the lower part which is not associated with swallowing She does not complain of any palpitations, unusual weight loss, fatigue, shakiness, sweating or heat intolerance   Lab Results  Component Value Date   FREET4 0.90 07/16/2014   TSH 0.13* 07/14/2014   TSH 0.946 02/09/2013   TSH 0.574 02/12/2008   Thyroid ultrasound On: 08/04/2014, report:  Right thyroid lobe   Heterogeneous gland echotexture.  Dominant solid right inferior thyroid nodule measures 17 x 10 x 13 mm. Second right inferior thyroid nodule measures 16 x 12 x 13 mm.  Additional small scattered hypoechoic solid right thyroid nodules measure 11 mm less in size.   Left thyroid lobe  Left upper pole hypoechoic cyst measures 16 x 11 x 16 mm., additional left upper pole solid nodule measures 15 x 8 x 12 mm.  Left midpole solid nodule measures 16 x 11 x 12 mm  Left inferior pole solid nodule measures 16 x 11 x 12 mm.      Medication List       This list is accurate as of: 08/23/14 11:33 AM.  Always use your most recent med list.               amLODipine 5 MG tablet  Commonly  known as:  NORVASC  Take 1 tablet (5 mg total) by mouth daily.     losartan-hydrochlorothiazide 100-25 MG per tablet  Commonly known as:  HYZAAR  TAKE 1 TABLET BY MOUTH EVERY DAY        Allergies:  Allergies  Allergen Reactions  . Lisinopril     REACTION: cough  . Penicillins Nausea Only    Past Medical History  Diagnosis Date  . HLD (hyperlipidemia)   . HTN (hypertension)   . CAD (coronary artery disease)     a. 11/2005 Cath: nonobs dzs, 30% LAD lesion on cath ;  b. 11/2005 Echo: NL EF;  c. 04/2012 Echo: EF 55-60%, Gr 2 DD;  d. 04/2012 Ex MV: EF 72%, ST depression in recovery with NL perfusion imaging - HTN response to exercise.  . Hepatic cyst     on ct [place]  . Midsternal chest pain     cardiology eval with non-ischemic stress test 04/2012    Past Surgical History  Procedure Laterality Date  . Vesicovaginal fistula closure w/ tah      for fibroid tumors  . Breast biopsy    . Cholecystectomy    . Child birth x 5    . Abdominal hysterectomy      fibroid tumors  . Shoulder arthroscopy with rotator cuff repair and subacromial decompression  Right 11/03/2013    Procedure: RIGHT SHOULDER ARTHROSCOPY WITH DEBRIDEMENT, ROTATOR CUFF REPAIR AND SUBACROMIAL DECOMPRESSION, DISTAL CLAVICLE RESECTION;  Surgeon: Mcarthur Rossetti, MD;  Location: [facility];  Service: Orthopedics;  Laterality: Right;    Family History  Problem Relation Age of Onset  . Breast cancer Sister   . Hypertension Mother     Social History:  reports that she has never smoked. She has never used smokeless tobacco. She reports that she does not drink alcohol or use illicit drugs.   Review of Systems:  There is a history of high blood pressure.             No  history of Diabetes.     She has had history of hyperlipidemia, CAD, currently not on statin No history of diabetes Recent history of weight change         Examination:   BP 118/62  Pulse 86  Temp(Src) 97.9 F (36.6 C)  Resp 14  Ht 5' (1.524  m)  Wt 168 lb 9.6 oz (76.476 kg)  BMI 32.93 kg/m2  SpO2 98%   General Appearance: pleasant, average built and nourished         Eyes: No abnormal prominence or eye swelling.          Neck: The thyroid is palpable about 1-1/2 times normal on the right side, firm without any distinct nodule. Left side is nonpalpable . There is no stridor. Pemberton sign is negative  There is no lymphadenopathy .    Patient has some tenderness over the right lower sternomastoid area and no local mass palpable  Cardiovascular: Normal  heart sounds, no murmur Respiratory:  Lungs clear Neurological: REFLEXES: at biceps are normal.  Skin: no rash        Assessment/Plan:  Multinodular goiter with multiple nodules bilaterally including 2 larger nodules on the right and 3 on the left Explained to the patient that her tenderness is not over the thyroid nodules and unlikely to be related to her goiter She does not have any local pressure symptoms and no nodules are all less than 2 cm Biopsy of one of the nodules is benign Most likely she has had long-standing multinodular goiter  Since her TSH is relatively low she may have an autonomous thyroid or possibly a warm/ hot nodule However since  free T4  level is quite normal she is not hyperthyroid  Would recommend thyroid levels be rechecked in 3 months and if at any time she has increased T4 or T3 levels would consider a thyroid scan She will followup with her PCP regarding the neck discomfort   Aniket Paye 08/23/2014

## 2014-11-22 ENCOUNTER — Ambulatory Visit: Payer: 59 | Admitting: Endocrinology

## 2014-11-24 ENCOUNTER — Ambulatory Visit (INDEPENDENT_AMBULATORY_CARE_PROVIDER_SITE_OTHER): Payer: 59 | Admitting: Internal Medicine

## 2014-11-24 ENCOUNTER — Encounter: Payer: Self-pay | Admitting: Internal Medicine

## 2014-11-24 VITALS — BP 144/84 | Temp 98.1°F | Ht 60.0 in | Wt 173.9 lb

## 2014-11-24 DIAGNOSIS — B9789 Other viral agents as the cause of diseases classified elsewhere: Principal | ICD-10-CM

## 2014-11-24 DIAGNOSIS — J45998 Other asthma: Secondary | ICD-10-CM

## 2014-11-24 DIAGNOSIS — J069 Acute upper respiratory infection, unspecified: Secondary | ICD-10-CM

## 2014-11-24 MED ORDER — HYDROCODONE-HOMATROPINE 5-1.5 MG/5ML PO SYRP: ORAL_SOLUTION | ORAL | Status: DC

## 2014-11-24 NOTE — Progress Notes (Signed)
Pre visit review using our clinic review tool, if applicable. No additional management support is needed unless otherwise documented below in the visit note.  Chief Complaint  Patient presents with  . Cough  . Nasal Congestion  . CHEST CONGESTION    HPI: Patient Kaylee Adams  comes in today for SDA for  new problem evaluation. Almost  2 weeks onset with cough no fever and then lost voice  And getting better  chest and head congestion  Yesterday   some better and then  Perfume  Made worse .  Cough not that bad   Off and on  Some badly  ocass  phlegm.  Worse at  Night.  No face pain.  otc liquid med and allegra  And stopped up.   No cough drops.  ROS: See pertinent positives and negatives per HPI.  Past Medical History  Diagnosis Date  . HLD (hyperlipidemia)   . HTN (hypertension)   . CAD (coronary artery disease)     a. 11/2005 Cath: nonobs dzs, 30% LAD lesion on cath ;  b. 11/2005 Echo: NL EF;  c. 04/2012 Echo: EF 55-60%, Gr 2 DD;  d. 04/2012 Ex MV: EF 72%, ST depression in recovery with NL perfusion imaging - HTN response to exercise.  . Hepatic cyst     on ct [place]  . Midsternal chest pain     cardiology eval with non-ischemic stress test 04/2012    Family History  Problem Relation Age of Onset  . Breast cancer Sister   . Thyroid disease Sister   . Hypertension Mother     History   Social History  . Marital Status: Married    Spouse Name: N/A    Number of Children: 68  . Years of Education: N/A   Occupational History  .     Social History Main Topics  . Smoking status: Never Smoker   . Smokeless tobacco: Never Used  . Alcohol Use: No  . Drug Use: No  . Sexual Activity: Not on file   Other Topics Concern  . Not on file   Social History Narrative   Married, full time Network engineer. 3 pets    Con health   6-8 hours sleep     Outpatient Encounter Prescriptions as of 11/24/2014  Medication Sig  . amLODipine (NORVASC) 5 MG tablet Take 1 tablet (5 mg total) by mouth  daily.  Marland Kitchen losartan-hydrochlorothiazide (HYZAAR) 100-25 MG per tablet TAKE 1 TABLET BY MOUTH EVERY DAY  . HYDROcodone-homatropine (HYCODAN) 5-1.5 MG/5ML syrup 1 tsp at night or every 4-6 hours if needed for cough    EXAM:  BP 144/84 mmHg  Temp(Src) 98.1 F (36.7 C) (Oral)  Ht 5' (1.524 m)  Wt 173 lb 14.4 oz (78.881 kg)  BMI 33.96 kg/m2  Body mass index is 33.96 kg/(m^2). WDWN in NAD  quiet respirations; mildly congested  somewhat hoarse. Non toxic . HEENT: Normocephalic ;atraumatic , Eyes;  PERRL, EOMs  Full, lids and conjunctiva clear,,Ears: no deformities, canals nl, TM landmarks normal, Nose: no deformity or discharge but congested;face minimally tender Mouth : OP clear without lesion or edema . 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  Patient advised to return or notify health care team  if symptoms worsen ,persist or new concerns arise.  Patient Instructions  I think this is the recovery phase of a viral respiratory infection. Airways are still irritable and recovering and can cough can come easier.  Avoid respiratory irritants if possible. Fluids  Warm liquids goo Can try cough med at night for comfort . Expect improvement in the next week  Contact us if relapsing fever etc.      Standley Brooking. Panosh M.D.

## 2014-11-24 NOTE — Patient Instructions (Signed)
I think this is the recovery phase of a viral respiratory infection. Airways are still irritable and recovering and can cough can come easier.  Avoid respiratory irritants if possible. Fluids  Warm liquids goo Can try cough med at night for comfort . Expect improvement in the next week  Contact us if relapsing fever etc.

## 2015-03-28 ENCOUNTER — Ambulatory Visit (INDEPENDENT_AMBULATORY_CARE_PROVIDER_SITE_OTHER): Payer: 59 | Admitting: Internal Medicine

## 2015-03-28 ENCOUNTER — Encounter: Payer: Self-pay | Admitting: Internal Medicine

## 2015-03-28 VITALS — BP 154/76 | HR 88 | Temp 98.3°F | Ht 61.0 in | Wt 171.4 lb

## 2015-03-28 DIAGNOSIS — H9201 Otalgia, right ear: Secondary | ICD-10-CM | POA: Diagnosis not present

## 2015-03-28 DIAGNOSIS — J45998 Other asthma: Secondary | ICD-10-CM

## 2015-03-28 DIAGNOSIS — R490 Dysphonia: Secondary | ICD-10-CM | POA: Diagnosis not present

## 2015-03-28 DIAGNOSIS — R0602 Shortness of breath: Secondary | ICD-10-CM

## 2015-03-28 MED ORDER — BECLOMETHASONE DIPROPIONATE 40 MCG/ACT IN AERS
2.0000 | INHALATION_SPRAY | Freq: Two times a day (BID) | RESPIRATORY_TRACT | Status: DC
Start: 2015-03-28 — End: 2015-04-28

## 2015-03-28 MED ORDER — PREDNISONE 20 MG PO TABS: ORAL_TABLET | ORAL | Status: DC

## 2015-03-28 MED ORDER — ALBUTEROL SULFATE HFA 108 (90 BASE) MCG/ACT IN AERS: 2.0000 | INHALATION_SPRAY | Freq: Four times a day (QID) | RESPIRATORY_TRACT | Status: DC | PRN

## 2015-03-28 NOTE — Progress Notes (Signed)
Pre visit review using our clinic review tool, if applicable. No additional management support is needed unless otherwise documented below in the visit note.  Chief Complaint  Patient presents with  . Shortness of Breath  . Hoarse    HPI: Patient Kaylee Adams  comes in today for SDA for problem evaluation. Feels like last time.  Noted coughing  And such \\and  ear ;like fluid in it . hoarse  ? Test for asthma   Around tobacco smoke   Cough some . No nocturnal .   Ongoing   famikly member age 21 died of asthma attack and thought she should com ein  Right ear pain with flying and noises i  It never right  No vomiting still hoarse at bit waxes and wanes  ROS: See pertinent positives and negatives per HPI.  Past Medical History  Diagnosis Date  . HLD (hyperlipidemia)   . HTN (hypertension)   . CAD (coronary artery disease)     a. 11/2005 Cath: nonobs dzs, 30% LAD lesion on cath ;  b. 11/2005 Echo: NL EF;  c. 04/2012 Echo: EF 55-60%, Gr 2 DD;  d. 04/2012 Ex MV: EF 72%, ST depression in recovery with NL perfusion imaging - HTN response to exercise.  . Hepatic cyst     on ct [place]  . Midsternal chest pain     cardiology eval with non-ischemic stress test 04/2012    Family History  Problem Relation Age of Onset  . Breast cancer Sister   . Thyroid disease Sister   . Hypertension Mother     History   Social History  . Marital Status: Married    Spouse Name: N/A  . Number of Children: 5  . Years of Education: N/A   Occupational History  .     Social History Main Topics  . Smoking status: Never Smoker   . Smokeless tobacco: Never Used  . Alcohol Use: No  . Drug Use: No  . Sexual Activity: Not on file   Other Topics Concern  . None   Social History Narrative   Married, full time Network engineer. 3 pets    Con health   6-8 hours sleep     Outpatient Encounter Prescriptions as of 03/28/2015  Medication Sig  . albuterol (PROVENTIL HFA;VENTOLIN HFA) 108 (90 BASE) MCG/ACT inhaler  Inhale 2 puffs into the lungs every 6 (six) hours as needed for wheezing or shortness of breath.  Marland Kitchen amLODipine (NORVASC) 5 MG tablet Take 1 tablet (5 mg total) by mouth daily.  . beclomethasone (QVAR) 40 MCG/ACT inhaler Inhale 2 puffs into the lungs 2 (two) times daily.  Marland Kitchen losartan-hydrochlorothiazide (HYZAAR) 100-25 MG per tablet TAKE 1 TABLET BY MOUTH EVERY DAY  . predniSONE (DELTASONE) 20 MG tablet Take 3 po qd for 2 days then 2 po qd for 3 days,or as directed  . [DISCONTINUED] HYDROcodone-homatropine (HYCODAN) 5-1.5 MG/5ML syrup 1 tsp at night or every 4-6 hours if needed for cough    EXAM:  BP 154/76 mmHg  Pulse 88  Temp(Src) 98.3 F (36.8 C) (Oral)  Ht 5\' 1"  (1.549 m)  Wt 171 lb 6.4 oz (77.747 kg)  BMI 32.40 kg/m2  SpO2 98%  Body mass index is 32.4 kg/(m^2).  GENERAL: vitals reviewed and listed above, alert, oriented, appears well hydrated and in no acute distress mildly hoarse HEENT: atraumatic, conjunctiva  clear, no obvious abnormalities on inspection of external nose and ears OP : no lesion edema or exudate  NECK:  no obvious masses on inspection palpation  LUNGS: clear to auscultation bilaterally, no wheezes, rales or rhonchi,  CV: HRRR, no clubbing cyanosis or  peripheral edema nl cap refill  MS: moves all extremities without noticeable focal  abnormality PSYCH: pleasant and cooperative, no obvious depression or anxiety  ASSESSMENT AND PLAN:  Discussed the following assessment and plan:  Shortness of breath - poss rad  based on past hx wants to wait on pfts and pulm adivse  pred , incs for amonth  avoid resp irritants  rins out mouth fu 1-2 months  Irritable airways  Ear pain, right - with air travel prob eustachaian tube dysfunction.add INCS  Hoarseness - off an on since last episode  consdier reflux but more likely irrialbe airway GERD  As poss dx dr wert mentioned in past  Can try empiric rx fir rad  -Patient advised to return or notify health care team  if  symptoms worsen ,persist or new concerns arise.  Patient Instructions  This  Could be    Reactive airway    Poss allergic asthmatic sx     Prednisone today to decrease poss allergic  Asthmatic sx   Add nasal cortisone   Every day  For the next month And inhaled cortisone  Every day for control .   Albuterol if needed for wheezing .   Plan fu  In 1-2 months  Or as needed      Mariann Laster K. Panosh M.D.

## 2015-03-28 NOTE — Patient Instructions (Addendum)
This  Could be    Reactive airway    Poss allergic asthmatic sx     Prednisone today to decrease poss allergic  Asthmatic sx   Add nasal cortisone   Every day  For the next month And inhaled cortisone  Every day for control .   Albuterol if needed for wheezing .   Plan fu  In 1-2 months  Or as needed

## 2015-04-09 ENCOUNTER — Emergency Department (INDEPENDENT_AMBULATORY_CARE_PROVIDER_SITE_OTHER): Payer: 59

## 2015-04-09 ENCOUNTER — Emergency Department (INDEPENDENT_AMBULATORY_CARE_PROVIDER_SITE_OTHER)
Admission: EM | Admit: 2015-04-09 | Discharge: 2015-04-09 | Disposition: A | Discharge status: Home / Self Care | Payer: 59 | Source: Home / Self Care | Attending: Emergency Medicine | Admitting: Emergency Medicine

## 2015-04-09 ENCOUNTER — Encounter (HOSPITAL_COMMUNITY): Payer: Self-pay | Admitting: Emergency Medicine

## 2015-04-09 DIAGNOSIS — J40 Bronchitis, not specified as acute or chronic: Secondary | ICD-10-CM | POA: Diagnosis not present

## 2015-04-09 MED ORDER — AZITHROMYCIN 250 MG PO TABS: ORAL_TABLET | ORAL | Status: DC

## 2015-04-09 MED ORDER — PREDNISONE 50 MG PO TABS: ORAL_TABLET | ORAL | Status: DC

## 2015-04-09 MED ORDER — LORATADINE 10 MG PO TABS: 10.0000 mg | ORAL_TABLET | Freq: Every day | ORAL | Status: DC

## 2015-04-09 NOTE — ED Provider Notes (Signed)
CSN: [id number]     Arrival date & time 04/09/15  1129 History   First MD Initiated Contact with Patient 04/09/15 1156     Chief Complaint  Patient presents with  . URI   (Consider location/radiation/quality/duration/timing/severity/associated sxs/prior Treatment) HPI She is a 64 year old woman here for evaluation of cough. She states she has had symptoms for several months. They primarily include cough and wheezing. She has had some mild nasal congestion and intermittent scratchy throat. She saw her doctor 2 weeks ago and was prescribed Qvar, prednisone, and a nasal spray. She has stopped these medications as they did not seem to help at all. 5 days ago, her symptoms seem to get acutely worse. She reports fatigue. She does report some intermittent mild shortness of breath, none currently. No chest pain. She has had some wheezing as well. No fevers. No nausea or vomiting. Her appetite is normal. She has not tried any allergy medications.  Past Medical History  Diagnosis Date  . HLD (hyperlipidemia)   . HTN (hypertension)   . CAD (coronary artery disease)     a. 11/2005 Cath: nonobs dzs, 30% LAD lesion on cath ;  b. 11/2005 Echo: NL EF;  c. 04/2012 Echo: EF 55-60%, Gr 2 DD;  d. 04/2012 Ex MV: EF 72%, ST depression in recovery with NL perfusion imaging - HTN response to exercise.  . Hepatic cyst     on ct [place]  . Midsternal chest pain     cardiology eval with non-ischemic stress test 04/2012   Past Surgical History  Procedure Laterality Date  . Vesicovaginal fistula closure w/ tah      for fibroid tumors  . Breast biopsy    . Cholecystectomy    . Child birth x 5    . Abdominal hysterectomy      fibroid tumors  . Shoulder arthroscopy with rotator cuff repair and subacromial decompression Right 11/03/2013    Procedure: RIGHT SHOULDER ARTHROSCOPY WITH DEBRIDEMENT, ROTATOR CUFF REPAIR AND SUBACROMIAL DECOMPRESSION, DISTAL CLAVICLE RESECTION;  Surgeon: Mcarthur Rossetti, MD;  Location: [facility];  Service: Orthopedics;  Laterality: Right;   Family History  Problem Relation Age of Onset  . Breast cancer Sister   . Thyroid disease Sister   . Hypertension Mother    History  Substance Use Topics  . Smoking status: Never Smoker   . Smokeless tobacco: Never Used  . Alcohol Use: No   OB History    No data available     Review of Systems  Constitutional: Positive for fatigue. Negative for fever, chills and appetite change.  HENT: Positive for congestion (mild) and sore throat (Scratchy). Negative for ear pain, rhinorrhea and trouble swallowing.   Respiratory: Positive for cough, shortness of breath (Mild and intermittent) and wheezing.   Cardiovascular: Negative for chest pain.  Gastrointestinal: Negative for nausea and vomiting.  Musculoskeletal: Negative for myalgias.  Neurological: Negative for headaches.    Allergies  Lisinopril and Penicillins  Home Medications   Prior to Admission medications   Medication Sig Start Date End Date Taking? Authorizing Provider  amLODipine (NORVASC) 5 MG tablet Take 1 tablet (5 mg total) by mouth daily. 07/14/14  Yes Burnis Medin, MD  beclomethasone (QVAR) 40 MCG/ACT inhaler Inhale 2 puffs into the lungs 2 (two) times daily. 03/28/15  Yes Burnis Medin, MD  losartan-hydrochlorothiazide (HYZAAR) 100-25 MG per tablet TAKE 1 TABLET BY MOUTH EVERY DAY 07/14/14  Yes Burnis Medin, MD  albuterol (PROVENTIL HFA;VENTOLIN HFA) 108 (  90 BASE) MCG/ACT inhaler Inhale 2 puffs into the lungs every 6 (six) hours as needed for wheezing or shortness of breath. 03/28/15   Burnis Medin, MD  azithromycin (ZITHROMAX Z-PAK) 250 MG tablet Take 2 pills today, then 1 pill daily until gone. 04/09/15   Melony Overly, MD  loratadine (CLARITIN) 10 MG tablet Take 1 tablet (10 mg total) by mouth daily. 04/09/15   Melony Overly, MD  predniSONE (DELTASONE) 50 MG tablet Take 1 pill daily for 5 days. 04/09/15   Melony Overly, MD   BP 150/65 mmHg  Pulse 80  Temp(Src) 98 F  (36.7 C) (Oral)  Resp 12  SpO2 100% Physical Exam  Constitutional: She is oriented to person, place, and time. She appears well-developed and well-nourished. No distress.  HENT:  Mouth/Throat: Oropharynx is clear and moist. No oropharyngeal exudate.  Mild erythema nasal turbinates with some clear rhinorrhea.  Eyes: Conjunctivae are normal.  Neck: Neck supple.  Cardiovascular: Normal rate, regular rhythm and normal heart sounds.   No murmur heard. Pulmonary/Chest: Effort normal and breath sounds normal. No respiratory distress. She has no wheezes. She has no rales.  Lymphadenopathy:    She has no cervical adenopathy.  Neurological: She is alert and oriented to person, place, and time.    ED Course  Procedures (including critical care time) Labs Review Labs Reviewed - No data to display  Imaging Review Dg Chest 2 View  04/09/2015   CLINICAL DATA:  Ten day history of coughing.  EXAM: CHEST  2 VIEW  COMPARISON:  11/13/2013  FINDINGS: The cardiac silhouette, mediastinal and hilar contours are within normal limits and stable. The lungs are clear of infiltrate or effusion. There is mild peribronchial thickening which could suggest bronchitis. The bony thorax is intact.  IMPRESSION: Mild peribronchial thickening may suggest bronchitis. No infiltrates or effusions.   Electronically Signed   By: Marijo Sanes M.D.   On: 04/09/2015 12:44     MDM   1. Bronchitis    We'll treat with prednisone, azithromycin. Also loratadine for allergy type symptoms. Follow-up as needed.    Melony Overly, MD 04/09/15 [phone]

## 2015-04-09 NOTE — ED Notes (Signed)
C/o cold/allergy sx onset 5 days Seen by PCP on 4/4; given qvar and nasal spray Not getting any better; getting worse Sx today include cough, wheezing, fatigue, chest/nasal congestion Alert, no signs of acute distress.

## 2015-04-09 NOTE — Discharge Instructions (Signed)
You have bronchitis. Take prednisone and azithromycin as prescribed. Take loratadine daily for the next several weeks. Follow-up as needed.

## 2015-04-13 ENCOUNTER — Ambulatory Visit (INDEPENDENT_AMBULATORY_CARE_PROVIDER_SITE_OTHER): Payer: 59 | Admitting: Internal Medicine

## 2015-04-13 ENCOUNTER — Encounter: Payer: Self-pay | Admitting: Internal Medicine

## 2015-04-13 VITALS — BP 150/80 | HR 83 | Temp 98.1°F | Ht 61.0 in | Wt 174.2 lb

## 2015-04-13 DIAGNOSIS — R490 Dysphonia: Secondary | ICD-10-CM

## 2015-04-13 DIAGNOSIS — J40 Bronchitis, not specified as acute or chronic: Secondary | ICD-10-CM

## 2015-04-13 DIAGNOSIS — I10 Essential (primary) hypertension: Secondary | ICD-10-CM

## 2015-04-13 DIAGNOSIS — R946 Abnormal results of thyroid function studies: Secondary | ICD-10-CM

## 2015-04-13 DIAGNOSIS — R0602 Shortness of breath: Secondary | ICD-10-CM | POA: Diagnosis not present

## 2015-04-13 DIAGNOSIS — R7989 Other specified abnormal findings of blood chemistry: Secondary | ICD-10-CM

## 2015-04-13 LAB — CBC WITH DIFFERENTIAL/PLATELET
BASOS ABS: 0 10*3/uL (ref 0.0–0.1)
Basophils Relative: 0.2 % (ref 0.0–3.0)
EOS PCT: 0 % (ref 0.0–5.0)
Eosinophils Absolute: 0 10*3/uL (ref 0.0–0.7)
HEMATOCRIT: 42 % (ref 36.0–46.0)
Hemoglobin: 13.9 g/dL (ref 12.0–15.0)
LYMPHS ABS: 1.6 10*3/uL (ref 0.7–4.0)
Lymphocytes Relative: 15.1 % (ref 12.0–46.0)
MCHC: 33 g/dL (ref 30.0–36.0)
MCV: 78.3 fl (ref 78.0–100.0)
Monocytes Absolute: 0.2 10*3/uL (ref 0.1–1.0)
Monocytes Relative: 1.9 % — ABNORMAL LOW (ref 3.0–12.0)
Neutro Abs: 8.6 10*3/uL — ABNORMAL HIGH (ref 1.4–7.7)
Neutrophils Relative %: 82.8 % — ABNORMAL HIGH (ref 43.0–77.0)
Platelets: 311 10*3/uL (ref 150.0–400.0)
RBC: 5.37 Mil/uL — ABNORMAL HIGH (ref 3.87–5.11)
RDW: 14.4 % (ref 11.5–15.5)
WBC: 10.4 10*3/uL (ref 4.0–10.5)

## 2015-04-13 LAB — T3, FREE: T3, Free: 2.8 pg/mL (ref 2.3–4.2)

## 2015-04-13 LAB — TSH: TSH: 3.1 u[IU]/mL (ref 0.35–4.50)

## 2015-04-13 LAB — T4, FREE: Free T4: 0.83 ng/dL (ref 0.60–1.60)

## 2015-04-13 NOTE — Patient Instructions (Signed)
Will plan referral to get pulmonary consult  About the shortness of breath and dx of poss recurrent bronchitis asthmatic or other.  Thyroid tests and anemia check  Also because   Of abnormal tsh in the past.   Will do a referral after that to endo dr Chalmers Cater  Et al .   Take blood pressure readings twice a day for  10-14  days and then periodically .To ensure below 140/90  On most days   .Send in readings      My chart or other  If continued elevated we may need    adjust your blood pressure medication.

## 2015-04-13 NOTE — Progress Notes (Signed)
Pre visit review using our clinic review tool, if applicable. No additional management support is needed unless otherwise documented below in the visit note.  Chief Complaint  Patient presents with  . Urgent Care Follow Up    HPI: Patient come in for follow up from [facility] 4 16  Visit See last visit and uc visit for resp sx .  rx  With steroid  X 2 and last time with antibiotic z pack   In uc dx as "bronchitis " Had declined pulmonary referral at last visit waiting to see hwo she did with rx   better since went  To ed uc   sometimes still  Tired.   And then sob. ? If anxiety  Also  Sob going up steps some . X ray showed : Mild peribronchial thickening may suggest bronchitis. No infiltrates or effusions nl pulse ox  ROS: See pertinent positives and negatives per HPI. No current cp no hemoptysis  No sneezing itching   stonf odosrs and cig smoke smell at work sometimes  Aggravate sx.   Past Medical History  Diagnosis Date  . HLD (hyperlipidemia)   . HTN (hypertension)   . CAD (coronary artery disease)     a. 11/2005 Cath: nonobs dzs, 30% LAD lesion on cath ;  b. 11/2005 Echo: NL EF;  c. 04/2012 Echo: EF 55-60%, Gr 2 DD;  d. 04/2012 Ex MV: EF 72%, ST depression in recovery with NL perfusion imaging - HTN response to exercise.  . Hepatic cyst     on ct [place]  . Midsternal chest pain     cardiology eval with non-ischemic stress test 04/2012    Family History  Problem Relation Age of Onset  . Breast cancer Sister   . Thyroid disease Sister   . Hypertension Mother     History   Social History  . Marital Status: Married    Spouse Name: N/A  . Number of Children: 5  . Years of Education: N/A   Occupational History  .     Social History Main Topics  . Smoking status: Never Smoker   . Smokeless tobacco: Never Used  . Alcohol Use: No  . Drug Use: No  . Sexual Activity: Not on file   Other Topics Concern  . None   Social History Narrative   Married, full time Network engineer. 3 pets      Con health   6-8 hours sleep     Outpatient Encounter Prescriptions as of 04/13/2015  Medication Sig  . amLODipine (NORVASC) 5 MG tablet Take 1 tablet (5 mg total) by mouth daily.  Marland Kitchen azithromycin (ZITHROMAX Z-PAK) 250 MG tablet Take 2 pills today, then 1 pill daily until gone.  . loratadine (CLARITIN) 10 MG tablet Take 1 tablet (10 mg total) by mouth daily.  Marland Kitchen losartan-hydrochlorothiazide (HYZAAR) 100-25 MG per tablet TAKE 1 TABLET BY MOUTH EVERY DAY  . predniSONE (DELTASONE) 50 MG tablet Take 1 pill daily for 5 days.  Marland Kitchen albuterol (PROVENTIL HFA;VENTOLIN HFA) 108 (90 BASE) MCG/ACT inhaler Inhale 2 puffs into the lungs every 6 (six) hours as needed for wheezing or shortness of breath. (Patient not taking: Reported on 04/13/2015)  . beclomethasone (QVAR) 40 MCG/ACT inhaler Inhale 2 puffs into the lungs 2 (two) times daily. (Patient not taking: Reported on 04/13/2015)    EXAM:  BP 150/80 mmHg  Pulse 83  Temp(Src) 98.1 F (36.7 C) (Oral)  Ht 5\' 1"  (1.549 m)  Wt 174 lb 3.2 oz (79.017  kg)  BMI 32.93 kg/m2  SpO2 97%  Body mass index is 32.93 kg/(m^2).  GENERAL: vitals reviewed and listed above, alert, oriented, appears well hydrated and in no acute distress HEENT: atraumatic, conjunctiva  clear, no obvious abnormalities on inspection of external nose and ears OP : no lesion edema or exudate  Mild hoarse no stridor minimal congestion NECK: no obvious masses on inspection  LUNGS: clear to auscultation bilaterally, no wheezes, rales or rhonchi,  ocass cough dry  CV: HRRR, no clubbing cyanosis or  peripheral edema nl cap refill   Soft short sem lusb not radiating  No g  MS: moves all extremities without noticeable focal  abnormality PSYCH: pleasant and cooperative, no obvious depression or anxiety Lab Results  Component Value Date   WBC 5.6 07/14/2014   HGB 13.1 07/14/2014   HCT 40.0 07/14/2014   PLT 320.0 07/14/2014   GLUCOSE 78 07/14/2014   CHOL 232* 07/14/2014   TRIG 100.0  07/14/2014   HDL 54.60 07/14/2014   LDLDIRECT 153.2 06/23/2012   LDLCALC 157* 07/14/2014   ALT 23 07/14/2014   AST 21 07/14/2014   NA 140 07/14/2014   K 3.6 07/14/2014   CL 105 07/14/2014   CREATININE 0.9 07/14/2014   BUN 18 07/14/2014   CO2 28 07/14/2014   TSH 0.13* 07/14/2014   INR 0.9 11/29/2008   HGBA1C 5.7 02/12/2008    ASSESSMENT AND PLAN:  Discussed the following assessment and plan:  Shortness of breath - recurren see past card  chest exam in past and now what sounds like rad bronchitis  - Plan: CBC with Differential/Platelet, TSH, T4, free, T3, free, Ambulatory referral to Pulmonology  Hoarseness - on going  - Plan: CBC with Differential/Platelet, TSH, T4, free, T3, free, Ambulatory referral to Pulmonology  Low TSH level - last check summer 15 need repeat - Plan: CBC with Differential/Platelet, TSH, T4, free, T3, free, Thyroid antibodies  Wheezy bronchitis - Plan: Ambulatory referral to Pulmonology  Essential hypertension - last few readings elevated check to ensure control fu if not had echo 8938 diastolic dysfunction. ? if related or not  ensure bp control.   -Patient advised to return or notify health care team  if symptoms worsen ,persist or new concerns arise.  Patient Instructions  Will plan referral to get pulmonary consult  About the shortness of breath and dx of poss recurrent bronchitis asthmatic or other.  Thyroid tests and anemia check  Also because   Of abnormal tsh in the past.   Will do a referral after that to endo dr Chalmers Cater  Et al .   Take blood pressure readings twice a day for  10-14  days and then periodically .To ensure below 140/90  On most days   .Send in readings      My chart or other  If continued elevated we may need    adjust your blood pressure medication.    Standley Brooking. Haylie Mccutcheon M.D.

## 2015-04-14 LAB — THYROID ANTIBODIES
Thyroglobulin Ab: <1 IU/mL (ref <2)
Thyroperoxidase Ab SerPl-aCnc: <1 IU/mL (ref <9)

## 2015-04-18 ENCOUNTER — Telehealth: Payer: Self-pay | Admitting: Internal Medicine

## 2015-04-18 NOTE — Telephone Encounter (Signed)
Pt was called back and transferred to triaged

## 2015-04-18 NOTE — Telephone Encounter (Signed)
Spoke to pt, would like refill on antibiotics, still congested, coughing and expectorating greenish yellow no fever. Nasal congestion also. Asked pt if taking anything OTC for cough or congestion? Pt stated no. Told pt can try Mucinex and Robitussin OTC and I will discuss with provider in the morning due to Dr. Regis Bill is out and call you in the morning. Pt verbalized understanding.

## 2015-04-18 NOTE — Telephone Encounter (Signed)
Needs to be triaged.  Cough worsening?  Productive?  Fever?

## 2015-04-18 NOTE — Telephone Encounter (Signed)
Patient Name: Kaylee Adams  DOB: August 11, 1951    Initial Comment Caller states that she would like more antiboitics, is out, and is still congested   Nurse Assessment      Guidelines    Guideline Title Affirmed Question Affirmed Notes       Final Disposition User   FINAL ATTEMPT MADE - no message left [place], Therapist, sports, [employer]

## 2015-04-18 NOTE — Telephone Encounter (Signed)
Pt saw dr Regis Bill on 04/13/15. Pt would like some abx call into [facility]. Pt would like abx for cough. Pt is aware md out of office until wed. Pt decline to see another provider

## 2015-04-19 ENCOUNTER — Other Ambulatory Visit: Payer: Self-pay

## 2015-04-19 DIAGNOSIS — R7989 Other specified abnormal findings of blood chemistry: Secondary | ICD-10-CM

## 2015-04-19 MED ORDER — LEVOFLOXACIN 500 MG PO TABS: 500.0000 mg | ORAL_TABLET | Freq: Every day | ORAL | Status: DC

## 2015-04-19 MED ORDER — BENZONATATE 200 MG PO CAPS: 200.0000 mg | ORAL_CAPSULE | Freq: Three times a day (TID) | ORAL | Status: DC | PRN

## 2015-04-19 NOTE — Telephone Encounter (Signed)
Discussed pt with Tommi Rumps, verbal orders given for Levaquin 500 mg one tablet daily x 5 days and Tessalon capsules every 8 hours PRN.   Called pt told her discussed with our nurse practitioner Tommi Rumps and he said can take Levaquin 500 mg one po x 5 days and Tessalon capsules for cough. Pt verbalized understanding. Rx's sent to pharmacy. Told pt if no better or develop fever to call office. Pt verbalized understanding.

## 2015-04-26 ENCOUNTER — Telehealth: Payer: Self-pay | Admitting: Internal Medicine

## 2015-04-26 NOTE — Telephone Encounter (Signed)
Left a message on home phone for the pt to return my call.

## 2015-04-26 NOTE — Telephone Encounter (Signed)
Pt still have fluid in ears and would like ibuprofen 800 mg call into [facility]. Pt is going on a plane this Sunday.

## 2015-04-26 NOTE — Telephone Encounter (Signed)
See note from 03/28/15

## 2015-04-26 NOTE — Telephone Encounter (Signed)
Ibuprofen doesn't help congestion  But can help pain  If congested  Can try afrin nose spray before flight and  Nasal cortisone  Ibuprofen 800 mg  Disp 30 1 po up to every 8 hours if needed for pain  0 refills

## 2015-04-27 MED ORDER — IBUPROFEN 800 MG PO TABS: 800.0000 mg | ORAL_TABLET | Freq: Three times a day (TID) | ORAL | Status: DC | PRN

## 2015-04-27 NOTE — Telephone Encounter (Signed)
Pt notified of below message and rx sent to the pharmacy by e-scribe.

## 2015-04-28 ENCOUNTER — Ambulatory Visit (INDEPENDENT_AMBULATORY_CARE_PROVIDER_SITE_OTHER): Payer: 59 | Admitting: Internal Medicine

## 2015-04-28 ENCOUNTER — Encounter: Payer: Self-pay | Admitting: Internal Medicine

## 2015-04-28 VITALS — BP 142/80 | HR 97 | Ht 61.0 in | Wt 172.0 lb

## 2015-04-28 DIAGNOSIS — R05 Cough: Secondary | ICD-10-CM

## 2015-04-28 DIAGNOSIS — I10 Essential (primary) hypertension: Secondary | ICD-10-CM

## 2015-04-28 DIAGNOSIS — R058 Other specified cough: Secondary | ICD-10-CM

## 2015-04-28 MED ORDER — PANTOPRAZOLE SODIUM 40 MG PO TBEC: 40.0000 mg | DELAYED_RELEASE_TABLET | Freq: Every day | ORAL | Status: DC

## 2015-04-28 MED ORDER — FAMOTIDINE 20 MG PO TABS: ORAL_TABLET | ORAL | Status: DC

## 2015-04-28 NOTE — Patient Instructions (Signed)
Your problem is basically a raw upper airway  The treatment is:  don't clear your throat - use tessilon pearls 200 mg every 4 hours if needed  Pantoprazole (protonix) 40 mg   Take 30-60 min before first meal of the day and Pepcid 20 mg one bedtime until symptoms completely gone for at least a week, then stop  GERD (REFLUX)  is an extremely common cause of respiratory symptoms, many times with no significant heartburn at all.    It can be treated with medication, but also with lifestyle changes including blocks at the head of the bed,  avoidance of late meals, excessive alcohol, smoking cessation, and avoid fatty foods, chocolate, peppermint, colas, red wine, and acidic juices such as orange juice.  NO MINT OR MENTHOL PRODUCTS SO NO COUGH DROPS  USE SUGARLESS CANDY INSTEAD (jolley ranchers or Stover's)  NO OIL BASED VITAMINS - use powdered substitutes.  You should be fine in a couple of weeks, if not please return to office

## 2015-04-28 NOTE — Progress Notes (Signed)
Subjective:   Patient ID: Kaylee Adams, female    DOB: Oct 20, 1951   MRN: [id number]   Brief patient profile:  46 yobf never smoker never breathing problem until sev days after gen anesthesia for R Rotator cuff on Nov 03 2013 referred to pulmonary clinic by Dr Regis Bill 11/24/2013 for unexplained sob and recurrent uacs since Nov 2015     History of Present Illness  11/24/2013 1st [facility] office visit/ Kaylee Adams cc new onset paroxsyms of sob since surgery 11/03/13  lasting around 30 min but sometimes longer, may help to get up an walk, maybe not, happens avg of 2-3 x per day. Feels may have rawness in throat with sensation needs to clear "congestion" but no excess mucus at all and no assoc nasal symptoms. rec Your problem is basically a raw upper airway The treatment is:  don't clear your throat - use tessilon pearls 200 mg every 4 hours if needed Pantoprazole (protonix) 40 mg   Take 30-60 min before first meal of the day and Pepcid 20 mg one bedtime until symptoms completely gone for at least a week, then stop GERD  Diet      04/28/2015  Consultation Kaylee Adams/ re cough/ sob   Chief Complaint  Patient presents with  . Advice Only    SOB w/activity and with allergies and scents: dry cough; wheezing previously  onset maybe Nov 2015 present every day worse around strong smells / props up most night or not comfortable due to cough    No better with saba/ qvar/ prednisone Mucus was green rx zpak and levaquin but still dry cough>rec tessilon but hasn't taken Sob only with activity / cough  No obvious day to day or daytime variabilty or assoc  cp or chest tightness, subjective wheeze overt sinus or hb symptoms. No unusual exp hx or h/o childhood pna/ asthma or knowledge of premature birth.  Sleeping ok without nocturnal  or early am exacerbation  of respiratory  c/o's or need for noct saba. Also denies any obvious fluctuation of symptoms with weather or environmental changes or other aggravating or  alleviating factors except as outlined above   Current Medications, Allergies, Complete Past Medical History, Past Surgical History, Family History, and Social History were reviewed in [employer] record.  ROS  The following are not active complaints unless bolded sore throat, dysphagia, dental problems, itching, sneezing,  nasal congestion or excess/ purulent secretions, ear ache,   fever, chills, sweats, unintended wt loss, pleuritic or exertional cp, hemoptysis,  orthopnea pnd or leg swelling, presyncope, palpitations, heartburn, abdominal pain, anorexia, nausea, vomiting, diarrhea  or change in bowel or urinary habits, change in stools or urine, dysuria,hematuria,  rash, arthralgias, visual complaints, headache, numbness weakness or ataxia or problems with walking or coordination,  change in mood/affect or memory.              Objective:   Physical Exam   04/28/2015          172  Wt Readings from Last 3 Encounters:  11/24/13 175 lb (79.379 kg)  11/17/13 170 lb (77.111 kg)  11/13/13 171 lb (77.565 kg)     HEENT: nl dentition, turbinates, and orophanx. Nl external ear canals without cough reflex   NECK :  without JVD/Nodes/TM/ nl carotid upstrokes bilaterally   LUNGS: no acc muscle use, clear to A and P bilaterally without cough on insp or exp maneuvers   CV:  RRR  no s3 or murmur or  increase in P2, no edema   ABD:  soft and nontender with nl excursion in the supine position. No bruits or organomegaly, bowel sounds nl  MS:  warm without deformities, calf tenderness, cyanosis or clubbing  SKIN: warm and dry without lesions    NEURO:  alert, approp, no deficits      I personally reviewed images and agree with radiology impression as follows:  CXR:  04/09/15 Mild peribronchial thickening may suggest bronchitis. No infiltrates or effusions.     Assessment & Plan:

## 2015-04-29 ENCOUNTER — Encounter: Payer: Self-pay | Admitting: Internal Medicine

## 2015-04-29 DIAGNOSIS — R05 Cough: Secondary | ICD-10-CM | POA: Insufficient documentation

## 2015-04-29 DIAGNOSIS — R058 Other specified cough: Secondary | ICD-10-CM

## 2015-04-29 HISTORY — DX: Other specified cough: R05.8

## 2015-04-29 NOTE — Assessment & Plan Note (Signed)
The most common causes of chronic cough in immunocompetent adults include the following: upper airway cough syndrome (UACS), previously referred to as postnasal drip syndrome (PNDS), which is caused by variety of rhinosinus conditions; (2) asthma; (3) GERD; (4) chronic bronchitis from cigarette smoking or other inhaled environmental irritants; (5) nonasthmatic eosinophilic bronchitis; and (6) bronchiectasis.   These conditions, singly or in combination, have accounted for up to 94% of the causes of chronic cough in prospective studies.   Other conditions have constituted no >6% of the causes in prospective studies These have included bronchogenic carcinoma, chronic interstitial pneumonia, sarcoidosis, left ventricular failure, ACEI-induced cough, and aspiration from a condition associated with pharyngeal dysfunction.    Chronic cough is often simultaneously caused by more than one condition. A single cause has been found from 38 to 82% of the time, multiple causes from 18 to 62%. Multiply caused cough has been the result of three diseases up to 42% of the time.       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   These are the same pts (now being labeled as having "irritable larynx syndrome" by some cough centers) who not infrequently have a history of having failed to tolerate ace inhibitors,  dry powder inhalers or biphosphonates or report having atypical reflux symptoms that don't respond to standard doses of  PPI , and are easily confused as having aecopd or asthma flares by even experienced allergists/ pulmonologists.   The first step is to maximize acid suppression and eliminate cyclical coughing with tessilon/ hard rock candy then regroup if the cough persists.  See instructions for specific recommendations which were reviewed directly with the patient who was given a copy with highlighter outlining the key components.

## 2015-04-29 NOTE — Assessment & Plan Note (Signed)
Need to keep in mind:  For reasons that may related to vascular permability and nitric oxide pathways but not elevated  bradykinin levels (as seen with  ACEi use) losartan in the generic form has been reported now from mulitple sources  to cause a similar pattern of non-specific  upper airway symptoms as seen with acei.   This has not been reported with exposure to the other ARB's to date, so it seems reasonable for now to try either generic diovan or avapro if ARB needed or use an alternative class altogether.  See:  Lelon Frohlich Allergy Asthma Immunol  2008: 101: p [phone]    Therefore next step if aytypical/refractory symptoms persist needs trial of a different arb or   Bystolic, the most beta -1  selective Beta blocker available in sample form, with bisoprolol the most selective generic choice  on the market.

## 2015-05-18 ENCOUNTER — Telehealth: Payer: Self-pay

## 2015-05-18 NOTE — Telephone Encounter (Signed)
Called and spoke with pt about mammogram and pt states that she usually gets on at Dr. Meribeth Mattes office. Pt states she will schedule one soon.

## 2015-05-30 ENCOUNTER — Other Ambulatory Visit: Payer: Self-pay | Admitting: Endocrinology

## 2015-05-30 DIAGNOSIS — E049 Nontoxic goiter, unspecified: Secondary | ICD-10-CM

## 2015-06-09 ENCOUNTER — Encounter: Payer: Self-pay | Admitting: Internal Medicine

## 2015-06-09 ENCOUNTER — Ambulatory Visit (INDEPENDENT_AMBULATORY_CARE_PROVIDER_SITE_OTHER): Payer: 59 | Admitting: Internal Medicine

## 2015-06-09 VITALS — BP 142/78 | Temp 98.3°F | Ht 61.0 in | Wt 172.3 lb

## 2015-06-09 DIAGNOSIS — I10 Essential (primary) hypertension: Secondary | ICD-10-CM

## 2015-06-09 DIAGNOSIS — M542 Cervicalgia: Secondary | ICD-10-CM

## 2015-06-09 LAB — BASIC METABOLIC PANEL
BUN: 18 mg/dL (ref 6–23)
CALCIUM: 10.1 mg/dL (ref 8.4–10.5)
CO2: 29 meq/L (ref 19–32)
CREATININE: 0.77 mg/dL (ref 0.40–1.20)
Chloride: 105 mEq/L (ref 96–112)
GFR: 97.24 mL/min (ref >60.00)
GLUCOSE: 95 mg/dL (ref 70–99)
Potassium: 4 mEq/L (ref 3.5–5.1)
SODIUM: 139 meq/L (ref 135–145)

## 2015-06-09 NOTE — Progress Notes (Signed)
Pre visit review using our clinic review tool, if applicable. No additional management support is needed unless otherwise documented below in the visit note.  Chief Complaint  Patient presents with  . Follow-up    HPI: Kaylee Adams 64 y.o. comes in today for follow-up of hypertension after consult with endocrinologist about her thyroid and Dr. Melvyn Novas respiratory for prolonged cough and upper airway symptoms. Her respiratory symptoms are gone has no prolonged cough. She has her blood pressure readings reported on my chart the majority are at goal. She still has some funny discomfort on her right anterior neck and right ear area. Has had her jaw gets stuck at times. Denies any obvious nocturnal gritting but does sleep in a position that could be aggravating her neck. Respiratory shortness of breath about the same no worse. No new symptoms otherwise. Dr. Chalmers Cater  her thyroid tests were okay and should be checked yearly. .  ROS: See pertinent positives and negatives per HPI.  Past Medical History  Diagnosis Date  . HLD (hyperlipidemia)   . HTN (hypertension)   . CAD (coronary artery disease)     a. 11/2005 Cath: nonobs dzs, 30% LAD lesion on cath ;  b. 11/2005 Echo: NL EF;  c. 04/2012 Echo: EF 55-60%, Gr 2 DD;  d. 04/2012 Ex MV: EF 72%, ST depression in recovery with NL perfusion imaging - HTN response to exercise.  . Hepatic cyst     on ct [place]  . Midsternal chest pain     cardiology eval with non-ischemic stress test 04/2012    Family History  Problem Relation Age of Onset  . Breast cancer Sister   . Thyroid disease Sister   . Hypertension Mother     History   Social History  . Marital Status: Married    Spouse Name: N/A  . Number of Children: 5  . Years of Education: N/A   Occupational History  .     Social History Main Topics  . Smoking status: Never Smoker   . Smokeless tobacco: Never Used  . Alcohol Use: No  . Drug Use: No  . Sexual Activity: Not on file   Other Topics  Concern  . None   Social History Narrative   Married, full time Network engineer. 3 pets    Con health   6-8 hours sleep     Outpatient Prescriptions Prior to Visit  Medication Sig Dispense Refill  . amLODipine (NORVASC) 5 MG tablet Take 1 tablet (5 mg total) by mouth daily. 90 tablet 3  . loratadine (CLARITIN) 10 MG tablet Take 1 tablet (10 mg total) by mouth daily. 30 tablet 0  . losartan-hydrochlorothiazide (HYZAAR) 100-25 MG per tablet TAKE 1 TABLET BY MOUTH EVERY DAY 90 tablet 3  . benzonatate (TESSALON) 200 MG capsule Take 1 capsule (200 mg total) by mouth 3 (three) times daily as needed for cough. (Patient not taking: Reported on 04/28/2015) 20 capsule 0  . famotidine (PEPCID) 20 MG tablet One at bedtime (Patient not taking: Reported on 06/09/2015) 30 tablet 2  . ibuprofen (ADVIL,MOTRIN) 800 MG tablet Take 1 tablet (800 mg total) by mouth every 8 (eight) hours as needed. (Patient not taking: Reported on 06/09/2015) 30 tablet 0  . pantoprazole (PROTONIX) 40 MG tablet Take 1 tablet (40 mg total) by mouth daily. Take 30-60 min before first meal of the day (Patient not taking: Reported on 06/09/2015) 30 tablet 2   No facility-administered medications prior to visit.  EXAM:  BP 142/78 mmHg  Temp(Src) 98.3 F (36.8 C) (Oral)  Ht 5\' 1"  (1.549 m)  Wt 172 lb 4.8 oz (78.155 kg)  BMI 32.57 kg/m2  Body mass index is 32.57 kg/(m^2).  GENERAL: vitals reviewed and listed above, alert, oriented, appears well hydrated and in no acute distress HEENT: atraumatic, conjunctiva  clear, no obvious abnormalities on inspection of external nose and ears OP : no lesion edema or exudate  has some clicking of both jaw joints. Has a little angle when she opens her mouth.  NECK: no obvious masses on inspection palpation  thyroid palpable nontender.  LUNGS: clear to auscultation bilaterally, no wheezes, rales or rhonchi,  CV: HRRR, no clubbing cyanosis or  peripheral edema nl cap refill  MS: moves all  extremities without noticeable focal  abnormality PSYCH: pleasant and cooperative, no obvious depression or anxiety BP Readings from Last 3 Encounters:  06/09/15 142/78  04/28/15 142/80  04/13/15 150/80   Wt Readings from Last 3 Encounters:  06/09/15 172 lb 4.8 oz (78.155 kg)  04/28/15 172 lb (78.019 kg)  04/13/15 174 lb 3.2 oz (79.017 kg)     ASSESSMENT AND PLAN:  Discussed the following assessment and plan:  Essential hypertension - Home readings at goal continue [place] or monthly readings to ensure at goal [place] symptoms resolved could change to another ARB if recurring. - Plan: Basic metabolic panel  Neck discomfort - Seems to be near the right SCM and mechanical probably also has some TMJ adding to symptoms reassuring expectant management. Local measures.  expectant management no alarm symptoms at present plan wellness visit in January or as needed or any progression of symptoms that she can't explain. Workup is been reassuring.  -Patient advised to return or notify health care team  if symptoms worsen ,persist or new concerns arise.  Patient Instructions  Continue  Same medication   Check bp readings  Every month for a few days and if ok then  [facility]. Goal lessthan 140/90  Think the  Neck and jaw pain  Discomfort are realted  Could have teeth gritting at night or stress on the joint  . No serious findings are reassuring.  Looks at neck posture during day and night.  Will notify you  of labs when available.   Wellness visit in Jan 2017  ora s needed      Standley Brooking. Panosh M.D.  Lab Results  Component Value Date   WBC 10.4 04/13/2015   HGB 13.9 04/13/2015   HCT 42.0 04/13/2015   PLT 311.0 04/13/2015   GLUCOSE 78 07/14/2014   CHOL 232* 07/14/2014   TRIG 100.0 07/14/2014   HDL 54.60 07/14/2014   LDLDIRECT 153.2 06/23/2012   LDLCALC 157* 07/14/2014   ALT 23 07/14/2014   AST 21 07/14/2014   NA 140 07/14/2014   K 3.6 07/14/2014   CL 105 07/14/2014    CREATININE 0.9 07/14/2014   BUN 18 07/14/2014   CO2 28 07/14/2014   TSH 3.10 04/13/2015   INR 0.9 11/29/2008   HGBA1C 5.7 02/12/2008

## 2015-06-09 NOTE — Patient Instructions (Signed)
Continue  Same medication   Check bp readings  Every month for a few days and if ok then  [facility]. Goal lessthan 140/90  Think the  Neck and jaw pain  Discomfort are realted  Could have teeth gritting at night or stress on the joint  . No serious findings are reassuring.  Looks at neck posture during day and night.  Will notify you  of labs when available.   Wellness visit in Jan 2017  ora s needed

## 2015-07-15 ENCOUNTER — Encounter: Payer: Self-pay | Admitting: *Deleted

## 2015-08-09 ENCOUNTER — Encounter: Payer: Self-pay | Admitting: Gastroenterology

## 2015-08-26 ENCOUNTER — Telehealth: Payer: Self-pay | Admitting: Internal Medicine

## 2015-08-26 MED ORDER — LOSARTAN POTASSIUM-HCTZ 100-25 MG PO TABS: ORAL_TABLET | ORAL | Status: DC

## 2015-08-26 MED ORDER — AMLODIPINE BESYLATE 5 MG PO TABS: 5.0000 mg | ORAL_TABLET | Freq: Every day | ORAL | Status: DC

## 2015-08-26 NOTE — Telephone Encounter (Signed)
Sent to the pharmacy by e-scribe.  Pt has upcoming CPX on 01/02/16

## 2015-08-26 NOTE — Telephone Encounter (Signed)
Pt needs refill on amlodipine 5 mg and losartan-hctz 100-25 mg #90 each w/refills send to [place] ou pt pharm.

## 2015-12-03 ENCOUNTER — Encounter (HOSPITAL_COMMUNITY): Payer: Self-pay | Admitting: Emergency Medicine

## 2015-12-03 ENCOUNTER — Emergency Department (INDEPENDENT_AMBULATORY_CARE_PROVIDER_SITE_OTHER)
Admission: EM | Admit: 2015-12-03 | Discharge: 2015-12-03 | Disposition: A | Discharge status: Home / Self Care | Payer: 59 | Source: Home / Self Care | Attending: Family Medicine | Admitting: Family Medicine

## 2015-12-03 DIAGNOSIS — I10 Essential (primary) hypertension: Secondary | ICD-10-CM

## 2015-12-03 DIAGNOSIS — R51 Headache: Secondary | ICD-10-CM | POA: Diagnosis not present

## 2015-12-03 DIAGNOSIS — R140 Abdominal distension (gaseous): Secondary | ICD-10-CM | POA: Diagnosis not present

## 2015-12-03 DIAGNOSIS — R519 Headache, unspecified: Secondary | ICD-10-CM

## 2015-12-03 MED ORDER — NAPROXEN 500 MG PO TABS: 500.0000 mg | ORAL_TABLET | Freq: Two times a day (BID) | ORAL | Status: DC

## 2015-12-03 NOTE — ED Provider Notes (Signed)
CSN: [id number]     Arrival date & time 12/03/15  1730 History   First MD Initiated Contact with Patient 12/03/15 1856     Chief Complaint  Patient presents with  . Headache   (Consider location/radiation/quality/duration/timing/severity/associated sxs/prior Treatment) Patient is a 64 y.o. female presenting with headaches, abdominal pain, and hypertension. The history is provided by the patient. No language interpreter was used.  Headache Pain location:  Occipital (Left occipital) Quality:  Dull Radiates to:  Does not radiate Severity currently:  4/10 (Right now it is about 2/10 in severity) Duration:  2 weeks Timing:  Intermittent Progression:  Unchanged Chronicity:  New Similar to prior headaches: no   Context: caffeine and emotional stress   Context: not activity   Context comment:  She drinks one cup of coffee daily Relieved by:  NSAIDs Worsened by:  Nothing Ineffective treatments:  NSAIDs Associated symptoms: abdominal pain   Associated symptoms: no blurred vision, no dizziness, no eye pain, no facial pain, no fatigue, no fever, no loss of balance, no nausea, no numbness, no photophobia, no seizures, no vomiting and no weakness   Abdominal Pain Pain location:  Generalized Pain quality: bloating   Pain quality: not cramping   Pain quality comment:  Some discomfort and not pain Pain radiates to:  Does not radiate Pain severity:  Moderate Onset quality:  Gradual Timing:  Intermittent Progression:  Unchanged Chronicity:  New Context: not diet changes, not eating, not laxative use, not recent travel and not suspicious food intake   Relieved by:  NSAIDs Associated symptoms: no anorexia, no chest pain, no fatigue, no fever, no nausea, no shortness of breath and no vomiting   Hypertension This is a chronic problem. Associated symptoms include abdominal pain and headaches. Pertinent negatives include no chest pain and no shortness of breath. The symptoms are aggravated by  stress.  She takes her medication everyday. She was recently seen by PCP few months ago and her BP was slightly elevated as well.  Past Medical History  Diagnosis Date  . HLD (hyperlipidemia)   . HTN (hypertension)   . CAD (coronary artery disease)     a. 11/2005 Cath: nonobs dzs, 30% LAD lesion on cath ;  b. 11/2005 Echo: NL EF;  c. 04/2012 Echo: EF 55-60%, Gr 2 DD;  d. 04/2012 Ex MV: EF 72%, ST depression in recovery with NL perfusion imaging - HTN response to exercise.  . Hepatic cyst     on ct [place]  . Midsternal chest pain     cardiology eval with non-ischemic stress test 04/2012   Past Surgical History  Procedure Laterality Date  . Vesicovaginal fistula closure w/ tah      for fibroid tumors  . Breast biopsy    . Cholecystectomy    . Child birth x 5    . Abdominal hysterectomy      fibroid tumors  . Shoulder arthroscopy with rotator cuff repair and subacromial decompression Right 11/03/2013    Procedure: RIGHT SHOULDER ARTHROSCOPY WITH DEBRIDEMENT, ROTATOR CUFF REPAIR AND SUBACROMIAL DECOMPRESSION, DISTAL CLAVICLE RESECTION;  Surgeon: Mcarthur Rossetti, MD;  Location: [facility];  Service: Orthopedics;  Laterality: Right;   Family History  Problem Relation Age of Onset  . Breast cancer Sister   . Thyroid disease Sister   . Hypertension Mother    Social History  Substance Use Topics  . Smoking status: Never Smoker   . Smokeless tobacco: Never Used  . Alcohol Use: No   OB History  No data available     Review of Systems  Constitutional: Negative for fever and fatigue.  Eyes: Negative for blurred vision, photophobia and pain.  Respiratory: Negative for shortness of breath.   Cardiovascular: Negative for chest pain.  Gastrointestinal: Positive for abdominal pain. Negative for nausea, vomiting and anorexia.  Genitourinary: Negative.   Neurological: Positive for headaches. Negative for dizziness, tremors, seizures, syncope, weakness, numbness and loss of balance.  All  other systems reviewed and are negative.   Allergies  Lisinopril and Penicillins  Home Medications   Prior to Admission medications   Medication Sig Start Date End Date Taking? Authorizing Provider  amLODipine (NORVASC) 5 MG tablet Take 1 tablet (5 mg total) by mouth daily. 08/26/15  Yes Burnis Medin, MD  ibuprofen (ADVIL,MOTRIN) 800 MG tablet Take 1 tablet (800 mg total) by mouth every 8 (eight) hours as needed. 04/27/15  Yes Burnis Medin, MD  losartan-hydrochlorothiazide (HYZAAR) 100-25 MG per tablet TAKE 1 TABLET BY MOUTH EVERY DAY 08/26/15  Yes Burnis Medin, MD  benzonatate (TESSALON) 200 MG capsule Take 1 capsule (200 mg total) by mouth 3 (three) times daily as needed for cough. Patient not taking: Reported on 04/28/2015 04/19/15   Dorothyann Peng, NP  famotidine (PEPCID) 20 MG tablet One at bedtime Patient not taking: Reported on 06/09/2015 04/28/15   Tanda Rockers, MD  loratadine (CLARITIN) 10 MG tablet Take 1 tablet (10 mg total) by mouth daily. 04/09/15   Melony Overly, MD  pantoprazole (PROTONIX) 40 MG tablet Take 1 tablet (40 mg total) by mouth daily. Take 30-60 min before first meal of the day Patient not taking: Reported on 06/09/2015 04/28/15   Tanda Rockers, MD   Meds Ordered and Administered this Visit  Medications - No data to display  BP 173/76 mmHg  Pulse 92  Temp(Src) 98 F (36.7 C) (Oral)  SpO2 100% No data found. repeat V/S: BP: 152/64  HR:87  Physical Exam  Constitutional: She is oriented to person, place, and time. She appears well-developed. No distress.  Cardiovascular: Normal rate, regular rhythm and normal heart sounds.   No murmur heard. Pulmonary/Chest: Effort normal and breath sounds normal. No respiratory distress. She has no wheezes.  Abdominal: Soft. Bowel sounds are normal. She exhibits no distension and no mass. There is no tenderness. There is no rebound.  Musculoskeletal: Normal range of motion. She exhibits no edema.  Neurological: She is alert and  oriented to person, place, and time. She has normal strength and normal reflexes. No cranial nerve deficit or sensory deficit. She displays a negative Romberg sign. She displays no Babinski's sign on the right side. She displays no Babinski's sign on the left side.  Nursing note and vitals reviewed.   ED Course  Procedures (including critical care time)  Labs Review Labs Reviewed - No data to display  Imaging Review No results found.   Visual Acuity Review  Right Eye Distance:   Left Eye Distance:   Bilateral Distance:    Right Eye Near:   Left Eye Near:    Bilateral Near:         MDM  No diagnosis found. Unilateral occipital headache  Abdominal bloating  Essential hypertension  No neurologic deficit, no signs of meningeal irritation. Headache likely stress related. Naproxen prescribed prn pain. I discussed red flag symptoms and reasons for ED visit.  I recommended high fiber diet and Gas-x for bloating.  Recheck BP improved a lot. F/U with PCP for  reassement and management.    Kinnie Feil, MD 12/03/15 [phone]

## 2015-12-03 NOTE — ED Notes (Signed)
Pt c/o constant HA onset 2 weeks associated w/abd pain/pressure and fatigue A&O x4... No acute distress.

## 2015-12-03 NOTE — Discharge Instructions (Signed)
It was nice seeing you today. I am sorry about your headache. The good thing is you don't have any neurologic deficit. This could be stress related headache. However, if it persist with new onset of nausea or vomiting and change in vision please go to the hospital. Obtain Gas-X over the counter for your belly symptoms and increase fiber diet. F/U with your PCP soon for your BP.

## 2015-12-05 ENCOUNTER — Telehealth: Payer: Self-pay | Admitting: Internal Medicine

## 2015-12-05 NOTE — Telephone Encounter (Signed)
Patient Name: Kaylee Adams  DOB: [birth year]    Initial Comment Caller states she has been having headaches for last three weeks, went to [facility] Sat, referred back to pcp   Nurse Assessment  Nurse: Raphael Gibney, RN, Vanita Ingles Date/Time (Eastern Time): 12/05/2015 10:19:13 AM  Confirm and document reason for call. If symptomatic, describe symptoms. ---Caller states she went to the urgent care for headaches she has been having for 3 weeks. Urgent care did not find anything and referred her back to her PCP. Headache is constant. BP was elevated at the urgent care BP 135/75 yesterday.  Has the patient traveled out of the country within the last 30 days? ---Not Applicable  Does the patient have any new or worsening symptoms? ---Yes  Will a triage be completed? ---Yes  Related visit to physician within the last 2 weeks? ---Yes  Does the PT have any chronic conditions? (i.e. diabetes, asthma, etc.) ---Yes  List chronic conditions. ---HTN  Is this a behavioral health or substance abuse call? ---No     Guidelines    Guideline Title Affirmed Question Affirmed Notes  Headache [1] MILD-MODERATE headache AND [2] present > 72 hours    Final Disposition User   See PCP When Office is Open (within 3 days) Raphael Gibney, Therapist, sports, Vanita Ingles    Comments  No appts available today or tomorrow with Dr. Shanon Ace and she is off the rest of the week. Pt does not want to schedule with another doctor at [facility] or go to another office. She would like to be placed on the cancellation list.   Disagree/Comply: Comply

## 2015-12-05 NOTE — Telephone Encounter (Signed)
Please advise where to see the pt.

## 2015-12-05 NOTE — Telephone Encounter (Signed)
Please advise 

## 2015-12-05 NOTE — Telephone Encounter (Signed)
Pt scheduled for 12/06/15 @ 3:45 PM.  Notified by telephone.

## 2015-12-05 NOTE — Telephone Encounter (Signed)
Would have to work her in tomorrow at 32  Pm for single problem acute .   Please  Have her Bring in her BP  readings .

## 2015-12-06 ENCOUNTER — Ambulatory Visit (INDEPENDENT_AMBULATORY_CARE_PROVIDER_SITE_OTHER): Payer: 59 | Admitting: Internal Medicine

## 2015-12-06 ENCOUNTER — Encounter: Payer: Self-pay | Admitting: Internal Medicine

## 2015-12-06 VITALS — BP 158/80 | Temp 98.3°F | Wt 174.5 lb

## 2015-12-06 DIAGNOSIS — R51 Headache: Secondary | ICD-10-CM

## 2015-12-06 DIAGNOSIS — Z658 Other specified problems related to psychosocial circumstances: Secondary | ICD-10-CM

## 2015-12-06 DIAGNOSIS — R519 Headache, unspecified: Secondary | ICD-10-CM

## 2015-12-06 DIAGNOSIS — F439 Reaction to severe stress, unspecified: Secondary | ICD-10-CM

## 2015-12-06 DIAGNOSIS — I10 Essential (primary) hypertension: Secondary | ICD-10-CM

## 2015-12-06 MED ORDER — CYCLOBENZAPRINE HCL 5 MG PO TABS: 5.0000 mg | ORAL_TABLET | Freq: Every day | ORAL | Status: DC

## 2015-12-06 NOTE — Progress Notes (Signed)
Pre visit review using our clinic review tool, if applicable. No additional management support is needed unless otherwise documented below in the visit note.  Chief Complaint  Patient presents with  . Headache    bp up    HPI: Patient Kaylee Adams  comes in today for SDA for  new problem evaluation [facility] new onset occipital ha    No rx offered To f u with pcp   On going  For 3 weeks   Insidious onset left occipital ha not to touch  Current 99991111 continuous Uncertain  cause .      Ibuprofen  Concern to go up.      Stopped  recenetly and still iup bp .   Sleeping ok not woken up at night .  Better this am  noteiced and the inc when u pa and around   Continual  Not pulsating  No stabbing .    No nec pain uncertain  No injury neru sx vision hearing  . Very stressful at work   caffien once in am no hx of sig has  Or this   Neck some stiff non specific .  ROS: See pertinent positives and negatives per HPI.  Past Medical History  Diagnosis Date  . HLD (hyperlipidemia)   . HTN (hypertension)   . CAD (coronary artery disease)     a. 11/2005 Cath: nonobs dzs, 30% LAD lesion on cath ;  b. 11/2005 Echo: NL EF;  c. 04/2012 Echo: EF 55-60%, Gr 2 DD;  d. 04/2012 Ex MV: EF 72%, ST depression in recovery with NL perfusion imaging - HTN response to exercise.  . Hepatic cyst     on ct [place]  . Midsternal chest pain     cardiology eval with non-ischemic stress test 04/2012    Family History  Problem Relation Age of Onset  . Breast cancer Sister   . Thyroid disease Sister   . Hypertension Mother     Social History   Social History  . Marital Status: Married    Spouse Name: N/A  . Number of Children: 5  . Years of Education: N/A   Occupational History  .     Social History Main Topics  . Smoking status: Never Smoker   . Smokeless tobacco: Never Used  . Alcohol Use: No  . Drug Use: No  . Sexual Activity: Not Asked   Other Topics Concern  . None   Social History  Narrative   Married, full time Network engineer. 3 pets    Con health   6-8 hours sleep     Outpatient Prescriptions Prior to Visit  Medication Sig Dispense Refill  . amLODipine (NORVASC) 5 MG tablet Take 1 tablet (5 mg total) by mouth daily. 90 tablet 1  . losartan-hydrochlorothiazide (HYZAAR) 100-25 MG per tablet TAKE 1 TABLET BY MOUTH EVERY DAY 90 tablet 1  . ibuprofen (ADVIL,MOTRIN) 800 MG tablet Take 1 tablet (800 mg total) by mouth every 8 (eight) hours as needed. 30 tablet 0  . benzonatate (TESSALON) 200 MG capsule Take 1 capsule (200 mg total) by mouth 3 (three) times daily as needed for cough. (Patient not taking: Reported on 04/28/2015) 20 capsule 0  . famotidine (PEPCID) 20 MG tablet One at bedtime (Patient not taking: Reported on 06/09/2015) 30 tablet 2  . loratadine (CLARITIN) 10 MG tablet Take 1 tablet (10 mg total) by mouth daily. 30 tablet 0  . naproxen (NAPROSYN) 500  MG tablet Take 1 tablet (500 mg total) by mouth 2 (two) times daily. 30 tablet 0  . pantoprazole (PROTONIX) 40 MG tablet Take 1 tablet (40 mg total) by mouth daily. Take 30-60 min before first meal of the day (Patient not taking: Reported on 06/09/2015) 30 tablet 2   No facility-administered medications prior to visit.     EXAM:  BP 158/80 mmHg  Temp(Src) 98.3 F (36.8 C) (Oral)  Wt 174 lb 8 oz (79.153 kg)  Body mass index is 32.99 kg/(m^2).  GENERAL: vitals reviewed and listed above, alert, oriented, appears well hydrated and in no acute distress no toxic  In nad HEENT: atraumatic, conjunctiva  clear, no obvious abnormalities on inspection of external nose and ears OP : no lesion edema or exudate  NECK: no obvious masses on inspection palpation  ? Spasm  Points to right occiput as area of pain   LUNGS: clear to auscultation bilaterally, no wheezes, rales or rhonchi, CV: HRRR, no clubbing cyanosis or  peripheral edema nl cap refill  MS: moves all extremities without noticeable focal  abnormality PSYCH: pleasant  and cooperative, no obvious depression or anxiety NEURO: oriented x 3 CN 3-12 appear intact. No focal muscle weakness or atrophy. DTRs symmetrical. Gait WNL.  Grossly non focal. No tremor or abnormal movement. Neg rhomberg  Finger to nose  BP Readings from Last 3 Encounters:  12/06/15 158/80  12/03/15 173/76  06/09/15 142/78    ASSESSMENT AND PLAN:  Discussed the following assessment and plan:  Unilateral occipital headache - no other alarm sx except new and mild  reassuring exam rx flexeril hs stress red if persistent consider mri or neuro consults  Essential hypertension - now up poss from nsaids she stopped dont thin k cause but effect . monitor stay on meds was 135 2 days ago weekend! Disc poss causes   Conservative trial and if  Ongoing more eval   Fu with [place] if not subsideing or new sx of concern. Fever etc -Patient advised to return or notify health care team  if symptoms worsen ,persist or new concerns arise.  Patient Instructions  Repeat  bp  158/9870 agree   Holding the  ibuprofen since not helping  Can try tylenol   Add muscle relaxant at night   Incase tension  Or headache related to neck muscles nerves .   If not better in another week or so contcat [place] of  Earlier if progressing  Yes    stress can aggravated headaches .  Contact us if p readings not coming down after a few days .   Stress can trigger a headache ... Also   .      Standley Brooking. Panosh M.D.

## 2015-12-06 NOTE — Patient Instructions (Addendum)
Repeat  bp  158/9870 agree   Holding the  ibuprofen since not helping  Can try tylenol   Add muscle relaxant at night   Incase tension  Or headache related to neck muscles nerves .   If not better in another week or so contcat [place] of  Earlier if progressing  Yes    stress can aggravated headaches .  Contact us if p readings not coming down after a few days .   Stress can trigger a headache ... Also   .

## 2015-12-28 DIAGNOSIS — M7751 Other enthesopathy of right foot: Secondary | ICD-10-CM | POA: Diagnosis not present

## 2015-12-28 MED FILL — METHYLPREDNISOLONE 4 MG TAB: 4 | 6 days supply | Qty: 21 | Fill #0

## 2015-12-28 MED FILL — DICLOFENAC SODIUM 1% GEL: 1 | 13 days supply | Qty: 100 | Fill #0

## 2015-12-30 ENCOUNTER — Other Ambulatory Visit (INDEPENDENT_AMBULATORY_CARE_PROVIDER_SITE_OTHER): Payer: 59

## 2015-12-30 DIAGNOSIS — Z0000 Encounter for general adult medical examination without abnormal findings: Secondary | ICD-10-CM

## 2015-12-30 LAB — HEPATIC FUNCTION PANEL
ALT: 21 U/L (ref 0–35)
AST: 17 U/L (ref 0–37)
Albumin: 4.4 g/dL (ref 3.5–5.2)
Alkaline Phosphatase: 63 U/L (ref 39–117)
BILIRUBIN DIRECT: 0.1 mg/dL (ref 0.0–0.3)
TOTAL PROTEIN: 7.5 g/dL (ref 6.0–8.3)
Total Bilirubin: 0.3 mg/dL (ref 0.2–1.2)

## 2015-12-30 LAB — CBC WITH DIFFERENTIAL/PLATELET
BASOS ABS: 0 10*3/uL (ref 0.0–0.1)
BASOS PCT: 0.3 % (ref 0.0–3.0)
EOS ABS: 0 10*3/uL (ref 0.0–0.7)
Eosinophils Relative: 0 % (ref 0.0–5.0)
HEMATOCRIT: 42.1 % (ref 36.0–46.0)
Hemoglobin: 13.8 g/dL (ref 12.0–15.0)
Lymphocytes Relative: 10.9 % — ABNORMAL LOW (ref 12.0–46.0)
Lymphs Abs: 1.4 10*3/uL (ref 0.7–4.0)
MCHC: 32.8 g/dL (ref 30.0–36.0)
MCV: 79.2 fl (ref 78.0–100.0)
MONO ABS: 0.4 10*3/uL (ref 0.1–1.0)
Monocytes Relative: 3.2 % (ref 3.0–12.0)
NEUTROS ABS: 10.7 10*3/uL — AB (ref 1.4–7.7)
Neutrophils Relative %: 85.6 % — ABNORMAL HIGH (ref 43.0–77.0)
Platelets: 350 10*3/uL (ref 150.0–400.0)
RBC: 5.32 Mil/uL — ABNORMAL HIGH (ref 3.87–5.11)
RDW: 13.7 % (ref 11.5–15.5)
WBC: 12.6 10*3/uL — ABNORMAL HIGH (ref 4.0–10.5)

## 2015-12-30 LAB — BASIC METABOLIC PANEL
BUN: 18 mg/dL (ref 6–23)
CALCIUM: 10.2 mg/dL (ref 8.4–10.5)
CHLORIDE: 108 meq/L (ref 96–112)
CO2: 27 mEq/L (ref 19–32)
CREATININE: 0.74 mg/dL (ref 0.40–1.20)
GFR: 101.62 mL/min (ref >60.00)
Glucose, Bld: 106 mg/dL — ABNORMAL HIGH (ref 70–99)
Potassium: 3.7 mEq/L (ref 3.5–5.1)
Sodium: 142 mEq/L (ref 135–145)

## 2015-12-30 LAB — LIPID PANEL
CHOL/HDL RATIO: 4
Cholesterol: 263 mg/dL — ABNORMAL HIGH (ref 0–200)
HDL: 65.6 mg/dL (ref >39.00)
LDL Cholesterol: 186 mg/dL — ABNORMAL HIGH (ref 0–99)
NonHDL: 197.74
Triglycerides: 60 mg/dL (ref 0.0–149.0)
VLDL: 12 mg/dL (ref 0.0–40.0)

## 2015-12-30 LAB — TSH: TSH: 0.54 u[IU]/mL (ref 0.35–4.50)

## 2016-01-06 ENCOUNTER — Encounter: Payer: Self-pay | Admitting: Internal Medicine

## 2016-01-06 ENCOUNTER — Ambulatory Visit (INDEPENDENT_AMBULATORY_CARE_PROVIDER_SITE_OTHER): Payer: 59 | Admitting: Internal Medicine

## 2016-01-06 VITALS — BP 132/76 | Temp 97.9°F | Ht 60.0 in | Wt 173.0 lb

## 2016-01-06 DIAGNOSIS — Z114 Encounter for screening for human immunodeficiency virus [HIV]: Secondary | ICD-10-CM

## 2016-01-06 DIAGNOSIS — Z1159 Encounter for screening for other viral diseases: Secondary | ICD-10-CM

## 2016-01-06 DIAGNOSIS — Z0000 Encounter for general adult medical examination without abnormal findings: Secondary | ICD-10-CM | POA: Diagnosis not present

## 2016-01-06 DIAGNOSIS — E785 Hyperlipidemia, unspecified: Secondary | ICD-10-CM | POA: Diagnosis not present

## 2016-01-06 DIAGNOSIS — I10 Essential (primary) hypertension: Secondary | ICD-10-CM

## 2016-01-06 NOTE — Progress Notes (Signed)
Pre visit review using our clinic review tool, if applicable. No additional management support is needed unless otherwise documented below in the visit note.  Chief Complaint  Patient presents with  . Annual Exam    HPI: Patient  Kaylee Adams  65 y.o. comes in today for Preventive Health Care visit  And med managments HT:  Ok on meds  HAas  Neck   dialy annoying left last less than 5 min no assoc  iijured foot getting out of care to have medrol rx  Just finished and to do pt   Health Maintenance  Topic Date Due  . Hepatitis C Screening  1951/11/05  . HIV Screening  01/10/1967  . ZOSTAVAX  01/11/2012  . INFLUENZA VACCINE  07/24/2016  . TETANUS/TDAP  12/30/2016  . PAP SMEAR  08/04/2017  . MAMMOGRAM  10/24/2017  . COLONOSCOPY  12/30/2018   Health Maintenance Review LIFESTYLE:  Exercise:  n Tobacco/ETS:n Alcohol:  n Sugar beverages: Sleep:6-8 hours Drug use: no  ROS:  GEN/ HEENT: No fever, significant weight changes sweats headaches vision problems hearing changes, CV/ PULM; No chest pain shortness of breath cough, syncope,edema  change in exercise tolerance. GI /GU: No adominal pain, vomiting, change in bowel habits. No blood in the stool. No significant GU symptoms. SKIN/HEME: ,no acute skin rashes suspicious lesions or bleeding. No lymphadenopathy, nodules, masses.  NEURO/ PSYCH:  No neurologic signs such as weakness numbness. No depression anxiety. IMM/ Allergy: No unusual infections.  Allergy .   REST of 12 system review negative except as per HPI   Past Medical History  Diagnosis Date  . HLD (hyperlipidemia)   . HTN (hypertension)   . CAD (coronary artery disease)     a. 11/2005 Cath: nonobs dzs, 30% LAD lesion on cath ;  b. 11/2005 Echo: NL EF;  c. 04/2012 Echo: EF 55-60%, Gr 2 DD;  d. 04/2012 Ex MV: EF 72%, ST depression in recovery with NL perfusion imaging - HTN response to exercise.  . Hepatic cyst     on ct [place]  . Midsternal chest pain     cardiology eval  with non-ischemic stress test 04/2012    Past Surgical History  Procedure Laterality Date  . Vesicovaginal fistula closure w/ tah      for fibroid tumors  . Breast biopsy    . Cholecystectomy    . Child birth x 5    . Abdominal hysterectomy      fibroid tumors  . Shoulder arthroscopy with rotator cuff repair and subacromial decompression Right 11/03/2013    Procedure: RIGHT SHOULDER ARTHROSCOPY WITH DEBRIDEMENT, ROTATOR CUFF REPAIR AND SUBACROMIAL DECOMPRESSION, DISTAL CLAVICLE RESECTION;  Surgeon: Mcarthur Rossetti, MD;  Location: [facility];  Service: Orthopedics;  Laterality: Right;    Family History  Problem Relation Age of Onset  . Breast cancer Sister   . Thyroid disease Sister   . Hypertension Mother     Social History   Social History  . Marital Status: Married    Spouse Name: N/A  . Number of Children: 5  . Years of Education: N/A   Occupational History  .     Social History Main Topics  . Smoking status: Never Smoker   . Smokeless tobacco: Never Used  . Alcohol Use: No  . Drug Use: No  . Sexual Activity: Not Asked   Other Topics Concern  . None   Social History Narrative   Married, full time Network engineer. 3 pets  [place]   6-8 hours sleep     Outpatient Prescriptions Prior to Visit  Medication Sig Dispense Refill  . amLODipine (NORVASC) 5 MG tablet Take 1 tablet (5 mg total) by mouth daily. 90 tablet 1  . losartan-hydrochlorothiazide (HYZAAR) 100-25 MG per tablet TAKE 1 TABLET BY MOUTH EVERY DAY 90 tablet 1  . cyclobenzaprine (FLEXERIL) 5 MG tablet Take 1-2 tablets (5-10 mg total) by mouth at bedtime. For headache 30 tablet 0   No facility-administered medications prior to visit.     EXAM:  BP 132/76 mmHg  Temp(Src) 97.9 F (36.6 C) (Oral)  Ht 5' (1.524 m)  Wt 173 lb (78.472 kg)  BMI 33.79 kg/m2  Body mass index is 33.79 kg/(m^2).  Physical Exam: Vital signs reviewed GQQ:PYPP is a well-developed well-nourished alert cooperative     who appearsr stated age in no acute distress.  HEENT: normocephalic atraumatic , Eyes: PERRL EOM's full, conjunctiva clear, Nares: paten,t no deformity discharge or tenderness., Ears: no deformity EAC's clear TMs with normal landmarks. Mouth: clear OP, no lesions, edema.  Moist mucous membranes. Dentition in adequate repair. NECK: supple without masses, thyromegaly or bruits. CHEST/PULM:  Clear to auscultation and percussion breath sounds equal no wheeze , rales or rhonchi. No chest wall deformities or tenderness.Breast: normal by inspection . No dimpling, discharge, masses, tenderness or discharge . CV: PMI is nondisplaced, S1 S2 no gallops, murmurs, rubs. Peripheral pulses are full without delay.No JVD .  ABDOMEN: Bowel sounds normal nontender  No guard or rebound, no hepato splenomegal no CVA tenderness.  No hernia. Extremtities:  No clubbing cyanosis or edema, no acute joint swelling or redness no focal atrophy  rfoopt  sieht toe contractures  No edema rendses pulsed intact no  Lesions  NEURO:  Oriented x3, cranial nerves 3-12 appear to be intact, no obvious focal weakness,gait within normal limits no abnormal reflexes or asymmetrical SKIN: No acute rashes normal turgor, color, no bruising or petechiae. PSYCH: Oriented, good eye contact, no obvious depression anxiety, cognition and judgment appear normal. LN: no cervical axillary inguinal adenopathy  Lab Results  Component Value Date   WBC 12.6* 12/30/2015   HGB 13.8 12/30/2015   HCT 42.1 12/30/2015   PLT 350.0 12/30/2015   GLUCOSE 106* 12/30/2015   CHOL 263* 12/30/2015   TRIG 60.0 12/30/2015   HDL 65.60 12/30/2015   LDLDIRECT 153.2 06/23/2012   LDLCALC 186* 12/30/2015   ALT 21 12/30/2015   AST 17 12/30/2015   NA 142 12/30/2015   K 3.7 12/30/2015   CL 108 12/30/2015   CREATININE 0.74 12/30/2015   BUN 18 12/30/2015   CO2 27 12/30/2015   TSH 0.54 12/30/2015   INR 0.9 11/29/2008   HGBA1C 5.7 02/12/2008    ASSESSMENT AND  PLAN:  Discussed the following assessment and plan:  Visit for preventive health examination  Essential hypertension  HLD (hyperlipidemia) - deteriorated  ascvd  10y risk 10 % lsi pt feels no need for nutrition referral labs in 6 months fu depending on results   the   elevated fbs and wbc was from the steroid oral ( and  Holiday eating)  Head pain sound liocal poss neck related   Fur if  persistent or progressive or assoic sx  Patient Care Team: Burnis Medin, MD as PCP - General Milus Banister, MD (Gastroenterology) Fay Records, MD (Cardiology) Patient Instructions    Intensify lifestyle interventions.  To get cholesterol down .  ascvd risk assessment  Guidelines  2013  10 % 10 year risk   If not coming down  You may benefit from statin medication for  Prevention of vascular event  contnue  Blood pressure control.  Can get hiv and hep c screen at  Next blood test will put in order in system  For future    Health Maintenance, Female Adopting a healthy lifestyle and getting preventive care can go a long way to promote health and wellness. Talk with your health care provider about what schedule of regular examinations is right for you. This is a good chance for you to check in with your provider about disease prevention and staying healthy. In between checkups, there are plenty of things you can do on your own. Experts have done a lot of research about which lifestyle changes and preventive measures are most likely to keep you healthy. Ask your health care provider for more information. WEIGHT AND DIET  Eat a healthy diet  Be sure to include plenty of vegetables, fruits, low-fat dairy products, and lean protein.  Do not eat a lot of foods high in solid fats, added sugars, or salt.  Get regular exercise. This is one of the most important things you can do for your health.  Most adults should exercise for at least 150 minutes each week. The exercise should increase your heart  rate and make you sweat (moderate-intensity exercise).  Most adults should also do strengthening exercises at least twice a week. This is in addition to the moderate-intensity exercise.  Maintain a healthy weight  Body mass index (BMI) is a measurement that can be used to identify possible weight problems. It estimates body fat based on height and weight. Your health care provider can help determine your BMI and help you achieve or maintain a healthy weight.  For females 15 years of age and older:   A BMI below 18.5 is considered underweight.  A BMI of 18.5 to 24.9 is normal.  A BMI of 25 to 29.9 is considered overweight.  A BMI of 30 and above is considered obese.  Watch levels of cholesterol and blood lipids  You should start having your blood tested for lipids and cholesterol at 64 years of age, then have this test every 5 years.  You may need to have your cholesterol levels checked more often if:  Your lipid or cholesterol levels are high.  You are older than 64 years of age.  You are at high risk for heart disease.  CANCER SCREENING   Lung Cancer  Lung cancer screening is recommended for adults 43-77 years old who are at high risk for lung cancer because of a history of smoking.  A yearly low-dose CT scan of the lungs is recommended for people who:  Currently smoke.  Have quit within the past 15 years.  Have at least a 30-pack-year history of smoking. A pack year is smoking an average of one pack of cigarettes a day for 1 year.  Yearly screening should continue until it has been 15 years since you quit.  Yearly screening should stop if you develop a health problem that would prevent you from having lung cancer treatment.  Breast Cancer  Practice breast self-awareness. This means understanding how your breasts normally appear and feel.  It also means doing regular breast self-exams. Let your health care provider know about any changes, no matter how  small.  If you are in your 20s or 30s, you should have a clinical breast exam (CBE)  by a health care provider every 1-3 years as part of a regular health exam.  If you are 47 or older, have a CBE every year. Also consider having a breast X-ray (mammogram) every year.  If you have a family history of breast cancer, talk to your health care provider about genetic screening.  If you are at high risk for breast cancer, talk to your health care provider about having an MRI and a mammogram every year.  Breast cancer gene (BRCA) assessment is recommended for women who have family members with BRCA-related cancers. BRCA-related cancers include:  Breast.  Ovarian.  Tubal.  Peritoneal cancers.  Results of the assessment will determine the need for genetic counseling and BRCA1 and BRCA2 testing. Cervical Cancer Your health care provider may recommend that you be screened regularly for cancer of the pelvic organs (ovaries, uterus, and vagina). This screening involves a pelvic examination, including checking for microscopic changes to the surface of your cervix (Pap test). You may be encouraged to have this screening done every 3 years, beginning at age 22.  For women ages 37-65, health care providers may recommend pelvic exams and Pap testing every 3 years, or they may recommend the Pap and pelvic exam, combined with testing for human papilloma virus (HPV), every 5 years. Some types of HPV increase your risk of cervical cancer. Testing for HPV may also be done on women of any age with unclear Pap test results.  Other health care providers may not recommend any screening for nonpregnant women who are considered low risk for pelvic cancer and who do not have symptoms. Ask your health care provider if a screening pelvic exam is right for you.  If you have had past treatment for cervical cancer or a condition that could lead to cancer, you need Pap tests and screening for cancer for at least 20 years  after your treatment. If Pap tests have been discontinued, your risk factors (such as having a new sexual partner) need to be reassessed to determine if screening should resume. Some women have medical problems that increase the chance of getting cervical cancer. In these cases, your health care provider may recommend more frequent screening and Pap tests. Colorectal Cancer  This type of cancer can be detected and often prevented.  Routine colorectal cancer screening usually begins at 64 years of age and continues through 64 years of age.  Your health care provider may recommend screening at an earlier age if you have risk factors for colon cancer.  Your health care provider may also recommend using home test kits to check for hidden blood in the stool.  A small camera at the end of a tube can be used to examine your colon directly (sigmoidoscopy or colonoscopy). This is done to check for the earliest forms of colorectal cancer.  Routine screening usually begins at age 1.  Direct examination of the colon should be repeated every 5-10 years through 64 years of age. However, you may need to be screened more often if early forms of precancerous polyps or small growths are found. Skin Cancer  Check your skin from head to toe regularly.  Tell your health care provider about any new moles or changes in moles, especially if there is a change in a mole's shape or color.  Also tell your health care provider if you have a mole that is larger than the size of a pencil eraser.  Always use sunscreen. Apply sunscreen liberally and repeatedly throughout the day.  Protect yourself by wearing long sleeves, pants, a wide-brimmed hat, and sunglasses whenever you are outside. HEART DISEASE, DIABETES, AND HIGH BLOOD PRESSURE   High blood pressure causes heart disease and increases the risk of stroke. High blood pressure is more likely to develop in:  People who have blood pressure in the high end of the  normal range (130-139/85-89 mm Hg).  People who are overweight or obese.  People who are African American.  If you are 62-21 years of age, have your blood pressure checked every 3-5 years. If you are 83 years of age or older, have your blood pressure checked every year. You should have your blood pressure measured twice--once when you are at a hospital or clinic, and once when you are not at a hospital or clinic. Record the average of the two measurements. To check your blood pressure when you are not at a hospital or clinic, you can use:  An automated blood pressure machine at a pharmacy.  A home blood pressure monitor.  If you are between 54 years and 81 years old, ask your health care provider if you should take aspirin to prevent strokes.  Have regular diabetes screenings. This involves taking a blood sample to check your fasting blood sugar level.  If you are at a normal weight and have a low risk for diabetes, have this test once every three years after 64 years of age.  If you are overweight and have a high risk for diabetes, consider being tested at a younger age or more often. PREVENTING INFECTION  Hepatitis B  If you have a higher risk for hepatitis B, you should be screened for this virus. You are considered at high risk for hepatitis B if:  You were born in a country where hepatitis B is common. Ask your health care provider which countries are considered high risk.  Your parents were born in a high-risk country, and you have not been immunized against hepatitis B (hepatitis B vaccine).  You have HIV or AIDS.  You use needles to inject street drugs.  You live with someone who has hepatitis B.  You have had sex with someone who has hepatitis B.  You get hemodialysis treatment.  You take certain medicines for conditions, including cancer, organ transplantation, and autoimmune conditions. Hepatitis C  Blood testing is recommended for:  Everyone born from [birth year]  through 1964.  Anyone with known risk factors for hepatitis C. Sexually transmitted infections (STIs)  You should be screened for sexually transmitted infections (STIs) including gonorrhea and chlamydia if:  You are sexually active and are younger than 64 years of age.  You are older than 64 years of age and your health care provider tells you that you are at risk for this type of infection.  Your sexual activity has changed since you were last screened and you are at an increased risk for chlamydia or gonorrhea. Ask your health care provider if you are at risk.  If you do not have HIV, but are at risk, it may be recommended that you take a prescription medicine daily to prevent HIV infection. This is called pre-exposure prophylaxis (PrEP). You are considered at risk if:  You are sexually active and do not regularly use condoms or know the HIV status of your partner(s).  You take drugs by injection.  You are sexually active with a partner who has HIV. Talk with your health care provider about whether you are at high risk of being infected  with HIV. If you choose to begin PrEP, you should first be tested for HIV. You should then be tested every 3 months for as long as you are taking PrEP.  PREGNANCY   If you are premenopausal and you may become pregnant, ask your health care provider about preconception counseling.  If you may become pregnant, take 400 to 800 micrograms (mcg) of folic acid every day.  If you want to prevent pregnancy, talk to your health care provider about birth control (contraception). OSTEOPOROSIS AND MENOPAUSE   Osteoporosis is a disease in which the bones lose minerals and strength with aging. This can result in serious bone fractures. Your risk for osteoporosis can be identified using a bone density scan.  If you are 25 years of age or older, or if you are at risk for osteoporosis and fractures, ask your health care provider if you should be screened.  Ask your  health care provider whether you should take a calcium or vitamin D supplement to lower your risk for osteoporosis.  Menopause may have certain physical symptoms and risks.  Hormone replacement therapy may reduce some of these symptoms and risks. Talk to your health care provider about whether hormone replacement therapy is right for you.  HOME CARE INSTRUCTIONS   Schedule regular health, dental, and eye exams.  Stay current with your immunizations.   Do not use any tobacco products including cigarettes, chewing tobacco, or electronic cigarettes.  If you are pregnant, do not drink alcohol.  If you are breastfeeding, limit how much and how often you drink alcohol.  Limit alcohol intake to no more than 1 drink per day for nonpregnant women. One drink equals 12 ounces of beer, 5 ounces of wine, or 1 ounces of hard liquor.  Do not use street drugs.  Do not share needles.  Ask your health care provider for help if you need support or information about quitting drugs.  Tell your health care provider if you often feel depressed.  Tell your health care provider if you have ever been abused or do not feel safe at home.   This information is not intended to replace advice given to you by your health care provider. Make sure you discuss any questions you have with your health care provider.   Document Released: 06/25/2011 Document Revised: 12/31/2014 Document Reviewed: 11/11/2013 Elsevier Interactive Patient Education [employer].    Why follow it? Research shows. . Those who follow the Mediterranean diet have a reduced risk of heart disease  . The diet is associated with a reduced incidence of Parkinson's and Alzheimer's diseases . People following the diet may have longer life expectancies and lower rates of chronic diseases  . The Dietary Guidelines for Americans recommends the Mediterranean diet as an eating plan to promote health and prevent disease  What Is the  Mediterranean Diet?  . Healthy eating plan based on typical foods and recipes of Mediterranean-style cooking . The diet is primarily a plant based diet; these foods should make up a majority of meals   Starches - Plant based foods should make up a majority of meals - They are an important sources of vitamins, minerals, energy, antioxidants, and fiber - Choose whole grains, foods high in fiber and minimally processed items  - Typical grain sources include wheat, oats, barley, corn, brown rice, bulgar, farro, millet, polenta, couscous  - Various types of beans include chickpeas, lentils, fava beans, black beans, white beans   Fruits  Veggies - Large  quantities of antioxidant rich fruits & veggies; 6 or more servings  - Vegetables can be eaten raw or lightly drizzled with oil and cooked  - Vegetables common to the traditional Mediterranean Diet include: artichokes, arugula, beets, broccoli, brussel sprouts, cabbage, carrots, celery, collard greens, cucumbers, eggplant, kale, leeks, lemons, lettuce, mushrooms, okra, onions, peas, peppers, potatoes, pumpkin, radishes, rutabaga, shallots, spinach, sweet potatoes, turnips, zucchini - Fruits common to the Mediterranean Diet include: apples, apricots, avocados, cherries, clementines, dates, figs, grapefruits, grapes, melons, nectarines, oranges, peaches, pears, pomegranates, strawberries, tangerines  Fats - Replace butter and margarine with healthy oils, such as olive oil, canola oil, and tahini  - Limit nuts to no more than a handful a day  - Nuts include walnuts, almonds, pecans, pistachios, pine nuts  - Limit or avoid candied, honey roasted or heavily salted nuts - Olives are central to the [employer] - can be eaten whole or used in a variety of dishes   Meats Protein - Limiting red meat: no more than a few times a month - When eating red meat: choose lean cuts and keep the portion to the size of deck of cards - Eggs: approx. 0 to 4 times a  week  - Fish and lean poultry: at least 2 a week  - Healthy protein sources include, chicken, [place], lean beef, lamb - Increase intake of seafood such as tuna, salmon, trout, mackerel, shrimp, scallops - Avoid or limit high fat processed meats such as sausage and bacon  Dairy - Include moderate amounts of low fat dairy products  - Focus on healthy dairy such as fat free yogurt, skim milk, low or reduced fat cheese - Limit dairy products higher in fat such as whole or 2% milk, cheese, ice cream  Alcohol - Moderate amounts of red wine is ok  - No more than 5 oz daily for women (all ages) and men older than age 40  - No more than 10 oz of wine daily for men younger than 77  Other - Limit sweets and other desserts  - Use herbs and spices instead of salt to flavor foods  - Herbs and spices common to the traditional Mediterranean Diet include: basil, bay leaves, chives, cloves, cumin, fennel, garlic, lavender, marjoram, mint, oregano, parsley, pepper, rosemary, sage, savory, sumac, tarragon, thyme   It's not just a diet, it's a lifestyle:  . The Mediterranean diet includes lifestyle factors typical of those in the region  . Foods, drinks and meals are best eaten with others and savored . Daily physical activity is important for overall good health . This could be strenuous exercise like running and aerobics . This could also be more leisurely activities such as walking, housework, yard-work, or taking the stairs . Moderation is the key; a balanced and healthy diet accommodates most foods and drinks . Consider portion sizes and frequency of consumption of certain foods   Meal Ideas & Options:  . Breakfast:  o Whole wheat toast or whole wheat English muffins with peanut butter & hard boiled egg o Steel cut oats topped with apples & cinnamon and skim milk  o Fresh fruit: banana, strawberries, melon, berries, peaches  o Smoothies: strawberries, bananas, greek yogurt, peanut butter o Low fat  greek yogurt with blueberries and granola  o Egg white omelet with spinach and mushrooms o Breakfast couscous: whole wheat couscous, apricots, skim milk, cranberries  . Sandwiches:  o Hummus and grilled vegetables (peppers, zucchini, squash) on whole wheat bread  o Grilled chicken on whole wheat pita with lettuce, tomatoes, cucumbers or tzatziki  o Tuna salad on whole wheat bread: tuna salad made with greek yogurt, olives, red peppers, capers, green onions o Garlic rosemary lamb pita: lamb sauted with garlic, rosemary, salt & pepper; add lettuce, cucumber, greek yogurt to pita - flavor with lemon juice and black pepper  . Seafood:  o Mediterranean grilled salmon, seasoned with garlic, basil, parsley, lemon juice and black pepper o Shrimp, lemon, and spinach whole-grain pasta salad made with low fat greek yogurt  o Seared scallops with lemon orzo  o Seared tuna steaks seasoned salt, pepper, coriander topped with tomato mixture of olives, tomatoes, olive oil, minced garlic, parsley, green onions and cappers  . Meats:  o Herbed greek chicken salad with kalamata olives, cucumber, feta  o Red bell peppers stuffed with spinach, bulgur, lean ground beef (or lentils) & topped with feta   o Kebabs: skewers of chicken, tomatoes, onions, zucchini, squash  o [place] burgers: made with red onions, mint, dill, lemon juice, feta cheese topped with roasted red peppers . Vegetarian o Cucumber salad: cucumbers, artichoke hearts, celery, red onion, feta cheese, tossed in olive oil & lemon juice  o Hummus and whole grain pita points with a greek salad (lettuce, tomato, feta, olives, cucumbers, red onion) o Lentil soup with celery, carrots made with vegetable broth, garlic, salt and pepper  o Tabouli salad: parsley, bulgur, mint, scallions, cucumbers, tomato, radishes, lemon juice, olive oil, salt and pepper.         Standley Brooking. Panosh M.D.

## 2016-01-06 NOTE — Patient Instructions (Addendum)
Intensify lifestyle interventions.  To get cholesterol down .  ascvd risk assessment  Guidelines  2013  10 % 10 year risk   If not coming down  You may benefit from statin medication for  Prevention of vascular event  contnue  Blood pressure control.  Can get hiv and hep c screen at  Next blood test will put in order in system  For future    Health Maintenance, Female Adopting a healthy lifestyle and getting preventive care can go a long way to promote health and wellness. Talk with your health care provider about what schedule of regular examinations is right for you. This is a good chance for you to check in with your provider about disease prevention and staying healthy. In between checkups, there are plenty of things you can do on your own. Experts have done a lot of research about which lifestyle changes and preventive measures are most likely to keep you healthy. Ask your health care provider for more information. WEIGHT AND DIET  Eat a healthy diet  Be sure to include plenty of vegetables, fruits, low-fat dairy products, and lean protein.  Do not eat a lot of foods high in solid fats, added sugars, or salt.  Get regular exercise. This is one of the most important things you can do for your health.  Most adults should exercise for at least 150 minutes each week. The exercise should increase your heart rate and make you sweat (moderate-intensity exercise).  Most adults should also do strengthening exercises at least twice a week. This is in addition to the moderate-intensity exercise.  Maintain a healthy weight  Body mass index (BMI) is a measurement that can be used to identify possible weight problems. It estimates body fat based on height and weight. Your health care provider can help determine your BMI and help you achieve or maintain a healthy weight.  For females 84 years of age and older:   A BMI below 18.5 is considered underweight.  A BMI of 18.5 to 24.9 is  normal.  A BMI of 25 to 29.9 is considered overweight.  A BMI of 30 and above is considered obese.  Watch levels of cholesterol and blood lipids  You should start having your blood tested for lipids and cholesterol at 64 years of age, then have this test every 5 years.  You may need to have your cholesterol levels checked more often if:  Your lipid or cholesterol levels are high.  You are older than 64 years of age.  You are at high risk for heart disease.  CANCER SCREENING   Lung Cancer  Lung cancer screening is recommended for adults 78-49 years old who are at high risk for lung cancer because of a history of smoking.  A yearly low-dose CT scan of the lungs is recommended for people who:  Currently smoke.  Have quit within the past 15 years.  Have at least a 30-pack-year history of smoking. A pack year is smoking an average of one pack of cigarettes a day for 1 year.  Yearly screening should continue until it has been 15 years since you quit.  Yearly screening should stop if you develop a health problem that would prevent you from having lung cancer treatment.  Breast Cancer  Practice breast self-awareness. This means understanding how your breasts normally appear and feel.  It also means doing regular breast self-exams. Let your health care provider know about any changes, no matter how small.  If  you are in your 20s or 30s, you should have a clinical breast exam (CBE) by a health care provider every 1-3 years as part of a regular health exam.  If you are 21 or older, have a CBE every year. Also consider having a breast X-ray (mammogram) every year.  If you have a family history of breast cancer, talk to your health care provider about genetic screening.  If you are at high risk for breast cancer, talk to your health care provider about having an MRI and a mammogram every year.  Breast cancer gene (BRCA) assessment is recommended for women who have family members  with BRCA-related cancers. BRCA-related cancers include:  Breast.  Ovarian.  Tubal.  Peritoneal cancers.  Results of the assessment will determine the need for genetic counseling and BRCA1 and BRCA2 testing. Cervical Cancer Your health care provider may recommend that you be screened regularly for cancer of the pelvic organs (ovaries, uterus, and vagina). This screening involves a pelvic examination, including checking for microscopic changes to the surface of your cervix (Pap test). You may be encouraged to have this screening done every 3 years, beginning at age 61.  For women ages 72-65, health care providers may recommend pelvic exams and Pap testing every 3 years, or they may recommend the Pap and pelvic exam, combined with testing for human papilloma virus (HPV), every 5 years. Some types of HPV increase your risk of cervical cancer. Testing for HPV may also be done on women of any age with unclear Pap test results.  Other health care providers may not recommend any screening for nonpregnant women who are considered low risk for pelvic cancer and who do not have symptoms. Ask your health care provider if a screening pelvic exam is right for you.  If you have had past treatment for cervical cancer or a condition that could lead to cancer, you need Pap tests and screening for cancer for at least 20 years after your treatment. If Pap tests have been discontinued, your risk factors (such as having a new sexual partner) need to be reassessed to determine if screening should resume. Some women have medical problems that increase the chance of getting cervical cancer. In these cases, your health care provider may recommend more frequent screening and Pap tests. Colorectal Cancer  This type of cancer can be detected and often prevented.  Routine colorectal cancer screening usually begins at 64 years of age and continues through 64 years of age.  Your health care provider may recommend  screening at an earlier age if you have risk factors for colon cancer.  Your health care provider may also recommend using home test kits to check for hidden blood in the stool.  A small camera at the end of a tube can be used to examine your colon directly (sigmoidoscopy or colonoscopy). This is done to check for the earliest forms of colorectal cancer.  Routine screening usually begins at age 36.  Direct examination of the colon should be repeated every 5-10 years through 64 years of age. However, you may need to be screened more often if early forms of precancerous polyps or small growths are found. Skin Cancer  Check your skin from head to toe regularly.  Tell your health care provider about any new moles or changes in moles, especially if there is a change in a mole's shape or color.  Also tell your health care provider if you have a mole that is larger than the size  of a pencil eraser.  Always use sunscreen. Apply sunscreen liberally and repeatedly throughout the day.  Protect yourself by wearing long sleeves, pants, a wide-brimmed hat, and sunglasses whenever you are outside. HEART DISEASE, DIABETES, AND HIGH BLOOD PRESSURE   High blood pressure causes heart disease and increases the risk of stroke. High blood pressure is more likely to develop in:  People who have blood pressure in the high end of the normal range (130-139/85-89 mm Hg).  People who are overweight or obese.  People who are African American.  If you are 51-70 years of age, have your blood pressure checked every 3-5 years. If you are 43 years of age or older, have your blood pressure checked every year. You should have your blood pressure measured twice--once when you are at a hospital or clinic, and once when you are not at a hospital or clinic. Record the average of the two measurements. To check your blood pressure when you are not at a hospital or clinic, you can use:  An automated blood pressure machine at a  pharmacy.  A home blood pressure monitor.  If you are between 36 years and 66 years old, ask your health care provider if you should take aspirin to prevent strokes.  Have regular diabetes screenings. This involves taking a blood sample to check your fasting blood sugar level.  If you are at a normal weight and have a low risk for diabetes, have this test once every three years after 64 years of age.  If you are overweight and have a high risk for diabetes, consider being tested at a younger age or more often. PREVENTING INFECTION  Hepatitis B  If you have a higher risk for hepatitis B, you should be screened for this virus. You are considered at high risk for hepatitis B if:  You were born in a country where hepatitis B is common. Ask your health care provider which countries are considered high risk.  Your parents were born in a high-risk country, and you have not been immunized against hepatitis B (hepatitis B vaccine).  You have HIV or AIDS.  You use needles to inject street drugs.  You live with someone who has hepatitis B.  You have had sex with someone who has hepatitis B.  You get hemodialysis treatment.  You take certain medicines for conditions, including cancer, organ transplantation, and autoimmune conditions. Hepatitis C  Blood testing is recommended for:  Everyone born from [birth year] through 1964.  Anyone with known risk factors for hepatitis C. Sexually transmitted infections (STIs)  You should be screened for sexually transmitted infections (STIs) including gonorrhea and chlamydia if:  You are sexually active and are younger than 64 years of age.  You are older than 64 years of age and your health care provider tells you that you are at risk for this type of infection.  Your sexual activity has changed since you were last screened and you are at an increased risk for chlamydia or gonorrhea. Ask your health care provider if you are at risk.  If you do not  have HIV, but are at risk, it may be recommended that you take a prescription medicine daily to prevent HIV infection. This is called pre-exposure prophylaxis (PrEP). You are considered at risk if:  You are sexually active and do not regularly use condoms or know the HIV status of your partner(s).  You take drugs by injection.  You are sexually active with a partner who has  HIV. Talk with your health care provider about whether you are at high risk of being infected with HIV. If you choose to begin PrEP, you should first be tested for HIV. You should then be tested every 3 months for as long as you are taking PrEP.  PREGNANCY   If you are premenopausal and you may become pregnant, ask your health care provider about preconception counseling.  If you may become pregnant, take 400 to 800 micrograms (mcg) of folic acid every day.  If you want to prevent pregnancy, talk to your health care provider about birth control (contraception). OSTEOPOROSIS AND MENOPAUSE   Osteoporosis is a disease in which the bones lose minerals and strength with aging. This can result in serious bone fractures. Your risk for osteoporosis can be identified using a bone density scan.  If you are 46 years of age or older, or if you are at risk for osteoporosis and fractures, ask your health care provider if you should be screened.  Ask your health care provider whether you should take a calcium or vitamin D supplement to lower your risk for osteoporosis.  Menopause may have certain physical symptoms and risks.  Hormone replacement therapy may reduce some of these symptoms and risks. Talk to your health care provider about whether hormone replacement therapy is right for you.  HOME CARE INSTRUCTIONS   Schedule regular health, dental, and eye exams.  Stay current with your immunizations.   Do not use any tobacco products including cigarettes, chewing tobacco, or electronic cigarettes.  If you are pregnant, do not  drink alcohol.  If you are breastfeeding, limit how much and how often you drink alcohol.  Limit alcohol intake to no more than 1 drink per day for nonpregnant women. One drink equals 12 ounces of beer, 5 ounces of wine, or 1 ounces of hard liquor.  Do not use street drugs.  Do not share needles.  Ask your health care provider for help if you need support or information about quitting drugs.  Tell your health care provider if you often feel depressed.  Tell your health care provider if you have ever been abused or do not feel safe at home.   This information is not intended to replace advice given to you by your health care provider. Make sure you discuss any questions you have with your health care provider.   Document Released: 06/25/2011 Document Revised: 12/31/2014 Document Reviewed: 11/11/2013 Elsevier Interactive Patient Education [employer].    Why follow it? Research shows. . Those who follow the Mediterranean diet have a reduced risk of heart disease  . The diet is associated with a reduced incidence of Parkinson's and Alzheimer's diseases . People following the diet may have longer life expectancies and lower rates of chronic diseases  . The Dietary Guidelines for Americans recommends the Mediterranean diet as an eating plan to promote health and prevent disease  What Is the Mediterranean Diet?  . Healthy eating plan based on typical foods and recipes of Mediterranean-style cooking . The diet is primarily a plant based diet; these foods should make up a majority of meals   Starches - Plant based foods should make up a majority of meals - They are an important sources of vitamins, minerals, energy, antioxidants, and fiber - Choose whole grains, foods high in fiber and minimally processed items  - Typical grain sources include wheat, oats, barley, corn, brown rice, bulgar, farro, millet, polenta, couscous  - Various types of beans  include chickpeas, lentils, fava  beans, black beans, white beans   Fruits  Veggies - Large quantities of antioxidant rich fruits & veggies; 6 or more servings  - Vegetables can be eaten raw or lightly drizzled with oil and cooked  - Vegetables common to the traditional Mediterranean Diet include: artichokes, arugula, beets, broccoli, brussel sprouts, cabbage, carrots, celery, collard greens, cucumbers, eggplant, kale, leeks, lemons, lettuce, mushrooms, okra, onions, peas, peppers, potatoes, pumpkin, radishes, rutabaga, shallots, spinach, sweet potatoes, turnips, zucchini - Fruits common to the Mediterranean Diet include: apples, apricots, avocados, cherries, clementines, dates, figs, grapefruits, grapes, melons, nectarines, oranges, peaches, pears, pomegranates, strawberries, tangerines  Fats - Replace butter and margarine with healthy oils, such as olive oil, canola oil, and tahini  - Limit nuts to no more than a handful a day  - Nuts include walnuts, almonds, pecans, pistachios, pine nuts  - Limit or avoid candied, honey roasted or heavily salted nuts - Olives are central to the [employer] - can be eaten whole or used in a variety of dishes   Meats Protein - Limiting red meat: no more than a few times a month - When eating red meat: choose lean cuts and keep the portion to the size of deck of cards - Eggs: approx. 0 to 4 times a week  - Fish and lean poultry: at least 2 a week  - Healthy protein sources include, chicken, [place], lean beef, lamb - Increase intake of seafood such as tuna, salmon, trout, mackerel, shrimp, scallops - Avoid or limit high fat processed meats such as sausage and bacon  Dairy - Include moderate amounts of low fat dairy products  - Focus on healthy dairy such as fat free yogurt, skim milk, low or reduced fat cheese - Limit dairy products higher in fat such as whole or 2% milk, cheese, ice cream  Alcohol - Moderate amounts of red wine is ok  - No more than 5 oz daily for women (all ages) and  men older than age 49  - No more than 10 oz of wine daily for men younger than 32  Other - Limit sweets and other desserts  - Use herbs and spices instead of salt to flavor foods  - Herbs and spices common to the traditional Mediterranean Diet include: basil, bay leaves, chives, cloves, cumin, fennel, garlic, lavender, marjoram, mint, oregano, parsley, pepper, rosemary, sage, savory, sumac, tarragon, thyme   It's not just a diet, it's a lifestyle:  . The Mediterranean diet includes lifestyle factors typical of those in the region  . Foods, drinks and meals are best eaten with others and savored . Daily physical activity is important for overall good health . This could be strenuous exercise like running and aerobics . This could also be more leisurely activities such as walking, housework, yard-work, or taking the stairs . Moderation is the key; a balanced and healthy diet accommodates most foods and drinks . Consider portion sizes and frequency of consumption of certain foods   Meal Ideas & Options:  . Breakfast:  o Whole wheat toast or whole wheat English muffins with peanut butter & hard boiled egg o Steel cut oats topped with apples & cinnamon and skim milk  o Fresh fruit: banana, strawberries, melon, berries, peaches  o Smoothies: strawberries, bananas, greek yogurt, peanut butter o Low fat greek yogurt with blueberries and granola  o Egg white omelet with spinach and mushrooms o Breakfast couscous: whole wheat couscous, apricots, skim milk, cranberries  .  Sandwiches:  o Hummus and grilled vegetables (peppers, zucchini, squash) on whole wheat bread   o Grilled chicken on whole wheat pita with lettuce, tomatoes, cucumbers or tzatziki  o Tuna salad on whole wheat bread: tuna salad made with greek yogurt, olives, red peppers, capers, green onions o Garlic rosemary lamb pita: lamb sauted with garlic, rosemary, salt & pepper; add lettuce, cucumber, greek yogurt to pita - flavor with  lemon juice and black pepper  . Seafood:  o Mediterranean grilled salmon, seasoned with garlic, basil, parsley, lemon juice and black pepper o Shrimp, lemon, and spinach whole-grain pasta salad made with low fat greek yogurt  o Seared scallops with lemon orzo  o Seared tuna steaks seasoned salt, pepper, coriander topped with tomato mixture of olives, tomatoes, olive oil, minced garlic, parsley, green onions and cappers  . Meats:  o Herbed greek chicken salad with kalamata olives, cucumber, feta  o Red bell peppers stuffed with spinach, bulgur, lean ground beef (or lentils) & topped with feta   o Kebabs: skewers of chicken, tomatoes, onions, zucchini, squash  o [place] burgers: made with red onions, mint, dill, lemon juice, feta cheese topped with roasted red peppers . Vegetarian o Cucumber salad: cucumbers, artichoke hearts, celery, red onion, feta cheese, tossed in olive oil & lemon juice  o Hummus and whole grain pita points with a greek salad (lettuce, tomato, feta, olives, cucumbers, red onion) o Lentil soup with celery, carrots made with vegetable broth, garlic, salt and pepper  o Tabouli salad: parsley, bulgur, mint, scallions, cucumbers, tomato, radishes, lemon juice, olive oil, salt and pepper.

## 2016-01-09 ENCOUNTER — Ambulatory Visit (HOSPITAL_COMMUNITY): Payer: 59 | Admitting: Physical Therapy

## 2016-01-09 ENCOUNTER — Ambulatory Visit (HOSPITAL_COMMUNITY): Payer: 59 | Attending: Orthopaedic Surgery | Admitting: Physical Therapy

## 2016-01-09 DIAGNOSIS — R269 Unspecified abnormalities of gait and mobility: Secondary | ICD-10-CM | POA: Diagnosis not present

## 2016-01-09 DIAGNOSIS — M25571 Pain in right ankle and joints of right foot: Secondary | ICD-10-CM | POA: Insufficient documentation

## 2016-01-09 DIAGNOSIS — R29898 Other symptoms and signs involving the musculoskeletal system: Secondary | ICD-10-CM | POA: Diagnosis not present

## 2016-01-09 DIAGNOSIS — M25671 Stiffness of right ankle, not elsewhere classified: Secondary | ICD-10-CM | POA: Insufficient documentation

## 2016-01-09 NOTE — Patient Instructions (Signed)
Gastroc Stretch    Stand with right foot back, leg straight, forward leg bent. Keeping heel on floor, turned slightly out, lean into wall until stretch is felt in calf. Hold _60___ seconds. Repeat __1-2__ times per set. Do _1___ sets per session. Do _5-10___ sessions per day.  [email or web address]   Copyright  VHI. All rights reserved.  Strengthening: Straight Leg Raise (Phase 1)    Tighten muscles on front of right thigh, then lift leg __15__ inches from surface, keeping knee locked.  Repeat _10___ times per set. Do _1___ sets per session. Do _2___ sessions per day.  [email or web address].[email or web address]   Copyright  VHI. All rights reserved.  Strengthening: Hip Abduction (Side-Lying)    Tighten muscles on front of left thigh, then lift leg _15___ inches from surface, keeping knee locked.  Repeat __10__ times per set. Do __1__ sets per session. Do ___2_ sessions per day.  http://[email or web address].[email or web address]   Copyright  VHI. All rights reserved.  Self-Mobilization: Knee Flexion (Prone)   With 3# on your ankle  Bring right  heel toward buttocks as close as possible. Hold _3___ seconds. Relax. Repeat __15__ times per set. Do __1__ sets per session. Do __2__ sessions per day.  [email or web address]://[email or web address].[email or web address]   Copyright  VHI. All rights reserved.  Strengthening: Hip Extension (Prone)    Tighten muscles on front of right thigh, then lift leg __2__ inches from surface, keeping knee locked. Repeat _10-15___ times per set. Do __1__ sets per session. Do __2__ sessions per day.  [email or web address].[email or web address]   Copyright  VHI. All rights reserved.

## 2016-01-09 NOTE — Therapy (Signed)
[place], [place], [address] Phone: [phone]   Fax:  [phone]  Physical Therapy Evaluation  Patient Details  Name: Kaylee Adams MRN: [id number] Date of Birth: 13-Aug-1951 Referring Provider: Eddie Dibbles  Encounter Date: 01/09/2016      PT End of Session - 01/09/16 0947    Visit Number 1   Number of Visits 12   Date for PT Re-Evaluation 02/08/16   Authorization Type UMR    Authorization - Visit Number 1   Authorization - Number of Visits 10   PT Start Time 0805   PT Stop Time 0845   PT Time Calculation (min) 40 min   Activity Tolerance Patient tolerated treatment well   Behavior During Therapy [facility] for tasks assessed/performed      Past Medical History  Diagnosis Date  . HLD (hyperlipidemia)   . HTN (hypertension)   . CAD (coronary artery disease)     a. 11/2005 Cath: nonobs dzs, 30% LAD lesion on cath ;  b. 11/2005 Echo: NL EF;  c. 04/2012 Echo: EF 55-60%, Gr 2 DD;  d. 04/2012 Ex MV: EF 72%, ST depression in recovery with NL perfusion imaging - HTN response to exercise.  . Hepatic cyst     on ct [place]  . Midsternal chest pain     cardiology eval with non-ischemic stress test 04/2012    Past Surgical History  Procedure Laterality Date  . Vesicovaginal fistula closure w/ tah      for fibroid tumors  . Breast biopsy    . Cholecystectomy    . Child birth x 5    . Abdominal hysterectomy      fibroid tumors  . Shoulder arthroscopy with rotator cuff repair and subacromial decompression Right 11/03/2013    Procedure: RIGHT SHOULDER ARTHROSCOPY WITH DEBRIDEMENT, ROTATOR CUFF REPAIR AND SUBACROMIAL DECOMPRESSION, DISTAL CLAVICLE RESECTION;  Surgeon: Mcarthur Rossetti, MD;  Location: [facility];  Service: Orthopedics;  Laterality: Right;    There were no vitals filed for this visit.  Visit Diagnosis:  Stiffness of ankle joint, right - Plan: PT plan of care cert/re-cert  Abnormality of gait - Plan: PT plan of  care cert/re-cert  Right leg weakness - Plan: PT plan of care cert/re-cert  Pain in joint, ankle and foot, right - Plan: PT plan of care cert/re-cert      Subjective Assessment - 01/09/16 0839    Subjective Ms. Asaro states that she twisted her Rt ankile around 12/07/2015 and she has had increased pain along the outer aspect of her Lt foot ever since.  She states that she taken prednisone which seemed to help but then the pain came right back.  She states that first thing in the morning is the worst  as well as if she is up on her feet for a long time.  She states that she has been having issues with her right leg cramping and being tight for as long as she can remember.      Pertinent History HTN   How long can you sit comfortably? as long as she wants she might have pain    How long can you stand comfortably? able to stand as long as she wants she might have some pain    Patient Stated Goals less pain    Currently in Pain? Yes   Pain Score 3    Pain Location Ankle   Pain Orientation Right;Lateral   Pain Descriptors / Indicators  Aching   Pain Type Chronic pain   Pain Onset 1 to 4 weeks ago   Pain Frequency Constant            [facility] PT Assessment - 01/09/16 0001    Assessment   Medical Diagnosis Rt Peroneus longus    Referring Provider Eddie Dibbles   Onset Date/Surgical Date 12/07/15   Next MD Visit 01/23/2016   Prior Therapy none   Precautions   Precautions None   Restrictions   Weight Bearing Restrictions No   Balance Screen   Has the patient fallen in the past 6 months No   Has the patient had a decrease in activity level because of a fear of falling?  No   Is the patient reluctant to leave their home because of a fear of falling?  No   Prior Function   Level of Independence Independent   Vocation Full time employment   Vocation Requirements sitting/standing    Leisure walking    Cognition   Overall Cognitive Status Within Functional Limits for tasks assessed    Observation/Other Assessments   Observations Pt gastroc mm is significantly atrophied commpared to Lt .    Focus on Therapeutic Outcomes (FOTO)  69   Functional Tests   Functional tests Single leg stance   Single Leg Stance   Comments Lt 25; Rt 11   ROM / Strength   AROM / PROM / Strength AROM;Strength   AROM   AROM Assessment Site Ankle   Right/Left Ankle Right;Left   Left Ankle Dorsiflexion -20   Left Ankle Plantar Flexion 60   Left Ankle Inversion 32   Left Ankle Eversion 12   Strength   Strength Assessment Site Hip;Knee;Ankle   Right/Left Hip Right   Right Hip Flexion 3+/5   Right Hip Extension 4-/5   Right Hip ABduction 4/5   Right/Left Knee Right   Right Knee Flexion 3+/5   Right Knee Extension 5/5   Right/Left Ankle Right   Right Ankle Dorsiflexion 3-/5   Right Ankle Plantar Flexion 3-/5   Right Ankle Inversion 3+/5   Right Ankle Eversion 3+/5                   [facility] Adult PT Treatment/Exercise - 01/09/16 0001    Exercises   Exercises Knee/Hip;Ankle   Knee/Hip Exercises: Stretches   Gastroc Stretch Right;3 reps;30 seconds   Gastroc Stretch Limitations PT very tight almost only needs to stand with heel on ground   Knee/Hip Exercises: Supine   Straight Leg Raises Strengthening;Right;5 sets   Knee/Hip Exercises: Sidelying   Hip ABduction 5 reps   Knee/Hip Exercises: Prone   Hamstring Curl 5 reps   Hip Extension 5 reps                PT Education - 01/09/16 0946    Education provided Yes   Education Details HEP   Person(s) Educated Patient   Methods Explanation;Handout;Verbal cues   Comprehension Verbalized understanding;Returned demonstration          PT Short Term Goals - 01/09/16 1002    PT SHORT TERM GOAL #1   Title Pt to be I in HEP in order to meet goals on a timely basis    Time 2   Period Weeks   PT SHORT TERM GOAL #2   Title Pt pain level to be no greater than a 4/10 to improve quality of life    Time 2   Period Weeks  PT SHORT TERM GOAL #3   Title Pt AROM for dorsiflexion to be to neutral in order to allow pt to be able to wear flat shoes    Time 2   Period Weeks           PT Long Term Goals - 01/09/16 1003    PT LONG TERM GOAL #1   Title Pt to be I in advance HEP in order to return to normal funcitoning status    Time 4   Period Weeks   PT LONG TERM GOAL #2   Title Pt pain level to be no greater than a 2/10 80% of the day for improved quality of life    Time 4   Period Weeks   PT LONG TERM GOAL #3   Title Pt dorsiflexion to be to 5 degrees to allow pt to have a normalized heel toe gait pattern for decrease stress on body structures.    Time 4   Period Weeks   PT LONG TERM GOAL #4   Title Pt strength of LE to be at least 4+/5 to stop patient from compensatory techniques aggrevating her pain    Time 4   Period Weeks               Plan - 01/09/16 [id number]    Clinical Impression Statement Ms. Mallik is a 89 y0 female who states that she has had mm cramps and tightness in her Rt leg for years.  She states she always felt that her Rt leg felt different than her Lt.  She sprain her Rt ankle in mid December and has been having significant pain and difficulty with walking ever since.  She has been referred to skilled PT to address these issues.  Examination demonstrates significant atrophy of the gastroc soleus complex, decrease ROM, decreased strength , decreased balance, increased pain and decreased activity tolerance.  Ms. Maresh will benefit from skilled PT to address these issues and return her to maximal functional activity.    Pt will benefit from skilled therapeutic intervention in order to improve on the following deficits Abnormal gait;Decreased activity tolerance;Decreased balance;Decreased mobility;Decreased range of motion;Decreased strength;Difficulty walking;Increased fascial restricitons;Pain   Rehab Potential Good   PT Frequency 3x / week   PT Duration 4 weeks   PT  Treatment/Interventions Patient/family education;Gait training;Stair training;Functional mobility training;Therapeutic activities;Therapeutic exercise;Balance training;Manual techniques;Ultrasound   PT Next Visit Plan Treatment will focus on regaining dosriflexion first with manua, Ultrasound and stretching.  Once ROM has improved begin balance and strengthining to maximize pt activity level and comfort.    PT Home Exercise Plan HEP    Consulted and Agree with Plan of Care Patient         Problem List Patient Active Problem List   Diagnosis Date Noted  . Upper airway cough syndrome 04/29/2015  . Essential hypertension 04/13/2015  . Hoarseness 04/13/2015  . Low TSH level 07/30/2014  . Neck discomfort 07/30/2014  . Goiter ? right  07/30/2014  . Trouble swallowing hx of  07/30/2014  . Visit for preventive health examination 07/14/2014  . GERD (gastroesophageal reflux disease) 01/01/2014  . Hypokalemia 12/18/2013  . Shortness of breath 12/18/2013  . Dyspnea 11/25/2013  . Complete tear of right rotator cuff 11/03/2013  . Right shoulder pain 06/30/2013  . Leg pain 05/08/2012  . Chest pressure "fatigue" 05/08/2012  . Sleep disturbance, unspecified 05/08/2012  . Skin cyst 07/16/2011  . Cyst 07/16/2011  . Ear pain 07/02/2011  . HEPATIC  CYST 09/12/2009  . ADVERSE REACTION TO MEDICATION 09/12/2009  . BACK PAIN, THORACIC REGION, LEFT 03/28/2009  . CAD, NATIVE VESSEL 12/26/2008  . CHEST PAIN, ATYPICAL 12/02/2008  . NAUSEA AND VOMITING 02/16/2008  . INTERNAL HEMORRHOIDS 01/09/2008  . EXTERNAL HEMORRHOIDS 01/09/2008  . LIPOMA NOS 09/16/2007  . HYPERLIPIDEMIA 09/16/2007    Rayetta Humphrey, PT CLT [phone] 01/09/2016, 10:12 AM  [place], [place], [address] Phone: [phone]   Fax:  [phone]  Name: MARVETTE FIGUERO MRN: [id number] Date of Birth: July 22, 1951

## 2016-01-10 NOTE — Addendum Note (Signed)
Addended by: Leeroy Cha on: 01/10/2016 04:27 PM   Modules accepted: Orders

## 2016-01-11 ENCOUNTER — Ambulatory Visit (HOSPITAL_COMMUNITY): Payer: 59

## 2016-01-11 DIAGNOSIS — M25671 Stiffness of right ankle, not elsewhere classified: Secondary | ICD-10-CM

## 2016-01-11 DIAGNOSIS — R269 Unspecified abnormalities of gait and mobility: Secondary | ICD-10-CM

## 2016-01-11 DIAGNOSIS — M25571 Pain in right ankle and joints of right foot: Secondary | ICD-10-CM | POA: Diagnosis not present

## 2016-01-11 DIAGNOSIS — R29898 Other symptoms and signs involving the musculoskeletal system: Secondary | ICD-10-CM

## 2016-01-11 NOTE — Therapy (Signed)
[place], [place], [address] Phone: [phone]   Fax:  [phone]  Physical Therapy Treatment  Patient Details  Name: Kaylee Adams MRN: [id number] Date of Birth: 1951-11-05 Referring Provider: Rush Farmer  Encounter Date: 01/11/2016      PT End of Session - 01/11/16 1656    Visit Number 2   Number of Visits 12   Date for PT Re-Evaluation 02/08/16   Authorization Type UMR    Authorization - Visit Number 2   Authorization - Number of Visits 10   PT Start Time [id number]   PT Stop Time 1735   PT Time Calculation (min) 45 min   Activity Tolerance Patient tolerated treatment well   Behavior During Therapy [facility] for tasks assessed/performed      Past Medical History  Diagnosis Date  . HLD (hyperlipidemia)   . HTN (hypertension)   . CAD (coronary artery disease)     a. 11/2005 Cath: nonobs dzs, 30% LAD lesion on cath ;  b. 11/2005 Echo: NL EF;  c. 04/2012 Echo: EF 55-60%, Gr 2 DD;  d. 04/2012 Ex MV: EF 72%, ST depression in recovery with NL perfusion imaging - HTN response to exercise.  . Hepatic cyst     on ct [place]  . Midsternal chest pain     cardiology eval with non-ischemic stress test 04/2012    Past Surgical History  Procedure Laterality Date  . Vesicovaginal fistula closure w/ tah      for fibroid tumors  . Breast biopsy    . Cholecystectomy    . Child birth x 5    . Abdominal hysterectomy      fibroid tumors  . Shoulder arthroscopy with rotator cuff repair and subacromial decompression Right 11/03/2013    Procedure: RIGHT SHOULDER ARTHROSCOPY WITH DEBRIDEMENT, ROTATOR CUFF REPAIR AND SUBACROMIAL DECOMPRESSION, DISTAL CLAVICLE RESECTION;  Surgeon: Mcarthur Rossetti, MD;  Location: [facility];  Service: Orthopedics;  Laterality: Right;    There were no vitals filed for this visit.  Visit Diagnosis:  Stiffness of ankle joint, right  Abnormality of gait  Right leg weakness  Pain in joint, ankle and foot,  right      Subjective Assessment - 01/11/16 1649    Subjective Pt stated ankle is sore today, no real pain.  Reports compliance with HEP 2x a day.   Pertinent History HTN   Patient Stated Goals less pain    Currently in Pain? No/denies   Pain Descriptors / Indicators Sore             [facility] Adult PT Treatment/Exercise - 01/11/16 0001    Knee/Hip Exercises: Standing   Heel Raises --   Heel Raises Limitations Toe raises 10x   Rocker Board 2 minutes   Rocker Board Limitations R/L and A/P   Gait Training Gait training with cueing for heel to toe pattern and equal stance phase   Knee/Hip Exercises: Supine   Straight Leg Raises 10 reps   Straight Leg Raises Limitations HEP   Knee/Hip Exercises: Sidelying   Hip ABduction Both;10 reps   Hip ABduction Limitations HEP   Knee/Hip Exercises: Prone   Hamstring Curl 10 reps   Hamstring Curl Limitations HEP   Hip Extension 10 reps   Hip Extension Limitations HEP   Manual Therapy   Manual Therapy Soft tissue mobilization;Passive ROM   Manual therapy comments Prone at beginning of session   Soft tissue mobilization Gastroc/soleus complex to reduce  tightness   Passive ROM 5x 30" for dorsiflexin   Ankle Exercises: Stretches   Gastroc Stretch 1 rep;30 seconds  standard against wall   Slant Board Stretch 3 reps;30 seconds            PT Short Term Goals - 01/09/16 1002    PT SHORT TERM GOAL #1   Title Pt to be I in HEP in order to meet goals on a timely basis    Time 2   Period Weeks   PT SHORT TERM GOAL #2   Title Pt pain level to be no greater than a 4/10 to improve quality of life    Time 2   Period Weeks   PT SHORT TERM GOAL #3   Title Pt AROM for dorsiflexion to be to neutral in order to allow pt to be able to wear flat shoes    Time 2   Period Weeks           PT Long Term Goals - 01/09/16 1003    PT LONG TERM GOAL #1   Title Pt to be I in advance HEP in order to return to normal funcitoning status    Time 4    Period Weeks   PT LONG TERM GOAL #2   Title Pt pain level to be no greater than a 2/10 80% of the day for improved quality of life    Time 4   Period Weeks   PT LONG TERM GOAL #3   Title Pt dorsiflexion to be to 5 degrees to allow pt to have a normalized heel toe gait pattern for decrease stress on body structures.    Time 4   Period Weeks   PT LONG TERM GOAL #4   Title Pt strength of LE to be at least 4+/5 to stop patient from compensatory techniques aggrevating her pain    Time 4   Period Weeks               Plan - 01/11/16 1658    Clinical Impression Statement Reviewed goals, compliance and technique with HEP and copy of evaluation given to pt.  Initial session focus on improving ankle mobility with stretches, PROM and manual  Held modalities due to no c/o pain ankle pain this session.  Gait training complete to improve gait mechanics with cueing for heel to toe and equal stance phase.  End of session manual soft tissue mobilization techniques were complete to reduce tightness gastroc/soleus complex and PROM.  Noted improved AROM to 14 degrees lacking neutral dorsiflexion.   PT Next Visit Plan Session focus on improving ankle dorsiflexion.  Begin treatment with soft tissue mobilitiy/PROM, stretches and [place] (if needed).  Once ROM has improved begin balance and strenghtening to maximize pt activity level and comfort.        Problem List Patient Active Problem List   Diagnosis Date Noted  . Upper airway cough syndrome 04/29/2015  . Essential hypertension 04/13/2015  . Hoarseness 04/13/2015  . Low TSH level 07/30/2014  . Neck discomfort 07/30/2014  . Goiter ? right  07/30/2014  . Trouble swallowing hx of  07/30/2014  . Visit for preventive health examination 07/14/2014  . GERD (gastroesophageal reflux disease) 01/01/2014  . Hypokalemia 12/18/2013  . Shortness of breath 12/18/2013  . Dyspnea 11/25/2013  . Complete tear of right rotator cuff 11/03/2013  . Right shoulder  pain 06/30/2013  . Leg pain 05/08/2012  . Chest pressure "fatigue" 05/08/2012  . Sleep disturbance, unspecified  05/08/2012  . Skin cyst 07/16/2011  . Cyst 07/16/2011  . Ear pain 07/02/2011  . HEPATIC CYST 09/12/2009  . ADVERSE REACTION TO MEDICATION 09/12/2009  . BACK PAIN, THORACIC REGION, LEFT 03/28/2009  . CAD, NATIVE VESSEL 12/26/2008  . CHEST PAIN, ATYPICAL 12/02/2008  . NAUSEA AND VOMITING 02/16/2008  . INTERNAL HEMORRHOIDS 01/09/2008  . EXTERNAL HEMORRHOIDS 01/09/2008  . LIPOMA NOS 09/16/2007  . HYPERLIPIDEMIA 09/16/2007   Ihor Austin, [place]; [place]  Aldona Lento 01/11/2016, [place] [address] [place], [place], [address] Phone: [phone]   Fax:  [phone]  Name: Kaylee Adams MRN: [id number] Date of Birth: 1951/05/28

## 2016-01-17 ENCOUNTER — Encounter (HOSPITAL_COMMUNITY): Payer: Self-pay

## 2016-01-17 ENCOUNTER — Ambulatory Visit (HOSPITAL_COMMUNITY): Payer: 59

## 2016-01-17 DIAGNOSIS — M25571 Pain in right ankle and joints of right foot: Secondary | ICD-10-CM | POA: Diagnosis not present

## 2016-01-17 DIAGNOSIS — R29898 Other symptoms and signs involving the musculoskeletal system: Secondary | ICD-10-CM | POA: Diagnosis not present

## 2016-01-17 DIAGNOSIS — R269 Unspecified abnormalities of gait and mobility: Secondary | ICD-10-CM

## 2016-01-17 DIAGNOSIS — M25671 Stiffness of right ankle, not elsewhere classified: Secondary | ICD-10-CM

## 2016-01-17 NOTE — Therapy (Signed)
[place], [place], [address] Phone: [phone]   Fax:  [phone]  Physical Therapy Treatment  Patient Details  Name: Kaylee Adams MRN: [id number] Date of Birth: 1951/05/17 Referring Provider: Rush Farmer  Encounter Date: 01/17/2016      PT End of Session - 01/17/16 0829    Visit Number 3   Number of Visits 12   Date for PT Re-Evaluation 02/08/16   Authorization Type UMR    Authorization - Visit Number 3   Authorization - Number of Visits 10   PT Start Time 0800   PT Stop Time [id number]   PT Time Calculation (min) 44 min   Activity Tolerance Patient tolerated treatment well   Behavior During Therapy [facility] for tasks assessed/performed      Past Medical History  Diagnosis Date  . HLD (hyperlipidemia)   . HTN (hypertension)   . CAD (coronary artery disease)     a. 11/2005 Cath: nonobs dzs, 30% LAD lesion on cath ;  b. 11/2005 Echo: NL EF;  c. 04/2012 Echo: EF 55-60%, Gr 2 DD;  d. 04/2012 Ex MV: EF 72%, ST depression in recovery with NL perfusion imaging - HTN response to exercise.  . Hepatic cyst     on ct [place]  . Midsternal chest pain     cardiology eval with non-ischemic stress test 04/2012    Past Surgical History  Procedure Laterality Date  . Vesicovaginal fistula closure w/ tah      for fibroid tumors  . Breast biopsy    . Cholecystectomy    . Child birth x 5    . Abdominal hysterectomy      fibroid tumors  . Shoulder arthroscopy with rotator cuff repair and subacromial decompression Right 11/03/2013    Procedure: RIGHT SHOULDER ARTHROSCOPY WITH DEBRIDEMENT, ROTATOR CUFF REPAIR AND SUBACROMIAL DECOMPRESSION, DISTAL CLAVICLE RESECTION;  Surgeon: Mcarthur Rossetti, MD;  Location: [place];  Service: Orthopedics;  Laterality: Right;    There were no vitals filed for this visit.  Visit Diagnosis:  Stiffness of ankle joint, right  Abnormality of gait  Right leg weakness  Pain in joint, ankle and foot,  right      Subjective Assessment - 01/17/16 0802    Subjective Pt denied pain upon arrival while in seated position. R ankle pain has ranged between a 0-2/10 on a VAS. No changes reported per pt since last PT visit. Pt reports that her c/c includes minor R ankle pain and weakness.    Pertinent History HTN   Limitations Walking   How long can you walk comfortably? 30 minutes    Patient Stated Goals Pt's goal is to improve her R ankle strength and reduce pain to a 0/10 on a VAS.    Currently in Pain? No/denies   Pain Score 0-No pain   Pain Location Ankle   Pain Orientation Right;Lateral   Pain Descriptors / Indicators Sore   Pain Type Chronic pain   Pain Onset More than a month ago   Pain Frequency Occasional   Multiple Pain Sites No                         [facility] Adult PT Treatment/Exercise - 01/17/16 0001    Knee/Hip Exercises: Stretches   Gastroc Stretch --   Manual Therapy   Manual Therapy Soft tissue mobilization;Other (comment)  cross friction to R peroneal mm at distal insertion x 90 sec  Manual therapy comments Prone at beginning of session   Soft tissue mobilization Gastroc/soleus complex to reduce tightness  with use of palmar and finger spreading technique   Passive ROM x 20 reps with 5 sec hold    Other Manual Therapy ant and psot R talocrural joint mob using grades I-III, x 5 reps each   Ankle Exercises: Seated   ABC's 2 reps   Ankle Circles/Pumps AROM;Right   Ankle Exercises: Stretches   Soleus Stretch 3 reps;30 seconds;Other (comment)  manual stretch- R ankle    Gastroc Stretch 3 reps;30 seconds;Other (comment)  manual stretch- R ankle    Slant Board Stretch 3 reps;30 seconds   Ankle Exercises: Supine   T-Band 1 set of 15 reps with use fo red TB= R ankle 4-way                PT Education - 01/17/16 0827    Education provided Yes   Education Details educated pt on HEP, proper shoe wear, and use of moist heat (15-20 minutes, 3-4x/day  to calf region prior to stretching)    Person(s) Educated Patient   Methods Explanation;Demonstration   Comprehension Returned demonstration;Verbalized understanding          PT Short Term Goals - 01/09/16 1002    PT SHORT TERM GOAL #1   Title Pt to be I in HEP in order to meet goals on a timely basis    Time 2   Period Weeks   PT SHORT TERM GOAL #2   Title Pt pain level to be no greater than a 4/10 to improve quality of life    Time 2   Period Weeks   PT SHORT TERM GOAL #3   Title Pt AROM for dorsiflexion to be to neutral in order to allow pt to be able to wear flat shoes    Time 2   Period Weeks           PT Long Term Goals - 01/09/16 1003    PT LONG TERM GOAL #1   Title Pt to be I in advance HEP in order to return to normal funcitoning status    Time 4   Period Weeks   PT LONG TERM GOAL #2   Title Pt pain level to be no greater than a 2/10 80% of the day for improved quality of life    Time 4   Period Weeks   PT LONG TERM GOAL #3   Title Pt dorsiflexion to be to 5 degrees to allow pt to have a normalized heel toe gait pattern for decrease stress on body structures.    Time 4   Period Weeks   PT LONG TERM GOAL #4   Title Pt strength of LE to be at least 4+/5 to stop patient from compensatory techniques aggrevating her pain    Time 4   Period Weeks               Plan - 01/17/16 [id number]    Clinical Impression Statement Reviewed current HEP with good understanding demo and verbalized per pt. Progressed and added R ankle 4-way with red thera-band. Pt c/o minor "soreness" at the lateral aspect of the R foot/ankle at the insertion of the peroneal mm. Completed STM/MFR to R gastroc-soleus and cross friction to R peroneal mm at distal insertion point with good tolerance reported. Less palpable tenderness reported post manual techniques. R ankle DF continues to be most limited. Added ant/post talocrural jt mobs  in order to improve ankle mobility using grades I-III. R  ankle pain remained at baseline levels at completion of PT tx. Pt is responding and tolerating current PT tx well with no adverse effects assessed. Pt would benefit from continued skilled PT in order to further improve R ankle stabilization/strength, ROM into DF, gastroc-soleus flexibility, and jt mobility.    Pt will benefit from skilled therapeutic intervention in order to improve on the following deficits Abnormal gait;Decreased activity tolerance;Decreased balance;Decreased mobility;Decreased range of motion;Decreased strength;Difficulty walking;Increased fascial restricitons;Pain;Hypomobility   Rehab Potential Good   PT Frequency 3x / week   PT Duration 4 weeks   PT Treatment/Interventions Patient/family education;Gait training;Stair training;Functional mobility training;Therapeutic activities;Therapeutic exercise;Balance training;Manual techniques;Ultrasound;Passive range of motion   PT Next Visit Plan Continue with current POC with focus on gastroc-soleus stretching, talocrural joint mobs to improve DF, ankle OKC strengthening ther ex with TB, and modalities as need for pain. Once ROM has improved begin balance and strengthening to maximize pt activity level and comfort   PT Home Exercise Plan Reviewed HEP with plan to update at next PT visit with addition of ankle 4-way with TB   Consulted and Agree with Plan of Care Patient        Problem List Patient Active Problem List   Diagnosis Date Noted  . Upper airway cough syndrome 04/29/2015  . Essential hypertension 04/13/2015  . Hoarseness 04/13/2015  . Low TSH level 07/30/2014  . Neck discomfort 07/30/2014  . Goiter ? right  07/30/2014  . Trouble swallowing hx of  07/30/2014  . Visit for preventive health examination 07/14/2014  . GERD (gastroesophageal reflux disease) 01/01/2014  . Hypokalemia 12/18/2013  . Shortness of breath 12/18/2013  . Dyspnea 11/25/2013  . Complete tear of right rotator cuff 11/03/2013  . Right shoulder pain  06/30/2013  . Leg pain 05/08/2012  . Chest pressure "fatigue" 05/08/2012  . Sleep disturbance, unspecified 05/08/2012  . Skin cyst 07/16/2011  . Cyst 07/16/2011  . Ear pain 07/02/2011  . HEPATIC CYST 09/12/2009  . ADVERSE REACTION TO MEDICATION 09/12/2009  . BACK PAIN, THORACIC REGION, LEFT 03/28/2009  . CAD, NATIVE VESSEL 12/26/2008  . CHEST PAIN, ATYPICAL 12/02/2008  . NAUSEA AND VOMITING 02/16/2008  . INTERNAL HEMORRHOIDS 01/09/2008  . EXTERNAL HEMORRHOIDS 01/09/2008  . LIPOMA NOS 09/16/2007  . HYPERLIPIDEMIA 09/16/2007    Garen Lah, PT, DPT   01/17/2016, 11:23 AM  [place], [place], [address] Phone: [phone]   Fax:  [phone]  Name: GENEVY JULIANA MRN: [id number] Date of Birth: 10-May-1951

## 2016-01-20 ENCOUNTER — Ambulatory Visit (HOSPITAL_COMMUNITY): Payer: 59

## 2016-01-23 ENCOUNTER — Encounter (HOSPITAL_COMMUNITY): Payer: Self-pay

## 2016-01-23 ENCOUNTER — Ambulatory Visit (HOSPITAL_COMMUNITY): Payer: 59

## 2016-01-23 DIAGNOSIS — M25571 Pain in right ankle and joints of right foot: Secondary | ICD-10-CM

## 2016-01-23 DIAGNOSIS — R29898 Other symptoms and signs involving the musculoskeletal system: Secondary | ICD-10-CM | POA: Diagnosis not present

## 2016-01-23 DIAGNOSIS — M25671 Stiffness of right ankle, not elsewhere classified: Secondary | ICD-10-CM | POA: Diagnosis not present

## 2016-01-23 DIAGNOSIS — R269 Unspecified abnormalities of gait and mobility: Secondary | ICD-10-CM

## 2016-01-23 NOTE — Patient Instructions (Signed)
  Ankle Plantarflexion  Start: Place theraband around involved foot as pictured.  Movement: Press into the band against the resistance (as if you are pressing the gas pedal) control the movement back to start.   Ankle Eversion  Start: Place theraband around your involved foot, stabilize the band with your non-involved foot as pictured.  Movement: Pull your involved foot against the resistance of the band and control the movement back to start.    Ankle Strength - Tubing Dorsiflexion   Start with your toes pointed away from your body. Move your toes back towards your body. The tubing should be resisting your movement. Return slowly to the starting position.   Complete all of the exercises above with the red thera-band for 1 set of 15-20 reps, 1x/day, 5-7 days/week

## 2016-01-23 NOTE — Therapy (Signed)
[place], [place], [address] Phone: [phone]   Fax:  [phone]  Physical Therapy Treatment  Patient Details  Name: Kaylee Adams MRN: [id number] Date of Birth: [birth year] Referring Provider: Rush Farmer  Encounter Date: 01/23/2016      PT End of Session - 01/23/16 0828    Visit Number 4   Number of Visits 12   Date for PT Re-Evaluation 02/08/16   Authorization Type UMR    Authorization - Visit Number 4   Authorization - Number of Visits 10   PT Start Time [id number]   PT Stop Time [id number]   PT Time Calculation (min) 45 min   Activity Tolerance Patient tolerated treatment well;No increased pain   Behavior During Therapy [facility] for tasks assessed/performed      Past Medical History  Diagnosis Date  . HLD (hyperlipidemia)   . HTN (hypertension)   . CAD (coronary artery disease)     a. 11/2005 Cath: nonobs dzs, 30% LAD lesion on cath ;  b. 11/2005 Echo: NL EF;  c. 04/2012 Echo: EF 55-60%, Gr 2 DD;  d. 04/2012 Ex MV: EF 72%, ST depression in recovery with NL perfusion imaging - HTN response to exercise.  . Hepatic cyst     on ct [place]  . Midsternal chest pain     cardiology eval with non-ischemic stress test 04/2012    Past Surgical History  Procedure Laterality Date  . Vesicovaginal fistula closure w/ tah      for fibroid tumors  . Breast biopsy    . Cholecystectomy    . Child birth x 5    . Abdominal hysterectomy      fibroid tumors  . Shoulder arthroscopy with rotator cuff repair and subacromial decompression Right 11/03/2013    Procedure: RIGHT SHOULDER ARTHROSCOPY WITH DEBRIDEMENT, ROTATOR CUFF REPAIR AND SUBACROMIAL DECOMPRESSION, DISTAL CLAVICLE RESECTION;  Surgeon: Mcarthur Rossetti, MD;  Location: [place];  Service: Orthopedics;  Laterality: Right;    There were no vitals filed for this visit.  Visit Diagnosis:  Stiffness of ankle joint, right  Abnormality of gait  Right leg weakness  Pain in  joint, ankle and foot, right      Subjective Assessment - 01/23/16 0801    Subjective Pt denied pain upon arrival while in seated position and noted less frequent pain since last PT visit. R ankle pain has ranged between a 0-2/10 on a VAS, which mainly increases with prolonged standing or walking > 1 hour.   Pertinent History HTN   Limitations Walking;Standing   How long can you sit comfortably? as long as she wants she might have pain    How long can you walk comfortably? 45 minutes    Patient Stated Goals Pt's goal is to improve her R ankle strength and reduce pain to a 0/10 on a VAS.    Currently in Pain? No/denies   Pain Score 0-No pain   Pain Location Ankle   Pain Orientation Right;Lateral   Pain Descriptors / Indicators Sore   Pain Type Chronic pain   Pain Onset More than a month ago   Pain Frequency Occasional   Multiple Pain Sites No                         [facility] Adult PT Treatment/Exercise - 01/23/16 0001    Manual Therapy   Manual Therapy Soft tissue mobilization;Other (comment)   Manual therapy comments  Prone at beginning of session   Soft tissue mobilization Gastroc/soleus complex to reduce tightness   Passive ROM x 20 reps with 5 sec hold    Other Manual Therapy ant and post R talocrural joint mob using grades I-III, x 5 reps each   Ankle Exercises: Stretches   Soleus Stretch 3 reps;30 seconds;Other (comment)   Gastroc Stretch 3 reps;30 seconds;Other (comment)   Slant Board Stretch 3 reps;30 seconds   Ankle Exercises: Supine   T-Band 1 set of 15 reps with use fo red TB= R ankle 4-way   Ankle Exercises: Seated   Ankle Circles/Pumps AROM;Right;15 reps                PT Education - 01/23/16 0825    Education provided Yes   Education Details Educated pt on current HEP, proper streching parameters and set-up, use of moist heat to assist with soreness, and proper shoewear   Person(s) Educated Patient   Methods Explanation;Demonstration    Comprehension Verbalized understanding;Returned demonstration;Verbal cues required;Need further instruction          PT Short Term Goals - 01/09/16 1002    PT SHORT TERM GOAL #1   Title Pt to be I in HEP in order to meet goals on a timely basis    Time 2   Period Weeks   PT SHORT TERM GOAL #2   Title Pt pain level to be no greater than a 4/10 to improve quality of life    Time 2   Period Weeks   PT SHORT TERM GOAL #3   Title Pt AROM for dorsiflexion to be to neutral in order to allow pt to be able to wear flat shoes    Time 2   Period Weeks           PT Long Term Goals - 01/09/16 1003    PT LONG TERM GOAL #1   Title Pt to be I in advance HEP in order to return to normal funcitoning status    Time 4   Period Weeks   PT LONG TERM GOAL #2   Title Pt pain level to be no greater than a 2/10 80% of the day for improved quality of life    Time 4   Period Weeks   PT LONG TERM GOAL #3   Title Pt dorsiflexion to be to 5 degrees to allow pt to have a normalized heel toe gait pattern for decrease stress on body structures.    Time 4   Period Weeks   PT LONG TERM GOAL #4   Title Pt strength of LE to be at least 4+/5 to stop patient from compensatory techniques aggrevating her pain    Time 4   Period Weeks               Plan - 01/23/16 [id number]    Clinical Impression Statement Pt progressed through ther ex regimen well without exacerbating symptoms. Added R ankle 3-way to HEP with handout provided. Completed STM/MFR to R gastroc-soleus with min tender points assessed at lateral head of gastroc, which was reduced easily with STM/MFR. Pt continues to present with limited talocrural jt mobility. Completed joint mobs this visit with no significant changes noted. Most deficits assessed with R ankle DF. Pt is making steady progress towards stated goals with less severe and frequent R lateral ankle pain. Pt would benefit from continued skilled PT to address strength, ROM, and jt mobility  deficits. Plan to initiate R ankle  CKC stabilization ther ex at next PT visit.    Pt will benefit from skilled therapeutic intervention in order to improve on the following deficits Abnormal gait;Decreased activity tolerance;Decreased balance;Decreased mobility;Decreased range of motion;Decreased strength;Difficulty walking;Increased fascial restricitons;Pain;Hypomobility   Rehab Potential Good   PT Frequency 3x / week   PT Duration 4 weeks   PT Treatment/Interventions Patient/family education;Gait training;Stair training;Functional mobility training;Therapeutic activities;Therapeutic exercise;Balance training;Manual techniques;Ultrasound;Passive range of motion   PT Next Visit Plan Continue with current POC with focus on gastroc-soleus stretching, talocrural joint mobs to improve DF, ankle OKC strengthening ther ex with TB, and modalities as need for pain. Once ROM has improved begin balance and strengthening to maximize pt activity level and comfort   PT Home Exercise Plan Reviewed HEP with addition of ankle 3-way ( DF, PF,eversion) with red TB with updated handout provided this visit    Consulted and Agree with Plan of Care Patient        Problem List Patient Active Problem List   Diagnosis Date Noted  . Upper airway cough syndrome 04/29/2015  . Essential hypertension 04/13/2015  . Hoarseness 04/13/2015  . Low TSH level 07/30/2014  . Neck discomfort 07/30/2014  . Goiter ? right  07/30/2014  . Trouble swallowing hx of  07/30/2014  . Visit for preventive health examination 07/14/2014  . GERD (gastroesophageal reflux disease) 01/01/2014  . Hypokalemia 12/18/2013  . Shortness of breath 12/18/2013  . Dyspnea 11/25/2013  . Complete tear of right rotator cuff 11/03/2013  . Right shoulder pain 06/30/2013  . Leg pain 05/08/2012  . Chest pressure "fatigue" 05/08/2012  . Sleep disturbance, unspecified 05/08/2012  . Skin cyst 07/16/2011  . Cyst 07/16/2011  . Ear pain 07/02/2011  . HEPATIC  CYST 09/12/2009  . ADVERSE REACTION TO MEDICATION 09/12/2009  . BACK PAIN, THORACIC REGION, LEFT 03/28/2009  . CAD, NATIVE VESSEL 12/26/2008  . CHEST PAIN, ATYPICAL 12/02/2008  . NAUSEA AND VOMITING 02/16/2008  . INTERNAL HEMORRHOIDS 01/09/2008  . EXTERNAL HEMORRHOIDS 01/09/2008  . LIPOMA NOS 09/16/2007  . HYPERLIPIDEMIA 09/16/2007   Garen Lah, PT, DPT    01/23/2016, 12:07 PM  [place], [place], [address] Phone: [phone]   Fax:  [phone]  Name: NESSIE HOBERMAN MRN: [id number] Date of Birth: [birth year]

## 2016-01-25 ENCOUNTER — Ambulatory Visit (HOSPITAL_COMMUNITY): Payer: 59 | Attending: Orthopaedic Surgery | Admitting: Physical Therapy

## 2016-01-25 DIAGNOSIS — R269 Unspecified abnormalities of gait and mobility: Secondary | ICD-10-CM | POA: Insufficient documentation

## 2016-01-25 DIAGNOSIS — M25671 Stiffness of right ankle, not elsewhere classified: Secondary | ICD-10-CM | POA: Insufficient documentation

## 2016-01-25 DIAGNOSIS — R29898 Other symptoms and signs involving the musculoskeletal system: Secondary | ICD-10-CM | POA: Insufficient documentation

## 2016-01-25 DIAGNOSIS — M25571 Pain in right ankle and joints of right foot: Secondary | ICD-10-CM | POA: Diagnosis not present

## 2016-01-25 NOTE — Therapy (Signed)
[place], [place], [address] Phone: [phone]   Fax:  [phone]  Physical Therapy Treatment  Patient Details  Name: Kaylee Adams MRN: [id number] Date of Birth: 12/11/1951 Referring Provider: Rush Farmer  Encounter Date: 01/25/2016      PT End of Session - 01/25/16 0841    Visit Number 5   Number of Visits 12   Date for PT Re-Evaluation 02/08/16   Authorization Type UMR    Authorization - Visit Number 5   Authorization - Number of Visits 10   PT Start Time 0800   PT Stop Time [id number]   PT Time Calculation (min) 38 min   Activity Tolerance Patient tolerated treatment well;No increased pain   Behavior During Therapy [facility] for tasks assessed/performed      Past Medical History  Diagnosis Date  . HLD (hyperlipidemia)   . HTN (hypertension)   . CAD (coronary artery disease)     a. 11/2005 Cath: nonobs dzs, 30% LAD lesion on cath ;  b. 11/2005 Echo: NL EF;  c. 04/2012 Echo: EF 55-60%, Gr 2 DD;  d. 04/2012 Ex MV: EF 72%, ST depression in recovery with NL perfusion imaging - HTN response to exercise.  . Hepatic cyst     on ct [place]  . Midsternal chest pain     cardiology eval with non-ischemic stress test 04/2012    Past Surgical History  Procedure Laterality Date  . Vesicovaginal fistula closure w/ tah      for fibroid tumors  . Breast biopsy    . Cholecystectomy    . Child birth x 5    . Abdominal hysterectomy      fibroid tumors  . Shoulder arthroscopy with rotator cuff repair and subacromial decompression Right 11/03/2013    Procedure: RIGHT SHOULDER ARTHROSCOPY WITH DEBRIDEMENT, ROTATOR CUFF REPAIR AND SUBACROMIAL DECOMPRESSION, DISTAL CLAVICLE RESECTION;  Surgeon: Mcarthur Rossetti, MD;  Location: [place];  Service: Orthopedics;  Laterality: Right;    There were no vitals filed for this visit.  Visit Diagnosis:  Stiffness of ankle joint, right  Abnormality of gait  Right leg weakness  Pain in joint,  ankle and foot, right      Subjective Assessment - 01/25/16 0817    Subjective PT states she doesn't have any pain just can feel it with certain movements, i.e eversion and increases to 2/10 when this occurs.     Currently in Pain? No/denies                         [facility] Adult PT Treatment/Exercise - 01/25/16 0818    Knee/Hip Exercises: Standing   Rocker Board 2 minutes   Rocker Board Limitations R/L and A/P   Manual Therapy   Manual Therapy Soft tissue mobilization;Other (comment)   Manual therapy comments Prone at beginning of session   Soft tissue mobilization Gastroc/soleus complex to reduce tightness   Passive ROM x 20 reps with 5 sec hold    Other Manual Therapy ant and post R talocrural joint mob using grades I-III, x 5 reps each   Ankle Exercises: Supine   T-Band 1 set of 15 reps with use fo green TB= R ankle 4-way   Ankle Exercises: Stretches   Soleus Stretch 3 reps;30 seconds;Other (comment)   Gastroc Stretch 3 reps;30 seconds;Other (comment)   Ankle Exercises: Standing   SLS Rt:21" max of 3 no UE's   Heel Raises 10  reps   Toe Raise 10 reps                  PT Short Term Goals - 01/09/16 1002    PT SHORT TERM GOAL #1   Title Pt to be I in HEP in order to meet goals on a timely basis    Time 2   Period Weeks   PT SHORT TERM GOAL #2   Title Pt pain level to be no greater than a 4/10 to improve quality of life    Time 2   Period Weeks   PT SHORT TERM GOAL #3   Title Pt AROM for dorsiflexion to be to neutral in order to allow pt to be able to wear flat shoes    Time 2   Period Weeks           PT Long Term Goals - 01/09/16 1003    PT LONG TERM GOAL #1   Title Pt to be I in advance HEP in order to return to normal funcitoning status    Time 4   Period Weeks   PT LONG TERM GOAL #2   Title Pt pain level to be no greater than a 2/10 80% of the day for improved quality of life    Time 4   Period Weeks   PT LONG TERM GOAL #3    Title Pt dorsiflexion to be to 5 degrees to allow pt to have a normalized heel toe gait pattern for decrease stress on body structures.    Time 4   Period Weeks   PT LONG TERM GOAL #4   Title Pt strength of LE to be at least 4+/5 to stop patient from compensatory techniques aggrevating her pain    Time 4   Period Weeks               Plan - 01/25/16 [id number]    Clinical Impression Statement Focused on improving AROM of Rt ankle.  Very rigid with limited ROM.  Completed manual initially and progressed through exercises.  Increased to green theraband with verbal cues to control eccentric phase of contraction.  Noted weakness and tightness in Rt ankle but able to maintain 21 seconds of SLS without UE assist.     PT Next Visit Plan Continue with current POC with focus on gastroc-soleus stretching, talocrural joint mobs to improve DF, ankle OKC strengthening ther ex with TB, and modalities as need for pain. Once ROM has improved begin balance and strengthening to maximize pt activity level and comfort   Consulted and Agree with Plan of Care Patient        Problem List Patient Active Problem List   Diagnosis Date Noted  . Upper airway cough syndrome 04/29/2015  . Essential hypertension 04/13/2015  . Hoarseness 04/13/2015  . Low TSH level 07/30/2014  . Neck discomfort 07/30/2014  . Goiter ? right  07/30/2014  . Trouble swallowing hx of  07/30/2014  . Visit for preventive health examination 07/14/2014  . GERD (gastroesophageal reflux disease) 01/01/2014  . Hypokalemia 12/18/2013  . Shortness of breath 12/18/2013  . Dyspnea 11/25/2013  . Complete tear of right rotator cuff 11/03/2013  . Right shoulder pain 06/30/2013  . Leg pain 05/08/2012  . Chest pressure "fatigue" 05/08/2012  . Sleep disturbance, unspecified 05/08/2012  . Skin cyst 07/16/2011  . Cyst 07/16/2011  . Ear pain 07/02/2011  . HEPATIC CYST 09/12/2009  . ADVERSE REACTION TO MEDICATION 09/12/2009  . BACK PAIN,  THORACIC  REGION, LEFT 03/28/2009  . CAD, NATIVE VESSEL 12/26/2008  . CHEST PAIN, ATYPICAL 12/02/2008  . NAUSEA AND VOMITING 02/16/2008  . INTERNAL HEMORRHOIDS 01/09/2008  . EXTERNAL HEMORRHOIDS 01/09/2008  . LIPOMA NOS 09/16/2007  . HYPERLIPIDEMIA 09/16/2007    Teena Irani, PTA/CLT [id number]  01/25/2016, 8:46 AM  [place], [place], [address] Phone: [phone]   Fax:  [phone]  Name: KELLINA DONOVAN MRN: [id number] Date of Birth: 1951/05/02

## 2016-01-27 ENCOUNTER — Ambulatory Visit (HOSPITAL_COMMUNITY): Payer: 59 | Admitting: Physical Therapy

## 2016-01-27 DIAGNOSIS — M25671 Stiffness of right ankle, not elsewhere classified: Secondary | ICD-10-CM

## 2016-01-27 DIAGNOSIS — R29898 Other symptoms and signs involving the musculoskeletal system: Secondary | ICD-10-CM | POA: Diagnosis not present

## 2016-01-27 DIAGNOSIS — M25571 Pain in right ankle and joints of right foot: Secondary | ICD-10-CM | POA: Diagnosis not present

## 2016-01-27 DIAGNOSIS — R269 Unspecified abnormalities of gait and mobility: Secondary | ICD-10-CM | POA: Diagnosis not present

## 2016-01-27 NOTE — Therapy (Addendum)
[place], [place], [address] Phone: [phone]   Fax:  [phone]  Physical Therapy Treatment  Patient Details  Name: Kaylee Adams MRN: [id number] Date of Birth: July 12, 1951 Referring Provider: Rush Farmer  Encounter Date: 01/27/2016      PT End of Session - 01/27/16 0819    Visit Number 6   Number of Visits 12   Date for PT Re-Evaluation 02/08/16   Authorization Type UMR    Authorization - Visit Number 6   Authorization - Number of Visits 10   PT Start Time 0802   PT Stop Time 0845   PT Time Calculation (min) 43 min   Activity Tolerance Patient tolerated treatment well   Behavior During Therapy [facility] for tasks assessed/performed      Past Medical History  Diagnosis Date  . HLD (hyperlipidemia)   . HTN (hypertension)   . CAD (coronary artery disease)     a. 11/2005 Cath: nonobs dzs, 30% LAD lesion on cath ;  b. 11/2005 Echo: NL EF;  c. 04/2012 Echo: EF 55-60%, Gr 2 DD;  d. 04/2012 Ex MV: EF 72%, ST depression in recovery with NL perfusion imaging - HTN response to exercise.  . Hepatic cyst     on ct [place]  . Midsternal chest pain     cardiology eval with non-ischemic stress test 04/2012    Past Surgical History  Procedure Laterality Date  . Vesicovaginal fistula closure w/ tah      for fibroid tumors  . Breast biopsy    . Cholecystectomy    . Child birth x 5    . Abdominal hysterectomy      fibroid tumors  . Shoulder arthroscopy with rotator cuff repair and subacromial decompression Right 11/03/2013    Procedure: RIGHT SHOULDER ARTHROSCOPY WITH DEBRIDEMENT, ROTATOR CUFF REPAIR AND SUBACROMIAL DECOMPRESSION, DISTAL CLAVICLE RESECTION;  Surgeon: Mcarthur Rossetti, MD;  Location: [facility];  Service: Orthopedics;  Laterality: Right;    There were no vitals filed for this visit.  Visit Diagnosis:  Stiffness of ankle joint, right  Abnormality of gait  Right leg weakness  Pain in joint, ankle and foot,  right      Subjective Assessment - 01/27/16 0809    Subjective Pt states that she is not having as much pain in the morning.  Doing the exercises at home.    Pain Score 0-No pain                         [facility] Adult PT Treatment/Exercise - 01/27/16 0001    Exercises   Exercises Knee/Hip;Ankle   Knee/Hip Exercises: Stretches   Gastroc Stretch Right;3 reps;30 seconds   Gastroc Stretch Limitations slant board    Other Knee/Hip Stretches Plantar flexion  3x 30" max 22"    Knee/Hip Exercises: Standing   Heel Raises Both;10 reps   Heel Raises Limitations toe raises 10   Forward Lunges Right;10 reps   Forward Lunges Limitations on 6" box    Forward Step Up 10 reps   Rocker Board 2 minutes   Rocker Board Limitations R/L and A/P   SLS x 5    Manual Therapy   Manual Therapy Soft tissue mobilization;Other (comment)   Manual therapy comments Prone at beginning of session   Soft tissue mobilization Gastroc/soleus complex to reduce tightness   Other Manual Therapy ant and post R talocrural joint mob using grades I-III, x 5  reps each   Ankle Exercises: Standing   BAPS Standing;Level 3;10 reps   BAPS Limitations done dorsiflexion/plantarflexion; eversion/inversion and rotation.                   PT Short Term Goals - 01/09/16 1002    PT SHORT TERM GOAL #1   Title Pt to be Independent  in HEP in order to meet goals on a timely basis    Time 2   Period Weeks   PT SHORT TERM GOAL #2   Title Pt pain level to be no greater than a 4/10 to improve quality of life    Time 2   Period Weeks   PT SHORT TERM GOAL #3   Title Pt AROM for dorsiflexion to be to neutral in order to allow pt to be able to wear flat shoes    Time 2   Period Weeks           PT Long Term Goals - 01/09/16 1003    PT LONG TERM GOAL #1   Title Pt to be Independent  in advance HEP in order to return to normal funcitoning status    Time 4   Period Weeks   PT LONG TERM GOAL #2   Title Pt  pain level to be no greater than a 2/10 80% of the day for improved quality of life    Time 4   Period Weeks   PT LONG TERM GOAL #3   Title Pt dorsiflexion to be to 5 degrees to allow pt to have a normalized heel toe gait pattern for decrease stress on body structures.    Time 4   Period Weeks   PT LONG TERM GOAL #4   Title Pt strength of LE to be at least 4+/5 to stop patient from compensatory techniques aggrevating her pain    Time 4   Period Weeks               Plan - 01/27/16 0820    Clinical Impression Statement Pt improving in pain and ROM; added Baps, lunges and step up for improved ROM and strengtheing noted mm fatigue with strengtheing exercises.  PT continues to need skilled therapy due to deficits of ROM, strength and fascial restrictions.    Pt will benefit from skilled therapeutic intervention in order to improve on the following deficits Abnormal gait;Decreased activity tolerance;Decreased balance;Decreased mobility;Decreased range of motion;Decreased strength;Difficulty walking;Increased fascial restricitons;Pain;Hypomobility   Rehab Potential Good   PT Next Visit Plan Continue to add functional strengthening aloing with stretching to program.          Problem List Patient Active Problem List   Diagnosis Date Noted  . Upper airway cough syndrome 04/29/2015  . Essential hypertension 04/13/2015  . Hoarseness 04/13/2015  . Low TSH level 07/30/2014  . Neck discomfort 07/30/2014  . Goiter ? right  07/30/2014  . Trouble swallowing hx of  07/30/2014  . Visit for preventive health examination 07/14/2014  . GERD (gastroesophageal reflux disease) 01/01/2014  . Hypokalemia 12/18/2013  . Shortness of breath 12/18/2013  . Dyspnea 11/25/2013  . Complete tear of right rotator cuff 11/03/2013  . Right shoulder pain 06/30/2013  . Leg pain 05/08/2012  . Chest pressure "fatigue" 05/08/2012  . Sleep disturbance, unspecified 05/08/2012  . Skin cyst 07/16/2011  . Cyst  07/16/2011  . Ear pain 07/02/2011  . HEPATIC CYST 09/12/2009  . ADVERSE REACTION TO MEDICATION 09/12/2009  . BACK PAIN, THORACIC REGION, LEFT  03/28/2009  . CAD, NATIVE VESSEL 12/26/2008  . CHEST PAIN, ATYPICAL 12/02/2008  . NAUSEA AND VOMITING 02/16/2008  . INTERNAL HEMORRHOIDS 01/09/2008  . EXTERNAL HEMORRHOIDS 01/09/2008  . LIPOMA NOS 09/16/2007  . HYPERLIPIDEMIA 09/16/2007  Rayetta Humphrey, PT CLT [phone] 01/27/2016, 8:47 AM  [place], [place], [address] Phone: [phone]   Fax:  [phone]  Name: AVONNA MESERVEY MRN: [id number] Date of Birth: 31-Mar-1951

## 2016-01-30 ENCOUNTER — Ambulatory Visit (HOSPITAL_COMMUNITY): Payer: 59 | Admitting: Physical Therapy

## 2016-01-30 DIAGNOSIS — M25671 Stiffness of right ankle, not elsewhere classified: Secondary | ICD-10-CM | POA: Diagnosis not present

## 2016-01-30 DIAGNOSIS — M25571 Pain in right ankle and joints of right foot: Secondary | ICD-10-CM | POA: Diagnosis not present

## 2016-01-30 DIAGNOSIS — R29898 Other symptoms and signs involving the musculoskeletal system: Secondary | ICD-10-CM | POA: Diagnosis not present

## 2016-01-30 DIAGNOSIS — R269 Unspecified abnormalities of gait and mobility: Secondary | ICD-10-CM | POA: Diagnosis not present

## 2016-01-30 NOTE — Therapy (Addendum)
[place], [place], [address] Phone: [phone]   Fax:  [phone]  Physical Therapy Treatment  Patient Details  Name: Kaylee Adams MRN: [id number] Date of Birth: May 19, 1951 Referring Provider: Rush Farmer  Encounter Date: 01/30/2016      PT End of Session - 01/30/16 0819    Visit Number 7   Number of Visits 12   Date for PT Re-Evaluation 02/08/16   Authorization Type UMR    Authorization - Visit Number 7   Authorization - Number of Visits 10   PT Start Time 0801   PT Stop Time 0845   PT Time Calculation (min) 44 min   Activity Tolerance Patient tolerated treatment well   Behavior During Therapy [facility] for tasks assessed/performed      Past Medical History  Diagnosis Date  . HLD (hyperlipidemia)   . HTN (hypertension)   . CAD (coronary artery disease)     a. 11/2005 Cath: nonobs dzs, 30% LAD lesion on cath ;  b. 11/2005 Echo: NL EF;  c. 04/2012 Echo: EF 55-60%, Gr 2 DD;  d. 04/2012 Ex MV: EF 72%, ST depression in recovery with NL perfusion imaging - HTN response to exercise.  . Hepatic cyst     on ct [place]  . Midsternal chest pain     cardiology eval with non-ischemic stress test 04/2012    Past Surgical History  Procedure Laterality Date  . Vesicovaginal fistula closure w/ tah      for fibroid tumors  . Breast biopsy    . Cholecystectomy    . Child birth x 5    . Abdominal hysterectomy      fibroid tumors  . Shoulder arthroscopy with rotator cuff repair and subacromial decompression Right 11/03/2013    Procedure: RIGHT SHOULDER ARTHROSCOPY WITH DEBRIDEMENT, ROTATOR CUFF REPAIR AND SUBACROMIAL DECOMPRESSION, DISTAL CLAVICLE RESECTION;  Surgeon: Mcarthur Rossetti, MD;  Location: [place];  Service: Orthopedics;  Laterality: Right;    There were no vitals filed for this visit.  Visit Diagnosis:  Stiffness of ankle joint, right  Abnormality of gait  Right leg weakness  Pain in joint, ankle and foot,  right      Subjective Assessment - 01/30/16 0804    Subjective PT is not having any pain at this time.    Currently in Pain? No/denies               [facility] Adult PT Treatment/Exercise - 01/30/16 0001    Exercises   Exercises Knee/Hip;Ankle   Knee/Hip Exercises: Stretches   Gastroc Stretch Right;3 reps;30 seconds   Gastroc Stretch Limitations slant board    Other Knee/Hip Stretches Plantar flexion  3x 30" max 22"    Knee/Hip Exercises: Standing   Heel Raises Both;15 reps   Heel Raises Limitations toe raises 10   Forward Lunges Right;15 reps   Forward Lunges Limitations on 6" box    Lateral Step Up Right;10 reps   Forward Step Up 15 reps   Functional Squat 10 reps   Rocker Board 2 minutes   Rocker Board Limitations R/L and A/P   SLS x 5    Manual Therapy   Manual Therapy Soft tissue mobilization;Other (comment)   Manual therapy comments Prone at beginning of session   Soft tissue mobilization Gastroc/soleus complex to reduce tightness   Other Manual Therapy ant and post R talocrural joint mob using grades I-III, x 5 reps each   Ankle Exercises: Standing  BAPS Standing;Level 3;10 reps   BAPS Limitations done dorsiflexion/plantarflexion; eversion/inversion and rotation.                 PT Education - 01/30/16 [phone]    Education provided Yes   Education Details Proper form for completing exercises    Person(s) Educated Patient   Methods Explanation          PT Short Term Goals - 01/09/16 1002    PT SHORT TERM GOAL #1   Title Pt to be independent  in HEP in order to meet goals on a timely basis    Time 2   Period Weeks   PT SHORT TERM GOAL #2   Title Pt pain level to be no greater than a 4/10 to improve quality of life    Time 2   Period Weeks   PT SHORT TERM GOAL #3   Title Pt AROM for dorsiflexion to be to neutral in order to allow pt to be able to wear flat shoes    Time 2   Period Weeks           PT Long Term Goals - 01/09/16 1003    PT  LONG TERM GOAL #1   Title Pt to be independent  in advance HEP in order to return to normal funcitoning status    Time 4   Period Weeks   PT LONG TERM GOAL #2   Title Pt pain level to be no greater than a 2/10 80% of the day for improved quality of life    Time 4   Period Weeks   PT LONG TERM GOAL #3   Title Pt dorsiflexion to be to 5 degrees to allow pt to have a normalized heel toe gait pattern for decrease stress on body structures.    Time 4   Period Weeks   PT LONG TERM GOAL #4   Title Pt strength of LE to be at least 4+/5 to stop patient from compensatory techniques aggrevating her pain    Time 4   Period Weeks               Plan - 01/30/16 0820    Clinical Impression Statement Pt continues to need verbal cuing for proper execution. Added squats and lateral step ups to program.  Pt continues to have very tight gastroc/soleus complex in the lateral aspect.     PT Next Visit Plan begin forward and lateral step up activities without UE assist.          Problem List Patient Active Problem List   Diagnosis Date Noted  . Upper airway cough syndrome 04/29/2015  . Essential hypertension 04/13/2015  . Hoarseness 04/13/2015  . Low TSH level 07/30/2014  . Neck discomfort 07/30/2014  . Goiter ? right  07/30/2014  . Trouble swallowing hx of  07/30/2014  . Visit for preventive health examination 07/14/2014  . GERD (gastroesophageal reflux disease) 01/01/2014  . Hypokalemia 12/18/2013  . Shortness of breath 12/18/2013  . Dyspnea 11/25/2013  . Complete tear of right rotator cuff 11/03/2013  . Right shoulder pain 06/30/2013  . Leg pain 05/08/2012  . Chest pressure "fatigue" 05/08/2012  . Sleep disturbance, unspecified 05/08/2012  . Skin cyst 07/16/2011  . Cyst 07/16/2011  . Ear pain 07/02/2011  . HEPATIC CYST 09/12/2009  . ADVERSE REACTION TO MEDICATION 09/12/2009  . BACK PAIN, THORACIC REGION, LEFT 03/28/2009  . CAD, NATIVE VESSEL 12/26/2008  . CHEST PAIN, ATYPICAL  12/02/2008  .  NAUSEA AND VOMITING 02/16/2008  . INTERNAL HEMORRHOIDS 01/09/2008  . EXTERNAL HEMORRHOIDS 01/09/2008  . LIPOMA NOS 09/16/2007  . HYPERLIPIDEMIA 09/16/2007    Rayetta Humphrey, PT CLT [phone] 01/30/2016, 8:45 AM  [place], [place], [address] Phone: [phone]   Fax:  [phone]  Name: Kaylee Adams MRN: [id number] Date of Birth: Apr 19, 1951

## 2016-02-01 ENCOUNTER — Ambulatory Visit (HOSPITAL_COMMUNITY): Payer: 59 | Admitting: Physical Therapy

## 2016-02-01 DIAGNOSIS — M25671 Stiffness of right ankle, not elsewhere classified: Secondary | ICD-10-CM | POA: Diagnosis not present

## 2016-02-01 DIAGNOSIS — R269 Unspecified abnormalities of gait and mobility: Secondary | ICD-10-CM | POA: Diagnosis not present

## 2016-02-01 DIAGNOSIS — M25571 Pain in right ankle and joints of right foot: Secondary | ICD-10-CM

## 2016-02-01 DIAGNOSIS — R29898 Other symptoms and signs involving the musculoskeletal system: Secondary | ICD-10-CM | POA: Diagnosis not present

## 2016-02-01 NOTE — Therapy (Addendum)
[place], [place], [address] Phone: [phone]   Fax:  [phone]  Physical Therapy Treatment  Patient Details  Name: Kaylee Adams MRN: [id number] Date of Birth: [birth year] Referring Provider: Rush Farmer  Encounter Date: 02/01/2016      PT End of Session - 02/01/16 0848    Visit Number 8   Number of Visits 12   Authorization Type UMR    Authorization - Visit Number 8   Authorization - Number of Visits 10   PT Start Time 0802   PT Stop Time 0848   PT Time Calculation (min) 46 min   Activity Tolerance Patient tolerated treatment well   Behavior During Therapy [facility] for tasks assessed/performed      Past Medical History  Diagnosis Date  . HLD (hyperlipidemia)   . HTN (hypertension)   . CAD (coronary artery disease)     a. 11/2005 Cath: nonobs dzs, 30% LAD lesion on cath ;  b. 11/2005 Echo: NL EF;  c. 04/2012 Echo: EF 55-60%, Gr 2 DD;  d. 04/2012 Ex MV: EF 72%, ST depression in recovery with NL perfusion imaging - HTN response to exercise.  . Hepatic cyst     on ct [place]  . Midsternal chest pain     cardiology eval with non-ischemic stress test 04/2012    Past Surgical History  Procedure Laterality Date  . Vesicovaginal fistula closure w/ tah      for fibroid tumors  . Breast biopsy    . Cholecystectomy    . Child birth x 5    . Abdominal hysterectomy      fibroid tumors  . Shoulder arthroscopy with rotator cuff repair and subacromial decompression Right 11/03/2013    Procedure: RIGHT SHOULDER ARTHROSCOPY WITH DEBRIDEMENT, ROTATOR CUFF REPAIR AND SUBACROMIAL DECOMPRESSION, DISTAL CLAVICLE RESECTION;  Surgeon: Mcarthur Rossetti, MD;  Location: [place];  Service: Orthopedics;  Laterality: Right;    There were no vitals filed for this visit.  Visit Diagnosis:  Stiffness of ankle joint, right  Abnormality of gait  Right leg weakness  Pain in joint, ankle and foot, right      Subjective Assessment -  02/01/16 0809    Subjective Pt is not having any pain she is doing her exercises at home.     Currently in Pain? No/denies               [facility] Adult PT Treatment/Exercise - 02/01/16 0001    Exercises   Exercises Knee/Hip;Ankle   Knee/Hip Exercises: Stretches   Gastroc Stretch Right;3 reps;30 seconds   Gastroc Stretch Limitations slant board    Other Knee/Hip Stretches Plantar flexion  3x 30" max 22"    Knee/Hip Exercises: Standing   Heel Raises 15 reps   Heel Raises Limitations combined with heel raises    Forward Lunges Right;15 reps   Forward Lunges Limitations on 4" box    Side Lunges Right;15 reps   Lateral Step Up Right;15 reps   Forward Step Up 15 reps   Other Standing Knee Exercises heelwalk/toe walk both x 2 reps      manual completed to Right gastroc/soleus complex with increased mm tightness and fascial restrictions noted.  Manual decreased both tightness and restrictions allowing improved ROM            PT Education - 02/01/16 0848    Education provided Yes   Education Details to complete heel walk and toe walk at home  Person(s) Educated Patient   Methods Explanation   Comprehension Verbalized understanding          PT Short Term Goals - 01/09/16 1002    PT SHORT TERM GOAL #1   Title Pt to be Independent  in HEP in order to meet goals on a timely basis    Time 2   Period Weeks   PT SHORT TERM GOAL #2   Title Pt pain level to be no greater than a 4/10 to improve quality of life    Time 2   Period Weeks   PT SHORT TERM GOAL #3   Title Pt AROM for dorsiflexion to be to neutral in order to allow pt to be able to wear flat shoes    Time 2   Period Weeks           PT Long Term Goals - 01/09/16 1003    PT LONG TERM GOAL #1   Title Pt to be Independent in advance HEP in order to return to normal funcitoning status    Time 4   Period Weeks   PT LONG TERM GOAL #2   Title Pt pain level to be no greater than a 2/10 80% of the day for  improved quality of life    Time 4   Period Weeks   PT LONG TERM GOAL #3   Title Pt dorsiflexion to be to 5 degrees to allow pt to have a normalized heel toe gait pattern for decrease stress on body structures.    Time 4   Period Weeks   PT LONG TERM GOAL #4   Title Pt strength of LE to be at least 4+/5 to stop patient from compensatory techniques aggrevating her pain    Time 4   Period Weeks               Plan - 02/01/16 [id number]    Clinical Impression Statement Pt completed exercises away from bars to focus on balance,  Pt still needs verbal cuing as she tends to complete exercises with weight through the ball of her foot rather than the heel of her foot.  Added toe walk/heel walk for improved balance and strength.    PT Next Visit Plan reassess next visit to update HEP         Problem List Patient Active Problem List   Diagnosis Date Noted  . Upper airway cough syndrome 04/29/2015  . Essential hypertension 04/13/2015  . Hoarseness 04/13/2015  . Low TSH level 07/30/2014  . Neck discomfort 07/30/2014  . Goiter ? right  07/30/2014  . Trouble swallowing hx of  07/30/2014  . Visit for preventive health examination 07/14/2014  . GERD (gastroesophageal reflux disease) 01/01/2014  . Hypokalemia 12/18/2013  . Shortness of breath 12/18/2013  . Dyspnea 11/25/2013  . Complete tear of right rotator cuff 11/03/2013  . Right shoulder pain 06/30/2013  . Leg pain 05/08/2012  . Chest pressure "fatigue" 05/08/2012  . Sleep disturbance, unspecified 05/08/2012  . Skin cyst 07/16/2011  . Cyst 07/16/2011  . Ear pain 07/02/2011  . HEPATIC CYST 09/12/2009  . ADVERSE REACTION TO MEDICATION 09/12/2009  . BACK PAIN, THORACIC REGION, LEFT 03/28/2009  . CAD, NATIVE VESSEL 12/26/2008  . CHEST PAIN, ATYPICAL 12/02/2008  . NAUSEA AND VOMITING 02/16/2008  . INTERNAL HEMORRHOIDS 01/09/2008  . EXTERNAL HEMORRHOIDS 01/09/2008  . LIPOMA NOS 09/16/2007  . HYPERLIPIDEMIA 09/16/2007   Rayetta Humphrey, PT CLT [phone] 02/01/2016, 8:52 AM  [place]  [facility] [place], [place], [address] Phone: [phone]   Fax:  [phone]  Name: Kaylee Adams MRN: [id number] Date of Birth: [birth year]

## 2016-02-03 ENCOUNTER — Ambulatory Visit (HOSPITAL_COMMUNITY): Payer: 59

## 2016-02-03 DIAGNOSIS — R269 Unspecified abnormalities of gait and mobility: Secondary | ICD-10-CM | POA: Diagnosis not present

## 2016-02-03 DIAGNOSIS — M25671 Stiffness of right ankle, not elsewhere classified: Secondary | ICD-10-CM

## 2016-02-03 DIAGNOSIS — M25571 Pain in right ankle and joints of right foot: Secondary | ICD-10-CM

## 2016-02-03 DIAGNOSIS — R29898 Other symptoms and signs involving the musculoskeletal system: Secondary | ICD-10-CM

## 2016-02-03 NOTE — Therapy (Signed)
[name], [place], [address] Phone: [phone]   Fax:  [phone]  Physical Therapy Treatment  Patient Details  Name: Kaylee Adams MRN: [id number] Date of Birth: 31-Jan-1951 Referring Provider: Rush Farmer  Encounter Date: 02/03/2016      PT End of Session - 02/03/16 0800    Visit Number 9   Number of Visits 12   Date for PT Re-Evaluation 02/08/16   Authorization Type UMR    Authorization - Visit Number 9   Authorization - Number of Visits 10   PT Start Time 0800   PT Stop Time 0846   PT Time Calculation (min) 46 min   Activity Tolerance Patient tolerated treatment well   Behavior During Therapy [facility] for tasks assessed/performed      Past Medical History  Diagnosis Date  . HLD (hyperlipidemia)   . HTN (hypertension)   . CAD (coronary artery disease)     a. 11/2005 Cath: nonobs dzs, 30% LAD lesion on cath ;  b. 11/2005 Echo: NL EF;  c. 04/2012 Echo: EF 55-60%, Gr 2 DD;  d. 04/2012 Ex MV: EF 72%, ST depression in recovery with NL perfusion imaging - HTN response to exercise.  . Hepatic cyst     on ct [place]  . Midsternal chest pain     cardiology eval with non-ischemic stress test 04/2012    Past Surgical History  Procedure Laterality Date  . Vesicovaginal fistula closure w/ tah      for fibroid tumors  . Breast biopsy    . Cholecystectomy    . Child birth x 5    . Abdominal hysterectomy      fibroid tumors  . Shoulder arthroscopy with rotator cuff repair and subacromial decompression Right 11/03/2013    Procedure: RIGHT SHOULDER ARTHROSCOPY WITH DEBRIDEMENT, ROTATOR CUFF REPAIR AND SUBACROMIAL DECOMPRESSION, DISTAL CLAVICLE RESECTION;  Surgeon: Mcarthur Rossetti, MD;  Location: [facility];  Service: Orthopedics;  Laterality: Right;    There were no vitals filed for this visit.  Visit Diagnosis:  Stiffness of ankle joint, right  Abnormality of gait  Right leg weakness  Pain in joint, ankle and foot,  right      Subjective Assessment - 02/03/16 0801    Subjective Pt denied pain upon arrival and stated " my ankle has been doing well". Pt denied R ankle pain within the last week and further stated that she is almost at 100%.    Pertinent History HTN   Limitations Walking;Standing   Patient Stated Goals Pt's goal is to improve her R ankle strength and reduce pain to a 0/10 on a VAS.    Currently in Pain? No/denies                         [facility] Adult PT Treatment/Exercise - 02/03/16 0001    Knee/Hip Exercises: Stretches   Gastroc Stretch Right;3 reps;30 seconds   Gastroc Stretch Limitations slant board    Other Knee/Hip Stretches Plantar flexion  3x 30"    Knee/Hip Exercises: Aerobic   Nustep 6 minutes on level 3    Knee/Hip Exercises: Standing   Heel Raises 15 reps;1 set;Other (comment)  B UE support    Forward Lunges Right;15 reps   Forward Lunges Limitations on 4" box    Side Lunges Right;15 reps   Lateral Step Up Right;15 reps;2 sets;Step Height: 4";Hand Hold: 1   Forward Step Up Right;2 sets;10 reps;Step  Height: 4";Hand Hold: 1   Functional Squat 2 sets;15 reps   Rocker Board 2 minutes   Rocker Board Limitations R/L and A/P   SLS x 5    Ankle Exercises: Supine   T-Band 1 set of 30 reps with use fo red TB= R ankle PF                PT Education - 02/03/16 0812    Education provided Yes   Education Details Educated pt on current HEP and proper stretching parameters    Person(s) Educated Patient   Methods Explanation   Comprehension Verbalized understanding          PT Short Term Goals - 01/09/16 1002    PT SHORT TERM GOAL #1   Title Pt to be I in HEP in order to meet goals on a timely basis    Time 2   Period Weeks   PT SHORT TERM GOAL #2   Title Pt pain level to be no greater than a 4/10 to improve quality of life    Time 2   Period Weeks   PT SHORT TERM GOAL #3   Title Pt AROM for dorsiflexion to be to neutral in order to allow pt  to be able to wear flat shoes    Time 2   Period Weeks           PT Long Term Goals - 01/09/16 1003    PT LONG TERM GOAL #1   Title Pt to be I in advance HEP in order to return to normal funcitoning status    Time 4   Period Weeks   PT LONG TERM GOAL #2   Title Pt pain level to be no greater than a 2/10 80% of the day for improved quality of life    Time 4   Period Weeks   PT LONG TERM GOAL #3   Title Pt dorsiflexion to be to 5 degrees to allow pt to have a normalized heel toe gait pattern for decrease stress on body structures.    Time 4   Period Weeks   PT LONG TERM GOAL #4   Title Pt strength of LE to be at least 4+/5 to stop patient from compensatory techniques aggrevating her pain    Time 4   Period Weeks               Plan - 02/03/16 0800    Clinical Impression Statement  Initiated PT tx with warm-up on Nustep on level 3 x 6 minutes. Increased sets with step-ups and squats with good tolerance reported. Pt remained pain-free throughout today's PT tx, except for minor soreness reported on the outside of the R ankle during heel raises after completing 15 reps. Symptoms quickly subsided to a 0/10 on a VAS once heel raises were discontinued.  Thoroughly discussed proper technique and parameters with each ther ex with improved understanding verbalized per pt. Pt is making excellent progress towards stated PT goals with less severe R ankle pain and improved activity tolerance. Will plan to formally reassess goals and functional status at next visit.    Pt will benefit from skilled therapeutic intervention in order to improve on the following deficits Abnormal gait;Decreased activity tolerance;Decreased balance;Decreased mobility;Decreased range of motion;Decreased strength;Difficulty walking;Increased fascial restricitons;Pain;Hypomobility   Rehab Potential Good   PT Frequency 3x / week   PT Duration 4 weeks   PT Treatment/Interventions Patient/family education;Gait  training;Stair training;Functional mobility training;Therapeutic activities;Therapeutic exercise;Balance training;Manual  techniques;Ultrasound;Passive range of motion   PT Next Visit Plan Complete re-assessment at next PT visit and update HEP with advanced ther ex   PT Home Exercise Plan Reviewed HEP with no changes made this visit. Plan to update HEP at next visit with addition of lunges, squats, and step-ups   Consulted and Agree with Plan of Care Patient        Problem List Patient Active Problem List   Diagnosis Date Noted  . Upper airway cough syndrome 04/29/2015  . Essential hypertension 04/13/2015  . Hoarseness 04/13/2015  . Low TSH level 07/30/2014  . Neck discomfort 07/30/2014  . Goiter ? right  07/30/2014  . Trouble swallowing hx of  07/30/2014  . Visit for preventive health examination 07/14/2014  . GERD (gastroesophageal reflux disease) 01/01/2014  . Hypokalemia 12/18/2013  . Shortness of breath 12/18/2013  . Dyspnea 11/25/2013  . Complete tear of right rotator cuff 11/03/2013  . Right shoulder pain 06/30/2013  . Leg pain 05/08/2012  . Chest pressure "fatigue" 05/08/2012  . Sleep disturbance, unspecified 05/08/2012  . Skin cyst 07/16/2011  . Cyst 07/16/2011  . Ear pain 07/02/2011  . HEPATIC CYST 09/12/2009  . ADVERSE REACTION TO MEDICATION 09/12/2009  . BACK PAIN, THORACIC REGION, LEFT 03/28/2009  . CAD, NATIVE VESSEL 12/26/2008  . CHEST PAIN, ATYPICAL 12/02/2008  . NAUSEA AND VOMITING 02/16/2008  . INTERNAL HEMORRHOIDS 01/09/2008  . EXTERNAL HEMORRHOIDS 01/09/2008  . LIPOMA NOS 09/16/2007  . HYPERLIPIDEMIA 09/16/2007    Garen Lah, PT, DPT  02/03/2016, 8:52 AM  [place], [place], [address] Phone: [phone]   Fax:  [phone]  Name: ARAM DEVINCENT MRN: [id number] Date of Birth: 11-16-1951

## 2016-02-06 ENCOUNTER — Ambulatory Visit (HOSPITAL_COMMUNITY): Payer: 59 | Admitting: Physical Therapy

## 2016-02-06 DIAGNOSIS — R269 Unspecified abnormalities of gait and mobility: Secondary | ICD-10-CM | POA: Diagnosis not present

## 2016-02-06 DIAGNOSIS — M25671 Stiffness of right ankle, not elsewhere classified: Secondary | ICD-10-CM

## 2016-02-06 DIAGNOSIS — M25571 Pain in right ankle and joints of right foot: Secondary | ICD-10-CM | POA: Diagnosis not present

## 2016-02-06 DIAGNOSIS — R29898 Other symptoms and signs involving the musculoskeletal system: Secondary | ICD-10-CM

## 2016-02-06 NOTE — Therapy (Signed)
[place], [place], [address] Phone: [phone]   Fax:  [phone]  Physical Therapy Treatment  Patient Details  Name: Kaylee Adams MRN: [id number] Date of Birth: Jul 16, 1951 Referring Provider: Jean Rosenthal   Encounter Date: 02/06/2016      PT End of Session - 02/06/16 0924    Visit Number 10   Number of Visits 11   Date for PT Re-Evaluation --  completed today   Authorization Type UMR    Authorization - Visit Number 10   Authorization - Number of Visits 11   PT Start Time 0802   PT Stop Time 0846   PT Time Calculation (min) 44 min   Activity Tolerance Patient tolerated treatment well      Past Medical History  Diagnosis Date  . HLD (hyperlipidemia)   . HTN (hypertension)   . CAD (coronary artery disease)     a. 11/2005 Cath: nonobs dzs, 30% LAD lesion on cath ;  b. 11/2005 Echo: NL EF;  c. 04/2012 Echo: EF 55-60%, Gr 2 DD;  d. 04/2012 Ex MV: EF 72%, ST depression in recovery with NL perfusion imaging - HTN response to exercise.  . Hepatic cyst     on ct [place]  . Midsternal chest pain     cardiology eval with non-ischemic stress test 04/2012    Past Surgical History  Procedure Laterality Date  . Vesicovaginal fistula closure w/ tah      for fibroid tumors  . Breast biopsy    . Cholecystectomy    . Child birth x 5    . Abdominal hysterectomy      fibroid tumors  . Shoulder arthroscopy with rotator cuff repair and subacromial decompression Right 11/03/2013    Procedure: RIGHT SHOULDER ARTHROSCOPY WITH DEBRIDEMENT, ROTATOR CUFF REPAIR AND SUBACROMIAL DECOMPRESSION, DISTAL CLAVICLE RESECTION;  Surgeon: Mcarthur Rossetti, MD;  Location: [facility];  Service: Orthopedics;  Laterality: Right;    There were no vitals filed for this visit.  Visit Diagnosis:  Stiffness of ankle joint, right  Abnormality of gait  Right leg weakness  Pain in joint, ankle and foot, right      Subjective  Assessment - 02/06/16 0800    Subjective Pt states that she is doing almost 100% better.  She has no questions on her exercises.    Pertinent History HTN   Limitations Walking;Standing   How long can you sit comfortably? as long as she wants she might have pain    How long can you stand comfortably? able to stand as long as she wants she might have some pain    How long can you walk comfortably? 45 minutes    Patient Stated Goals Pt's goal is to improve her Rt ankle strength and reduce pain to a 0/10 on a VAS.  met    Pain Score 0-No pain            [facility] PT Assessment - 02/06/16 0001    Assessment   Medical Diagnosis Rt Peroneus longus    Referring Provider Jean Rosenthal    Onset Date/Surgical Date 12/07/15   Next MD Visit none scheduled    Prior Therapy none   Precautions   Precautions None   Restrictions   Weight Bearing Restrictions No   Balance Screen   Has the patient fallen in the past 6 months No   Has the patient had a decrease in activity level because of a fear  of falling?  No   Is the patient reluctant to leave their home because of a fear of falling?  No   Prior Function   Level of Independence Independent   Vocation Full time employment   Vocation Requirements sitting/standing    Leisure walking    Cognition   Overall Cognitive Status Within Functional Limits for tasks assessed   Observation/Other Assessments   Observations Pt gastroc mm is significantly atrophied commpared to Lt .    Focus on Therapeutic Outcomes (FOTO)  77  was 69   Functional Tests   Functional tests Single leg stance   Single Leg Stance   Comments B 60 seconds now was ; Lt 25; Rt 11   AROM   Left Ankle Dorsiflexion -15   Left Ankle Plantar Flexion 60   Left Ankle Inversion 35  was 32   Left Ankle Eversion 15  was 12   Strength   Right Hip Flexion 5/5  3+/5    Right Hip Extension 4/5  was 4-/5    Right Hip ABduction 5/5  was 4/5    Right Knee Flexion 4+/5  was 3+/5    Right Knee Extension 5/5   Right Ankle Dorsiflexion 5/5  in available range    Right Ankle Plantar Flexion 3+/5  was 3-/5    Right Ankle Inversion 5/5  was 3+/5   Right Ankle Eversion 5/5  was 3+/5                      [facility] Adult PT Treatment/Exercise - 02/06/16 0001    Knee/Hip Exercises: Stretches   Gastroc Stretch Right;3 reps;30 seconds   Gastroc Stretch Limitations slant board  Repeat x 2 on step to show how patient could do this exercise at home.    Other Knee/Hip Stretches Plantar flexion  3x 30"    Knee/Hip Exercises: Standing   Heel Raises Right;10 reps   Heel Raises Limitations up on both, shift to Rt, lower Rt only    SLS x5   Other Standing Knee Exercises toe raises x 15    Knee/Hip Exercises: Seated   Sit to Sand 10 reps                  PT Short Term Goals - 02/06/16 0825    PT SHORT TERM GOAL #1   Title Pt to be Independent  in HEP in order to meet goals on a timely basis    Time 2   Period Weeks   Status Achieved   PT SHORT TERM GOAL #2   Title Pt pain level to be no greater than a 4/10 to improve quality of life    Time 2   Period Weeks   Status Achieved   PT SHORT TERM GOAL #3   Title Pt AROM for dorsiflexion to be to neutral in order to allow pt to be able to wear flat shoes    Time 2   Period Weeks   Status On-going           PT Long Term Goals - 02/06/16 8889    PT LONG TERM GOAL #1   Title Pt to be Independent in advance HEP in order to return to normal funcitoning status    Time 4   Period Weeks   Status Achieved   PT LONG TERM GOAL #2   Title Pt pain level to be no greater than a 2/10 80% of the day for improved  quality of life    Time 4   Period Weeks   Status Achieved   PT LONG TERM GOAL #3   Title Pt dorsiflexion to be to 5 degrees to allow pt to have a normalized heel toe gait pattern for decrease stress on body structures.    Time 4   Status On-going   PT LONG TERM GOAL #4   Title Pt strength of LE  to be at least 4+/5 to stop patient from compensatory techniques aggrevating her pain    Time 4   Period Weeks   Status Partially Met               Plan - 02/06/16 0925    Clinical Impression Statement Pt reassessed today.  Pt demonstrates improvement in all areas.  Pt requests that next session be her last as she is going out of town on Thursday and feels that she is able to continue improving with exercising on her own.  Pt shown sit to stands to improve hamstring hip extensor strength.  Therapist emphasized the need to stretch everyday for a prolong period, (months not weeks) as pt continues to have significant decreased dorsiflexion.    Pt will benefit from skilled therapeutic intervention in order to improve on the following deficits Abnormal gait;Decreased activity tolerance;Decreased balance;Decreased mobility;Decreased range of motion;Decreased strength;Difficulty walking;Increased fascial restricitons;Pain;Hypomobility   Rehab Potential Good   PT Treatment/Interventions Patient/family education;Gait training;Stair training;Functional mobility training;Therapeutic activities;Therapeutic exercise;Balance training;Manual techniques;Ultrasound;Passive range of motion   PT Next Visit Plan Answer any questions pt may have. Discharge from foto,(foto done this session, and close episode.  A discharge note hs been sent to MD).   Due to pt lack of ROM she may benefit from an ankle JAS.  Therapist has sent this to MD but has not been discussed with patient.  If patient wants please measure for Rt ankle JAS.  Pt may still be discharged to home exercise program and can meet JAS representative at facility when it comes in.         Problem List Patient Active Problem List   Diagnosis Date Noted  . Upper airway cough syndrome 04/29/2015  . Essential hypertension 04/13/2015  . Hoarseness 04/13/2015  . Low TSH level 07/30/2014  . Neck discomfort 07/30/2014  . Goiter ? right  07/30/2014  .  Trouble swallowing hx of  07/30/2014  . Visit for preventive health examination 07/14/2014  . GERD (gastroesophageal reflux disease) 01/01/2014  . Hypokalemia 12/18/2013  . Shortness of breath 12/18/2013  . Dyspnea 11/25/2013  . Complete tear of right rotator cuff 11/03/2013  . Right shoulder pain 06/30/2013  . Leg pain 05/08/2012  . Chest pressure "fatigue" 05/08/2012  . Sleep disturbance, unspecified 05/08/2012  . Skin cyst 07/16/2011  . Cyst 07/16/2011  . Ear pain 07/02/2011  . HEPATIC CYST 09/12/2009  . ADVERSE REACTION TO MEDICATION 09/12/2009  . BACK PAIN, THORACIC REGION, LEFT 03/28/2009  . CAD, NATIVE VESSEL 12/26/2008  . CHEST PAIN, ATYPICAL 12/02/2008  . NAUSEA AND VOMITING 02/16/2008  . INTERNAL HEMORRHOIDS 01/09/2008  . EXTERNAL HEMORRHOIDS 01/09/2008  . LIPOMA NOS 09/16/2007  . HYPERLIPIDEMIA 09/16/2007    Rayetta Humphrey, PT CLT [phone] 02/06/2016, 9:30 AM  [place], [place], [address] Phone: [phone]   Fax:  [phone]  Name: Kaylee Adams MRN: [id number] Date of Birth: 1951/12/10  PHYSICAL THERAPY DISCHARGE SUMMARY  Visits from Start of Care: 10 will be 11 at  discharge.   Current functional level related to goals / functional outcomes: See above    Remaining deficits: ROM is pt main deficit at this time.  Pt has been so tight for so long this will take months to get back.  Pt may benefit from a JAS  For her Rt ankle.  If MD agrees please fax back referral.    Education / Equipment: HEP   Plan: Patient agrees to discharge.  Patient goals were not met. Patient is being discharged due to                                                     ?????   Rayetta Humphrey, [place] CLT [phone]

## 2016-02-06 NOTE — Patient Instructions (Addendum)
Plantar Fascia Stretch    Standing with only ball of left foot on stair, push heel down until stretch is felt through arch of foot. Hold _30-60___ seconds. Relax. Repeat __3__ times per set. Do ___1_ sets per session. Do 3____ sessions per day.  http://orth.exer.us/22   Copyright  VHI. All rights reserved.  Toe Raise (Standing)    Rock back on heels. Repeat __10-15__ times per set. Do _1___ sets per session. Do __2__ sessions per day.  http://orth.exer.us/42   Copyright  VHI. All rights reserved.  Functional Quadriceps: Sit to Stand   With right foot behind slowly raise up.  Sit on edge of chair, feet flat on floor. Stand upright, extending knees fully. Repeat _10___ times per set. Do _1___ sets per session. Do ___3_ sessions per day. 1 http://orth.exer.us/734   Copyright  VHI. All rights reserved.

## 2016-02-08 ENCOUNTER — Ambulatory Visit (HOSPITAL_COMMUNITY): Payer: 59 | Admitting: Physical Therapy

## 2016-02-08 DIAGNOSIS — R29898 Other symptoms and signs involving the musculoskeletal system: Secondary | ICD-10-CM

## 2016-02-08 DIAGNOSIS — M25571 Pain in right ankle and joints of right foot: Secondary | ICD-10-CM | POA: Diagnosis not present

## 2016-02-08 DIAGNOSIS — R269 Unspecified abnormalities of gait and mobility: Secondary | ICD-10-CM

## 2016-02-08 DIAGNOSIS — M25671 Stiffness of right ankle, not elsewhere classified: Secondary | ICD-10-CM

## 2016-02-08 NOTE — Therapy (Addendum)
[place], [place], [address] Phone: [phone]   Fax:  [phone]  Physical Therapy Treatment  Patient Details  Name: Kaylee Adams MRN: [id number] Date of Birth: [birth year] Referring Provider: Jean Rosenthal   Encounter Date: 02/08/2016      PT End of Session - 02/08/16 0840    Visit Number 11   Number of Visits 11   Authorization Type UMR    Authorization - Visit Number 11   Authorization - Number of Visits 11   PT Start Time 0806   PT Stop Time 0831   PT Time Calculation (min) 25 min   Activity Tolerance Patient tolerated treatment well      Past Medical History  Diagnosis Date  . HLD (hyperlipidemia)   . HTN (hypertension)   . CAD (coronary artery disease)     a. 11/2005 Cath: nonobs dzs, 30% LAD lesion on cath ;  b. 11/2005 Echo: NL EF;  c. 04/2012 Echo: EF 55-60%, Gr 2 DD;  d. 04/2012 Ex MV: EF 72%, ST depression in recovery with NL perfusion imaging - HTN response to exercise.  . Hepatic cyst     on ct [place]  . Midsternal chest pain     cardiology eval with non-ischemic stress test 04/2012    Past Surgical History  Procedure Laterality Date  . Vesicovaginal fistula closure w/ tah      for fibroid tumors  . Breast biopsy    . Cholecystectomy    . Child birth x 5    . Abdominal hysterectomy      fibroid tumors  . Shoulder arthroscopy with rotator cuff repair and subacromial decompression Right 11/03/2013    Procedure: RIGHT SHOULDER ARTHROSCOPY WITH DEBRIDEMENT, ROTATOR CUFF REPAIR AND SUBACROMIAL DECOMPRESSION, DISTAL CLAVICLE RESECTION;  Surgeon: Mcarthur Rossetti, MD;  Location: [facility];  Service: Orthopedics;  Laterality: Right;    There were no vitals filed for this visit.  Visit Diagnosis:  Stiffness of ankle joint, right  Abnormality of gait  Right leg weakness  Pain in joint, ankle and foot, right      Subjective Assessment - 02/08/16 0835    Subjective Pt has no  questions reguarding her home exercise program.  Pt had questions about JAS which were answered by therapist.    Pertinent History HTN   Limitations Walking;Standing   How long can you sit comfortably? as long as she wants no pain     How long can you stand comfortably? able to stand as long as she wants she might have some pain    How long can you walk comfortably? 45 minutes    Patient Stated Goals Pt's goal is to improve her R ankle strength and reduce pain to a 0/10 on a VAS.  met    Currently in Pain? No/denies                [facility] Adult PT Treatment/Exercise - 02/08/16 0001    Manual Therapy   Manual Therapy Soft tissue mobilization;Other (comment)   Manual therapy comments Prone at beginning of session   Soft tissue mobilization Gastroc/soleus complex to reduce tightness                PT Education - 02/08/16 0840    Education provided Yes   Education Details JAS usage; the importance of stretching Both LE    Person(s) Educated Patient   Methods Explanation   Comprehension Verbalized understanding  PT Short Term Goals - 02/06/16 0825    PT SHORT TERM GOAL #1   Title Pt to be Independent  in HEP in order to meet goals on a timely basis    Time 2   Period Weeks   Status Achieved   PT SHORT TERM GOAL #2   Title Pt pain level to be no greater than a 4/10 to improve quality of life    Time 2   Period Weeks   Status Achieved   PT SHORT TERM GOAL #3   Title Pt AROM for dorsiflexion to be to neutral in order to allow pt to be able to wear flat shoes    Time 2   Period Weeks   Status On-going           PT Long Term Goals - 02/06/16 2671    PT LONG TERM GOAL #1   Title Pt to be Independent in advance HEP in order to return to normal funcitoning status    Time 4   Period Weeks   Status Achieved   PT LONG TERM GOAL #2   Title Pt pain level to be no greater than a 2/10 80% of the day for improved quality of life    Time 4   Period Weeks    Status Achieved   PT LONG TERM GOAL #3   Title Pt dorsiflexion to be to 5 degrees to allow pt to have a normalized heel toe gait pattern for decrease stress on body structures.    Time 4   Status On-going   PT LONG TERM GOAL #4   Title Pt strength of LE to be at least 4+/5 to stop patient from compensatory techniques aggrevating her pain    Time 4   Period Weeks   Status Partially Met               Plan - 02/08/16 0841    Clinical Impression Statement Pt questions answered about JAS ankle brace; pt measured for brace but we have not received an order from MD at this point.  Therapist will check on order today.  Manual continues to demonstrate increased fascial restricitons on lateral aspect of  gastrocnemius muscle.  Pt requested to leave early due to work obligation.  Pt is ready for discharge.    Pt will benefit from skilled therapeutic intervention in order to improve on the following deficits Abnormal gait;Decreased activity tolerance;Decreased balance;Decreased mobility;Decreased range of motion;Decreased strength;Difficulty walking;Increased fascial restricitons;Pain;Hypomobility   PT Next Visit Plan Discharge      PHYSICAL THERAPY DISCHARGE SUMMARY  Visits from Start of Care: 11  Current functional level related to goals / functional outcomes: See above   Remaining deficits: See above    Education / Equipment: HEP possible JAS is approved by MD   Plan: Patient agrees to discharge.  Patient goals were partially met. Patient is being discharged due to being pleased with the current functional level.  ?????   Rayetta Humphrey, PT CLT [phone]    Problem List Patient Active Problem List   Diagnosis Date Noted  . Upper airway cough syndrome 04/29/2015  . Essential hypertension 04/13/2015  . Hoarseness 04/13/2015  . Low TSH level 07/30/2014  . Neck discomfort 07/30/2014  . Goiter ? right  07/30/2014  . Trouble swallowing hx of  07/30/2014  . Visit for  preventive health examination 07/14/2014  . GERD (gastroesophageal reflux disease) 01/01/2014  . Hypokalemia 12/18/2013  . Shortness of breath 12/18/2013  .  Dyspnea 11/25/2013  . Complete tear of right rotator cuff 11/03/2013  . Right shoulder pain 06/30/2013  . Leg pain 05/08/2012  . Chest pressure "fatigue" 05/08/2012  . Sleep disturbance, unspecified 05/08/2012  . Skin cyst 07/16/2011  . Cyst 07/16/2011  . Ear pain 07/02/2011  . HEPATIC CYST 09/12/2009  . ADVERSE REACTION TO MEDICATION 09/12/2009  . BACK PAIN, THORACIC REGION, LEFT 03/28/2009  . CAD, NATIVE VESSEL 12/26/2008  . CHEST PAIN, ATYPICAL 12/02/2008  . NAUSEA AND VOMITING 02/16/2008  . INTERNAL HEMORRHOIDS 01/09/2008  . EXTERNAL HEMORRHOIDS 01/09/2008  . LIPOMA NOS 09/16/2007  . HYPERLIPIDEMIA 09/16/2007   Rayetta Humphrey, PT CLT [phone] 02/08/2016, 8:48 AM  [place], [place], [address] Phone: [phone]   Fax:  [phone]  Name: Kaylee Adams MRN: [id number] Date of Birth: 08-31-1951

## 2016-02-10 ENCOUNTER — Encounter (HOSPITAL_COMMUNITY): Payer: 59 | Admitting: Physical Therapy

## 2016-02-14 ENCOUNTER — Telehealth (HOSPITAL_COMMUNITY): Payer: Self-pay | Admitting: Physical Therapy

## 2016-02-14 NOTE — Telephone Encounter (Signed)
L/M ask pt to call back to schedule apptment with CR to measure for JAS Ankle Brace. NF 02/15/16

## 2016-02-28 ENCOUNTER — Other Ambulatory Visit: Payer: Self-pay | Admitting: Internal Medicine

## 2016-02-28 MED FILL — AMLODIPINE BESYLATE 5 MG TA: 5 | 90 days supply | Qty: 90 | Fill #0

## 2016-02-28 MED FILL — LOSARTAN-HCTZ 100-25 MG TAB: 100-25 | 90 days supply | Qty: 90 | Fill #0

## 2016-02-28 NOTE — Telephone Encounter (Signed)
Sent to the pharmacy by e-scribe. 

## 2016-05-29 ENCOUNTER — Ambulatory Visit
Admission: RE | Admit: 2016-05-29 | Discharge: 2016-05-29 | Disposition: A | Discharge status: Home / Self Care | Payer: 59 | Source: Ambulatory Visit | Attending: Endocrinology | Admitting: Endocrinology

## 2016-05-29 ENCOUNTER — Other Ambulatory Visit: Payer: 59

## 2016-05-29 DIAGNOSIS — E049 Nontoxic goiter, unspecified: Secondary | ICD-10-CM

## 2016-05-29 DIAGNOSIS — E042 Nontoxic multinodular goiter: Secondary | ICD-10-CM | POA: Diagnosis not present

## 2016-06-05 DIAGNOSIS — E0590 Thyrotoxicosis, unspecified without thyrotoxic crisis or storm: Secondary | ICD-10-CM | POA: Diagnosis not present

## 2016-06-05 MED FILL — LOSARTAN-HCTZ 100-25 MG TAB: 100-25 | 90 days supply | Qty: 90 | Fill #1

## 2016-06-05 MED FILL — AMLODIPINE BESYLATE 5 MG TA: 5 | 90 days supply | Qty: 90 | Fill #1

## 2016-07-05 ENCOUNTER — Other Ambulatory Visit (INDEPENDENT_AMBULATORY_CARE_PROVIDER_SITE_OTHER): Payer: 59

## 2016-07-05 DIAGNOSIS — Z1159 Encounter for screening for other viral diseases: Secondary | ICD-10-CM | POA: Diagnosis not present

## 2016-07-05 DIAGNOSIS — E785 Hyperlipidemia, unspecified: Secondary | ICD-10-CM

## 2016-07-05 DIAGNOSIS — Z114 Encounter for screening for human immunodeficiency virus [HIV]: Secondary | ICD-10-CM

## 2016-07-05 LAB — HIV ANTIBODY (ROUTINE TESTING W REFLEX): HIV 1&2 Ab, 4th Generation: NONREACTIVE

## 2016-07-05 LAB — LIPID PANEL
CHOL/HDL RATIO: 4
Cholesterol: 222 mg/dL — ABNORMAL HIGH (ref 0–200)
HDL: 56.7 mg/dL (ref >39.00)
LDL Cholesterol: 148 mg/dL — ABNORMAL HIGH (ref 0–99)
NONHDL: 164.81
Triglycerides: 86 mg/dL (ref 0.0–149.0)
VLDL: 17.2 mg/dL (ref 0.0–40.0)

## 2016-07-06 LAB — HEPATITIS C ANTIBODY: HCV AB: NEGATIVE

## 2016-07-12 ENCOUNTER — Encounter: Payer: Self-pay | Admitting: Internal Medicine

## 2016-07-17 NOTE — Telephone Encounter (Signed)
Improvement  Is   About 25- 30 %    Lower is better but  The tipping point  Of risk bnefit of medication  depends on age .   LDL below 100 is  The best  .  Yours is  148  But previously was 186 .  We can  Refer to nutrition or  the lipid clinic Guidelines say she may benefit from adding a  Medication but she can keep working on lifestyle and repeat in  Another 6 months  If still not coming down can add medication at that  [place].

## 2016-09-04 MED FILL — AMLODIPINE BESYLATE 5 MG TA: 5 | 90 days supply | Qty: 90 | Fill #2

## 2016-09-04 MED FILL — LOSARTAN-HCTZ 100-25 MG TAB: 100-25 | 90 days supply | Qty: 90 | Fill #2

## 2016-09-14 DIAGNOSIS — Z90711 Acquired absence of uterus with remaining cervical stump: Secondary | ICD-10-CM | POA: Diagnosis not present

## 2016-09-14 DIAGNOSIS — Z01419 Encounter for gynecological examination (general) (routine) without abnormal findings: Secondary | ICD-10-CM | POA: Diagnosis not present

## 2016-09-14 DIAGNOSIS — Z1151 Encounter for screening for human papillomavirus (HPV): Secondary | ICD-10-CM | POA: Diagnosis not present

## 2016-09-14 DIAGNOSIS — Z6832 Body mass index (BMI) 32.0-32.9, adult: Secondary | ICD-10-CM | POA: Diagnosis not present

## 2016-09-14 DIAGNOSIS — Z1231 Encounter for screening mammogram for malignant neoplasm of breast: Secondary | ICD-10-CM | POA: Diagnosis not present

## 2016-09-24 ENCOUNTER — Encounter (HOSPITAL_COMMUNITY): Payer: Self-pay | Admitting: Emergency Medicine

## 2016-09-24 ENCOUNTER — Ambulatory Visit (HOSPITAL_COMMUNITY)
Admission: EM | Admit: 2016-09-24 | Discharge: 2016-09-24 | Disposition: A | Discharge status: Home / Self Care | Payer: 59 | Attending: Internal Medicine | Admitting: Internal Medicine

## 2016-09-24 DIAGNOSIS — R1011 Right upper quadrant pain: Secondary | ICD-10-CM

## 2016-09-24 NOTE — ED Notes (Signed)
Patient would not tolerate blood draw. Lab orders cancelled by the provider.

## 2016-09-24 NOTE — ED Triage Notes (Signed)
Patient reports to [facility], for Abdominal Pain in the Right Quadrant and Mid-Back Pain, Patient states that she has noticed over the past week she has also, had decreased energy.

## 2016-09-24 NOTE — ED Provider Notes (Signed)
[place]    CSN: [id number] Arrival date & time: 09/24/16  1758     History   Chief Complaint Chief Complaint  Patient presents with  . Abdominal Pain    HPI Kaylee Adams is a 65 y.o. female.   Complains of RUQ pain x3 days.  Pain starts anteriorly and radiates posteriorly.  Not specifically associated with food.  Rarely associated with nausea.  Denies vomiting or diarrhea.  Bowels are normal (never constipated).  Her diet is unchanged.  Notably, when the pain started she had kale salad, collard greens and fried chicken.  The patient is s/p cholecystectomy but states that fried foods have not bothered her in the past.  She remembers hearing that she may have fatty liver in the past and that her PMD wanted her to have an MRI of her liver at a time when her insurance did not cover it.  She never followed up.      Past Medical History:  Diagnosis Date  . CAD (coronary artery disease)    a. 11/2005 Cath: nonobs dzs, 30% LAD lesion on cath ;  b. 11/2005 Echo: NL EF;  c. 04/2012 Echo: EF 55-60%, Gr 2 DD;  d. 04/2012 Ex MV: EF 72%, ST depression in recovery with NL perfusion imaging - HTN response to exercise.  . Hepatic cyst    on ct [place]  . HLD (hyperlipidemia)   . HTN (hypertension)   . Midsternal chest pain    cardiology eval with non-ischemic stress test 04/2012    Patient Active Problem List   Diagnosis Date Noted  . Upper airway cough syndrome 04/29/2015  . Essential hypertension 04/13/2015  . Hoarseness 04/13/2015  . Low TSH level 07/30/2014  . Neck discomfort 07/30/2014  . Goiter ? right  07/30/2014  . Trouble swallowing hx of  07/30/2014  . Visit for preventive health examination 07/14/2014  . GERD (gastroesophageal reflux disease) 01/01/2014  . Hypokalemia 12/18/2013  . Shortness of breath 12/18/2013  . Dyspnea 11/25/2013  . Complete tear of right rotator cuff 11/03/2013  . Right shoulder pain 06/30/2013  . Leg pain 05/08/2012  . Chest pressure  "fatigue" 05/08/2012  . Sleep disturbance, unspecified 05/08/2012  . Skin cyst 07/16/2011  . Cyst 07/16/2011  . Ear pain 07/02/2011  . HEPATIC CYST 09/12/2009  . ADVERSE REACTION TO MEDICATION 09/12/2009  . BACK PAIN, THORACIC REGION, LEFT 03/28/2009  . CAD, NATIVE VESSEL 12/26/2008  . CHEST PAIN, ATYPICAL 12/02/2008  . NAUSEA AND VOMITING 02/16/2008  . INTERNAL HEMORRHOIDS 01/09/2008  . EXTERNAL HEMORRHOIDS 01/09/2008  . LIPOMA NOS 09/16/2007  . HYPERLIPIDEMIA 09/16/2007    Past Surgical History:  Procedure Laterality Date  . ABDOMINAL HYSTERECTOMY     fibroid tumors  . BREAST BIOPSY    . child birth x 5    . CHOLECYSTECTOMY    . SHOULDER ARTHROSCOPY WITH ROTATOR CUFF REPAIR AND SUBACROMIAL DECOMPRESSION Right 11/03/2013   Procedure: RIGHT SHOULDER ARTHROSCOPY WITH DEBRIDEMENT, ROTATOR CUFF REPAIR AND SUBACROMIAL DECOMPRESSION, DISTAL CLAVICLE RESECTION;  Surgeon: Mcarthur Rossetti, MD;  Location: [facility];  Service: Orthopedics;  Laterality: Right;  Marland Kitchen VESICOVAGINAL FISTULA CLOSURE W/ TAH     for fibroid tumors    OB History    No data available       Home Medications    Prior to Admission medications   Medication Sig Start Date End Date Taking? Authorizing Provider  amLODipine (NORVASC) 5 MG tablet Take 1 tablet (5 mg total) by mouth  daily. 08/26/15  Yes Burnis Medin, MD  losartan-hydrochlorothiazide (HYZAAR) 100-25 MG tablet TAKE 1 TABLET BY MOUTH EVERY DAY 02/28/16  Yes Burnis Medin, MD  amLODipine (NORVASC) 5 MG tablet TAKE 1 TABLET BY MOUTH DAILY. 02/28/16   Burnis Medin, MD  diclofenac sodium (VOLTAREN) 1 % GEL  12/28/15   Historical Provider, MD  methylPREDNISolone (MEDROL DOSEPAK) 4 MG TBPK tablet  12/28/15   Historical Provider, MD    Family History Family History  Problem Relation Age of Onset  . Breast cancer Sister   . Thyroid disease Sister   . Hypertension Mother     Social History Social History  Substance Use Topics  . Smoking status: Never  Smoker  . Smokeless tobacco: Never Used  . Alcohol use No     Allergies   Lisinopril and Penicillins   Review of Systems Review of Systems  Constitutional: Negative for chills and fever.  HENT: Negative for sore throat and tinnitus.   Eyes: Negative for redness.  Respiratory: Negative for cough and shortness of breath.   Cardiovascular: Negative for chest pain and palpitations.  Gastrointestinal: Negative for abdominal pain, diarrhea, nausea and vomiting.  Genitourinary: Negative for dysuria, frequency and urgency.  Musculoskeletal: Negative for myalgias.  Skin: Negative for rash.       No lesions  Neurological: Negative for weakness.  Hematological: Does not bruise/bleed easily.  Psychiatric/Behavioral: Negative for suicidal ideas.     Physical Exam Triage Vital Signs ED Triage Vitals  Enc Vitals Group     BP 09/24/16 1811 157/58     Pulse Rate 09/24/16 1811 78     Resp 09/24/16 1811 12     Temp 09/24/16 1811 98.2 F (36.8 C)     Temp Source 09/24/16 1811 Oral     SpO2 09/24/16 1811 100 %     Weight --      Height --      Head Circumference --      Peak Flow --      Pain Score 09/24/16 1847 4     Pain Loc --      Pain Edu? --      Excl. in [place]? --    No data found.   Updated Vital Signs BP 157/58 (BP Location: Left Arm)   Pulse 78   Temp 98.2 F (36.8 C) (Oral)   Resp 12   SpO2 100%   Visual Acuity Right Eye Distance:   Left Eye Distance:   Bilateral Distance:    Right Eye Near:   Left Eye Near:    Bilateral Near:     Physical Exam  Constitutional: She is oriented to person, place, and time. She appears well-developed and well-nourished. No distress.  HENT:  Head: Normocephalic and atraumatic.  Mouth/Throat: Oropharynx is clear and moist.  Eyes: Conjunctivae and EOM are normal. Pupils are equal, round, and reactive to light. No scleral icterus.  Neck: Normal range of motion. Neck supple. No JVD present. No tracheal deviation present. No  thyromegaly present.  Cardiovascular: Normal rate, regular rhythm and normal heart sounds.  Exam reveals no gallop and no friction rub.   No murmur heard. Pulmonary/Chest: Effort normal and breath sounds normal.  Abdominal: Soft. Bowel sounds are normal. She exhibits no distension. There is no tenderness.  Musculoskeletal: Normal range of motion. She exhibits no edema.  Lymphadenopathy:    She has no cervical adenopathy.  Neurological: She is alert and oriented to person, place, and time. No  cranial nerve deficit.  Skin: Skin is warm and dry.  Psychiatric: She has a normal mood and affect. Her behavior is normal. Judgment and thought content normal.  Nursing note and vitals reviewed.    UC Treatments / Results  Labs (all labs ordered are listed, but only abnormal results are displayed) Labs Reviewed  COMPREHENSIVE METABOLIC PANEL  GAMMA GT    EKG  EKG Interpretation None       Radiology No results found.  Procedures Procedures (including critical care time)  Medications Ordered in UC Medications - No data to display   Initial Impression / Assessment and Plan / UC Course  I have reviewed the triage vital signs and the nursing notes.  Pertinent labs & imaging results that were available during my care of the patient were reviewed by me and considered in my medical decision making (see chart for details).  Clinical Course    Possibly intrahepatic biliary stones although low likelihood.  Patient is pain free at present.  H/o MRI liver order raises concern for PBC.  Will check CMP and GGT for now.  HCV screen neg.    Final Clinical Impressions(s) / UC Diagnoses   Final diagnoses:  Right upper quadrant abdominal pain    New Prescriptions New Prescriptions   No medications on file     Harrie Foreman, MD 09/24/16 2007

## 2016-09-27 NOTE — Progress Notes (Signed)
Pre visit review using our clinic review tool, if applicable. No additional management support is needed unless otherwise documented below in the visit note.  Chief Complaint  Patient presents with  . Follow-up    HPI: Kaylee Adams 65 y.o. comes in today because of a problem with right upper quadrant pain radiating to her back. She sought care in the urgent care but they were unable to complete an evaluation because blood drawing was difficult and uncomfortable. She's had this discomfort before but it would went away. Over the last 7 days she had the onset again of right upper quadrant lower chest to the back pain comes and goes to the day lasts anywhere from 30-60 seconds aches from comfortable but no associated symptoms related to eating laying position cough or breathing. Reminds her of when she did have her gallbladder problem and had it out. So because it was getting worse when she was at work on Monday she did go to get evaluated however they weren't able to get blood and she was examined but no other imaging was done. Since that time her discomfort is improving and no new symptoms. She is taking a blood pressure medicine but did take it late the day she went to the emergency room. She does have some fatigue and shortness of breath feeling that nothing specific acute bleeding diarrhea pleurisy. She works stressful job. ROS: See pertinent positives and negatives per HPI. At one time in the remote past GI physician had recommended an MRI of the liver but her insurance wouldn't cover it and she was out visiting never followed up. At that time we believe she had right-sided pain at that time. Past Medical History:  Diagnosis Date  . CAD (coronary artery disease)    a. 11/2005 Cath: nonobs dzs, 30% LAD lesion on cath ;  b. 11/2005 Echo: NL EF;  c. 04/2012 Echo: EF 55-60%, Gr 2 DD;  d. 04/2012 Ex MV: EF 72%, ST depression in recovery with NL perfusion imaging - HTN response to exercise.  . Hepatic  cyst    on ct [place]  . HLD (hyperlipidemia)   . HTN (hypertension)   . Midsternal chest pain    cardiology eval with non-ischemic stress test 04/2012    Family History  Problem Relation Age of Onset  . Breast cancer Sister   . Thyroid disease Sister   . Hypertension Mother     Social History   Social History  . Marital status: Married    Spouse name: N/A  . Number of children: 5  . Years of education: N/A   Occupational History  .  Behavioral Health   Social History Main Topics  . Smoking status: Never Smoker  . Smokeless tobacco: Never Used  . Alcohol use No  . Drug use: No  . Sexual activity: Not Asked   Other Topics Concern  . None   Social History Narrative   Married, full time Network engineer. 3 pets    Register   6-8 hours sleep     Outpatient Medications Prior to Visit  Medication Sig Dispense Refill  . amLODipine (NORVASC) 5 MG tablet TAKE 1 TABLET BY MOUTH DAILY. 90 tablet 2  . losartan-hydrochlorothiazide (HYZAAR) 100-25 MG tablet TAKE 1 TABLET BY MOUTH EVERY DAY 90 tablet 2  . amLODipine (NORVASC) 5 MG tablet Take 1 tablet (5 mg total) by mouth daily. 90 tablet 1  . diclofenac sodium (VOLTAREN) 1 % GEL   2  . methylPREDNISolone (  MEDROL DOSEPAK) 4 MG TBPK tablet   0   No facility-administered medications prior to visit.      EXAM:  BP 140/78   Temp 98.4 F (36.9 C) (Oral)   Wt 166 lb 3.2 oz (75.4 kg)   BMI 32.46 kg/m   Body mass index is 32.46 kg/m.  GENERAL: vitals reviewed and listed above, alert, oriented, appears well hydrated and in no acute distress looks well and nontoxic. HEENT: atraumatic, conjunctiva  clear, no obvious abnormalities on inspection of external nose and ears OP : no lesion edema or exudate  NECK: no obvious masses on inspection palpation  LUNGS: clear to auscultation bilaterally, no wheezes, rales or rhonchi, good air movement CV: HRRR, no clubbing cyanosis or  peripheral edema nl cap refill  Abdomen soft without  organomegaly guarding or rebound she points to the area of right under her right rib cage but there is no mass that I can tell her point tenderness. Negative squeeze test on the ribs. Negative psoas sign. MS: moves all extremities without noticeable focal  abnormality PSYCH: pleasant and cooperative, no obvious depression or anxiety BP Readings from Last 3 Encounters:  09/28/16 140/78  09/24/16 157/58  01/06/16 132/76   Wt Readings from Last 3 Encounters:  09/28/16 166 lb 3.2 oz (75.4 kg)  01/06/16 173 lb (78.5 kg)  12/06/15 174 lb 8 oz (79.2 kg)   Lab Results  Component Value Date   WBC 12.6 (H) 12/30/2015   HGB 13.8 12/30/2015   HCT 42.1 12/30/2015   PLT 350.0 12/30/2015   GLUCOSE 106 (H) 12/30/2015   CHOL 222 (H) 07/05/2016   TRIG 86.0 07/05/2016   HDL 56.70 07/05/2016   LDLDIRECT 153.2 06/23/2012   LDLCALC 148 (H) 07/05/2016   ALT 21 12/30/2015   AST 17 12/30/2015   NA 142 12/30/2015   K 3.7 12/30/2015   CL 108 12/30/2015   CREATININE 0.74 12/30/2015   BUN 18 12/30/2015   CO2 27 12/30/2015   TSH 0.54 12/30/2015   INR 0.9 11/29/2008   HGBA1C 5.7 02/12/2008    ASSESSMENT AND PLAN:  Discussed the following assessment and plan:  RUQ pain - intermiitnet atypical  getting better has had this off and on .   Abdominal pain, unspecified abdominal location - Plan: POCT Urinalysis Dipstick (Automated), Culture, Urine  Back pain, unspecified back location, unspecified back pain laterality, unspecified chronicity - Plan: POCT Urinalysis Dipstick (Automated), Culture, Urine  Flank pain, acute - Plan: POCT Urinalysis Dipstick (Automated), Culture, Urine Discussed differential diagnosis unclear her exam is reassuring consideration of abdominal ultrasound. However because it is getting better she agreed that she wanted to wait and see how things went. She is due to come in for a checkup and lab in the near future. Repeat blood pressure reading was improved make sure it is at goal  at home. Continue same medication. -Patient advised to return or notify health care team  if symptoms worsen ,persist or new concerns arise. Total visit 7mins > 50% spent counseling and coordinating care as indicated in above note and in instructions to patient .     Patient Instructions  Lets observe .  For now  And consider abd ultrasound if getting worse again .  Let us know .    On my chart also.   bp repeat was 140/78   .  Urine looks mostly clear  Checking  For infection .  If fever voniting  serious persistent pain plan reevaluation .  Standley Brooking. Panosh M.D.

## 2016-09-28 ENCOUNTER — Encounter: Payer: Self-pay | Admitting: Internal Medicine

## 2016-09-28 ENCOUNTER — Ambulatory Visit (INDEPENDENT_AMBULATORY_CARE_PROVIDER_SITE_OTHER): Payer: 59 | Admitting: Internal Medicine

## 2016-09-28 VITALS — BP 140/78 | Temp 98.4°F | Wt 166.2 lb

## 2016-09-28 DIAGNOSIS — R1011 Right upper quadrant pain: Secondary | ICD-10-CM | POA: Diagnosis not present

## 2016-09-28 DIAGNOSIS — M549 Dorsalgia, unspecified: Secondary | ICD-10-CM | POA: Diagnosis not present

## 2016-09-28 DIAGNOSIS — R109 Unspecified abdominal pain: Secondary | ICD-10-CM | POA: Diagnosis not present

## 2016-09-28 LAB — POC URINALSYSI DIPSTICK (AUTOMATED)
BILIRUBIN UA: NEGATIVE
Glucose, UA: NEGATIVE
KETONES UA: NEGATIVE
Nitrite, UA: NEGATIVE
PH UA: 8.5
Protein, UA: NEGATIVE
RBC UA: NEGATIVE
SPEC GRAV UA: 1.015
Urobilinogen, UA: 0.2

## 2016-09-28 NOTE — Patient Instructions (Addendum)
Lets observe .  For now  And consider abd ultrasound if getting worse again .  Let us know .    On my chart also.   bp repeat was 140/78   .  Urine looks mostly clear  Checking  For infection .  If fever voniting  serious persistent pain plan reevaluation .

## 2016-09-30 LAB — URINE CULTURE

## 2016-12-26 ENCOUNTER — Other Ambulatory Visit: Payer: Self-pay | Admitting: Internal Medicine

## 2016-12-27 ENCOUNTER — Telehealth: Payer: Self-pay | Admitting: Family Medicine

## 2016-12-27 ENCOUNTER — Other Ambulatory Visit: Payer: Self-pay | Admitting: Family Medicine

## 2016-12-27 DIAGNOSIS — Z0000 Encounter for general adult medical examination without abnormal findings: Secondary | ICD-10-CM

## 2016-12-27 MED FILL — LOSARTAN-HCTZ 100-25 MG TAB: 100-25 | 90 days supply | Qty: 90 | Fill #0

## 2016-12-27 MED FILL — AMLODIPINE BESYLATE 5 MG TA: 5 | 90 days supply | Qty: 90 | Fill #0

## 2016-12-27 NOTE — Telephone Encounter (Signed)
Pt now due for cpx and lab work.  I have placed the lab orders.  Please help her to make both appointments.  Thanks!!

## 2016-12-27 NOTE — Telephone Encounter (Signed)
Sent to the pharmacy by e-scribe for 90 days.  Pt now due for cpx and lab work.  Message sent to scheduling.

## 2016-12-28 ENCOUNTER — Telehealth: Payer: Self-pay

## 2016-12-28 NOTE — Telephone Encounter (Signed)
Pt presents to office with c/o abdominal pain to RUQ, nausea, emesis x1, sour taste in mouth, mid-line chest "soreness", ShOB with exertion and occasional left shoulder/arm pain. Pt denies fever or recent illness. Pt's BP is elevated at 172/91, however pt cannot remember if she took her medication this morning. Pt does have hx of GERD and UAC. Pt also had cholecystectomy.   Discussed with Dr Regis Bill. She does not feel pt needs EKG at this time. She recommends pt come in to office to be seen next week.   Scheduled pt to be seen by Dr Regis Bill 12/31/16. Pt advised that if symptoms increase or change/worsen to seek ED care. Pt voiced understanding. Nothing further needed.

## 2016-12-28 NOTE — Telephone Encounter (Signed)
Pt would like a cpx on Friday. Can I create 30 min slot? Pt has an appt on 12-31-16

## 2016-12-30 NOTE — Progress Notes (Signed)
Pre visit review using our clinic review tool, if applicable. No additional management support is needed unless otherwise documented below in the visit note.  Chief Complaint  Patient presents with  . Follow-up    Pt continues to have chest pain, nausea and a sour taste in her mouth.    HPI: Kaylee Adams 66 y.o.  Fu  Of recurrent  Upper abd pain and gi sx and chest sx   Onset around dec 21   Ongoing  And getting worse  gettsing something every day  But  Not too bad today .  No fever. But  ruq pain and then mid epigastric  .  Intermittent vomiting  Chest sore. About 30 minutes . It sour acid taste in her mouth but no specific heartburn. She has had no unintended weight loss had lost weight last year has gained some back. No diarrhea blood in stool and she is not due for colonoscopy. Remote history of cholecystectomy. Some of her symptoms remind her of those times. She is not on any medication for this and is not taking aspirin and Advil Aleve for this.   ROS: See pertinent positives and negatives per HPI. No syncope  Sob fever   Past Medical History:  Diagnosis Date  . CAD (coronary artery disease)    a. 11/2005 Cath: nonobs dzs, 30% LAD lesion on cath ;  b. 11/2005 Echo: NL EF;  c. 04/2012 Echo: EF 55-60%, Gr 2 DD;  d. 04/2012 Ex MV: EF 72%, ST depression in recovery with NL perfusion imaging - HTN response to exercise.  . Hepatic cyst    on ct [place]  . HLD (hyperlipidemia)   . HTN (hypertension)   . Midsternal chest pain    cardiology eval with non-ischemic stress test 04/2012    Family History  Problem Relation Age of Onset  . Breast cancer Sister   . Thyroid disease Sister   . Hypertension Mother     Social History   Social History  . Marital status: Married    Spouse name: N/A  . Number of children: 5  . Years of education: N/A   Occupational History  .  Behavioral Health   Social History Main Topics  . Smoking status: Never Smoker  . Smokeless tobacco: Never Used    . Alcohol use No  . Drug use: No  . Sexual activity: Not Asked   Other Topics Concern  . None   Social History Narrative   Married, full time Network engineer. 3 pets    [place]   6-8 hours sleep     Outpatient Medications Prior to Visit  Medication Sig Dispense Refill  . amLODipine (NORVASC) 5 MG tablet TAKE 1 TABLET BY MOUTH ONCE DAILY 90 tablet 0  . losartan-hydrochlorothiazide (HYZAAR) 100-25 MG tablet TAKE 1 TABLET BY MOUTH ONCE DAILY 90 tablet 0   No facility-administered medications prior to visit.      EXAM:  BP 134/70 (BP Location: Right Arm, Patient Position: Sitting, Cuff Size: Normal)   Temp 97.7 F (36.5 C) (Oral)   Wt 168 lb 9.6 oz (76.5 kg)   BMI 32.93 kg/m   Body mass index is 32.93 kg/m.  GENERAL: vitals reviewed and listed above, alert, oriented, appears well hydrated and in no acute distress HEENT: atraumatic, conjunctiva  clear, no obvious abnormalities on inspection of external nose and ears  No n icteric  NECK: no obvious masses on inspection palpation  LUNGS: clear to auscultation bilaterally, no wheezes,  rales or rhonchi, good air movement Abdomen: Soft no obvious organomegaly some tenderness in the right upper quadrant epigastrium without rebound. Negative flank pain bowel sounds are present. Appear to be normal. CV: HRRR, no clubbing cyanosis or  peripheral edema nl cap refill  MS: moves all extremities without noticeable focal  abnormality PSYCH: pleasant and cooperative, but concerned.  Wt Readings from Last 3 Encounters:  12/31/16 168 lb 9.6 oz (76.5 kg)  09/28/16 166 lb 3.2 oz (75.4 kg)  01/06/16 173 lb (78.5 kg)  Reviewed record she has had a CT in the remote past and ultrasound 2014 some hepatic cyst gallbladder absent no acute disease. Due for labs    ASSESSMENT AND PLAN:  Discussed the following assessment and plan:  RUQ pain - Plan: US Abdomen Complete, Ambulatory referral to Gastroenterology  Nausea and vomiting, intractability  of vomiting not specified, unspecified vomiting type - Intermittent over the last 3 weeks with associated right upper quadrant discomfort. No early satiety - Plan: US Abdomen Complete, Ambulatory referral to Gastroenterology Last abd [place] was 2014  Symptoms appear to be GI cause she has had a cholecystectomy course symptoms of be consistent with biliary disease. Add acid blocker Prilosec once a day for now obtain abdominal ultrasound and get GI consult. She shared with me that she now has the health savings plan with the common system is uncertain what will cover but will follow through with evaluation. Due for labs and checkup she should proceed with this. -Patient advised to return or notify health care team  if symptoms worsen ,persist or new concerns arise.  Patient Instructions  OTC .Marland Kitchen Prilosec 20 mg  once a day . To block acid .   Will plan on  Having you see  GI doctor and get ultrasound before you see them .     Vomiting is  Not normal acts like when somone  has a gall bladder   Pain.      Standley Brooking. Kia Stavros M.D.

## 2016-12-31 ENCOUNTER — Encounter: Payer: Self-pay | Admitting: Internal Medicine

## 2016-12-31 ENCOUNTER — Ambulatory Visit (INDEPENDENT_AMBULATORY_CARE_PROVIDER_SITE_OTHER): Payer: 59 | Admitting: Internal Medicine

## 2016-12-31 VITALS — BP 134/70 | Temp 97.7°F | Wt 168.6 lb

## 2016-12-31 DIAGNOSIS — R112 Nausea with vomiting, unspecified: Secondary | ICD-10-CM | POA: Diagnosis not present

## 2016-12-31 DIAGNOSIS — R1011 Right upper quadrant pain: Secondary | ICD-10-CM | POA: Diagnosis not present

## 2016-12-31 NOTE — Patient Instructions (Signed)
OTC .. Prilosec 20 mg  once a day . To block acid .   Will plan on  Having you see  GI doctor and get ultrasound before you see them .     Vomiting is  Not normal acts like when somone  has a gall bladder   Pain.

## 2016-12-31 NOTE — Telephone Encounter (Signed)
Ok

## 2016-12-31 NOTE — Telephone Encounter (Signed)
Pt has been sch

## 2017-01-01 ENCOUNTER — Telehealth: Payer: Self-pay | Admitting: Internal Medicine

## 2017-01-01 ENCOUNTER — Other Ambulatory Visit: Payer: Self-pay | Admitting: Family Medicine

## 2017-01-01 ENCOUNTER — Other Ambulatory Visit (INDEPENDENT_AMBULATORY_CARE_PROVIDER_SITE_OTHER): Payer: 59

## 2017-01-01 DIAGNOSIS — R3915 Urgency of urination: Secondary | ICD-10-CM

## 2017-01-01 DIAGNOSIS — M549 Dorsalgia, unspecified: Secondary | ICD-10-CM

## 2017-01-01 LAB — POC URINALSYSI DIPSTICK (AUTOMATED)
BILIRUBIN UA: NEGATIVE
Blood, UA: NEGATIVE
GLUCOSE UA: NEGATIVE
Ketones, UA: NEGATIVE
NITRITE UA: NEGATIVE
Protein, UA: NEGATIVE
Spec Grav, UA: 1.015
UROBILINOGEN UA: 0.2
pH, UA: 7

## 2017-01-01 NOTE — Telephone Encounter (Signed)
Spoke to the pt.  She denied fever.   She is also having pressure in her abdomen and lower back pain.  Sx started a month ago.

## 2017-01-01 NOTE — Telephone Encounter (Signed)
Advised Dr. Mamie Nick that pt is not scheduled for lab work until 01/25/17.  Per WP, have pt come for urine tests.  Orders placed and pt will come today @ 4 PM.

## 2017-01-01 NOTE — Telephone Encounter (Signed)
Please order  Urinalysis and  Urine culture with her labs  thanks

## 2017-01-01 NOTE — Telephone Encounter (Signed)
Pt states she forgot to mention yesterday she is having urinary frequency with occasional odor. Wants to know if she should have a UA?

## 2017-01-03 LAB — URINE CULTURE

## 2017-01-09 NOTE — Progress Notes (Signed)
Tell pt urine culture showed multiple bacteria but not predominant germ. Cannot tell if she has  UTI. But most likely not a uti

## 2017-01-11 ENCOUNTER — Other Ambulatory Visit: Payer: 59

## 2017-01-18 ENCOUNTER — Ambulatory Visit
Admission: RE | Admit: 2017-01-18 | Discharge: 2017-01-18 | Disposition: A | Discharge status: Home / Self Care | Payer: 59 | Source: Ambulatory Visit | Attending: Internal Medicine | Admitting: Internal Medicine

## 2017-01-18 ENCOUNTER — Other Ambulatory Visit (INDEPENDENT_AMBULATORY_CARE_PROVIDER_SITE_OTHER): Payer: 59

## 2017-01-18 DIAGNOSIS — R1011 Right upper quadrant pain: Secondary | ICD-10-CM

## 2017-01-18 DIAGNOSIS — R112 Nausea with vomiting, unspecified: Secondary | ICD-10-CM

## 2017-01-18 DIAGNOSIS — Z0000 Encounter for general adult medical examination without abnormal findings: Secondary | ICD-10-CM | POA: Diagnosis not present

## 2017-01-18 LAB — CBC WITH DIFFERENTIAL/PLATELET
Basophils Absolute: 0 10*3/uL (ref 0.0–0.1)
Basophils Relative: 0.2 % (ref 0.0–3.0)
Eosinophils Absolute: 0 10*3/uL (ref 0.0–0.7)
Eosinophils Relative: 0 % (ref 0.0–5.0)
HCT: 40.1 % (ref 36.0–46.0)
Hemoglobin: 13.3 g/dL (ref 12.0–15.0)
LYMPHS ABS: 1.9 10*3/uL (ref 0.7–4.0)
Lymphocytes Relative: 41.6 % (ref 12.0–46.0)
MCHC: 33.2 g/dL (ref 30.0–36.0)
MCV: 78.2 fl (ref 78.0–100.0)
MONO ABS: 0.3 10*3/uL (ref 0.1–1.0)
MONOS PCT: 7.3 % (ref 3.0–12.0)
NEUTROS PCT: 50.9 % (ref 43.0–77.0)
Neutro Abs: 2.3 10*3/uL (ref 1.4–7.7)
Platelets: 304 10*3/uL (ref 150.0–400.0)
RBC: 5.13 Mil/uL — AB (ref 3.87–5.11)
RDW: 13.4 % (ref 11.5–15.5)
WBC: 4.6 10*3/uL (ref 4.0–10.5)

## 2017-01-18 LAB — BASIC METABOLIC PANEL
BUN: 18 mg/dL (ref 6–23)
CALCIUM: 10.1 mg/dL (ref 8.4–10.5)
CO2: 32 mEq/L (ref 19–32)
Chloride: 106 mEq/L (ref 96–112)
Creatinine, Ser: 0.8 mg/dL (ref 0.40–1.20)
GFR: 92.57 mL/min (ref >60.00)
GLUCOSE: 91 mg/dL (ref 70–99)
Potassium: 4 mEq/L (ref 3.5–5.1)
SODIUM: 143 meq/L (ref 135–145)

## 2017-01-18 LAB — HEPATIC FUNCTION PANEL
ALBUMIN: 4 g/dL (ref 3.5–5.2)
ALK PHOS: 47 U/L (ref 39–117)
ALT: 21 U/L (ref 0–35)
AST: 17 U/L (ref 0–37)
BILIRUBIN DIRECT: 0.1 mg/dL (ref 0.0–0.3)
TOTAL PROTEIN: 6.6 g/dL (ref 6.0–8.3)
Total Bilirubin: 0.5 mg/dL (ref 0.2–1.2)

## 2017-01-18 LAB — LIPID PANEL
CHOLESTEROL: 237 mg/dL — AB (ref 0–200)
HDL: 49.7 mg/dL (ref >39.00)
LDL Cholesterol: 165 mg/dL — ABNORMAL HIGH (ref 0–99)
NonHDL: 187.72
Total CHOL/HDL Ratio: 5
Triglycerides: 115 mg/dL (ref 0.0–149.0)
VLDL: 23 mg/dL (ref 0.0–40.0)

## 2017-01-18 LAB — TSH: TSH: 0.77 u[IU]/mL (ref 0.35–4.50)

## 2017-01-25 ENCOUNTER — Other Ambulatory Visit: Payer: 59

## 2017-01-30 NOTE — Progress Notes (Signed)
Pre visit review using our clinic review tool, if applicable. No additional management support is needed unless otherwise documented below in the visit note.  Chief Complaint  Patient presents with  . Annual Exam    HPI: Patient  Kaylee Adams  66 y.o. comes in today for Preventive Health Care visit   Last dexa  A year ago in gyne office    Tired and upper discomfort  Nausea a bit  Not bad. And right upper abdominal pain is better this week. She has gained some weight snores at night but no sleep apnea 6 or 7 hours not really refreshed by sleep working full time.  Health Maintenance  Topic Date Due  . DEXA SCAN  01/10/2017  . ZOSTAVAX  01/31/2018 (Originally 01/11/2012)  . PAP SMEAR  08/04/2017  . MAMMOGRAM  10/24/2017  . PNA vac Low Risk Adult (2 of 2 - PPSV23) 02/01/2018  . COLONOSCOPY  12/30/2018  . TETANUS/TDAP  02/01/2027  . INFLUENZA VACCINE  Addressed  . Hepatitis C Screening  Completed  . HIV Screening  Completed   Health Maintenance Review LIFESTYLE:  Exercise:   Not recenetly not felt  Tobacco/ETS:bi Alcohol:  no Sugar beverages: Sleep: 6-7   Husband says snores  No os sx otherwise  Fatigue afgter  Sleep  No ? osa  Drug use: no HH of   2  Work: 40     ROS:  See above GEN/ HEENT: No fever, significant weight changes sweats headaches vision problems hearing changes, CV/ PULM; No chest pain shortness of breath cough, syncope,edema  change in exercise tolerance. GI /GU: No adominal pain, vomiting, change in bowel habits. No blood in the stool. No significant GU symptoms. SKIN/HEME: ,no acute skin rashes suspicious lesions or bleeding. No lymphadenopathy, nodules, masses.  NEURO/ PSYCH:  No neurologic signs such as weakness numbness. No depression anxiety. IMM/ Allergy: No unusual infections.  Allergy .   REST of 12 system review negative except as per HPI   Past Medical History:  Diagnosis Date  . CAD (coronary artery disease)    a. 11/2005 Cath: nonobs  dzs, 30% LAD lesion on cath ;  b. 11/2005 Echo: NL EF;  c. 04/2012 Echo: EF 55-60%, Gr 2 DD;  d. 04/2012 Ex MV: EF 72%, ST depression in recovery with NL perfusion imaging - HTN response to exercise.  . Hepatic cyst    on ct [place]  . HLD (hyperlipidemia)   . HTN (hypertension)   . Midsternal chest pain    cardiology eval with non-ischemic stress test 04/2012    Past Surgical History:  Procedure Laterality Date  . ABDOMINAL HYSTERECTOMY     fibroid tumors  . BREAST BIOPSY    . child birth x 5    . CHOLECYSTECTOMY    . SHOULDER ARTHROSCOPY WITH ROTATOR CUFF REPAIR AND SUBACROMIAL DECOMPRESSION Right 11/03/2013   Procedure: RIGHT SHOULDER ARTHROSCOPY WITH DEBRIDEMENT, ROTATOR CUFF REPAIR AND SUBACROMIAL DECOMPRESSION, DISTAL CLAVICLE RESECTION;  Surgeon: Mcarthur Rossetti, MD;  Location: [place];  Service: Orthopedics;  Laterality: Right;  Marland Kitchen VESICOVAGINAL FISTULA CLOSURE W/ TAH     for fibroid tumors    Family History  Problem Relation Age of Onset  . Breast cancer Sister   . Thyroid disease Sister   . Hypertension Mother     Social History   Social History  . Marital status: Married    Spouse name: N/A  . Number of children: 5  . Years of education: N/A  Occupational History  .  Behavioral Health   Social History Main Topics  . Smoking status: Never Smoker  . Smokeless tobacco: Never Used  . Alcohol use No  . Drug use: No  . Sexual activity: Not Asked   Other Topics Concern  . None   Social History Narrative   Married, full time Network engineer. 3 pets    [place]   6-8 hours sleep     Outpatient Medications Prior to Visit  Medication Sig Dispense Refill  . amLODipine (NORVASC) 5 MG tablet TAKE 1 TABLET BY MOUTH ONCE DAILY 90 tablet 0  . losartan-hydrochlorothiazide (HYZAAR) 100-25 MG tablet TAKE 1 TABLET BY MOUTH ONCE DAILY 90 tablet 0   No facility-administered medications prior to visit.      EXAM:  BP 136/70 (BP Location: Left Arm, Patient Position:  Sitting, Cuff Size: Normal)   Temp 98.3 F (36.8 C) (Oral)   Ht 5' (1.524 m)   Wt 170 lb (77.1 kg)   BMI 33.20 kg/m   Body mass index is 33.2 kg/m. Wt Readings from Last 3 Encounters:  02/01/17 170 lb (77.1 kg)  12/31/16 168 lb 9.6 oz (76.5 kg)  09/28/16 166 lb 3.2 oz (75.4 kg)    Physical Exam: Vital signs reviewed NTI:RWER is a well-developed well-nourished alert cooperative    who appearsr stated age in no acute distress.  HEENT: normocephalic atraumatic , Eyes: PERRL EOM's full, conjunctiva clear, Nares: paten,t no deformity discharge or tenderness., Ears: no deformity EAC's clear TMs with normal landmarks. Mouth: clear OP, no lesions, edema.  Moist mucous membranes. Dentition in adequate repair. NECK: supple without masses, thyromegaly or bruits. CHEST/PULM:  Clear to auscultation and percussion breath sounds equal no wheeze , rales or rhonchi. No chest wall deformities or tenderness. Breast: normal by inspection . No dimpling, discharge, masses, tenderness or discharge . CV: PMI is nondisplaced, S1 S2 no gallops, murmurs, rubs. Peripheral pulses are full without delay.No JVD .  ABDOMEN: Bowel sounds normal nontender  No guard or rebound, no hepato splenomegal no CVA tenderness.  No hernia. Extremtities:  No clubbing cyanosis or edema, no acute joint swelling or redness no focal atrophy  Hammer toe second  NEURO:  Oriented x3, cranial nerves 3-12 appear to be intact, no obvious focal weakness,gait within normal limits no abnormal reflexes or asymmetrical SKIN: No acute rashes normal turgor, color, no bruising or petechiae. PSYCH: Oriented, good eye contact, no obvious depression anxiety, cognition and judgment appear normal. LN: no cervical axillary inguinal adenopathy  Lab Results  Component Value Date   WBC 4.6 01/18/2017   HGB 13.3 01/18/2017   HCT 40.1 01/18/2017   PLT 304.0 01/18/2017   GLUCOSE 91 01/18/2017   CHOL 237 (H) 01/18/2017   TRIG 115.0 01/18/2017   HDL  49.70 01/18/2017   LDLDIRECT 153.2 06/23/2012   LDLCALC 165 (H) 01/18/2017   ALT 21 01/18/2017   AST 17 01/18/2017   NA 143 01/18/2017   K 4.0 01/18/2017   CL 106 01/18/2017   CREATININE 0.80 01/18/2017   BUN 18 01/18/2017   CO2 32 01/18/2017   TSH 0.77 01/18/2017   INR 0.9 11/29/2008   HGBA1C 5.7 02/12/2008    BP Readings from Last 3 Encounters:  02/01/17 136/70  12/31/16 134/70  09/28/16 140/78   ua 1+  Mult spc Lab results reviewed with patient  Reviewed [place]  abd  No alarm features  Hepatic cysts  Wt Readings from Last 3 Encounters:  02/01/17 170 lb (  77.1 kg)  12/31/16 168 lb 9.6 oz (76.5 kg)  09/28/16 166 lb 3.2 oz (75.4 kg)    ASSESSMENT AND PLAN:  Discussed the following assessment and plan:  Visit for preventive health examination - tdap and  prevnar today dwexa in 3-4 years last reported nl  Hyperlipidemia, unspecified hyperlipidemia type  Essential hypertension - controlled  Medication management  RUQ pain  Need for Tdap vaccination - Plan: Tdap vaccine greater than or equal to 7yo IM  Need for vaccination with 13-polyvalent pneumococcal conjugate vaccine - Plan: Pneumococcal conjugate vaccine 13-valent  Erythrocytosis - mild  beeter  no other findinsg  follow  consdier futhre eval if progressive  snores at night but no OSA sx .  unrefreqhed by sleep  Her weight and her lipids have gone off a bit. She plans on getting back on healthy eating and lifestyle as she felt much better when she had lost 11 pounds. Sleep is not as refreshed no obvious obstructive sleep apnea but does do snoring. Monitor this over time consideration of evaluation is borderline elevated rbc. Follow-up in 4-5 months with a lipid panel and CBC pre-visit keep follow-up with GI department. Uncertain cause of her right upper quadrant symptoms. Elevated rbc  Patient Care Team: Burnis Medin, MD as PCP - General Milus Banister, MD (Gastroenterology) Fay Records, MD  (Cardiology) Patient Instructions  Intensify lifestyle interventions. This may help your sleep in case you have a mild sleep apnea your cholesterol in your fatigue level. Your ultrasound and lab tests are reassuring except for your cholesterol being up. Get back on your healthy eating and enough sleep. Plan follow-up visit in about 4 -5 months. We should follow her CBC and your cholesterol panel. Pre visit   Can dexa about every 3-5 years .    Health Maintenance, Female Introduction Adopting a healthy lifestyle and getting preventive care can go a long way to promote health and wellness. Talk with your health care provider about what schedule of regular examinations is right for you. This is a good chance for you to check in with your provider about disease prevention and staying healthy. In between checkups, there are plenty of things you can do on your own. Experts have done a lot of research about which lifestyle changes and preventive measures are most likely to keep you healthy. Ask your health care provider for more information. Weight and diet Eat a healthy diet  Be sure to include plenty of vegetables, fruits, low-fat dairy products, and lean protein.  Do not eat a lot of foods high in solid fats, added sugars, or salt.  Get regular exercise. This is one of the most important things you can do for your health.  Most adults should exercise for at least 150 minutes each week. The exercise should increase your heart rate and make you sweat (moderate-intensity exercise).  Most adults should also do strengthening exercises at least twice a week. This is in addition to the moderate-intensity exercise. Maintain a healthy weight  Body mass index (BMI) is a measurement that can be used to identify possible weight problems. It estimates body fat based on height and weight. Your health care provider can help determine your BMI and help you achieve or maintain a healthy weight.  For  females 35 years of age and older:  A BMI below 18.5 is considered underweight.  A BMI of 18.5 to 24.9 is normal.  A BMI of 25 to 29.9 is considered overweight.  A BMI of 30 and above is considered obese. Watch levels of cholesterol and blood lipids  You should start having your blood tested for lipids and cholesterol at 65 years of age, then have this test every 5 years.  You may need to have your cholesterol levels checked more often if:  Your lipid or cholesterol levels are high.  You are older than 65 years of age.  You are at high risk for heart disease. Cancer screening Lung Cancer  Lung cancer screening is recommended for adults 33-72 years old who are at high risk for lung cancer because of a history of smoking.  A yearly low-dose CT scan of the lungs is recommended for people who:  Currently smoke.  Have quit within the past 15 years.  Have at least a 30-pack-year history of smoking. A pack year is smoking an average of one pack of cigarettes a day for 1 year.  Yearly screening should continue until it has been 15 years since you quit.  Yearly screening should stop if you develop a health problem that would prevent you from having lung cancer treatment. Breast Cancer  Practice breast self-awareness. This means understanding how your breasts normally appear and feel.  It also means doing regular breast self-exams. Let your health care provider know about any changes, no matter how small.  If you are in your 20s or 30s, you should have a clinical breast exam (CBE) by a health care provider every 1-3 years as part of a regular health exam.  If you are 75 or older, have a CBE every year. Also consider having a breast X-ray (mammogram) every year.  If you have a family history of breast cancer, talk to your health care provider about genetic screening.  If you are at high risk for breast cancer, talk to your health care provider about having an MRI and a mammogram  every year.  Breast cancer gene (BRCA) assessment is recommended for women who have family members with BRCA-related cancers. BRCA-related cancers include:  Breast.  Ovarian.  Tubal.  Peritoneal cancers.  Results of the assessment will determine the need for genetic counseling and BRCA1 and BRCA2 testing. Cervical Cancer  Your health care provider may recommend that you be screened regularly for cancer of the pelvic organs (ovaries, uterus, and vagina). This screening involves a pelvic examination, including checking for microscopic changes to the surface of your cervix (Pap test). You may be encouraged to have this screening done every 3 years, beginning at age 48.  For women ages 63-65, health care providers may recommend pelvic exams and Pap testing every 3 years, or they may recommend the Pap and pelvic exam, combined with testing for human papilloma virus (HPV), every 5 years. Some types of HPV increase your risk of cervical cancer. Testing for HPV may also be done on women of any age with unclear Pap test results.  Other health care providers may not recommend any screening for nonpregnant women who are considered low risk for pelvic cancer and who do not have symptoms. Ask your health care provider if a screening pelvic exam is right for you.  If you have had past treatment for cervical cancer or a condition that could lead to cancer, you need Pap tests and screening for cancer for at least 20 years after your treatment. If Pap tests have been discontinued, your risk factors (such as having a new sexual partner) need to be reassessed to determine if screening should resume. Some  women have medical problems that increase the chance of getting cervical cancer. In these cases, your health care provider may recommend more frequent screening and Pap tests. Colorectal Cancer  This type of cancer can be detected and often prevented.  Routine colorectal cancer screening usually begins at 65  years of age and continues through 65 years of age.  Your health care provider may recommend screening at an earlier age if you have risk factors for colon cancer.  Your health care provider may also recommend using home test kits to check for hidden blood in the stool.  A small camera at the end of a tube can be used to examine your colon directly (sigmoidoscopy or colonoscopy). This is done to check for the earliest forms of colorectal cancer.  Routine screening usually begins at age 12.  Direct examination of the colon should be repeated every 5-10 years through 65 years of age. However, you may need to be screened more often if early forms of precancerous polyps or small growths are found. Skin Cancer  Check your skin from head to toe regularly.  Tell your health care provider about any new moles or changes in moles, especially if there is a change in a mole's shape or color.  Also tell your health care provider if you have a mole that is larger than the size of a pencil eraser.  Always use sunscreen. Apply sunscreen liberally and repeatedly throughout the day.  Protect yourself by wearing long sleeves, pants, a wide-brimmed hat, and sunglasses whenever you are outside. Heart disease, diabetes, and high blood pressure  High blood pressure causes heart disease and increases the risk of stroke. High blood pressure is more likely to develop in:  People who have blood pressure in the high end of the normal range (130-139/85-89 mm Hg).  People who are overweight or obese.  People who are African American.  If you are 49-24 years of age, have your blood pressure checked every 3-5 years. If you are 14 years of age or older, have your blood pressure checked every year. You should have your blood pressure measured twice-once when you are at a hospital or clinic, and once when you are not at a hospital or clinic. Record the average of the two measurements. To check your blood pressure when  you are not at a hospital or clinic, you can use:  An automated blood pressure machine at a pharmacy.  A home blood pressure monitor.  If you are between 50 years and 76 years old, ask your health care provider if you should take aspirin to prevent strokes.  Have regular diabetes screenings. This involves taking a blood sample to check your fasting blood sugar level.  If you are at a normal weight and have a low risk for diabetes, have this test once every three years after 65 years of age.  If you are overweight and have a high risk for diabetes, consider being tested at a younger age or more often. Preventing infection Hepatitis B  If you have a higher risk for hepatitis B, you should be screened for this virus. You are considered at high risk for hepatitis B if:  You were born in a country where hepatitis B is common. Ask your health care provider which countries are considered high risk.  Your parents were born in a high-risk country, and you have not been immunized against hepatitis B (hepatitis B vaccine).  You have HIV or AIDS.  You use needles  to inject street drugs.  You live with someone who has hepatitis B.  You have had sex with someone who has hepatitis B.  You get hemodialysis treatment.  You take certain medicines for conditions, including cancer, organ transplantation, and autoimmune conditions. Hepatitis C  Blood testing is recommended for:  Everyone born from [birth year] through 1964.  Anyone with known risk factors for hepatitis C. Sexually transmitted infections (STIs)  You should be screened for sexually transmitted infections (STIs) including gonorrhea and chlamydia if:  You are sexually active and are younger than 66 years of age.  You are older than 65 years of age and your health care provider tells you that you are at risk for this type of infection.  Your sexual activity has changed since you were last screened and you are at an increased risk for  chlamydia or gonorrhea. Ask your health care provider if you are at risk.  If you do not have HIV, but are at risk, it may be recommended that you take a prescription medicine daily to prevent HIV infection. This is called pre-exposure prophylaxis (PrEP). You are considered at risk if:  You are sexually active and do not regularly use condoms or know the HIV status of your partner(s).  You take drugs by injection.  You are sexually active with a partner who has HIV. Talk with your health care provider about whether you are at high risk of being infected with HIV. If you choose to begin PrEP, you should first be tested for HIV. You should then be tested every 3 months for as long as you are taking PrEP. Pregnancy  If you are premenopausal and you may become pregnant, ask your health care provider about preconception counseling.  If you may become pregnant, take 400 to 800 micrograms (mcg) of folic acid every day.  If you want to prevent pregnancy, talk to your health care provider about birth control (contraception). Osteoporosis and menopause  Osteoporosis is a disease in which the bones lose minerals and strength with aging. This can result in serious bone fractures. Your risk for osteoporosis can be identified using a bone density scan.  If you are 58 years of age or older, or if you are at risk for osteoporosis and fractures, ask your health care provider if you should be screened.  Ask your health care provider whether you should take a calcium or vitamin D supplement to lower your risk for osteoporosis.  Menopause may have certain physical symptoms and risks.  Hormone replacement therapy may reduce some of these symptoms and risks. Talk to your health care provider about whether hormone replacement therapy is right for you. Follow these instructions at home:  Schedule regular health, dental, and eye exams.  Stay current with your immunizations.  Do not use any tobacco products  including cigarettes, chewing tobacco, or electronic cigarettes.  If you are pregnant, do not drink alcohol.  If you are breastfeeding, limit how much and how often you drink alcohol.  Limit alcohol intake to no more than 1 drink per day for nonpregnant women. One drink equals 12 ounces of beer, 5 ounces of wine, or 1 ounces of hard liquor.  Do not use street drugs.  Do not share needles.  Ask your health care provider for help if you need support or information about quitting drugs.  Tell your health care provider if you often feel depressed.  Tell your health care provider if you have ever been abused or do not  feel safe at home. This information is not intended to replace advice given to you by your health care provider. Make sure you discuss any questions you have with your health care provider. Document Released: 06/25/2011 Document Revised: 05/17/2016 Document Reviewed: 09/13/2015  2017 [employer]vier    Mariann Laster K. Krisy Dix M.D.

## 2017-02-01 ENCOUNTER — Encounter: Payer: Self-pay | Admitting: Internal Medicine

## 2017-02-01 ENCOUNTER — Ambulatory Visit (INDEPENDENT_AMBULATORY_CARE_PROVIDER_SITE_OTHER): Payer: 59 | Admitting: Internal Medicine

## 2017-02-01 VITALS — BP 136/70 | Temp 98.3°F | Ht 60.0 in | Wt 170.0 lb

## 2017-02-01 DIAGNOSIS — D751 Secondary polycythemia: Secondary | ICD-10-CM

## 2017-02-01 DIAGNOSIS — I10 Essential (primary) hypertension: Secondary | ICD-10-CM

## 2017-02-01 DIAGNOSIS — Z23 Encounter for immunization: Secondary | ICD-10-CM

## 2017-02-01 DIAGNOSIS — Z0000 Encounter for general adult medical examination without abnormal findings: Secondary | ICD-10-CM | POA: Diagnosis not present

## 2017-02-01 DIAGNOSIS — Z79899 Other long term (current) drug therapy: Secondary | ICD-10-CM | POA: Diagnosis not present

## 2017-02-01 DIAGNOSIS — R1011 Right upper quadrant pain: Secondary | ICD-10-CM

## 2017-02-01 DIAGNOSIS — E785 Hyperlipidemia, unspecified: Secondary | ICD-10-CM

## 2017-02-01 NOTE — Patient Instructions (Signed)
Intensify lifestyle interventions. This may help your sleep in case you have a mild sleep apnea your cholesterol in your fatigue level. Your ultrasound and lab tests are reassuring except for your cholesterol being up. Get back on your healthy eating and enough sleep. Plan follow-up visit in about 4 -5 months. We should follow her CBC and your cholesterol panel. Pre visit   Can dexa about every 3-5 years .    Health Maintenance, Female Introduction Adopting a healthy lifestyle and getting preventive care can go a long way to promote health and wellness. Talk with your health care provider about what schedule of regular examinations is right for you. This is a good chance for you to check in with your provider about disease prevention and staying healthy. In between checkups, there are plenty of things you can do on your own. Experts have done a lot of research about which lifestyle changes and preventive measures are most likely to keep you healthy. Ask your health care provider for more information. Weight and diet Eat a healthy diet  Be sure to include plenty of vegetables, fruits, low-fat dairy products, and lean protein.  Do not eat a lot of foods high in solid fats, added sugars, or salt.  Get regular exercise. This is one of the most important things you can do for your health.  Most adults should exercise for at least 150 minutes each week. The exercise should increase your heart rate and make you sweat (moderate-intensity exercise).  Most adults should also do strengthening exercises at least twice a week. This is in addition to the moderate-intensity exercise. Maintain a healthy weight  Body mass index (BMI) is a measurement that can be used to identify possible weight problems. It estimates body fat based on height and weight. Your health care provider can help determine your BMI and help you achieve or maintain a healthy weight.  For females 46 years of age and older:  A BMI  below 18.5 is considered underweight.  A BMI of 18.5 to 24.9 is normal.  A BMI of 25 to 29.9 is considered overweight.  A BMI of 30 and above is considered obese. Watch levels of cholesterol and blood lipids  You should start having your blood tested for lipids and cholesterol at 65 years of age, then have this test every 5 years.  You may need to have your cholesterol levels checked more often if:  Your lipid or cholesterol levels are high.  You are older than 65 years of age.  You are at high risk for heart disease. Cancer screening Lung Cancer  Lung cancer screening is recommended for adults 14-54 years old who are at high risk for lung cancer because of a history of smoking.  A yearly low-dose CT scan of the lungs is recommended for people who:  Currently smoke.  Have quit within the past 15 years.  Have at least a 30-pack-year history of smoking. A pack year is smoking an average of one pack of cigarettes a day for 1 year.  Yearly screening should continue until it has been 15 years since you quit.  Yearly screening should stop if you develop a health problem that would prevent you from having lung cancer treatment. Breast Cancer  Practice breast self-awareness. This means understanding how your breasts normally appear and feel.  It also means doing regular breast self-exams. Let your health care provider know about any changes, no matter how small.  If you are in your 45s  or 83s, you should have a clinical breast exam (CBE) by a health care provider every 1-3 years as part of a regular health exam.  If you are 3 or older, have a CBE every year. Also consider having a breast X-ray (mammogram) every year.  If you have a family history of breast cancer, talk to your health care provider about genetic screening.  If you are at high risk for breast cancer, talk to your health care provider about having an MRI and a mammogram every year.  Breast cancer gene (BRCA)  assessment is recommended for women who have family members with BRCA-related cancers. BRCA-related cancers include:  Breast.  Ovarian.  Tubal.  Peritoneal cancers.  Results of the assessment will determine the need for genetic counseling and BRCA1 and BRCA2 testing. Cervical Cancer  Your health care provider may recommend that you be screened regularly for cancer of the pelvic organs (ovaries, uterus, and vagina). This screening involves a pelvic examination, including checking for microscopic changes to the surface of your cervix (Pap test). You may be encouraged to have this screening done every 3 years, beginning at age 53.  For women ages 94-65, health care providers may recommend pelvic exams and Pap testing every 3 years, or they may recommend the Pap and pelvic exam, combined with testing for human papilloma virus (HPV), every 5 years. Some types of HPV increase your risk of cervical cancer. Testing for HPV may also be done on women of any age with unclear Pap test results.  Other health care providers may not recommend any screening for nonpregnant women who are considered low risk for pelvic cancer and who do not have symptoms. Ask your health care provider if a screening pelvic exam is right for you.  If you have had past treatment for cervical cancer or a condition that could lead to cancer, you need Pap tests and screening for cancer for at least 20 years after your treatment. If Pap tests have been discontinued, your risk factors (such as having a new sexual partner) need to be reassessed to determine if screening should resume. Some women have medical problems that increase the chance of getting cervical cancer. In these cases, your health care provider may recommend more frequent screening and Pap tests. Colorectal Cancer  This type of cancer can be detected and often prevented.  Routine colorectal cancer screening usually begins at 65 years of age and continues through 66  years of age.  Your health care provider may recommend screening at an earlier age if you have risk factors for colon cancer.  Your health care provider may also recommend using home test kits to check for hidden blood in the stool.  A small camera at the end of a tube can be used to examine your colon directly (sigmoidoscopy or colonoscopy). This is done to check for the earliest forms of colorectal cancer.  Routine screening usually begins at age 73.  Direct examination of the colon should be repeated every 5-10 years through 65 years of age. However, you may need to be screened more often if early forms of precancerous polyps or small growths are found. Skin Cancer  Check your skin from head to toe regularly.  Tell your health care provider about any new moles or changes in moles, especially if there is a change in a mole's shape or color.  Also tell your health care provider if you have a mole that is larger than the size of a pencil eraser.  Always use sunscreen. Apply sunscreen liberally and repeatedly throughout the day.  Protect yourself by wearing long sleeves, pants, a wide-brimmed hat, and sunglasses whenever you are outside. Heart disease, diabetes, and high blood pressure  High blood pressure causes heart disease and increases the risk of stroke. High blood pressure is more likely to develop in:  People who have blood pressure in the high end of the normal range (130-139/85-89 mm Hg).  People who are overweight or obese.  People who are African American.  If you are 45-24 years of age, have your blood pressure checked every 3-5 years. If you are 81 years of age or older, have your blood pressure checked every year. You should have your blood pressure measured twice-once when you are at a hospital or clinic, and once when you are not at a hospital or clinic. Record the average of the two measurements. To check your blood pressure when you are not at a hospital or clinic,  you can use:  An automated blood pressure machine at a pharmacy.  A home blood pressure monitor.  If you are between 2 years and 68 years old, ask your health care provider if you should take aspirin to prevent strokes.  Have regular diabetes screenings. This involves taking a blood sample to check your fasting blood sugar level.  If you are at a normal weight and have a low risk for diabetes, have this test once every three years after 65 years of age.  If you are overweight and have a high risk for diabetes, consider being tested at a younger age or more often. Preventing infection Hepatitis B  If you have a higher risk for hepatitis B, you should be screened for this virus. You are considered at high risk for hepatitis B if:  You were born in a country where hepatitis B is common. Ask your health care provider which countries are considered high risk.  Your parents were born in a high-risk country, and you have not been immunized against hepatitis B (hepatitis B vaccine).  You have HIV or AIDS.  You use needles to inject street drugs.  You live with someone who has hepatitis B.  You have had sex with someone who has hepatitis B.  You get hemodialysis treatment.  You take certain medicines for conditions, including cancer, organ transplantation, and autoimmune conditions. Hepatitis C  Blood testing is recommended for:  Everyone born from [birth year] through 1964.  Anyone with known risk factors for hepatitis C. Sexually transmitted infections (STIs)  You should be screened for sexually transmitted infections (STIs) including gonorrhea and chlamydia if:  You are sexually active and are younger than 65 years of age.  You are older than 65 years of age and your health care provider tells you that you are at risk for this type of infection.  Your sexual activity has changed since you were last screened and you are at an increased risk for chlamydia or gonorrhea. Ask your  health care provider if you are at risk.  If you do not have HIV, but are at risk, it may be recommended that you take a prescription medicine daily to prevent HIV infection. This is called pre-exposure prophylaxis (PrEP). You are considered at risk if:  You are sexually active and do not regularly use condoms or know the HIV status of your partner(s).  You take drugs by injection.  You are sexually active with a partner who has HIV. Talk with your health care provider  about whether you are at high risk of being infected with HIV. If you choose to begin PrEP, you should first be tested for HIV. You should then be tested every 3 months for as long as you are taking PrEP. Pregnancy  If you are premenopausal and you may become pregnant, ask your health care provider about preconception counseling.  If you may become pregnant, take 400 to 800 micrograms (mcg) of folic acid every day.  If you want to prevent pregnancy, talk to your health care provider about birth control (contraception). Osteoporosis and menopause  Osteoporosis is a disease in which the bones lose minerals and strength with aging. This can result in serious bone fractures. Your risk for osteoporosis can be identified using a bone density scan.  If you are 56 years of age or older, or if you are at risk for osteoporosis and fractures, ask your health care provider if you should be screened.  Ask your health care provider whether you should take a calcium or vitamin D supplement to lower your risk for osteoporosis.  Menopause may have certain physical symptoms and risks.  Hormone replacement therapy may reduce some of these symptoms and risks. Talk to your health care provider about whether hormone replacement therapy is right for you. Follow these instructions at home:  Schedule regular health, dental, and eye exams.  Stay current with your immunizations.  Do not use any tobacco products including cigarettes, chewing  tobacco, or electronic cigarettes.  If you are pregnant, do not drink alcohol.  If you are breastfeeding, limit how much and how often you drink alcohol.  Limit alcohol intake to no more than 1 drink per day for nonpregnant women. One drink equals 12 ounces of beer, 5 ounces of wine, or 1 ounces of hard liquor.  Do not use street drugs.  Do not share needles.  Ask your health care provider for help if you need support or information about quitting drugs.  Tell your health care provider if you often feel depressed.  Tell your health care provider if you have ever been abused or do not feel safe at home. This information is not intended to replace advice given to you by your health care provider. Make sure you discuss any questions you have with your health care provider. Document Released: 06/25/2011 Document Revised: 05/17/2016 Document Reviewed: 09/13/2015  2017 [employer]vier

## 2017-02-04 ENCOUNTER — Encounter (HOSPITAL_COMMUNITY): Payer: Self-pay | Admitting: Emergency Medicine

## 2017-02-04 ENCOUNTER — Ambulatory Visit (HOSPITAL_COMMUNITY)
Admission: EM | Admit: 2017-02-04 | Discharge: 2017-02-04 | Disposition: A | Discharge status: Home / Self Care | Payer: 59 | Attending: Family Medicine | Admitting: Family Medicine

## 2017-02-04 ENCOUNTER — Telehealth: Payer: Self-pay | Admitting: Internal Medicine

## 2017-02-04 DIAGNOSIS — M779 Enthesopathy, unspecified: Secondary | ICD-10-CM | POA: Diagnosis not present

## 2017-02-04 DIAGNOSIS — M778 Other enthesopathies, not elsewhere classified: Secondary | ICD-10-CM

## 2017-02-04 MED ORDER — DEXTROSE 50 % IV SOLN
INTRAVENOUS | Status: AC
  Filled 2017-02-04: qty 50

## 2017-02-04 NOTE — ED Provider Notes (Signed)
[facility]    CSN: [id number] Arrival date & time: 02/04/17  1122     History   Chief Complaint Chief Complaint  Patient presents with  . finger numbness    HPI GIARA ZORA is a 66 y.o. female.   The history is provided by the patient and the spouse.  Hand Pain  This is a new problem. The current episode started 3 to 5 hours ago. The problem has been gradually improving. Associated symptoms comments: Numbness and discoloration to rmf.. The symptoms are aggravated by bending.    Past Medical History:  Diagnosis Date  . CAD (coronary artery disease)    a. 11/2005 Cath: nonobs dzs, 30% LAD lesion on cath ;  b. 11/2005 Echo: NL EF;  c. 04/2012 Echo: EF 55-60%, Gr 2 DD;  d. 04/2012 Ex MV: EF 72%, ST depression in recovery with NL perfusion imaging - HTN response to exercise.  . Hepatic cyst    on ct [place]  . HLD (hyperlipidemia)   . HTN (hypertension)   . Midsternal chest pain    cardiology eval with non-ischemic stress test 04/2012    Patient Active Problem List   Diagnosis Date Noted  . Upper airway cough syndrome 04/29/2015  . Essential hypertension 04/13/2015  . Hoarseness 04/13/2015  . Low TSH level 07/30/2014  . Neck discomfort 07/30/2014  . Goiter ? right  07/30/2014  . Trouble swallowing hx of  07/30/2014  . Visit for preventive health examination 07/14/2014  . GERD (gastroesophageal reflux disease) 01/01/2014  . Hypokalemia 12/18/2013  . Shortness of breath 12/18/2013  . Dyspnea 11/25/2013  . Complete tear of right rotator cuff 11/03/2013  . Right shoulder pain 06/30/2013  . Leg pain 05/08/2012  . Chest pressure "fatigue" 05/08/2012  . Sleep disturbance, unspecified 05/08/2012  . Skin cyst 07/16/2011  . Cyst 07/16/2011  . Ear pain 07/02/2011  . HEPATIC CYST 09/12/2009  . ADVERSE REACTION TO MEDICATION 09/12/2009  . BACK PAIN, THORACIC REGION, LEFT 03/28/2009  . CAD, NATIVE VESSEL 12/26/2008  . CHEST PAIN, ATYPICAL 12/02/2008  . NAUSEA AND  VOMITING 02/16/2008  . INTERNAL HEMORRHOIDS 01/09/2008  . EXTERNAL HEMORRHOIDS 01/09/2008  . LIPOMA NOS 09/16/2007  . HYPERLIPIDEMIA 09/16/2007    Past Surgical History:  Procedure Laterality Date  . ABDOMINAL HYSTERECTOMY     fibroid tumors  . BREAST BIOPSY    . child birth x 5    . CHOLECYSTECTOMY    . SHOULDER ARTHROSCOPY WITH ROTATOR CUFF REPAIR AND SUBACROMIAL DECOMPRESSION Right 11/03/2013   Procedure: RIGHT SHOULDER ARTHROSCOPY WITH DEBRIDEMENT, ROTATOR CUFF REPAIR AND SUBACROMIAL DECOMPRESSION, DISTAL CLAVICLE RESECTION;  Surgeon: Mcarthur Rossetti, MD;  Location: [facility];  Service: Orthopedics;  Laterality: Right;  Marland Kitchen VESICOVAGINAL FISTULA CLOSURE W/ TAH     for fibroid tumors    OB History    No data available       Home Medications    Prior to Admission medications   Medication Sig Start Date End Date Taking? Authorizing Provider  amLODipine (NORVASC) 5 MG tablet TAKE 1 TABLET BY MOUTH ONCE DAILY 12/27/16  Yes Burnis Medin, MD  losartan-hydrochlorothiazide (HYZAAR) 100-25 MG tablet TAKE 1 TABLET BY MOUTH ONCE DAILY 12/27/16  Yes Burnis Medin, MD    Family History Family History  Problem Relation Age of Onset  . Breast cancer Sister   . Thyroid disease Sister   . Hypertension Mother     Social History Social History  Substance Use Topics  .  Smoking status: Never Smoker  . Smokeless tobacco: Never Used  . Alcohol use No     Allergies   Lisinopril and Penicillins   Review of Systems Review of Systems  Constitutional: Negative.   Musculoskeletal: Negative for gait problem and joint swelling.  Neurological: Positive for numbness.  All other systems reviewed and are negative.    Physical Exam Triage Vital Signs ED Triage Vitals  Enc Vitals Group     BP 02/04/17 1314 137/92     Pulse Rate 02/04/17 1314 80     Resp --      Temp 02/04/17 1314 98.4 F (36.9 C)     Temp Source 02/04/17 1314 Oral     SpO2 02/04/17 1314 97 %     Weight --       Height --      Head Circumference --      Peak Flow --      Pain Score 02/04/17 1315 0     Pain Loc --      Pain Edu? --      Excl. in [place]? --    No data found.   Updated Vital Signs BP 137/92 (BP Location: Left Arm)   Pulse 80   Temp 98.4 F (36.9 C) (Oral)   SpO2 97%   Visual Acuity Right Eye Distance:   Left Eye Distance:   Bilateral Distance:    Right Eye Near:   Left Eye Near:    Bilateral Near:     Physical Exam  Constitutional: She is oriented to person, place, and time. She appears well-developed and well-nourished. She appears distressed.  Neurological: She is alert and oriented to person, place, and time. A sensory deficit is present. No cranial nerve deficit.  Numbness to prox phalanx of rmf, nl rom, nl stregnth.  Skin: Skin is warm and dry.  Nursing note and vitals reviewed.    UC Treatments / Results  Labs (all labs ordered are listed, but only abnormal results are displayed) Labs Reviewed - No data to display  EKG  EKG Interpretation None       Radiology No results found.  Procedures Procedures (including critical care time)  Medications Ordered in UC Medications - No data to display   Initial Impression / Assessment and Plan / UC Course  I have reviewed the triage vital signs and the nursing notes.  Pertinent labs & imaging results that were available during my care of the patient were reviewed by me and considered in my medical decision making (see chart for details).       Final Clinical Impressions(s) / UC Diagnoses   Final diagnoses:  Tendonitis of finger    New Prescriptions New Prescriptions   No medications on file     Billy Fischer, MD 02/04/17 1404

## 2017-02-04 NOTE — Telephone Encounter (Signed)
Patient Name: Kaylee Adams  DOB: 08/01/1951    Initial Comment Caller states her middle finger on her right hand is numb and turning blue.   Nurse Assessment  Nurse: Leilani Merl, RN, Heather Date/Time (Eastern Time): 02/04/2017 10:32:12 AM  Confirm and document reason for call. If symptomatic, describe symptoms. ---Caller states her middle finger on her right hand is numb and turning blue. She noticed it about 30 min ago.  Does the patient have any new or worsening symptoms? ---Yes  Will a triage be completed? ---Yes  Related visit to physician within the last 2 weeks? ---No  Does the PT have any chronic conditions? (i.e. diabetes, asthma, etc.) ---Yes  List chronic conditions. ---See MR  Is this a behavioral health or substance abuse call? ---No     Guidelines    Guideline Title Affirmed Question Affirmed Notes  Neurologic Deficit Patient sounds very sick or weak to the triager    Final Disposition User   Go to ED Now [place], RN, [facility] - ED   Disagree/Comply: Comply

## 2017-02-04 NOTE — ED Triage Notes (Signed)
Pt states she started feeling numbness in her right middle finger today and she noticed some discoloration to the finger.  Pt denies any injury to the finger.  She called her PCP and they recommended she come here to be evaluated.

## 2017-02-04 NOTE — Telephone Encounter (Signed)
Spoke with pt and advised that she may not want to go to ED as the wait is very long. Advised pt that she can be seen at [facility] UC and likely be seen much faster. Pt agreed and will go there for evaluation. Nothing further needed.

## 2017-02-04 NOTE — Discharge Instructions (Signed)
Tape as needed, see orthopedist if further problems.

## 2017-03-01 ENCOUNTER — Encounter: Payer: Self-pay | Admitting: Gastroenterology

## 2017-03-01 ENCOUNTER — Ambulatory Visit (INDEPENDENT_AMBULATORY_CARE_PROVIDER_SITE_OTHER): Payer: 59 | Admitting: Gastroenterology

## 2017-03-01 VITALS — BP 134/70 | HR 92 | Ht 59.5 in | Wt 170.0 lb

## 2017-03-01 DIAGNOSIS — K838 Other specified diseases of biliary tract: Secondary | ICD-10-CM | POA: Diagnosis not present

## 2017-03-01 DIAGNOSIS — R1011 Right upper quadrant pain: Secondary | ICD-10-CM

## 2017-03-01 NOTE — Progress Notes (Signed)
Review of pertinent gastrointestinal problems: 1. Routine risk for colon cancer: colonoscoyp 12/2007, recall at 10 year interval. 2. Chronic RUQ pains; for at least 10 years, intermittent: Normal LFTs during pain, in 2014 ordered MRCP to check for underlying CBD stone, her insurance company denied coverage. 3. Liver cysts: stable, small based on serial imaging, most recent 12/2016    HPI: This is a   very pleasant 66 year old woman   who was referred to me by Burnis Medin, MD  to evaluate  recurrent right upper quadrant pain .    Chief complaint is  chronic intermittent right upper quadrant pain   I last saw her about 4 or 5 years ago for this same problem.  She has intermittent right upper quadrant pain that seems somewhat colicky. He can last for many days. It is not associated with nausea, vomiting, eating or moving her bowels. She has no associated fever or chills. Her weight has been overall stable. She is not on NSAIDs. She has no GERD symptoms.   Old Data Reviewed:    Ct scan January 2007: There are several hepatic cysts which appear stable since the prior exam as well (2006). The largest of these is in the posterior segment of the right hepatic lobe and measures 1.8 cm in   [place] 11/2008:  Liver: Several scattered hepatic cysts. No intrahepatic biliary duct dilatation.  CT scan 02/2009:  Several small hepatic cysts are again noted. One in the left lobe measuring 40 mm in diameter on image 22 has slightly enlarged compared with the prior study but remain simple in appearance. There are no suspicious liver lesions.  [place] 01/2013: Liver: The liver has a relatively normal echogenic pattern. There are liver cysts present. The cyst in the right lobe is septated measuring 2.7 x 1.4 x 1.5 cm. A cyst in the left lobe measures 1.9 x 1.5 x 1.6 cm.  bloodwork: 01/2013 normal LFTs,  LFTs have been normal for 5 years.  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 Complete review of systems was performed and was otherwise normal.   Past Medical History:  Diagnosis Date  . CAD (coronary artery disease)    a. 11/2005 Cath: nonobs dzs, 30% LAD lesion on cath ;  b. 11/2005 Echo: NL EF;  c. 04/2012 Echo: EF 55-60%, Gr 2 DD;  d. 04/2012 Ex MV: EF 72%, ST depression in recovery with NL perfusion imaging - HTN response to exercise.  . Hepatic cyst    on ct [place]  . HLD (hyperlipidemia)   . HTN (hypertension)   . Midsternal chest pain    cardiology eval with non-ischemic stress test 04/2012    Past Surgical History:  Procedure Laterality Date  . ABDOMINAL HYSTERECTOMY     fibroid tumors  . BREAST BIOPSY    . child birth x 5    . CHOLECYSTECTOMY    . SHOULDER ARTHROSCOPY WITH ROTATOR CUFF REPAIR AND SUBACROMIAL DECOMPRESSION Right 11/03/2013   Procedure: RIGHT SHOULDER ARTHROSCOPY WITH DEBRIDEMENT, ROTATOR CUFF REPAIR AND SUBACROMIAL DECOMPRESSION, DISTAL CLAVICLE RESECTION;  Surgeon: Mcarthur Rossetti, MD;  Location: [place];  Service: Orthopedics;  Laterality: Right;  Marland Kitchen VESICOVAGINAL FISTULA  CLOSURE W/ TAH     for fibroid tumors    Current Outpatient Prescriptions  Medication Sig Dispense Refill  . amLODipine (NORVASC) 5 MG tablet TAKE 1 TABLET BY MOUTH ONCE DAILY 90 tablet 0  . losartan-hydrochlorothiazide (HYZAAR) 100-25 MG tablet TAKE 1 TABLET BY MOUTH ONCE DAILY 90 tablet 0   No current facility-administered medications for this visit.     Allergies as of 03/01/2017 - Review Complete 03/01/2017  Allergen Reaction Noted  . Lisinopril    . Penicillins Nausea Only 09/16/2007    Family History  Problem Relation Age of Onset  . Breast  cancer Sister   . Thyroid disease Sister   . Hypertension Mother     Social History   Social History  . Marital status: Married    Spouse name: N/A  . Number of children: 5  . Years of education: N/A   Occupational History  .  Behavioral Health   Social History Main Topics  . Smoking status: Never Smoker  . Smokeless tobacco: Never Used  . Alcohol use No  . Drug use: No  . Sexual activity: Not on file   Other Topics Concern  . Not on file   Social History Narrative   Married, full time Network engineer. 3 pets    [place]   6-8 hours sleep      Physical Exam: BP 134/70 (BP Location: Left Arm, Patient Position: Sitting, Cuff Size: Normal)   Pulse 92   Ht 4' 11.5" (1.511 m) Comment: height measured without shoes  Wt 170 lb (77.1 kg)   BMI 33.76 kg/m  Constitutional: generally well-appearing Psychiatric: alert and oriented x3 Eyes: extraocular movements intact Mouth: oral pharynx moist, no lesions Neck: supple no lymphadenopathy Cardiovascular: heart regular rate and rhythm Lungs: clear to auscultation bilaterally Abdomen: soft, nontender, nondistended, no obvious ascites, no peritoneal signs, normal bowel sounds Extremities: no lower extremity edema bilaterally Skin: no lesions on visible extremities   Assessment and plan: 66 y.o. female with  chronic intermittent right upper quadrant pain her bile duct on ultrasound recently was 8.7 mm. This is probably just a physiologic reaction to having her gallbladder removed. It is certainly possible that she has retained bile duct stones. Her symptoms are consistent with that. Normally however I would expect her liver tests to be elevated during pain episodes and those have not been. I recommended that we should try to determine whether she has retained bile duct stone. Best noninvasive test his MRI with MRCP and we will try to order that for her again. For 5 years ago her insurance company declined to cover it. If again we  have run into difficulties then I would recommend a somewhat more invasive method which would be upper endoscopy with endoscopic ultrasound.    Please see the "Patient Instructions" section for addition details about the plan.   Owens Loffler, MD [facility] 03/01/2017, 2:55 PM  Cc: Burnis Medin, MD

## 2017-03-01 NOTE — Patient Instructions (Addendum)
MRI with MRCP to check for retained CBD stones.  You have been scheduled for an MRI at [facility] on 03/14/17 . Your appointment time is 10 am . Please arrive 15 minutes prior to your appointment time for registration purposes. Please make certain not to have anything to eat or drink 6 hours prior to your test. In addition, if you have any metal in your body, have a pacemaker or defibrillator, please be sure to let your ordering physician know. This test typically takes 45 minutes to 1 hour to complete.  We will call you with an appointment, the phone lines are down.  Call [phone] if you do not hear form our office next week.  Ask for Chong Sicilian, RN

## 2017-03-04 NOTE — Addendum Note (Signed)
Addended by: Barron Alvine on: 03/04/2017 08:51 AM   Modules accepted: Orders

## 2017-03-08 ENCOUNTER — Other Ambulatory Visit (INDEPENDENT_AMBULATORY_CARE_PROVIDER_SITE_OTHER): Payer: 59

## 2017-03-08 DIAGNOSIS — K838 Other specified diseases of biliary tract: Secondary | ICD-10-CM | POA: Diagnosis not present

## 2017-03-08 DIAGNOSIS — R1011 Right upper quadrant pain: Secondary | ICD-10-CM

## 2017-03-08 LAB — CREATININE, SERUM: CREATININE: 0.76 mg/dL (ref 0.40–1.20)

## 2017-03-08 LAB — BUN: BUN: 14 mg/dL (ref 6–23)

## 2017-03-14 ENCOUNTER — Other Ambulatory Visit: Payer: Self-pay | Admitting: Gastroenterology

## 2017-03-14 ENCOUNTER — Ambulatory Visit (HOSPITAL_COMMUNITY)
Admission: RE | Admit: 2017-03-14 | Discharge: 2017-03-14 | Disposition: A | Discharge status: Home / Self Care | Payer: 59 | Source: Ambulatory Visit | Attending: Gastroenterology | Admitting: Gastroenterology

## 2017-03-14 DIAGNOSIS — Z9049 Acquired absence of other specified parts of digestive tract: Secondary | ICD-10-CM | POA: Diagnosis not present

## 2017-03-14 DIAGNOSIS — R1011 Right upper quadrant pain: Secondary | ICD-10-CM | POA: Insufficient documentation

## 2017-03-14 DIAGNOSIS — K7689 Other specified diseases of liver: Secondary | ICD-10-CM | POA: Insufficient documentation

## 2017-03-14 DIAGNOSIS — K838 Other specified diseases of biliary tract: Secondary | ICD-10-CM

## 2017-03-14 MED ORDER — GADOBENATE DIMEGLUMINE 529 MG/ML IV SOLN
20.0000 mL | Freq: Once | INTRAVENOUS | Status: AC | PRN
  Administered 2017-03-14: 16 mL via INTRAVENOUS

## 2017-03-18 ENCOUNTER — Telehealth: Payer: Self-pay | Admitting: Gastroenterology

## 2017-03-18 NOTE — Telephone Encounter (Signed)
See results notes for additional details. 

## 2017-03-18 NOTE — Telephone Encounter (Signed)
Pt returning Sheri's call about MRI results from 03/14/17.

## 2017-04-02 ENCOUNTER — Other Ambulatory Visit: Payer: Self-pay | Admitting: Internal Medicine

## 2017-04-02 MED FILL — AMLODIPINE BESYLATE 5 MG TA: 5 | 90 days supply | Qty: 90 | Fill #0

## 2017-04-02 MED FILL — LOSARTAN-HCTZ 100-25 MG TAB: 100-25 | 90 days supply | Qty: 90 | Fill #0

## 2017-05-27 ENCOUNTER — Encounter: Payer: Self-pay | Admitting: Family Medicine

## 2017-05-27 ENCOUNTER — Ambulatory Visit (INDEPENDENT_AMBULATORY_CARE_PROVIDER_SITE_OTHER): Payer: 59 | Admitting: Family Medicine

## 2017-05-27 VITALS — BP 138/68 | HR 72 | Resp 12 | Ht 59.5 in | Wt 169.4 lb

## 2017-05-27 DIAGNOSIS — M545 Low back pain, unspecified: Secondary | ICD-10-CM

## 2017-05-27 MED ORDER — METHOCARBAMOL 500 MG PO TABS: 500.0000 mg | ORAL_TABLET | Freq: Three times a day (TID) | ORAL | 0 refills | Status: AC | PRN

## 2017-05-27 MED FILL — METHOCARBAMOL 500 MG TABLET: 500 | 15 days supply | Qty: 45 | Fill #0

## 2017-05-27 NOTE — Progress Notes (Signed)
HPI:   ACUTE VISIT:  Chief Complaint  Patient presents with  . Back Pain    Kaylee Adams is a 66 y.o. female, who is here today complaining of lower back pain . Woke up with pain today.  She cannot not walk with back straight because pain.  She denies any recent injury but started moderate intensity exercise routing at home about 2 weeks ago , which involved jumping jacks,running, and stretching among some.   Exacerbated by standing up after prolonged sitting, getting up in the morning, and prolonged standing/walking. Alleviated by rest and by "crouching" back over. No rash or edema on area, fever, chills, or abnormal wt loss.    Back Pain  This is a new problem. The current episode started today. The problem occurs constantly. The problem is unchanged. The pain is present in the lumbar spine. The quality of the pain is described as aching. The pain does not radiate. The pain is at a severity of 8/10. The pain is moderate. The pain is the same all the time. The symptoms are aggravated by standing. Stiffness is present in the morning. Pertinent negatives include no abdominal pain, bladder incontinence, bowel incontinence, chest pain, dysuria, fever, headaches, leg pain, numbness, pelvic pain, perianal numbness, tingling, weakness or weight loss. Risk factors include obesity. She has tried NSAIDs for the symptoms. The treatment provided mild relief.    Prior Hx of back pain: Denies. Has Hx of shoulder pain.  She has taken Ibuprofen 600 mg, last dose today.  Review of Systems  Constitutional: Negative for appetite change, fatigue, fever, unexpected weight change and weight loss.  HENT: Negative for mouth sores and sore throat.   Respiratory: Negative for shortness of breath and wheezing.   Cardiovascular: Negative for chest pain and leg swelling.  Gastrointestinal: Negative for abdominal pain, blood in stool, bowel incontinence, nausea and vomiting.       Negative for  changes in bowel habits or fecal incontinence.  Genitourinary: Negative for bladder incontinence, decreased urine volume, dysuria, hematuria and pelvic pain.       Negative for urine incontinence.  Musculoskeletal: Positive for arthralgias and back pain. Negative for gait problem.  Skin: Negative for rash.  Neurological: Negative for tingling, weakness, numbness and headaches.  Psychiatric/Behavioral: Negative for confusion. The patient is nervous/anxious.      Current Outpatient Prescriptions on File Prior to Visit  Medication Sig Dispense Refill  . amLODipine (NORVASC) 5 MG tablet TAKE 1 TABLET BY MOUTH ONCE DAILY 90 tablet 0  . losartan-hydrochlorothiazide (HYZAAR) 100-25 MG tablet TAKE 1 TABLET BY MOUTH ONCE DAILY 90 tablet 0   No current facility-administered medications on file prior to visit.      Past Medical History:  Diagnosis Date  . CAD (coronary artery disease)    a. 11/2005 Cath: nonobs dzs, 30% LAD lesion on cath ;  b. 11/2005 Echo: NL EF;  c. 04/2012 Echo: EF 55-60%, Gr 2 DD;  d. 04/2012 Ex MV: EF 72%, ST depression in recovery with NL perfusion imaging - HTN response to exercise.  . Hepatic cyst    on ct [place]  . HLD (hyperlipidemia)   . HTN (hypertension)   . Midsternal chest pain    cardiology eval with non-ischemic stress test 04/2012   Allergies  Allergen Reactions  . Lisinopril     REACTION: cough  . Penicillins Nausea Only    Social History   Social History  . Marital status: Married  Spouse name: N/A  . Number of children: 5  . Years of education: N/A   Occupational History  .  Behavioral Health   Social History Main Topics  . Smoking status: Never Smoker  . Smokeless tobacco: Never Used  . Alcohol use No  . Drug use: No  . Sexual activity: Not Asked   Other Topics Concern  . None   Social History Narrative   Married, full time Network engineer. 3 pets    [place]   6-8 hours sleep     Vitals:   05/27/17 1427  BP: 138/68  Pulse: 72    Resp: 12  O2 sat at RA 97% Body mass index is 33.64 kg/m.   Physical Exam  Nursing note and vitals reviewed. Constitutional: She is oriented to person, place, and time. She appears well-developed. She does not appear ill. No distress.  HENT:  Head: Atraumatic.  Eyes: Conjunctivae and EOM are normal.  Cardiovascular:  Pulses:      Dorsalis pedis pulses are 2+ on the right side, and 2+ on the left side.  Respiratory: Effort normal and breath sounds normal. No respiratory distress.  GI: Soft. She exhibits no mass. There is no hepatomegaly. There is no tenderness.  Musculoskeletal: She exhibits no edema.       Lumbar back: She exhibits decreased range of motion. She exhibits no tenderness and no bony tenderness.  Mild scoliosis. Pain with movement on exam table.  Antalgic gait.  Neurological: She is alert and oriented to person, place, and time. She has normal strength.  Reflex Scores:      Patellar reflexes are 2+ on the right side and 2+ on the left side. SLR negative bilateral. She can walk on tip toes and heels.  Skin: Skin is warm. No rash noted. No erythema.  Psychiatric: Her mood appears anxious.  Well groomed, good eye contact.     ASSESSMENT AND PLAN:   [name] was seen today for back pain.  Diagnoses and all orders for this visit:  Acute bilateral low back pain without sciatica -     methocarbamol (ROBAXIN) 500 MG tablet; Take 1 tablet (500 mg total) by mouth every 8 (eight) hours as needed for muscle spasms.   Since no Hx of trauma and no bony tenderness on examination I do not think imaging is needed at this time. Caution with NSAID's because Hx of HTN, monitoring BP recommended. Local heat, relative rest, and avoidance of intense exercise activity in the future; choose low impact exercise instead.  Some side effects of muscle relaxants, recommend it for short period of time. F/U with PCP if needed in 4-6 weeks Instructed about warning signs.   -Ms.[name] advised to seek immediate medical attention is symptoms suddenly get worse or to follow if symptoms persist or new concerns arise.       Jorey Dollard G. [place], MD  [facility]. [place] office.

## 2017-05-27 NOTE — Patient Instructions (Signed)
Kaylee Adams I have seen you today for an acute visit.  A few things to remember from today's visit:   Acute bilateral low back pain without sciatica - Plan: methocarbamol (ROBAXIN) 500 MG tablet   Medications prescribed today are intended for short period of time and will not be refill upon request, a follow up appointment might be necessary to discuss continuation of of treatment if appropriate.    Back pain is very common in adults.The cause of back pain is rarely dangerous and the pain often gets better over time even with no pharmacologic treatment.  The cause of your back pain may not be known. Some common causes of back pain include: 1. Strain of the muscles or ligaments supporting the spine. 2. Wear and tear (degeneration) of the spinal disks. 3. Arthritis. 4. Direct injury to the back.  For many people, back pain may return. Since back pain is rarely dangerous, most people can learn to manage this condition on their own.  HOME CARE INSTRUCTIONS Watch your back pain for any changes. The following actions may help to lessen any discomfort you are feeling:  1. Remain active. It is stressful on your back to sit or stand in one place for long periods of time. Do not sit, drive, or stand in one place for more than 30 minutes at a time. Take short walks on even surfaces as soon as you are able.Try to increase the length of time you walk each day.  2. Exercise regularly as directed by your health care provider. Exercise helps your back heal faster. It also helps avoid future injury by keeping your muscles strong and flexible.  3. Do not stay in bed.Resting more than 1-2 days can delay your recovery.                                                      4. Pay attention to your body when you bend and lift. The most comfortable positions are those that put less stress on your recovering back.  5.  Always use proper lifting techniques, including: Bending your knees. Keeping  the load close to your body. Avoiding twisting.  6. Find a comfortable position to sleep. Use a firm mattress and lie on your side with your knees slightly bent. If you lie on your back, put a pillow under your knees.  7. Over the counter rubbing medications like Icy Hot or Asper cream with Lidocaine may help without significant side effects.  Acetaminophen and/or Aleve/Ibuprofen can be taken if needed and if not contraindications. Local ice and heat may be alternated to reduce pain and spasms. Also massage and even chiropractor treatment.      Muscle relaxants might or might not help, they cause drowsiness among other    side effects. They could also interact with some of medications you may be already taking (medications for depression/anxiety and some pain medications).   8. Maintain a healthy weight. Excess weight puts extra stress on your back and makes it difficult to maintain good posture.   SEEK MEDICAL CARE IF: worsening pain, associated fever, rash/edema on area, pain going to legs or buttocks, numbness/tingling, night pain, or abnormal weight loss.    SEEK IMMEDIATE MEDICAL CARE IF:  1. You develop new bowel or bladder control problems. 2. You have unusual weakness  or numbness in your arms or legs. 3. You develop nausea or vomiting. 4. You develop abdominal pain. 5. You feel faint.     Back Exercises The following exercises strengthen the muscles that help to support the back. They also help to keep the lower back flexible. Doing these exercises can help to prevent back pain or lessen existing pain. If you have back pain or discomfort, try doing these exercises 2-3 times each day or as told by your health care provider. When the pain goes away, do them once each day, but increase the number of times that you repeat the steps for each exercise (do more repetitions). If you do not have back pain or discomfort, do these exercises once each day or as told by your health care  provider.   EXERCISES Single Knee to Chest Repeat these steps 3-5 times for each leg: 5. Lie on your back on a firm bed or the floor with your legs extended. 6. Bring one knee to your chest. Your other leg should stay extended and in contact with the floor. 7. Hold your knee in place by grabbing your knee or thigh. 8. Pull on your knee until you feel a gentle stretch in your lower back. 9. Hold the stretch for 10-30 seconds. 10. Slowly release and straighten your leg.  Pelvic Tilt Repeat these steps 5-10 times: 2. Lie on your back on a firm bed or the floor with your legs extended. 3. Bend your knees so they are pointing toward the ceiling and your feet are flat on the floor. 4. Tighten your lower abdominal muscles to press your lower back against the floor. This motion will tilt your pelvis so your tailbone points up toward the ceiling instead of pointing to your feet or the floor. 5. With gentle tension and even breathing, hold this position for 5-10 seconds.  Cat-Cow Repeat these steps until your lower back becomes more flexible: 1. Get into a hands-and-knees position on a firm surface. Keep your hands under your shoulders, and keep your knees under your hips. You may place padding under your knees for comfort. 2. Let your head hang down, and point your tailbone toward the floor so your lower back becomes rounded like the back of a cat. 3. Hold this position for 5 seconds. 4. Slowly lift your head and point your tailbone up toward the ceiling so your back forms a sagging arch like the back of a cow. 5. Hold this position for 5 seconds.   Press-Ups Repeat these steps 5-10 times: 6. Lie on your abdomen (face-down) on the floor. 7. Place your palms near your head, about shoulder-width apart. 8. While you keep your back as relaxed as possible and keep your hips on the floor, slowly straighten your arms to raise the top half of your body and lift your shoulders. Do not use your back  muscles to raise your upper torso. You may adjust the placement of your hands to make yourself more comfortable. 9. Hold this position for 5 seconds while you keep your back relaxed. 10. Slowly return to lying flat on the floor.   Bridges Repeat these steps 10 times: 1. Lie on your back on a firm surface. 2. Bend your knees so they are pointing toward the ceiling and your feet are flat on the floor. 3. Tighten your buttocks muscles and lift your buttocks off of the floor until your waist is at almost the same height as your knees. You should feel  the muscles working in your buttocks and the back of your thighs. If you do not feel these muscles, slide your feet 1-2 inches farther away from your buttocks. 4. Hold this position for 3-5 seconds. 5. Slowly lower your hips to the starting position, and allow your buttocks muscles to relax completely. If this exercise is too easy, try doing it with your arms crossed over your chest.      In general please monitor for signs of worsening symptoms and seek immediate medical attention if any concerning.

## 2017-06-07 ENCOUNTER — Telehealth: Payer: Self-pay | Admitting: Internal Medicine

## 2017-06-07 ENCOUNTER — Other Ambulatory Visit: Payer: Self-pay | Admitting: Emergency Medicine

## 2017-06-07 DIAGNOSIS — G8929 Other chronic pain: Secondary | ICD-10-CM

## 2017-06-07 DIAGNOSIS — M25519 Pain in unspecified shoulder: Principal | ICD-10-CM

## 2017-06-07 DIAGNOSIS — M79603 Pain in arm, unspecified: Secondary | ICD-10-CM

## 2017-06-07 NOTE — Telephone Encounter (Signed)
Pt states she is having constant shoulder and arm pain for several months now.  Would like to know if you will refer her to a specialist. Ok to leave message.

## 2017-06-07 NOTE — Telephone Encounter (Signed)
Please advise 

## 2017-06-07 NOTE — Telephone Encounter (Signed)
Please place a referral to ortho  For arm and shoulder pain  And inform patient of plan

## 2017-06-07 NOTE — Telephone Encounter (Signed)
Spoke with patient about referral being placed and someone should be contacting her to set up an appointment

## 2017-06-20 ENCOUNTER — Ambulatory Visit (INDEPENDENT_AMBULATORY_CARE_PROVIDER_SITE_OTHER): Payer: Self-pay | Admitting: Orthopedic Surgery

## 2017-06-24 ENCOUNTER — Ambulatory Visit (INDEPENDENT_AMBULATORY_CARE_PROVIDER_SITE_OTHER): Payer: 59 | Admitting: Orthopaedic Surgery

## 2017-06-24 ENCOUNTER — Ambulatory Visit (INDEPENDENT_AMBULATORY_CARE_PROVIDER_SITE_OTHER): Payer: 59

## 2017-06-24 DIAGNOSIS — G8929 Other chronic pain: Secondary | ICD-10-CM | POA: Diagnosis not present

## 2017-06-24 DIAGNOSIS — M25512 Pain in left shoulder: Secondary | ICD-10-CM | POA: Diagnosis not present

## 2017-06-24 NOTE — Progress Notes (Signed)
Office Visit Note   Patient: Kaylee Adams           Date of Birth: 1951/01/10           MRN: [id number] Visit Date: 06/24/2017              Requested by: Burnis Medin, MD [place], [place] [address] PCP: Burnis Medin, MD   Assessment & Plan: Visit Diagnoses:  1. Chronic left shoulder pain     Plan: I spoke with her about shoulder impingement syndrome and the treatment for this. She is already working activity modification just recently. Outlet there is things at work where she is reaching out to far for incision she's stopped doing that that helped some with her shoulder. She does not way type of injection. She is going to try 600 mg of ibuprofen 2-3 times a day with meals release the next week or 2. QUESTIONS were encouraged and answered. She understands this this Bowsher enough on except be a subacromial steroid injection.  Follow-Up Instructions: Return if symptoms worsen or fail to improve.   Orders:  Orders Placed This Encounter  Procedures  . XR Shoulder Left   No orders of the defined types were placed in this encounter.     Procedures: No procedures performed   Clinical Data: No additional findings.   Subjective: No chief complaint on file. The patient is very pleasant 66 year old who comes in with chief complaint of left shoulder pain. Is been hurting for about 2-3 years now and surgeries been off and on. She systems and she can't sleep due to pain in her shoulder. She's taken just 2 ibuprofen at a time when she does take some but nothing on a regular basis. Recently reaching to get stuff at work is been difficult for her but she figured out that repetitive activities was hurting her shoulder since she is moving things closer her desk and that is helped quite a bit. She denies any weakness in the shoulder. She denies any neck pain or numbness and tingling. Reaching behind and overhead as well as most of the pain. It does wake her up again at  night likely said.  HPI  Review of Systems He currently denies any headache, chest pain, shortness of breath, fever, chills, nausea, vomiting.  Objective: Vital Signs: There were no vitals taken for this visit.  Physical Exam She is alert or 3 in no acute distress Ortho Exam Examination of the left shoulder shows full range of motion. The shoulder was very well located. She does have positive Neer and Hawkins signs. She has 5 out of 5 strength of rotator cuff. Her liftoff is negative. Specialty Comments:  No specialty comments available.  Imaging: Xr Shoulder Left  Result Date: 06/24/2017 3 views left shoulder show well located shoulder with no acute findings.    PMFS History: Patient Active Problem List   Diagnosis Date Noted  . Upper airway cough syndrome 04/29/2015  . Essential hypertension 04/13/2015  . Hoarseness 04/13/2015  . Low TSH level 07/30/2014  . Neck discomfort 07/30/2014  . Goiter ? right  07/30/2014  . Trouble swallowing hx of  07/30/2014  . Visit for preventive health examination 07/14/2014  . GERD (gastroesophageal reflux disease) 01/01/2014  . Hypokalemia 12/18/2013  . Shortness of breath 12/18/2013  . Dyspnea 11/25/2013  . Complete tear of right rotator cuff 11/03/2013  . Right shoulder pain 06/30/2013  . Leg pain 05/08/2012  . Chest pressure "  fatigue" 05/08/2012  . Sleep disturbance, unspecified 05/08/2012  . Skin cyst 07/16/2011  . Cyst 07/16/2011  . Ear pain 07/02/2011  . HEPATIC CYST 09/12/2009  . ADVERSE REACTION TO MEDICATION 09/12/2009  . BACK PAIN, THORACIC REGION, LEFT 03/28/2009  . CAD, NATIVE VESSEL 12/26/2008  . CHEST PAIN, ATYPICAL 12/02/2008  . NAUSEA AND VOMITING 02/16/2008  . INTERNAL HEMORRHOIDS 01/09/2008  . EXTERNAL HEMORRHOIDS 01/09/2008  . LIPOMA NOS 09/16/2007  . HYPERLIPIDEMIA 09/16/2007   Past Medical History:  Diagnosis Date  . CAD (coronary artery disease)    a. 11/2005 Cath: nonobs dzs, 30% LAD lesion on cath  ;  b. 11/2005 Echo: NL EF;  c. 04/2012 Echo: EF 55-60%, Gr 2 DD;  d. 04/2012 Ex MV: EF 72%, ST depression in recovery with NL perfusion imaging - HTN response to exercise.  . Hepatic cyst    on ct [place]  . HLD (hyperlipidemia)   . HTN (hypertension)   . Midsternal chest pain    cardiology eval with non-ischemic stress test 04/2012    Family History  Problem Relation Age of Onset  . Breast cancer Sister   . Thyroid disease Sister   . Hypertension Mother     Past Surgical History:  Procedure Laterality Date  . ABDOMINAL HYSTERECTOMY     fibroid tumors  . BREAST BIOPSY    . child birth x 5    . CHOLECYSTECTOMY    . SHOULDER ARTHROSCOPY WITH ROTATOR CUFF REPAIR AND SUBACROMIAL DECOMPRESSION Right 11/03/2013   Procedure: RIGHT SHOULDER ARTHROSCOPY WITH DEBRIDEMENT, ROTATOR CUFF REPAIR AND SUBACROMIAL DECOMPRESSION, DISTAL CLAVICLE RESECTION;  Surgeon: Mcarthur Rossetti, MD;  Location: [facility];  Service: Orthopedics;  Laterality: Right;  Marland Kitchen VESICOVAGINAL FISTULA CLOSURE W/ TAH     for fibroid tumors   Social History   Occupational History  .  Behavioral Health   Social History Main Topics  . Smoking status: Never Smoker  . Smokeless tobacco: Never Used  . Alcohol use No  . Drug use: No  . Sexual activity: Not on file

## 2017-07-03 ENCOUNTER — Other Ambulatory Visit: Payer: Self-pay | Admitting: Emergency Medicine

## 2017-07-03 ENCOUNTER — Other Ambulatory Visit: Payer: Self-pay | Admitting: Internal Medicine

## 2017-07-03 DIAGNOSIS — I10 Essential (primary) hypertension: Secondary | ICD-10-CM

## 2017-07-03 DIAGNOSIS — E785 Hyperlipidemia, unspecified: Secondary | ICD-10-CM

## 2017-07-03 MED FILL — AMLODIPINE BESYLATE 5 MG TA: 5 | 90 days supply | Qty: 90 | Fill #0

## 2017-07-03 MED FILL — LOSARTAN-HCTZ 100-25 MG TAB: 100-25 | 90 days supply | Qty: 90 | Fill #0

## 2017-07-03 NOTE — Telephone Encounter (Signed)
Spoke with patient regarding scheduling a lab appointment per Dr. Regis Bill last OV notes. Patient states that she will call back and schedule lab appointment. Labs have been ordered as 07/03/2017

## 2017-07-05 ENCOUNTER — Other Ambulatory Visit (INDEPENDENT_AMBULATORY_CARE_PROVIDER_SITE_OTHER): Payer: 59

## 2017-07-05 DIAGNOSIS — E785 Hyperlipidemia, unspecified: Secondary | ICD-10-CM

## 2017-07-05 DIAGNOSIS — I10 Essential (primary) hypertension: Secondary | ICD-10-CM

## 2017-07-05 LAB — CBC WITH DIFFERENTIAL/PLATELET
Basophils Absolute: 0 10*3/uL (ref 0.0–0.1)
Basophils Relative: 0.1 % (ref 0.0–3.0)
EOS PCT: 0 % (ref 0.0–5.0)
Eosinophils Absolute: 0 10*3/uL (ref 0.0–0.7)
HCT: 39 % (ref 36.0–46.0)
Hemoglobin: 13 g/dL (ref 12.0–15.0)
LYMPHS ABS: 2.3 10*3/uL (ref 0.7–4.0)
Lymphocytes Relative: 40.9 % (ref 12.0–46.0)
MCHC: 33.3 g/dL (ref 30.0–36.0)
MCV: 79.1 fl (ref 78.0–100.0)
MONO ABS: 0.4 10*3/uL (ref 0.1–1.0)
MONOS PCT: 6.9 % (ref 3.0–12.0)
NEUTROS ABS: 2.9 10*3/uL (ref 1.4–7.7)
NEUTROS PCT: 52.1 % (ref 43.0–77.0)
PLATELETS: 287 10*3/uL (ref 150.0–400.0)
RBC: 4.93 Mil/uL (ref 3.87–5.11)
RDW: 13.5 % (ref 11.5–15.5)
WBC: 5.6 10*3/uL (ref 4.0–10.5)

## 2017-07-05 LAB — LIPID PANEL
CHOLESTEROL: 248 mg/dL — AB (ref 0–200)
HDL: 54.2 mg/dL (ref >39.00)
LDL Cholesterol: 170 mg/dL — ABNORMAL HIGH (ref 0–99)
NonHDL: 193.78
TRIGLYCERIDES: 118 mg/dL (ref 0.0–149.0)
Total CHOL/HDL Ratio: 5
VLDL: 23.6 mg/dL (ref 0.0–40.0)

## 2017-07-16 ENCOUNTER — Telehealth: Payer: Self-pay | Admitting: Emergency Medicine

## 2017-07-16 ENCOUNTER — Other Ambulatory Visit: Payer: Self-pay | Admitting: Emergency Medicine

## 2017-07-16 DIAGNOSIS — E785 Hyperlipidemia, unspecified: Secondary | ICD-10-CM

## 2017-07-16 MED ORDER — ATORVASTATIN CALCIUM 20 MG PO TABS: 20.0000 mg | ORAL_TABLET | Freq: Every day | ORAL | 3 refills | Status: DC

## 2017-07-16 MED FILL — ATORVASTATIN 20 MG TABLET: 20 | 30 days supply | Qty: 30 | Fill #0

## 2017-07-16 NOTE — Telephone Encounter (Signed)
Patient would like to know if she could have her thyroid check also. Please advise.

## 2017-07-16 NOTE — Telephone Encounter (Signed)
Ok to do tsh  And free t4

## 2017-07-17 ENCOUNTER — Other Ambulatory Visit: Payer: Self-pay | Admitting: Emergency Medicine

## 2017-07-17 DIAGNOSIS — E785 Hyperlipidemia, unspecified: Secondary | ICD-10-CM

## 2017-07-17 NOTE — Telephone Encounter (Signed)
Orders have been placed.

## 2017-09-11 ENCOUNTER — Ambulatory Visit (INDEPENDENT_AMBULATORY_CARE_PROVIDER_SITE_OTHER): Payer: 59 | Admitting: Adult Health

## 2017-09-11 VITALS — BP 156/72 | Temp 98.6°F | Wt 164.0 lb

## 2017-09-11 DIAGNOSIS — M5432 Sciatica, left side: Secondary | ICD-10-CM | POA: Diagnosis not present

## 2017-09-11 MED ORDER — CYCLOBENZAPRINE HCL 10 MG PO TABS: 10.0000 mg | ORAL_TABLET | Freq: Every day | ORAL | 0 refills | Status: DC

## 2017-09-11 MED ORDER — METHYLPREDNISOLONE 4 MG PO TBPK: ORAL_TABLET | ORAL | 0 refills | Status: DC

## 2017-09-11 MED FILL — METHYLPREDNISOLONE 4 MG TAB: 4 | 6 days supply | Qty: 21 | Fill #0

## 2017-09-11 MED FILL — CYCLOBENZAPRINE 10 MG TAB: 10 | 15 days supply | Qty: 15 | Fill #0

## 2017-09-11 NOTE — Progress Notes (Addendum)
Subjective:    Patient ID: Kaylee Adams, female    DOB: 1951-07-20, 66 y.o.   MRN: [id number]  HPI  66 year old female who  has a past medical history of CAD (coronary artery disease); Hepatic cyst; HLD (hyperlipidemia); HTN (hypertension); and Midsternal chest pain. She is a patient of Dr Regis Bill who I am seeing today for an acute issue. She reports that last week she noticed left thigh pain and as the week progressed her pain became worse. The pain dis described as " burning". Pain is located down the outside of her upper left leg.   Pain is worse with walking   She denies any trauma or falls.   Review of Systems See HPI   Past Medical History:  Diagnosis Date  . CAD (coronary artery disease)    a. 11/2005 Cath: nonobs dzs, 30% LAD lesion on cath ;  b. 11/2005 Echo: NL EF;  c. 04/2012 Echo: EF 55-60%, Gr 2 DD;  d. 04/2012 Ex MV: EF 72%, ST depression in recovery with NL perfusion imaging - HTN response to exercise.  . Hepatic cyst    on ct [place]  . HLD (hyperlipidemia)   . HTN (hypertension)   . Midsternal chest pain    cardiology eval with non-ischemic stress test 04/2012    Social History   Social History  . Marital status: Married    Spouse name: N/A  . Number of children: 5  . Years of education: N/A   Occupational History  .  Behavioral Health   Social History Main Topics  . Smoking status: Never Smoker  . Smokeless tobacco: Never Used  . Alcohol use No  . Drug use: No  . Sexual activity: Not on file   Other Topics Concern  . Not on file   Social History Narrative   Married, full time Network engineer. 3 pets    [place]   6-8 hours sleep     Past Surgical History:  Procedure Laterality Date  . ABDOMINAL HYSTERECTOMY     fibroid tumors  . BREAST BIOPSY    . child birth x 5    . CHOLECYSTECTOMY    . SHOULDER ARTHROSCOPY WITH ROTATOR CUFF REPAIR AND SUBACROMIAL DECOMPRESSION Right 11/03/2013   Procedure: RIGHT SHOULDER ARTHROSCOPY WITH DEBRIDEMENT, ROTATOR  CUFF REPAIR AND SUBACROMIAL DECOMPRESSION, DISTAL CLAVICLE RESECTION;  Surgeon: Mcarthur Rossetti, MD;  Location: [facility];  Service: Orthopedics;  Laterality: Right;  Marland Kitchen VESICOVAGINAL FISTULA CLOSURE W/ TAH     for fibroid tumors    Family History  Problem Relation Age of Onset  . Breast cancer Sister   . Thyroid disease Sister   . Hypertension Mother     Allergies  Allergen Reactions  . Lisinopril     REACTION: cough  . Penicillins Nausea Only    Current Outpatient Prescriptions on File Prior to Visit  Medication Sig Dispense Refill  . amLODipine (NORVASC) 5 MG tablet TAKE 1 TABLET BY MOUTH ONCE DAILY 90 tablet 0  . losartan-hydrochlorothiazide (HYZAAR) 100-25 MG tablet TAKE 1 TABLET BY MOUTH ONCE DAILY 90 tablet 0   No current facility-administered medications on file prior to visit.     BP (!) 156/72 (BP Location: Left Arm)   Temp 98.6 F (37 C) (Oral)   Wt 164 lb (74.4 kg)   BMI 32.57 kg/m       Objective:   Physical Exam  Constitutional: She is oriented to person, place, and time. She appears well-developed and  well-nourished. No distress.  Cardiovascular: Normal rate, regular rhythm, normal heart sounds and intact distal pulses.  Exam reveals no gallop and no friction rub.   No murmur heard. Pulmonary/Chest: Effort normal and breath sounds normal. No respiratory distress. She has no wheezes. She has no rales. She exhibits no tenderness.  Musculoskeletal: She exhibits tenderness. She exhibits no edema.  Pain radiates down lateral left leg. No pain with palpation, knee to chest. She does have slight discomfort with internal and external rotation.   Walks with limp  No calf tenderness   Neurological: She is alert and oriented to person, place, and time. She has normal reflexes.  Skin: Skin is warm and dry. No rash noted. She is not diaphoretic. No erythema. No pallor.  Psychiatric: She has a normal mood and affect. Her behavior is normal. Judgment and thought  content normal.  Nursing note and vitals reviewed.     Assessment & Plan:  1. Sciatica of left side - methylPREDNISolone (MEDROL DOSEPAK) 4 MG TBPK tablet; Take as directed  Dispense: 21 tablet; Refill: 0 - cyclobenzaprine (FLEXERIL) 10 MG tablet; Take 1 tablet (10 mg total) by mouth at bedtime.  Dispense: 15 tablet; Refill: 0 - Motrin 600 mg Q8H - Stretching exercises  - Follow up if no improvement in the next 2-3 days or sooner if symptoms develop   Dorothyann Peng, NP

## 2017-09-11 NOTE — Patient Instructions (Addendum)
It was great meeting you today   Your exam is consistent with sciatica.   I have prescribed a prednisone pack and muscle relaxer called Flexeril. Take these medications as directed  Also take Motrin 600 mg and do stretching exercises   Follow up as needed    Sciatica Sciatica is pain, numbness, weakness, or tingling along your sciatic nerve. The sciatic nerve starts in the lower back and goes down the back of each leg. Sciatica happens when this nerve is pinched or has pressure put on it. Sciatica usually goes away on its own or with treatment. Sometimes, sciatica may keep coming back (recur). Follow these instructions at home: Medicines  Take over-the-counter and prescription medicines only as told by your doctor.  Do not drive or use heavy machinery while taking prescription pain medicine. Managing pain  If directed, put ice on the affected area. ? Put ice in a plastic bag. ? Place a towel between your skin and the bag. ? Leave the ice on for 20 minutes, 2-3 times a day.  After icing, apply heat to the affected area before you exercise or as often as told by your doctor. Use the heat source that your doctor tells you to use, such as a moist heat pack or a heating pad. ? Place a towel between your skin and the heat source. ? Leave the heat on for 20-30 minutes. ? Remove the heat if your skin turns bright red. This is especially important if you are unable to feel pain, heat, or cold. You may have a greater risk of getting burned. Activity  Return to your normal activities as told by your doctor. Ask your doctor what activities are safe for you. ? Avoid activities that make your sciatica worse.  Take short rests during the day. Rest in a lying or standing position. This is usually better than sitting to rest. ? When you rest for a long time, do some physical activity or stretching between periods of rest. ? Avoid sitting for a long time without moving. Get up and move around at  least one time each hour.  Exercise and stretch regularly, as told by your doctor.  Do not lift anything that is heavier than 10 lb (4.5 kg) while you have symptoms of sciatica. ? Avoid lifting heavy things even when you do not have symptoms. ? Avoid lifting heavy things over and over.  When you lift objects, always lift in a way that is safe for your body. To do this, you should: ? Bend your knees. ? Keep the object close to your body. ? Avoid twisting. General instructions  Use good posture. ? Avoid leaning forward when you are sitting. ? Avoid hunching over when you are standing.  Stay at a healthy weight.  Wear comfortable shoes that support your feet. Avoid wearing high heels.  Avoid sleeping on a mattress that is too soft or too hard. You might have less pain if you sleep on a mattress that is firm enough to support your back.  Keep all follow-up visits as told by your doctor. This is important. Contact a doctor if:  You have pain that: ? Wakes you up when you are sleeping. ? Gets worse when you lie down. ? Is worse than the pain you have had in the past. ? Lasts longer than 4 weeks.  You lose weight for without trying. Get help right away if:  You cannot control when you pee (urinate) or poop (have a bowel  movement).  You have weakness in any of these areas and it gets worse. ? Lower back. ? Lower belly (pelvis). ? Butt (buttocks). ? Legs.  You have redness or swelling of your back.  You have a burning feeling when you pee. This information is not intended to replace advice given to you by your health care provider. Make sure you discuss any questions you have with your health care provider. Document Released: 09/18/2008 Document Revised: 05/17/2016 Document Reviewed: 08/19/2015 Elsevier Interactive Patient Education  [employer].

## 2017-09-13 ENCOUNTER — Encounter: Payer: Self-pay | Admitting: Internal Medicine

## 2017-09-13 ENCOUNTER — Ambulatory Visit: Payer: Self-pay | Admitting: Internal Medicine

## 2017-09-30 ENCOUNTER — Other Ambulatory Visit: Payer: Self-pay | Admitting: Internal Medicine

## 2017-09-30 ENCOUNTER — Ambulatory Visit (HOSPITAL_COMMUNITY)
Admission: EM | Admit: 2017-09-30 | Discharge: 2017-09-30 | Disposition: A | Discharge status: Home / Self Care | Payer: 59 | Attending: Urgent Care | Admitting: Urgent Care

## 2017-09-30 ENCOUNTER — Encounter (HOSPITAL_COMMUNITY): Payer: Self-pay | Admitting: Emergency Medicine

## 2017-09-30 DIAGNOSIS — I10 Essential (primary) hypertension: Secondary | ICD-10-CM

## 2017-09-30 DIAGNOSIS — H1131 Conjunctival hemorrhage, right eye: Secondary | ICD-10-CM

## 2017-09-30 DIAGNOSIS — R51 Headache: Secondary | ICD-10-CM | POA: Diagnosis not present

## 2017-09-30 DIAGNOSIS — G8929 Other chronic pain: Secondary | ICD-10-CM

## 2017-09-30 DIAGNOSIS — R030 Elevated blood-pressure reading, without diagnosis of hypertension: Secondary | ICD-10-CM

## 2017-09-30 NOTE — ED Provider Notes (Signed)
MRN: [id number] DOB: Oct 13, 1951  Subjective:   Kaylee Adams is a 66 y.o. female presenting for chief complaint of Headache  Reports ~2 month history of intermittent frontal-temporal headache that can radiate to neck. Can have photophobia and throbbing behind her right eye. Denies phonophobia, n/v, weakness, numbness or tingling. Has a history of HTN, reports compliance. Denies smoking cigarettes or drinking alcohol. Tries to hydrate well, eats regular meals, does not drink a lot of caffeine. Sleeps ~7 hours per day.   No current facility-administered medications for this encounter.   Current Outpatient Prescriptions:  .  amLODipine (NORVASC) 5 MG tablet, TAKE 1 TABLET BY MOUTH ONCE DAILY, Disp: 90 tablet, Rfl: 0 .  losartan-hydrochlorothiazide (HYZAAR) 100-25 MG tablet, TAKE 1 TABLET BY MOUTH ONCE DAILY, Disp: 90 tablet, Rfl: 0 .  cyclobenzaprine (FLEXERIL) 10 MG tablet, Take 1 tablet (10 mg total) by mouth at bedtime., Disp: 15 tablet, Rfl: 0 .  methylPREDNISolone (MEDROL DOSEPAK) 4 MG TBPK tablet, Take as directed, Disp: 21 tablet, Rfl: 0   Kaylee Adams is allergic to lisinopril and penicillins.  Kaylee Adams  has a past medical history of CAD (coronary artery disease); Hepatic cyst; HLD (hyperlipidemia); HTN (hypertension); and Midsternal chest pain. Also  has a past surgical history that includes Vesicovaginal fistula closure w/ TAH; Breast biopsy; Cholecystectomy; child birth x 5; Abdominal hysterectomy; and Shoulder arthroscopy with rotator cuff repair and subacromial decompression (Right, 11/03/2013).  Objective:   Vitals: BP (!) 154/66 (BP Location: Left Arm)   Pulse 85   Temp 98.9 F (37.2 C) (Oral)   Resp 16   SpO2 98%   Physical Exam  Constitutional: She is oriented to person, place, and time. She appears well-developed and well-nourished.  HENT:  Mouth/Throat: Oropharynx is clear and moist.  Eyes: Pupils are equal, round, and reactive to light. EOM are normal.  Subconjunctival hemorrhage  of right medial eye noted.  Neck: Normal range of motion. Neck supple.  Cardiovascular: Normal rate, regular rhythm and intact distal pulses.  Exam reveals no gallop and no friction rub.   No murmur heard. Pulmonary/Chest: No respiratory distress. She has no wheezes. She has no rales.  Lymphadenopathy:    She has no cervical adenopathy.  Neurological: She is alert and oriented to person, place, and time. She displays normal reflexes. No cranial nerve deficit. Coordination normal.  Skin: Skin is warm and dry.  Psychiatric: She has a normal mood and affect.   Assessment and Plan :   Chronic nonintractable headache, unspecified headache type  Essential hypertension  Elevated blood pressure reading  Subconjunctival hemorrhage of right eye  IM Toradol offered to patient but she declined this and does not want to take medications really. I counseled that her headaches may be related to her blood pressure. I will have patient increase her dose of amlodipine to 10mg  daily and f/u with her PCP. Hydrate very well. Return-to-clinic precautions discussed, patient verbalized understanding.   Jaynee Eagles, PA-C [facility]  09/30/2017  2:14 PM    Jaynee Eagles, PA-C 10/02/17 1144

## 2017-09-30 NOTE — ED Triage Notes (Signed)
Pt here for intermittent right sided HA onset 1 month ++ associated w/right eye redness  Has not noticed if anything makes it worse  Voices no other concerns  A&O X4... NAD... Ambulatory

## 2017-09-30 NOTE — Discharge Instructions (Signed)
Please take double your dose of amlodipine. You should be taking 10mg  daily (2 tablets of the 5mg ). Check back with your PCP about frequent headaches and your high blood pressure, new dose of amlodipine.

## 2017-10-01 MED FILL — LOSARTAN-HCTZ 100-25 MG TAB: 100-25 | 90 days supply | Qty: 90 | Fill #0

## 2017-10-01 MED FILL — AMLODIPINE BESYLATE 5 MG TA: 5 | 90 days supply | Qty: 90 | Fill #0

## 2017-10-18 ENCOUNTER — Other Ambulatory Visit (INDEPENDENT_AMBULATORY_CARE_PROVIDER_SITE_OTHER): Payer: 59

## 2017-10-18 DIAGNOSIS — E785 Hyperlipidemia, unspecified: Secondary | ICD-10-CM | POA: Diagnosis not present

## 2017-10-18 LAB — TSH: TSH: 0.72 u[IU]/mL (ref 0.35–4.50)

## 2017-10-18 LAB — LIPID PANEL
CHOLESTEROL: 248 mg/dL — AB (ref 0–200)
HDL: 51.9 mg/dL (ref >39.00)
LDL Cholesterol: 164 mg/dL — ABNORMAL HIGH (ref 0–99)
NonHDL: 195.93
TRIGLYCERIDES: 158 mg/dL — AB (ref 0.0–149.0)
Total CHOL/HDL Ratio: 5
VLDL: 31.6 mg/dL (ref 0.0–40.0)

## 2017-10-18 LAB — T4, FREE: FREE T4: 0.77 ng/dL (ref 0.60–1.60)

## 2017-11-02 DIAGNOSIS — H524 Presbyopia: Secondary | ICD-10-CM | POA: Diagnosis not present

## 2017-11-29 ENCOUNTER — Telehealth: Payer: Self-pay | Admitting: Internal Medicine

## 2017-11-29 NOTE — Telephone Encounter (Signed)
Copied from [place] [id number]. Topic: Quick Communication - Rx Refill/Question >> Nov 29, 2017 12:23 PM Lennox Solders wrote: Has the patient contacted their pharmacy? yes  (Agent: If no, request that the patient contact the pharmacy for the refill.) pt amlodipine 5 mg once a day pill was increase to 5 mg twice a day. This medication was increase by [facility]  Pharmacy (with phone number or street name): [facility] [phone]. Pt would like 180 pills for 90 day supply   Agent: Please be advised that RX refills may take up to 3 business days. We ask that you follow-up with your pharmacy.

## 2017-12-02 NOTE — Telephone Encounter (Signed)
Ok to send in amlodipine 10 mg and have pt follow-up in office?

## 2017-12-02 NOTE — Telephone Encounter (Signed)
Urgent care did increase Amlodipine to 10 mg per note. Please place order for medication refills.

## 2017-12-06 MED ORDER — AMLODIPINE BESYLATE 10 MG PO TABS: 10.0000 mg | ORAL_TABLET | Freq: Every day | ORAL | 0 refills | Status: DC

## 2017-12-06 MED FILL — AMLODIPINE BESYLATE 10 MG T: 10 | 30 days supply | Qty: 30 | Fill #0

## 2017-12-06 NOTE — Telephone Encounter (Signed)
Medication filled to pharmacy as requested.   

## 2017-12-06 NOTE — Telephone Encounter (Signed)
Call in Amlodipine 10 mg daily #30 with no rf

## 2017-12-06 NOTE — Telephone Encounter (Signed)
Request already been sent to Dr Regis Bill for okay.  Will send to Dr Sarajane Jews to advise if he can make any adjustments or give recommendation on medication. Pts Amlodipine dose was increased to 10mg  (5mg  BID) by urgent care but they did not send a new Rx. Pt is completely out of her medication and needs this called in ASAP.   PHARMACY:  [facility]

## 2017-12-06 NOTE — Telephone Encounter (Signed)
Copied from [place] [id number]. Topic: Quick Communication - Rx Refill/Question >> Nov 29, 2017 12:23 PM Lennox Solders wrote: Has the patient contacted their pharmacy? yes  (Agent: If no, request that the patient contact the pharmacy for the refill.) pt amlodipine 5 mg one a day pill was increase to 5 mg twice a day. This medication was increase [facility]  Pharmacy (with phone number or street name): [facility] [phone]. Pt would like 180 pills for 90 day supply   Agent: Please be advised that RX refills may take up to 3 business days. We ask that you follow-up with your pharmacy. >> Dec 06, 2017 10:22 AM Robina Ade, Helene Kelp D wrote: Patient is out of her medication amlodipine since the urgent care increased the doze. Please call patient before sending the Rx to [facility], thanks.

## 2017-12-09 ENCOUNTER — Encounter: Payer: Self-pay | Admitting: Internal Medicine

## 2017-12-10 NOTE — Telephone Encounter (Signed)
See 11/29/17 telephone note - Rx was sent to [facility] on 12/06/17 Pt aware >> spoke on phone.  Pt going to call the pharmacy and check. Will call back if not there.  Nothing further needed.

## 2018-01-07 DIAGNOSIS — Z124 Encounter for screening for malignant neoplasm of cervix: Secondary | ICD-10-CM | POA: Diagnosis not present

## 2018-01-07 DIAGNOSIS — Z1231 Encounter for screening mammogram for malignant neoplasm of breast: Secondary | ICD-10-CM | POA: Diagnosis not present

## 2018-01-07 DIAGNOSIS — Z01419 Encounter for gynecological examination (general) (routine) without abnormal findings: Secondary | ICD-10-CM | POA: Diagnosis not present

## 2018-01-07 LAB — HM MAMMOGRAPHY

## 2018-01-21 ENCOUNTER — Encounter: Payer: Self-pay | Admitting: Internal Medicine

## 2018-01-28 ENCOUNTER — Encounter: Payer: Self-pay | Admitting: Gastroenterology

## 2018-01-30 ENCOUNTER — Encounter: Payer: Self-pay | Admitting: Internal Medicine

## 2018-01-30 DIAGNOSIS — E785 Hyperlipidemia, unspecified: Secondary | ICD-10-CM

## 2018-01-30 DIAGNOSIS — Z0000 Encounter for general adult medical examination without abnormal findings: Secondary | ICD-10-CM

## 2018-01-30 DIAGNOSIS — I10 Essential (primary) hypertension: Secondary | ICD-10-CM

## 2018-01-30 NOTE — Telephone Encounter (Signed)
Please advise Dr Regis Bill, thanks. Fasting labs? Anything that can be done prior to OV?

## 2018-01-30 NOTE — Telephone Encounter (Signed)
I put in orders for chemistry and lipids she can do before visit     No need for anemia and thyroid check cause already done recently  assuming  Every thing else  Stable

## 2018-01-30 NOTE — Telephone Encounter (Signed)
Labs placed by Dr Regis Bill Patient aware that she can come early and have labs drawn prior to visit.  Nothing further needed.

## 2018-02-06 ENCOUNTER — Encounter: Payer: 59 | Admitting: Internal Medicine

## 2018-02-07 ENCOUNTER — Ambulatory Visit (INDEPENDENT_AMBULATORY_CARE_PROVIDER_SITE_OTHER): Payer: Medicare Other | Admitting: Internal Medicine

## 2018-02-07 ENCOUNTER — Encounter: Payer: Self-pay | Admitting: Internal Medicine

## 2018-02-07 VITALS — BP 124/78 | HR 72 | Temp 98.2°F | Ht 59.5 in | Wt 157.5 lb

## 2018-02-07 DIAGNOSIS — I10 Essential (primary) hypertension: Secondary | ICD-10-CM

## 2018-02-07 DIAGNOSIS — Z0000 Encounter for general adult medical examination without abnormal findings: Secondary | ICD-10-CM | POA: Diagnosis not present

## 2018-02-07 DIAGNOSIS — Z23 Encounter for immunization: Secondary | ICD-10-CM

## 2018-02-07 DIAGNOSIS — E2839 Other primary ovarian failure: Secondary | ICD-10-CM | POA: Diagnosis not present

## 2018-02-07 DIAGNOSIS — E785 Hyperlipidemia, unspecified: Secondary | ICD-10-CM | POA: Diagnosis not present

## 2018-02-07 LAB — COMPREHENSIVE METABOLIC PANEL
ALT: 17 U/L (ref 0–35)
AST: 16 U/L (ref 0–37)
Albumin: 4.3 g/dL (ref 3.5–5.2)
Alkaline Phosphatase: 50 U/L (ref 39–117)
BUN: 15 mg/dL (ref 6–23)
CHLORIDE: 104 meq/L (ref 96–112)
CO2: 30 mEq/L (ref 19–32)
Calcium: 10.1 mg/dL (ref 8.4–10.5)
Creatinine, Ser: 0.81 mg/dL (ref 0.40–1.20)
GFR: 90.96 mL/min (ref >60.00)
GLUCOSE: 97 mg/dL (ref 70–99)
POTASSIUM: 3.8 meq/L (ref 3.5–5.1)
SODIUM: 141 meq/L (ref 135–145)
Total Bilirubin: 0.5 mg/dL (ref 0.2–1.2)
Total Protein: 6.7 g/dL (ref 6.0–8.3)

## 2018-02-07 LAB — LIPID PANEL
Cholesterol: 237 mg/dL — ABNORMAL HIGH (ref 0–200)
HDL: 50.1 mg/dL (ref >39.00)
LDL CALC: 172 mg/dL — AB (ref 0–99)
NONHDL: 187.04
Total CHOL/HDL Ratio: 5
Triglycerides: 74 mg/dL (ref 0.0–149.0)
VLDL: 14.8 mg/dL (ref 0.0–40.0)

## 2018-02-07 MED ORDER — ZOSTER VAC RECOMB ADJUVANTED 50 MCG/0.5ML IM SUSR: 0.5000 mL | Freq: Once | INTRAMUSCULAR | 1 refills | Status: AC

## 2018-02-07 MED ORDER — AMLODIPINE BESYLATE 10 MG PO TABS: 10.0000 mg | ORAL_TABLET | Freq: Every day | ORAL | 3 refills | Status: DC

## 2018-02-07 MED ORDER — LOSARTAN POTASSIUM-HCTZ 100-25 MG PO TABS: 1.0000 | ORAL_TABLET | Freq: Every day | ORAL | 3 refills | Status: DC

## 2018-02-07 NOTE — Progress Notes (Signed)
Chief Complaint  Patient presents with  . Annual Exam    No new concerns welcome to medicare  . Hypertension  . Medication Management    HPI: Kaylee Adams 67 y.o. comes in today for Preventive Medicare exam visit . And medications  Has retired in [place] and feeling better   And bp has been better lost weight and needs refill med     Health Maintenance  Topic Date Due  . DEXA SCAN  01/10/2017  . PNA vac Low Risk Adult (2 of 2 - PPSV23) 02/01/2018  . COLONOSCOPY  12/30/2018  . MAMMOGRAM  01/08/2020  . TETANUS/TDAP  02/01/2027  . INFLUENZA VACCINE  Completed  . Hepatitis C Screening  Completed   Health Maintenance Review LIFESTYLE:  Exercise:  Step machine   Tobacco/ETS: no Alcohol:  no Sugar beverages:  Cut out  Sleep: much better 7- 8  Drug use: no HH:  Retired now  And  Feels a lot   Better .  January  4th .  4 no pets    Hearing: ok  Vision:  No limitations at present . Last eye check UTD  Safety:  Has smoke detector and wears seat belts.  No firearms.  Sees dentist regularly.  Falls: no  Memory: Felt to be good  , no concern from her or her family.  Depression: No anhedonia unusual crying or depressive symptoms  Nutrition: Eats well balanced diet; adequate calcium and vitamin D. No swallowing chewing problems.  Injury: no major injuries in the last six months.  Other healthcare providers:  Reviewed today .  Social:  Lives with spouse married. No pets.   Preventive parameters: up-to-date  Reviewed   ADLS:   There are no problems or need for assistance  driving, feeding, obtaining food, dressing, toileting and bathing, managing money using phone. She is independent.   ROS:  GEN/ HEENT: No fever, significant weight changes sweats headaches vision problems hearing changes, CV/ PULM; No chest pain shortness of breath cough, syncope,edema  change in exercise tolerance. GI /GU: No adominal pain, vomiting, change in bowel habits. No blood in the stool. No  significant GU symptoms. SKIN/HEME: ,no acute skin rashes suspicious lesions or bleeding. No lymphadenopathy, nodules, masses.  NEURO/ PSYCH:  No neurologic signs such as weakness numbness. No depression anxiety. IMM/ Allergy: No unusual infections.  Allergy .   REST of 12 system review negative except as per HPI   Past Medical History:  Diagnosis Date  . CAD (coronary artery disease)    a. 11/2005 Cath: nonobs dzs, 30% LAD lesion on cath ;  b. 11/2005 Echo: NL EF;  c. 04/2012 Echo: EF 55-60%, Gr 2 DD;  d. 04/2012 Ex MV: EF 72%, ST depression in recovery with NL perfusion imaging - HTN response to exercise.  . Hepatic cyst    on ct [place]  . HLD (hyperlipidemia)   . HTN (hypertension)   . Midsternal chest pain    cardiology eval with non-ischemic stress test 04/2012    Family History  Problem Relation Age of Onset  . Breast cancer Sister   . Thyroid disease Sister   . Hypertension Mother     Social History   Socioeconomic History  . Marital status: Married    Spouse name: None  . Number of children: 5  . Years of education: None  . Highest education level: None  Social Needs  . Financial resource strain: None  . Food insecurity - worry: None  .  Food insecurity - inability: None  . Transportation needs - medical: None  . Transportation needs - non-medical: None  Occupational History    Employer: [employer]  Tobacco Use  . Smoking status: Never Smoker  . Smokeless tobacco: Never Used  Substance and Sexual Activity  . Alcohol use: No  . Drug use: No  . Sexual activity: None  Other Topics Concern  . None  Social History Narrative   Married, full time Network engineer. 3 pets    [place]   6-8 hours sleep     Outpatient Encounter Medications as of 02/07/2018  Medication Sig  . amLODipine (NORVASC) 10 MG tablet Take 1 tablet (10 mg total) by mouth daily.  . cyclobenzaprine (FLEXERIL) 10 MG tablet Take 1 tablet (10 mg total) by mouth at bedtime.  Marland Kitchen  losartan-hydrochlorothiazide (HYZAAR) 100-25 MG tablet Take 1 tablet by mouth daily.  . [DISCONTINUED] amLODipine (NORVASC) 10 MG tablet Take 1 tablet (10 mg total) by mouth daily.  . [DISCONTINUED] losartan-hydrochlorothiazide (HYZAAR) 100-25 MG tablet TAKE 1 TABLET BY MOUTH ONCE DAILY  . Zoster Vaccine Adjuvanted [facility]) injection Inject 0.5 mLs into the muscle once for 1 dose. Repeat in 2-6 months  . [DISCONTINUED] methylPREDNISolone (MEDROL DOSEPAK) 4 MG TBPK tablet Take as directed (Patient not taking: Reported on 02/07/2018)   No facility-administered encounter medications on file as of 02/07/2018.     EXAM:  BP 124/78 (BP Location: Right Arm, Patient Position: Sitting, Cuff Size: Normal)   Pulse 72   Temp 98.2 F (36.8 C) (Oral)   Ht 4' 11.5" (1.511 m)   Wt 157 lb 8 oz (71.4 kg)   BMI 31.28 kg/m   Body mass index is 31.28 kg/m.  Physical Exam: Vital signs reviewed PNT:IRWE is a well-developed well-nourished alert cooperative   who appears stated age in no acute distress.  HEENT: normocephalic atraumatic , Eyes: PERRL EOM's full, conjunctiva clear, Nares: paten,t no deformity discharge or tenderness., Ears: no deformity EAC's clear TMs with normal landmarks. Mouth: clear OP, no lesions, edema.  Moist mucous membranes. Dentition in adequate repair. NECK: supple without masses, thyromegaly or bruits. CHEST/PULM:  Clear to auscultation and percussion breath sounds equal no wheeze , rales or rhonchi. No chest wall deformities or tenderness. CV: PMI is nondisplaced, S1 S2 no gallops, murmurs, rubs. Peripheral pulses are full without delay.No JVD . Breast: normal by inspection . No dimpling, discharge, masses, tenderness or discharge . ABDOMEN: Bowel sounds normal nontender  No guard or rebound, no hepato splenomegal no CVA tenderness.   Extremtities:  No clubbing cyanosis or edema, no acute joint swelling or redness no focal atrophy NEURO:  Oriented x3, cranial nerves 3-12 appear to  be intact, no obvious focal weakness,gait within normal limits no abnormal reflexes or asymmetrical SKIN: No acute rashes normal turgor, color, no bruising or petechiae. PSYCH: Oriented, good eye contact, no obvious depression anxiety, cognition and judgment appear normal. LN: no cervical axillary inguinal adenopathy No noted deficits in memory, attention, and speech.     ASSESSMENT AND PLAN:  Discussed the following assessment and plan:  Welcome to Medicare preventive visit  Hyperlipidemia, unspecified hyperlipidemia type - Plan: Lipid panel, Comprehensive metabolic panel  Essential hypertension - Plan: Lipid panel, Comprehensive metabolic panel  Estrogen deficiency - Plan: DG Bone Density  Need for pneumococcal vaccination - Plan: Pneumococcal polysaccharide vaccine 23-valent greater than or equal to 2yo subcutaneous/IM Reviewed plan  immuniz  yearly check up if doing well  Blood monitoring pending  .  Refill medication    Consideration of  Other  rx for lipids if appropriate  Appears    Retirement has helped her health and well being.  The 10-year ASCVD risk score Mikey Bussing DC Brooke Bonito., et al., 2013) is: 11%   Values used to calculate the score:     Age: 4 years     Sex: Female     Is Non-Hispanic African American: Yes     Diabetic: No     Tobacco smoker: No     Systolic Blood Pressure: 366 mmHg     Is BP treated: Yes     HDL Cholesterol: 51.9 mg/dL     Total Cholesterol: 248 mg/dL  Patient Care Team: Burnis Medin, MD as PCP - General Milus Banister, MD (Gastroenterology) Fay Records, MD (Cardiology)  Patient Instructions  Make   appt for dexa   Bone density appt  At the [place]   [facility]   Glad you are doing well Continue lifestyle intervention healthy eating and exercise .   If lab and dexa ok then see you in a year    Health Maintenance, Female Adopting a healthy lifestyle and getting preventive care can go a long way to promote health and wellness. Talk  with your health care provider about what schedule of regular examinations is right for you. This is a good chance for you to check in with your provider about disease prevention and staying healthy. In between checkups, there are plenty of things you can do on your own. Experts have done a lot of research about which lifestyle changes and preventive measures are most likely to keep you healthy. Ask your health care provider for more information. Weight and diet Eat a healthy diet  Be sure to include plenty of vegetables, fruits, low-fat dairy products, and lean protein.  Do not eat a lot of foods high in solid fats, added sugars, or salt.  Get regular exercise. This is one of the most important things you can do for your health. ? Most adults should exercise for at least 150 minutes each week. The exercise should increase your heart rate and make you sweat (moderate-intensity exercise). ? Most adults should also do strengthening exercises at least twice a week. This is in addition to the moderate-intensity exercise.  Maintain a healthy weight  Body mass index (BMI) is a measurement that can be used to identify possible weight problems. It estimates body fat based on height and weight. Your health care provider can help determine your BMI and help you achieve or maintain a healthy weight.  For females 29 years of age and older: ? A BMI below 18.5 is considered underweight. ? A BMI of 18.5 to 24.9 is normal. ? A BMI of 25 to 29.9 is considered overweight. ? A BMI of 30 and above is considered obese.  Watch levels of cholesterol and blood lipids  You should start having your blood tested for lipids and cholesterol at 66 years of age, then have this test every 5 years.  You may need to have your cholesterol levels checked more often if: ? Your lipid or cholesterol levels are high. ? You are older than 66 years of age. ? You are at high risk for heart disease.  Cancer screening Lung  Cancer  Lung cancer screening is recommended for adults 57-50 years old who are at high risk for lung cancer because of a history of smoking.  A yearly low-dose CT scan of  the lungs is recommended for people who: ? Currently smoke. ? Have quit within the past 15 years. ? Have at least a 30-pack-year history of smoking. A pack year is smoking an average of one pack of cigarettes a day for 1 year.  Yearly screening should continue until it has been 15 years since you quit.  Yearly screening should stop if you develop a health problem that would prevent you from having lung cancer treatment.  Breast Cancer  Practice breast self-awareness. This means understanding how your breasts normally appear and feel.  It also means doing regular breast self-exams. Let your health care provider know about any changes, no matter how small.  If you are in your 20s or 30s, you should have a clinical breast exam (CBE) by a health care provider every 1-3 years as part of a regular health exam.  If you are 64 or older, have a CBE every year. Also consider having a breast X-ray (mammogram) every year.  If you have a family history of breast cancer, talk to your health care provider about genetic screening.  If you are at high risk for breast cancer, talk to your health care provider about having an MRI and a mammogram every year.  Breast cancer gene (BRCA) assessment is recommended for women who have family members with BRCA-related cancers. BRCA-related cancers include: ? Breast. ? Ovarian. ? Tubal. ? Peritoneal cancers.  Results of the assessment will determine the need for genetic counseling and BRCA1 and BRCA2 testing.  Cervical Cancer Your health care provider may recommend that you be screened regularly for cancer of the pelvic organs (ovaries, uterus, and vagina). This screening involves a pelvic examination, including checking for microscopic changes to the surface of your cervix (Pap test). You  may be encouraged to have this screening done every 3 years, beginning at age 19.  For women ages 92-65, health care providers may recommend pelvic exams and Pap testing every 3 years, or they may recommend the Pap and pelvic exam, combined with testing for human papilloma virus (HPV), every 5 years. Some types of HPV increase your risk of cervical cancer. Testing for HPV may also be done on women of any age with unclear Pap test results.  Other health care providers may not recommend any screening for nonpregnant women who are considered low risk for pelvic cancer and who do not have symptoms. Ask your health care provider if a screening pelvic exam is right for you.  If you have had past treatment for cervical cancer or a condition that could lead to cancer, you need Pap tests and screening for cancer for at least 20 years after your treatment. If Pap tests have been discontinued, your risk factors (such as having a new sexual partner) need to be reassessed to determine if screening should resume. Some women have medical problems that increase the chance of getting cervical cancer. In these cases, your health care provider may recommend more frequent screening and Pap tests.  Colorectal Cancer  This type of cancer can be detected and often prevented.  Routine colorectal cancer screening usually begins at 66 years of age and continues through 66 years of age.  Your health care provider may recommend screening at an earlier age if you have risk factors for colon cancer.  Your health care provider may also recommend using home test kits to check for hidden blood in the stool.  A small camera at the end of a tube can be used to  examine your colon directly (sigmoidoscopy or colonoscopy). This is done to check for the earliest forms of colorectal cancer.  Routine screening usually begins at age 57.  Direct examination of the colon should be repeated every 5-10 years through 66 years of age.  However, you may need to be screened more often if early forms of precancerous polyps or small growths are found.  Skin Cancer  Check your skin from head to toe regularly.  Tell your health care provider about any new moles or changes in moles, especially if there is a change in a mole's shape or color.  Also tell your health care provider if you have a mole that is larger than the size of a pencil eraser.  Always use sunscreen. Apply sunscreen liberally and repeatedly throughout the day.  Protect yourself by wearing long sleeves, pants, a wide-brimmed hat, and sunglasses whenever you are outside.  Heart disease, diabetes, and high blood pressure  High blood pressure causes heart disease and increases the risk of stroke. High blood pressure is more likely to develop in: ? People who have blood pressure in the high end of the normal range (130-139/85-89 mm Hg). ? People who are overweight or obese. ? People who are African American.  If you are 28-21 years of age, have your blood pressure checked every 3-5 years. If you are 75 years of age or older, have your blood pressure checked every year. You should have your blood pressure measured twice-once when you are at a hospital or clinic, and once when you are not at a hospital or clinic. Record the average of the two measurements. To check your blood pressure when you are not at a hospital or clinic, you can use: ? An automated blood pressure machine at a pharmacy. ? A home blood pressure monitor.  If you are between 68 years and 13 years old, ask your health care provider if you should take aspirin to prevent strokes.  Have regular diabetes screenings. This involves taking a blood sample to check your fasting blood sugar level. ? If you are at a normal weight and have a low risk for diabetes, have this test once every three years after 66 years of age. ? If you are overweight and have a high risk for diabetes, consider being tested at a  younger age or more often. Preventing infection Hepatitis B  If you have a higher risk for hepatitis B, you should be screened for this virus. You are considered at high risk for hepatitis B if: ? You were born in a country where hepatitis B is common. Ask your health care provider which countries are considered high risk. ? Your parents were born in a high-risk country, and you have not been immunized against hepatitis B (hepatitis B vaccine). ? You have HIV or AIDS. ? You use needles to inject street drugs. ? You live with someone who has hepatitis B. ? You have had sex with someone who has hepatitis B. ? You get hemodialysis treatment. ? You take certain medicines for conditions, including cancer, organ transplantation, and autoimmune conditions.  Hepatitis C  Blood testing is recommended for: ? Everyone born from [birth year] through 1964. ? Anyone with known risk factors for hepatitis C.  Sexually transmitted infections (STIs)  You should be screened for sexually transmitted infections (STIs) including gonorrhea and chlamydia if: ? You are sexually active and are younger than 66 years of age. ? You are older than 66 years of age and  your health care provider tells you that you are at risk for this type of infection. ? Your sexual activity has changed since you were last screened and you are at an increased risk for chlamydia or gonorrhea. Ask your health care provider if you are at risk.  If you do not have HIV, but are at risk, it may be recommended that you take a prescription medicine daily to prevent HIV infection. This is called pre-exposure prophylaxis (PrEP). You are considered at risk if: ? You are sexually active and do not regularly use condoms or know the HIV status of your partner(s). ? You take drugs by injection. ? You are sexually active with a partner who has HIV.  Talk with your health care provider about whether you are at high risk of being infected with HIV. If you  choose to begin PrEP, you should first be tested for HIV. You should then be tested every 3 months for as long as you are taking PrEP. Pregnancy  If you are premenopausal and you may become pregnant, ask your health care provider about preconception counseling.  If you may become pregnant, take 400 to 800 micrograms (mcg) of folic acid every day.  If you want to prevent pregnancy, talk to your health care provider about birth control (contraception). Osteoporosis and menopause  Osteoporosis is a disease in which the bones lose minerals and strength with aging. This can result in serious bone fractures. Your risk for osteoporosis can be identified using a bone density scan.  If you are 67 years of age or older, or if you are at risk for osteoporosis and fractures, ask your health care provider if you should be screened.  Ask your health care provider whether you should take a calcium or vitamin D supplement to lower your risk for osteoporosis.  Menopause may have certain physical symptoms and risks.  Hormone replacement therapy may reduce some of these symptoms and risks. Talk to your health care provider about whether hormone replacement therapy is right for you. Follow these instructions at home:  Schedule regular health, dental, and eye exams.  Stay current with your immunizations.  Do not use any tobacco products including cigarettes, chewing tobacco, or electronic cigarettes.  If you are pregnant, do not drink alcohol.  If you are breastfeeding, limit how much and how often you drink alcohol.  Limit alcohol intake to no more than 1 drink per day for nonpregnant women. One drink equals 12 ounces of beer, 5 ounces of wine, or 1 ounces of hard liquor.  Do not use street drugs.  Do not share needles.  Ask your health care provider for help if you need support or information about quitting drugs.  Tell your health care provider if you often feel depressed.  Tell your health  care provider if you have ever been abused or do not feel safe at home. This information is not intended to replace advice given to you by your health care provider. Make sure you discuss any questions you have with your health care provider. Document Released: 06/25/2011 Document Revised: 05/17/2016 Document Reviewed: 09/13/2015 Elsevier Interactive Patient Education  2018 [place]. Julen Rubert M.D.

## 2018-02-07 NOTE — Patient Instructions (Addendum)
Make   appt for dexa   Bone density appt  At the [place] you are doing well Continue lifestyle intervention healthy eating and exercise .   If lab and dexa ok then see you in a year    Health Maintenance, Female Adopting a healthy lifestyle and getting preventive care can go a long way to promote health and wellness. Talk with your health care provider about what schedule of regular examinations is right for you. This is a good chance for you to check in with your provider about disease prevention and staying healthy. In between checkups, there are plenty of things you can do on your own. Experts have done a lot of research about which lifestyle changes and preventive measures are most likely to keep you healthy. Ask your health care provider for more information. Weight and diet Eat a healthy diet  Be sure to include plenty of vegetables, fruits, low-fat dairy products, and lean protein.  Do not eat a lot of foods high in solid fats, added sugars, or salt.  Get regular exercise. This is one of the most important things you can do for your health. ? Most adults should exercise for at least 150 minutes each week. The exercise should increase your heart rate and make you sweat (moderate-intensity exercise). ? Most adults should also do strengthening exercises at least twice a week. This is in addition to the moderate-intensity exercise.  Maintain a healthy weight  Body mass index (BMI) is a measurement that can be used to identify possible weight problems. It estimates body fat based on height and weight. Your health care provider can help determine your BMI and help you achieve or maintain a healthy weight.  For females 54 years of age and older: ? A BMI below 18.5 is considered underweight. ? A BMI of 18.5 to 24.9 is normal. ? A BMI of 25 to 29.9 is considered overweight. ? A BMI of 30 and above is considered obese.  Watch levels of cholesterol and blood  lipids  You should start having your blood tested for lipids and cholesterol at 66 years of age, then have this test every 5 years.  You may need to have your cholesterol levels checked more often if: ? Your lipid or cholesterol levels are high. ? You are older than 66 years of age. ? You are at high risk for heart disease.  Cancer screening Lung Cancer  Lung cancer screening is recommended for adults 45-83 years old who are at high risk for lung cancer because of a history of smoking.  A yearly low-dose CT scan of the lungs is recommended for people who: ? Currently smoke. ? Have quit within the past 15 years. ? Have at least a 30-pack-year history of smoking. A pack year is smoking an average of one pack of cigarettes a day for 1 year.  Yearly screening should continue until it has been 15 years since you quit.  Yearly screening should stop if you develop a health problem that would prevent you from having lung cancer treatment.  Breast Cancer  Practice breast self-awareness. This means understanding how your breasts normally appear and feel.  It also means doing regular breast self-exams. Let your health care provider know about any changes, no matter how small.  If you are in your 20s or 30s, you should have a clinical breast exam (CBE) by a health care provider every 1-3 years as part of a  regular health exam.  If you are 40 or older, have a CBE every year. Also consider having a breast X-ray (mammogram) every year.  If you have a family history of breast cancer, talk to your health care provider about genetic screening.  If you are at high risk for breast cancer, talk to your health care provider about having an MRI and a mammogram every year.  Breast cancer gene (BRCA) assessment is recommended for women who have family members with BRCA-related cancers. BRCA-related cancers include: ? Breast. ? Ovarian. ? Tubal. ? Peritoneal cancers.  Results of the assessment will  determine the need for genetic counseling and BRCA1 and BRCA2 testing.  Cervical Cancer Your health care provider may recommend that you be screened regularly for cancer of the pelvic organs (ovaries, uterus, and vagina). This screening involves a pelvic examination, including checking for microscopic changes to the surface of your cervix (Pap test). You may be encouraged to have this screening done every 3 years, beginning at age 61.  For women ages 32-65, health care providers may recommend pelvic exams and Pap testing every 3 years, or they may recommend the Pap and pelvic exam, combined with testing for human papilloma virus (HPV), every 5 years. Some types of HPV increase your risk of cervical cancer. Testing for HPV may also be done on women of any age with unclear Pap test results.  Other health care providers may not recommend any screening for nonpregnant women who are considered low risk for pelvic cancer and who do not have symptoms. Ask your health care provider if a screening pelvic exam is right for you.  If you have had past treatment for cervical cancer or a condition that could lead to cancer, you need Pap tests and screening for cancer for at least 20 years after your treatment. If Pap tests have been discontinued, your risk factors (such as having a new sexual partner) need to be reassessed to determine if screening should resume. Some women have medical problems that increase the chance of getting cervical cancer. In these cases, your health care provider may recommend more frequent screening and Pap tests.  Colorectal Cancer  This type of cancer can be detected and often prevented.  Routine colorectal cancer screening usually begins at 66 years of age and continues through 66 years of age.  Your health care provider may recommend screening at an earlier age if you have risk factors for colon cancer.  Your health care provider may also recommend using home test kits to check  for hidden blood in the stool.  A small camera at the end of a tube can be used to examine your colon directly (sigmoidoscopy or colonoscopy). This is done to check for the earliest forms of colorectal cancer.  Routine screening usually begins at age 21.  Direct examination of the colon should be repeated every 5-10 years through 66 years of age. However, you may need to be screened more often if early forms of precancerous polyps or small growths are found.  Skin Cancer  Check your skin from head to toe regularly.  Tell your health care provider about any new moles or changes in moles, especially if there is a change in a mole's shape or color.  Also tell your health care provider if you have a mole that is larger than the size of a pencil eraser.  Always use sunscreen. Apply sunscreen liberally and repeatedly throughout the day.  Protect yourself by wearing long sleeves, pants,  a wide-brimmed hat, and sunglasses whenever you are outside.  Heart disease, diabetes, and high blood pressure  High blood pressure causes heart disease and increases the risk of stroke. High blood pressure is more likely to develop in: ? People who have blood pressure in the high end of the normal range (130-139/85-89 mm Hg). ? People who are overweight or obese. ? People who are African American.  If you are 35-32 years of age, have your blood pressure checked every 3-5 years. If you are 1 years of age or older, have your blood pressure checked every year. You should have your blood pressure measured twice-once when you are at a hospital or clinic, and once when you are not at a hospital or clinic. Record the average of the two measurements. To check your blood pressure when you are not at a hospital or clinic, you can use: ? An automated blood pressure machine at a pharmacy. ? A home blood pressure monitor.  If you are between 27 years and 28 years old, ask your health care provider if you should take  aspirin to prevent strokes.  Have regular diabetes screenings. This involves taking a blood sample to check your fasting blood sugar level. ? If you are at a normal weight and have a low risk for diabetes, have this test once every three years after 66 years of age. ? If you are overweight and have a high risk for diabetes, consider being tested at a younger age or more often. Preventing infection Hepatitis B  If you have a higher risk for hepatitis B, you should be screened for this virus. You are considered at high risk for hepatitis B if: ? You were born in a country where hepatitis B is common. Ask your health care provider which countries are considered high risk. ? Your parents were born in a high-risk country, and you have not been immunized against hepatitis B (hepatitis B vaccine). ? You have HIV or AIDS. ? You use needles to inject street drugs. ? You live with someone who has hepatitis B. ? You have had sex with someone who has hepatitis B. ? You get hemodialysis treatment. ? You take certain medicines for conditions, including cancer, organ transplantation, and autoimmune conditions.  Hepatitis C  Blood testing is recommended for: ? Everyone born from [birth year] through 1964. ? Anyone with known risk factors for hepatitis C.  Sexually transmitted infections (STIs)  You should be screened for sexually transmitted infections (STIs) including gonorrhea and chlamydia if: ? You are sexually active and are younger than 66 years of age. ? You are older than 66 years of age and your health care provider tells you that you are at risk for this type of infection. ? Your sexual activity has changed since you were last screened and you are at an increased risk for chlamydia or gonorrhea. Ask your health care provider if you are at risk.  If you do not have HIV, but are at risk, it may be recommended that you take a prescription medicine daily to prevent HIV infection. This is called  pre-exposure prophylaxis (PrEP). You are considered at risk if: ? You are sexually active and do not regularly use condoms or know the HIV status of your partner(s). ? You take drugs by injection. ? You are sexually active with a partner who has HIV.  Talk with your health care provider about whether you are at high risk of being infected with HIV. If you choose  to begin PrEP, you should first be tested for HIV. You should then be tested every 3 months for as long as you are taking PrEP. Pregnancy  If you are premenopausal and you may become pregnant, ask your health care provider about preconception counseling.  If you may become pregnant, take 400 to 800 micrograms (mcg) of folic acid every day.  If you want to prevent pregnancy, talk to your health care provider about birth control (contraception). Osteoporosis and menopause  Osteoporosis is a disease in which the bones lose minerals and strength with aging. This can result in serious bone fractures. Your risk for osteoporosis can be identified using a bone density scan.  If you are 88 years of age or older, or if you are at risk for osteoporosis and fractures, ask your health care provider if you should be screened.  Ask your health care provider whether you should take a calcium or vitamin D supplement to lower your risk for osteoporosis.  Menopause may have certain physical symptoms and risks.  Hormone replacement therapy may reduce some of these symptoms and risks. Talk to your health care provider about whether hormone replacement therapy is right for you. Follow these instructions at home:  Schedule regular health, dental, and eye exams.  Stay current with your immunizations.  Do not use any tobacco products including cigarettes, chewing tobacco, or electronic cigarettes.  If you are pregnant, do not drink alcohol.  If you are breastfeeding, limit how much and how often you drink alcohol.  Limit alcohol intake to no more  than 1 drink per day for nonpregnant women. One drink equals 12 ounces of beer, 5 ounces of wine, or 1 ounces of hard liquor.  Do not use street drugs.  Do not share needles.  Ask your health care provider for help if you need support or information about quitting drugs.  Tell your health care provider if you often feel depressed.  Tell your health care provider if you have ever been abused or do not feel safe at home. This information is not intended to replace advice given to you by your health care provider. Make sure you discuss any questions you have with your health care provider. Document Released: 06/25/2011 Document Revised: 05/17/2016 Document Reviewed: 09/13/2015 Elsevier Interactive Patient Education  [employer].

## 2018-02-24 ENCOUNTER — Telehealth: Payer: Self-pay | Admitting: Internal Medicine

## 2018-02-24 NOTE — Telephone Encounter (Signed)
Pt returned call, given lab results per notes of Dr. Regis Bill on 02/12/18 and 02/15/18. Pt verbalized understanding. Repeat lab appointment scheduled for July 25 at [place]. Unable to document in result note due to result note not routed to [facility].

## 2018-02-26 ENCOUNTER — Telehealth: Payer: Self-pay

## 2018-02-26 NOTE — Telephone Encounter (Signed)
Will wait on forms

## 2018-02-26 NOTE — Telephone Encounter (Signed)
Copied from [place]. Topic: General - Other >> Feb 26, 2018  3:43 PM Oneta Rack wrote: Relation to pt: self  Call back number: [phone]   Reason for call:  Patient faxing over Health Examination Certificate form for [employer] to [phone], informed patient please allow 5 to 7 business day turn around, patient would like to pick up form when completed.  >> Feb 26, 2018  3:51 PM Oneta Rack wrote: Relation to pt: self  Call back number: [phone]   Reason for call:  Patient faxing over Health Examination Certificate form for [employer] to [phone], informed patient please allow 5 to 7 business day turn around, patient would like to pick up form when completed.

## 2018-02-28 ENCOUNTER — Ambulatory Visit: Payer: Medicare Other

## 2018-02-28 ENCOUNTER — Telehealth: Payer: Self-pay | Admitting: Internal Medicine

## 2018-02-28 NOTE — Telephone Encounter (Signed)
Copied from [place] [id number]. Topic: Inquiry >> Feb 28, 2018 11:17 AM Arletha Grippe wrote: Reason for CRM: pt would like to know if medicare does not cover her tb test, how much will it cost.  Also pt is asking for tdap shot, her daughter is having a baby next month. She would also like to know how much the cost is for that.  She would like to have this on Monday as well   Please call [phone]

## 2018-02-28 NOTE — Telephone Encounter (Signed)
Pt notified that approximate cost of TB skin test is $13 and that she is UTD on Tdap and verbalized understanding.

## 2018-03-03 ENCOUNTER — Ambulatory Visit (INDEPENDENT_AMBULATORY_CARE_PROVIDER_SITE_OTHER): Payer: Medicare Other | Admitting: Family Medicine

## 2018-03-03 DIAGNOSIS — Z23 Encounter for immunization: Secondary | ICD-10-CM

## 2018-03-05 LAB — TB SKIN TEST
Induration: 0 mm
TB Skin Test: NEGATIVE

## 2018-03-06 ENCOUNTER — Encounter: Payer: Self-pay | Admitting: *Deleted

## 2018-03-06 NOTE — Telephone Encounter (Signed)
Patient brought form to office when she had TB skin test read yesterday. Form completed as much as possible and placed in provider's red folder for signature.   Patient would like to pick up completed form on Monday 03/10/18 if possible. She needs to submit her information no later than Wednesday 03/12/18. Please call patient when form is ready for pick up.

## 2018-03-06 NOTE — Telephone Encounter (Signed)
This encounter was created in error - please disregard.

## 2018-03-10 NOTE — Telephone Encounter (Signed)
Form signed by provider and filed at front desk for pick up.  Called patient to notify her. Copy sent for scanning.

## 2018-06-02 ENCOUNTER — Ambulatory Visit (INDEPENDENT_AMBULATORY_CARE_PROVIDER_SITE_OTHER): Payer: Medicare Other | Admitting: Internal Medicine

## 2018-06-02 VITALS — BP 142/80 | HR 80 | Temp 98.4°F | Wt 161.4 lb

## 2018-06-02 DIAGNOSIS — M79601 Pain in right arm: Secondary | ICD-10-CM | POA: Diagnosis not present

## 2018-06-02 DIAGNOSIS — E785 Hyperlipidemia, unspecified: Secondary | ICD-10-CM | POA: Diagnosis not present

## 2018-06-02 DIAGNOSIS — M79605 Pain in left leg: Secondary | ICD-10-CM | POA: Diagnosis not present

## 2018-06-02 NOTE — Patient Instructions (Addendum)
Try  Ibuprofen  400 - 600 mg  Every  8- 12 hours for  7-10 days with stomach precautions .     And   relative rest  Of arm elbow and ice at end of day  I think this is overuse  .    Tendinitis type of  Problem .   Exam is reassuring today but could be  A pinched nerve in back    That could be causing the problem with the leg giving out.    If recurring leg giving out or weakness   Or ongoing then contact us and advise a referral orthopedics .   Get a fasting lipid panel when convenient  .

## 2018-06-02 NOTE — Progress Notes (Signed)
Chief Complaint  Patient presents with  . Leg Pain    Pain in lft leg for couple of months now  . Extremity Weakness    weakness and painful right arm    HPI: Kaylee Adams 67 y.o. come in for  ?   Chronic disease management   Fu lipids   Last advice to get  Fu lipid panel and then rov but  No labs done   Sometimes sore left leg  Lateral and to  Leg at times .   And ocass leg gives way but didn't fall.  ocass sore   Twice recnetly .      And right arm sores .     Not sure if from lifting  [place] of water.   From elbow up.    recently over last   About a week .  ROS: See pertinent positives and negatives per HPI. No fever systemic sx or redness or warmth of joint   Past Medical History:  Diagnosis Date  . CAD (coronary artery disease)    a. 11/2005 Cath: nonobs dzs, 30% LAD lesion on cath ;  b. 11/2005 Echo: NL EF;  c. 04/2012 Echo: EF 55-60%, Gr 2 DD;  d. 04/2012 Ex MV: EF 72%, ST depression in recovery with NL perfusion imaging - HTN response to exercise.  . Hepatic cyst    on ct [place]  . HLD (hyperlipidemia)   . HTN (hypertension)   . Midsternal chest pain    cardiology eval with non-ischemic stress test 04/2012    Family History  Problem Relation Age of Onset  . Breast cancer Sister   . Thyroid disease Sister   . Hypertension Mother     Social History   Socioeconomic History  . Marital status: Married    Spouse name: Not on file  . Number of children: 5  . Years of education: Not on file  . Highest education level: Not on file  Occupational History    Employer: [employer]  . Financial resource strain: Not on file  . Food insecurity:    Worry: Not on file    Inability: Not on file  . Transportation needs:    Medical: Not on file    Non-medical: Not on file  Tobacco Use  . Smoking status: Never Smoker  . Smokeless tobacco: Never Used  Substance and Sexual Activity  . Alcohol use: No  . Drug use: No  . Sexual activity: Not on file    Lifestyle  . Physical activity:    Days per week: Not on file    Minutes per session: Not on file  . Stress: Not on file  Relationships  . Social connections:    Talks on phone: Not on file    Gets together: Not on file    Attends religious service: Not on file    Active member of club or organization: Not on file    Attends meetings of clubs or organizations: Not on file    Relationship status: Not on file  Other Topics Concern  . Not on file  Social History Narrative   Married, full time Network engineer. 3 pets    [place]   6-8 hours sleep     Outpatient Medications Prior to Visit  Medication Sig Dispense Refill  . amLODipine (NORVASC) 10 MG tablet Take 1 tablet (10 mg total) by mouth daily. 90 tablet 3  . cyclobenzaprine (FLEXERIL) 10 MG tablet Take 1  tablet (10 mg total) by mouth at bedtime. (Patient not taking: Reported on 06/02/2018) 15 tablet 0  . losartan-hydrochlorothiazide (HYZAAR) 100-25 MG tablet Take 1 tablet by mouth daily. 90 tablet 3   No facility-administered medications prior to visit.      EXAM:  BP (!) 142/80 (BP Location: Left Arm, Patient Position: Sitting, Cuff Size: Normal)   Pulse 80   Temp 98.4 F (36.9 C) (Oral)   Wt 161 lb 6.4 oz (73.2 kg)   SpO2 95%   BMI 32.05 kg/m   Body mass index is 32.05 kg/m.  GENERAL: vitals reviewed and listed above, alert, oriented, appears well hydrated and in no acute distress HEENT: atraumatic, conjunctiva  clear, no obvious abnormalities on inspection of external nose and ears NECK: no obvious masses on inspection palpation  LUNGS: clear to auscultation bilaterally, no wheezes, rales or rhonchi, good air movement CV: HRRR, no clubbing cyanosis or  peripheral edema nl cap refill  MS: moves all extremities without noticeable focal  Abnormality limping at times but walks steady favors left leg   Right arm nl rom x shoulder elevatetender at  Lateral elbow  no weakness and no swelling  nv seems ok  Mild left lb  pain  Neg slr and  No gross defect  PSYCH: pleasant and cooperative, no obvious depression or anxiety Lab Results  Component Value Date   WBC 5.6 07/05/2017   HGB 13.0 07/05/2017   HCT 39.0 07/05/2017   PLT 287.0 07/05/2017   GLUCOSE 97 02/07/2018   CHOL 237 (H) 02/07/2018   TRIG 74.0 02/07/2018   HDL 50.10 02/07/2018   LDLDIRECT 153.2 06/23/2012   LDLCALC 172 (H) 02/07/2018   ALT 17 02/07/2018   AST 16 02/07/2018   NA 141 02/07/2018   K 3.8 02/07/2018   CL 104 02/07/2018   CREATININE 0.81 02/07/2018   BUN 15 02/07/2018   CO2 30 02/07/2018   TSH 0.72 10/18/2017   INR 0.9 11/29/2008   HGBA1C 5.7 02/12/2008   BP Readings from Last 3 Encounters:  06/02/18 (!) 142/80  02/07/18 124/78  09/30/17 (!) 154/66     ASSESSMENT AND PLAN:  Discussed the following assessment and plan:  Pain of left lower extremity - with giving out but no spec waekness othjerwise  back gets sore but not  classic radicular sx  and hio seems ok exam good today close observ nsaid and refer if   Hyperlipidemia, unspecified hyperlipidemia type - prefers no meds   wil work on lsi and  recheck lipids  - Plan: Lipid panel  Right arm pain - seems like tendinitis  epicondylitis  Option to refer out for more evaluation but wants to wait  And rx conservative for now  Will elt [place] know  This has been fully explained to the patient, who indicates understanding. Total visit 56mins > 50% spent counseling and coordinating care as indicated in above note and in instructions to patient .   -Patient advised to return or notify health care team  if  new concerns arise.  Patient Instructions  Try  Ibuprofen  400 - 600 mg  Every  8- 12 hours for  7-10 days with stomach precautions .     And   relative rest  Of arm elbow and ice at end of day  I think this is overuse  .    Tendinitis type of  Problem .   Exam is reassuring today but could be  A pinched nerve in back  That could be causing the problem with the leg  giving out.    If recurring leg giving out or weakness   Or ongoing then contact us and advise a referral orthopedics .   Get a fasting lipid panel when convenient  .    Standley Brooking. Jacquilyn Seldon M.D.

## 2018-06-22 ENCOUNTER — Encounter: Payer: Self-pay | Admitting: Internal Medicine

## 2018-07-17 ENCOUNTER — Other Ambulatory Visit: Payer: Medicare Other

## 2018-10-15 ENCOUNTER — Encounter: Payer: Self-pay | Admitting: Adult Health

## 2018-10-15 ENCOUNTER — Ambulatory Visit (INDEPENDENT_AMBULATORY_CARE_PROVIDER_SITE_OTHER): Payer: Medicare Other | Admitting: Adult Health

## 2018-10-15 VITALS — BP 132/64 | Temp 98.3°F | Wt 163.0 lb

## 2018-10-15 DIAGNOSIS — J40 Bronchitis, not specified as acute or chronic: Secondary | ICD-10-CM | POA: Diagnosis not present

## 2018-10-15 MED ORDER — BENZONATATE 100 MG PO CAPS: 100.0000 mg | ORAL_CAPSULE | Freq: Two times a day (BID) | ORAL | 0 refills | Status: DC | PRN

## 2018-10-15 MED ORDER — PREDNISONE 10 MG PO TABS: ORAL_TABLET | ORAL | 0 refills | Status: DC

## 2018-10-15 NOTE — Progress Notes (Signed)
Subjective:    Patient ID: Kaylee Adams, female    DOB: November 20, 1951, 67 y.o.   MRN: [id number]  Cough  This is a new problem. The current episode started 1 to 4 weeks ago (3 weeks). The problem has been unchanged. The problem occurs every few minutes. The cough is non-productive. Associated symptoms include ear congestion, rhinorrhea (clear) and wheezing. Pertinent negatives include no chills, ear pain, fever, headaches, nasal congestion, postnasal drip, sore throat or shortness of breath. Nothing aggravates the symptoms. She has tried nothing for the symptoms. There is no history of asthma, bronchiectasis, bronchitis, COPD, emphysema or pneumonia.      Review of Systems  Constitutional: Negative for activity change, chills and fever.  HENT: Positive for rhinorrhea (clear). Negative for ear pain, postnasal drip, sinus pressure, sinus pain, sore throat and trouble swallowing.   Respiratory: Positive for cough, chest tightness and wheezing. Negative for shortness of breath.   Cardiovascular: Negative.   Gastrointestinal: Negative.   Neurological: Negative for headaches.  All other systems reviewed and are negative.  Past Medical History:  Diagnosis Date  . CAD (coronary artery disease)    a. 11/2005 Cath: nonobs dzs, 30% LAD lesion on cath ;  b. 11/2005 Echo: NL EF;  c. 04/2012 Echo: EF 55-60%, Gr 2 DD;  d. 04/2012 Ex MV: EF 72%, ST depression in recovery with NL perfusion imaging - HTN response to exercise.  . Hepatic cyst    on ct [place]  . HLD (hyperlipidemia)   . HTN (hypertension)   . Midsternal chest pain    cardiology eval with non-ischemic stress test 04/2012    Social History   Socioeconomic History  . Marital status: Married    Spouse name: Not on file  . Number of children: 5  . Years of education: Not on file  . Highest education level: Not on file  Occupational History    Employer: [employer]  . Financial resource strain: Not on file  . Food  insecurity:    Worry: Not on file    Inability: Not on file  . Transportation needs:    Medical: Not on file    Non-medical: Not on file  Tobacco Use  . Smoking status: Never Smoker  . Smokeless tobacco: Never Used  Substance and Sexual Activity  . Alcohol use: No  . Drug use: No  . Sexual activity: Not on file  Lifestyle  . Physical activity:    Days per week: Not on file    Minutes per session: Not on file  . Stress: Not on file  Relationships  . Social connections:    Talks on phone: Not on file    Gets together: Not on file    Attends religious service: Not on file    Active member of club or organization: Not on file    Attends meetings of clubs or organizations: Not on file    Relationship status: Not on file  . Intimate partner violence:    Fear of current or ex partner: Not on file    Emotionally abused: Not on file    Physically abused: Not on file    Forced sexual activity: Not on file  Other Topics Concern  . Not on file  Social History Narrative   Married, full time Network engineer. 3 pets    [place]   6-8 hours sleep     Past Surgical History:  Procedure Laterality Date  . ABDOMINAL HYSTERECTOMY  fibroid tumors  . BREAST BIOPSY    . child birth x 5    . CHOLECYSTECTOMY    . SHOULDER ARTHROSCOPY WITH ROTATOR CUFF REPAIR AND SUBACROMIAL DECOMPRESSION Right 11/03/2013   Procedure: RIGHT SHOULDER ARTHROSCOPY WITH DEBRIDEMENT, ROTATOR CUFF REPAIR AND SUBACROMIAL DECOMPRESSION, DISTAL CLAVICLE RESECTION;  Surgeon: Mcarthur Rossetti, MD;  Location: [facility];  Service: Orthopedics;  Laterality: Right;  Marland Kitchen VESICOVAGINAL FISTULA CLOSURE W/ TAH     for fibroid tumors    Family History  Problem Relation Age of Onset  . Breast cancer Sister   . Thyroid disease Sister   . Hypertension Mother     Allergies  Allergen Reactions  . Lisinopril     REACTION: cough  . Penicillins Nausea Only    Current Outpatient Medications on File Prior to Visit    Medication Sig Dispense Refill  . amLODipine (NORVASC) 10 MG tablet Take 1 tablet (10 mg total) by mouth daily. 90 tablet 3  . losartan-hydrochlorothiazide (HYZAAR) 100-25 MG tablet Take 1 tablet by mouth daily. 90 tablet 3  . cyclobenzaprine (FLEXERIL) 10 MG tablet Take 1 tablet (10 mg total) by mouth at bedtime. (Patient not taking: Reported on 06/02/2018) 15 tablet 0   No current facility-administered medications on file prior to visit.     BP 132/64   Temp 98.3 F (36.8 C)   Wt 163 lb (73.9 kg)   BMI 32.37 kg/m       Objective:   Physical Exam  Constitutional: She is oriented to person, place, and time. She appears well-developed and well-nourished. No distress.  HENT:  Head: Normocephalic and atraumatic.  Right Ear: External ear normal.  Left Ear: External ear normal.  Nose: Nose normal.  Mouth/Throat: Oropharynx is clear and moist.  Eyes: Pupils are equal, round, and reactive to light. Conjunctivae and EOM are normal. Right eye exhibits no discharge. Left eye exhibits no discharge.  Cardiovascular: Normal rate, regular rhythm, normal heart sounds and intact distal pulses.  Pulmonary/Chest: Effort normal. No stridor. No respiratory distress. She has wheezes (trace expiratory throughout ). She has no rales. She exhibits no tenderness.  Neurological: She is alert and oriented to person, place, and time.  Skin: Skin is warm and dry. She is not diaphoretic.  Psychiatric: She has a normal mood and affect. Her behavior is normal. Judgment and thought content normal.  Nursing note and vitals reviewed.     Assessment & Plan:  1. Bronchitis - predniSONE (DELTASONE) 10 MG tablet; 40 mg x 3 days, 20 mg x 3 days, 10 mg x 3 days  Dispense: 21 tablet; Refill: 0 - benzonatate (TESSALON) 100 MG capsule; Take 1 capsule (100 mg total) by mouth 2 (two) times daily as needed for cough.  Dispense: 20 capsule; Refill: 0 - Follow up if no improvement in the next 2-3 days   Dorothyann Peng,  NP

## 2018-10-23 ENCOUNTER — Other Ambulatory Visit: Payer: Self-pay | Admitting: Adult Health

## 2018-10-23 ENCOUNTER — Telehealth: Payer: Self-pay | Admitting: Internal Medicine

## 2018-10-23 MED ORDER — DOXYCYCLINE HYCLATE 100 MG PO CAPS: 100.0000 mg | ORAL_CAPSULE | Freq: Two times a day (BID) | ORAL | 0 refills | Status: DC

## 2018-10-23 NOTE — Telephone Encounter (Signed)
Copied from [place] [id number]. Topic: Quick Communication - See Telephone Encounter >> Oct 23, 2018  2:26 PM Antonieta Iba C wrote: CRM for notification. See Telephone encounter for: 10/23/18.  Pt called in to be advised. Pt says that she was seen by Tommi Rumps and prescribed 2 medications. Pt says that she can still feel the congestion in her chest and now her ears feel clogged. Pt would like to know if provider could send in something else to the pharmacy to help clear things up completely?   CB: [id number]  Pharmacy: [id number] [place], [place] [phone] (Phone) [phone] (Fax)

## 2018-10-23 NOTE — Telephone Encounter (Signed)
Doxycycline sent to pharmacy. She will need to follow up with PCP if her symptoms are still not resolved after this abx

## 2018-10-24 NOTE — Telephone Encounter (Signed)
Patient is aware 

## 2018-11-13 ENCOUNTER — Encounter: Payer: Self-pay | Admitting: Internal Medicine

## 2018-11-13 ENCOUNTER — Ambulatory Visit (INDEPENDENT_AMBULATORY_CARE_PROVIDER_SITE_OTHER): Payer: Medicare Other | Admitting: Internal Medicine

## 2018-11-13 ENCOUNTER — Ambulatory Visit (INDEPENDENT_AMBULATORY_CARE_PROVIDER_SITE_OTHER): Payer: Medicare Other

## 2018-11-13 VITALS — BP 118/62 | HR 74 | Temp 98.3°F | Wt 162.5 lb

## 2018-11-13 DIAGNOSIS — R0789 Other chest pain: Secondary | ICD-10-CM

## 2018-11-13 DIAGNOSIS — J40 Bronchitis, not specified as acute or chronic: Secondary | ICD-10-CM | POA: Diagnosis not present

## 2018-11-13 DIAGNOSIS — Z23 Encounter for immunization: Secondary | ICD-10-CM

## 2018-11-13 DIAGNOSIS — G5712 Meralgia paresthetica, left lower limb: Secondary | ICD-10-CM | POA: Diagnosis not present

## 2018-11-13 DIAGNOSIS — M792 Neuralgia and neuritis, unspecified: Secondary | ICD-10-CM | POA: Diagnosis not present

## 2018-11-13 DIAGNOSIS — R05 Cough: Secondary | ICD-10-CM | POA: Diagnosis not present

## 2018-11-13 NOTE — Progress Notes (Signed)
Chief Complaint  Patient presents with  . chest congestion    Continued chest congestion, not much improvement. Some cough, little wheezing and SOB at times. No mucous production.. Denies fever  . Leg Pain    Left leg pain, burning sensation x 1 day, recurring issue, never diagnosed.    HPI:  HESSIE VARONE 67 y.o. come in for sda   See   Has been sick for weeks   Saw CN   tessalone perle's and pred rx     No fever.    On going cough   And a lot better but still feels like upper ant chest  Sx like "somethin in there"  But no fever weight loss NVD    Intermittent left lateral thigh burning pain without weakness or progression   No injury  No falling fever   Dies  Have some hip issues    ROS: See pertinent positives and negatives per HPI. No wheezing  Change in exercise tolerance   Past Medical History:  Diagnosis Date  . CAD (coronary artery disease)    a. 11/2005 Cath: nonobs dzs, 30% LAD lesion on cath ;  b. 11/2005 Echo: NL EF;  c. 04/2012 Echo: EF 55-60%, Gr 2 DD;  d. 04/2012 Ex MV: EF 72%, ST depression in recovery with NL perfusion imaging - HTN response to exercise.  . Hepatic cyst    on ct [place]  . HLD (hyperlipidemia)   . HTN (hypertension)   . Midsternal chest pain    cardiology eval with non-ischemic stress test 04/2012    Family History  Problem Relation Age of Onset  . Breast cancer Sister   . Thyroid disease Sister   . Hypertension Mother     Social History   Socioeconomic History  . Marital status: Married    Spouse name: Not on file  . Number of children: 5  . Years of education: Not on file  . Highest education level: Not on file  Occupational History    Employer: [employer]  . Financial resource strain: Not on file  . Food insecurity:    Worry: Not on file    Inability: Not on file  . Transportation needs:    Medical: Not on file    Non-medical: Not on file  Tobacco Use  . Smoking status: Never Smoker  . Smokeless tobacco:  Never Used  Substance and Sexual Activity  . Alcohol use: No  . Drug use: No  . Sexual activity: Not on file  Lifestyle  . Physical activity:    Days per week: Not on file    Minutes per session: Not on file  . Stress: Not on file  Relationships  . Social connections:    Talks on phone: Not on file    Gets together: Not on file    Attends religious service: Not on file    Active member of club or organization: Not on file    Attends meetings of clubs or organizations: Not on file    Relationship status: Not on file  Other Topics Concern  . Not on file  Social History Narrative   Married, full time Network engineer. 3 pets    [place]   6-8 hours sleep     Outpatient Medications Prior to Visit  Medication Sig Dispense Refill  . amLODipine (NORVASC) 10 MG tablet Take 1 tablet (10 mg total) by mouth daily. 90 tablet 3  . losartan-hydrochlorothiazide (HYZAAR) 100-25 MG  tablet Take 1 tablet by mouth daily. 90 tablet 3  . benzonatate (TESSALON) 100 MG capsule Take 1 capsule (100 mg total) by mouth 2 (two) times daily as needed for cough. (Patient not taking: Reported on 11/13/2018) 20 capsule 0  . cyclobenzaprine (FLEXERIL) 10 MG tablet Take 1 tablet (10 mg total) by mouth at bedtime. (Patient not taking: Reported on 11/13/2018) 15 tablet 0  . doxycycline (VIBRAMYCIN) 100 MG capsule Take 1 capsule (100 mg total) by mouth 2 (two) times daily. (Patient not taking: Reported on 11/13/2018) 14 capsule 0  . predniSONE (DELTASONE) 10 MG tablet 40 mg x 3 days, 20 mg x 3 days, 10 mg x 3 days (Patient not taking: Reported on 11/13/2018) 21 tablet 0   No facility-administered medications prior to visit.      EXAM:  BP 118/62 (BP Location: Left Arm, Patient Position: Sitting, Cuff Size: Normal)   Pulse 74   Temp 98.3 F (36.8 C) (Oral)   Wt 162 lb 8 oz (73.7 kg)   SpO2 96%   BMI 32.27 kg/m   Body mass index is 32.27 kg/m. WDWN in NAD  quiet respirationscongested  somewhat hoarse. Non  toxic . Looks well  HEENT: Normocephalic ;atraumatic , Eyes;  PERRL, EOMs  Full, lids and conjunctiva clear,,Ears: no deformities, canals nl, TM landmarks normal, Nose: no deformity or discharge but congested;face minimally tender Mouth : OP clear without lesion or edema . Neck: Supple without adenopathy or masses or bruits Chest:  Clear to A&P without wheezes rales or rhonchi CV:  S1-S2 no gallops or murmurs peripheral perfusion is normal Skin :nl perfusion and no acute rashes  lle  Nl gait  No skin changes per patient  PSYCH: pleasant and cooperative, no obvious depression or anxiety Lab Results  Component Value Date   WBC 5.6 07/05/2017   HGB 13.0 07/05/2017   HCT 39.0 07/05/2017   PLT 287.0 07/05/2017   GLUCOSE 97 02/07/2018   CHOL 237 (H) 02/07/2018   TRIG 74.0 02/07/2018   HDL 50.10 02/07/2018   LDLDIRECT 153.2 06/23/2012   LDLCALC 172 (H) 02/07/2018   ALT 17 02/07/2018   AST 16 02/07/2018   NA 141 02/07/2018   K 3.8 02/07/2018   CL 104 02/07/2018   CREATININE 0.81 02/07/2018   BUN 15 02/07/2018   CO2 30 02/07/2018   TSH 0.72 10/18/2017   INR 0.9 11/29/2008   HGBA1C 5.7 02/12/2008   BP Readings from Last 3 Encounters:  11/13/18 118/62  10/15/18 132/64  06/02/18 (!) 142/80  c xray NAD   ASSESSMENT AND PLAN:  Discussed the following assessment and plan:  Chest discomfort - Plan: DG Chest 2 View, DG Chest 2 View  Bronchitis - Plan: DG Chest 2 View, DG Chest 2 View  Neuritis  Lateral cutaneous nerve of thigh syndrome, left  Need for influenza vaccination - Plan: Flu vaccine HIGH DOSE PF (Fluzone High dose) Suspect post infectious  Sx   Large airway sx  But exam reassuring get  c xray today  Denies reflux aggravated and  Cough is better   suspect lateral cutaneous nerve irritation   Expectant management.  -Patient advised to return or notify health care team  if  new concerns arise.  Patient Instructions   Xray is reassuring  Normal     This seems like  residual to a chest infection not healed .       This could be  Nerve irritation  And  Avoid tight ness  around groin region   Stretch and cold heat and if  persistent or progressive plan fu with neurology or ortho sports medicine.     Standley Brooking. Panosh M.D.

## 2018-11-13 NOTE — Patient Instructions (Addendum)
  Xray is reassuring  Normal     This seems like residual to a chest infection not healed .       This could be  Nerve irritation  And  Avoid tight ness around groin region   Stretch and cold heat and if  persistent or progressive plan fu with neurology or ortho sports medicine.

## 2018-11-18 ENCOUNTER — Other Ambulatory Visit: Payer: Self-pay | Admitting: Internal Medicine

## 2018-11-18 ENCOUNTER — Ambulatory Visit: Payer: Self-pay

## 2018-11-18 ENCOUNTER — Ambulatory Visit: Payer: Self-pay | Admitting: *Deleted

## 2018-11-18 NOTE — Telephone Encounter (Signed)
Returned call to pt. Stated she would like to know if Dr. Regis Bill will call something else in  Message from Rayann Heman sent at 11/18/2018 2:19 PM EST   Summary: congestion    Pt called and stated that she does not want to come in for appointment scheduled for 11/19/18. Pt states that she would rather have something called in. She would like to know what she should do. See triage note from today

## 2018-11-18 NOTE — Telephone Encounter (Signed)
Pt called with still feeling congestion in her chest. She said that her congestions has been going on since the end of September. She has been seen in the office twice and given medication. But nothing is helping to get rid of the congestion. Denies shortness of breath but when she breathes it feels like something is there, not getting enough air in. She feels uncomfortable. Denies fever. When she coughs she gets very little if any secretions. She is drinking lots of fluids and had taken a cough med. Recommend she try mucinex with lots of fluids. She is requesting an appointment. Appointment scheduled. Requesting a med to help break up the congestion and would like to speak with her provider. Will route to flow at [facility]. No protocol.

## 2018-11-18 NOTE — Telephone Encounter (Signed)
  Reason for Disposition . [1] Caller requesting NON-URGENT health information AND [2] PCP's office is the best resource  Protocols used: INFORMATION ONLY CALL-A-AH  

## 2018-11-18 NOTE — Telephone Encounter (Signed)
Pt aware of recommendations.  Per Dr Regis Bill Cancelled appt for 11/19/18 Nothing further needed.

## 2018-11-18 NOTE — Telephone Encounter (Signed)
I would add plain mucin ex and also  otc Prilosec once a day for 10 -- 14 days  in case any  Upper silent reflux could  be contributing to the upper chest tightness Let us know next week  How this is doing

## 2018-11-18 NOTE — Telephone Encounter (Signed)
Will send to Dr Regis Bill as Juluis Rainier Nothing further needed.

## 2018-11-18 NOTE — Telephone Encounter (Signed)
Continued from previous entry at 3:03 PM; pt. Stated she would like to know if there is anything that Dr. Regis Bill can call in for her congestion.  Denied SOB or chest tightness. Asking for something to break up the congestion.  Is willing to try Mucinex, but asking if Dr. Regis Bill has any other recommendations.  Reported she would prefer not to come in to the office tomorrow.  Will send message to office.

## 2018-11-19 ENCOUNTER — Ambulatory Visit: Payer: Medicare Other | Admitting: Internal Medicine

## 2018-11-19 NOTE — Telephone Encounter (Signed)
I dont see this on her med list   So denied  Please advise  .

## 2018-11-19 NOTE — Telephone Encounter (Signed)
Please advise Dr Panosh, thanks.   

## 2018-11-25 ENCOUNTER — Telehealth: Payer: Self-pay | Admitting: Internal Medicine

## 2018-11-25 MED ORDER — HYDROCHLOROTHIAZIDE 25 MG PO TABS: 25.0000 mg | ORAL_TABLET | Freq: Every day | ORAL | 3 refills | Status: DC

## 2018-11-25 MED ORDER — LOSARTAN POTASSIUM 100 MG PO TABS: 100.0000 mg | ORAL_TABLET | Freq: Every day | ORAL | 3 refills | Status: DC

## 2018-11-25 NOTE — Telephone Encounter (Signed)
Sub Rx sent in to pharmacy

## 2018-11-25 NOTE — Telephone Encounter (Signed)
Copied from [place] [id number]. Topic: Quick Communication - See Telephone Encounter >> Nov 25, 2018 10:01 AM Conception Chancy, NT wrote: CRM for notification. See Telephone encounter for: 11/25/18.  Patient is calling and states her pharmacy is not able to get losartan-hydrochlorothiazide (HYZAAR) 100-25 MG tablet. She thinks she is needing something else called in place of this. She would like to know Dr. Regis Bill recommendations   [id number] - [place], [place] [address] [place] [address] Phone: [phone] Fax: [phone]

## 2018-11-26 NOTE — Telephone Encounter (Signed)
This ws I n my box   Do I need to do anthing further?

## 2019-02-06 NOTE — Progress Notes (Signed)
Chief Complaint  Patient presents with  . Annual Exam    pt would like to talk about coming off blood pressure medicine   . Medication Management  . Hyperlipidemia    HPI: Kaylee Adams 68 y.o. comes in today for Chronic disease management and update      BP: She states her blood pressure has been very good and at goal would like to decrease her blood pressure medicines or get off of some of them.  She would like to go off the losartan thinks it could be related to left hip pain at times.  No significant cramping has been exercising 3 to 4 days at the gym with a stretching machine and treadmill 35 to 40 minutes.  No chest pain shortness of breath or concerns makes her feel better.  Thyroid would like thyroid monitored has been evaluated by Dr. Michiel Sites in the past some mild goiter monitoring functions. GI stable due for colonoscopy brother had colon cancer she believes. No history of fractures not had a bone density.    Health Maintenance  Topic Date Due  . DEXA SCAN  01/10/2017  . COLONOSCOPY  12/30/2018  . MAMMOGRAM  01/08/2020  . TETANUS/TDAP  02/01/2027  . INFLUENZA VACCINE  Completed  . Hepatitis C Screening  Completed  . PNA vac Low Risk Adult  Completed   Health Maintenance Review LIFESTYLE:  Exercise:  [place] 3d poer week 30 - 45 min Tobacco/ETS:n Alcohol: n Sugar beverages: Sugar and coffee Sleep: At least 7 Drug use: no HH: 5 husband grandkids sister no pets Retired    Occupational psychologist: ok  Vision:  No limitations at present . Last eye check UTD  Safety:  Has smoke detector and wears seat belts. s. No excess sun exposure. Sees dentist regularly.  Memory: Felt to be good  , no concern from her or her family.  Depression: No anhedonia unusual crying or depressive symptoms  Nutrition: Eats l balanced diet; adequate calcium and vitamin D. No swallowing chewing problems.  Injury: no major injuries in the last six months.  Other healthcare providers:   Reviewed today .   Preventive parameters:   Reviewed   ADLS:   There are no problems or need for assistance  driving, feeding, obtaining food, dressing, toileting and bathing, managing money using phone. She is independent.    ROS:  GEN/ HEENT: No fever, significant weight changes sweats headaches vision problems hearing changes, CV/ PULM; No chest pain shortness of breath cough, syncope,edema  change in exercise tolerance. GI /GU: No adominal pain, vomiting, change in bowel habits. No blood in the stool. No significant GU symptoms. SKIN/HEME: ,no acute skin rashes suspicious lesions or bleeding. No lymphadenopathy, nodules, masses.  NEURO/ PSYCH:  No neurologic signs such as weakness numbness. No depression anxiety. IMM/ Allergy: No unusual infections.  Allergy .   REST of 12 system review negative except as per HPI   Past Medical History:  Diagnosis Date  . CAD (coronary artery disease)    a. 11/2005 Cath: nonobs dzs, 30% LAD lesion on cath ;  b. 11/2005 Echo: NL EF;  c. 04/2012 Echo: EF 55-60%, Gr 2 DD;  d. 04/2012 Ex MV: EF 72%, ST depression in recovery with NL perfusion imaging - HTN response to exercise.  . Chest pressure "fatigue" 05/08/2012  . Hepatic cyst    on ct [place]  . HLD (hyperlipidemia)   . HTN (hypertension)   . Midsternal chest pain    cardiology  eval with non-ischemic stress test 04/2012  . Upper airway cough syndrome 04/29/2015   Trial of max gerd rx 04/29/2015 >>>      Family History  Problem Relation Age of Onset  . Breast cancer Sister   . Thyroid disease Sister   . Hypertension Mother     Social History   Socioeconomic History  . Marital status: Married    Spouse name: Not on file  . Number of children: 5  . Years of education: Not on file  . Highest education level: Not on file  Occupational History    Employer: [employer]  . Financial resource strain: Not on file  . Food insecurity:    Worry: Not on file    Inability: Not on  file  . Transportation needs:    Medical: Not on file    Non-medical: Not on file  Tobacco Use  . Smoking status: Never Smoker  . Smokeless tobacco: Never Used  Substance and Sexual Activity  . Alcohol use: No  . Drug use: No  . Sexual activity: Not on file  Lifestyle  . Physical activity:    Days per week: Not on file    Minutes per session: Not on file  . Stress: Not on file  Relationships  . Social connections:    Talks on phone: Not on file    Gets together: Not on file    Attends religious service: Not on file    Active member of club or organization: Not on file    Attends meetings of clubs or organizations: Not on file    Relationship status: Not on file  Other Topics Concern  . Not on file  Social History Narrative   Married, full time Network engineer. 3 pets    [place]   6-8 hours sleep     Outpatient Encounter Medications as of 02/10/2019  Medication Sig  . amLODipine (NORVASC) 10 MG tablet Take 1 tablet (10 mg total) by mouth daily.  . hydrochlorothiazide (HYDRODIURIL) 25 MG tablet Take 1 tablet (25 mg total) by mouth daily.  Marland Kitchen losartan (COZAAR) 100 MG tablet Take 1 tablet (100 mg total) by mouth daily.  Marland Kitchen losartan-hydrochlorothiazide (HYZAAR) 100-25 MG tablet Take 1 tablet by mouth daily.   No facility-administered encounter medications on file as of 02/10/2019.     EXAM:  BP 138/74 (BP Location: Right Arm, Patient Position: Sitting, Cuff Size: Normal)   Pulse 75   Temp 98.3 F (36.8 C) (Oral)   Ht 5\' 1"  (1.549 m)   Wt 159 lb (72.1 kg)   BMI 30.04 kg/m   Body mass index is 30.04 kg/m.  Physical Exam: Vital signs reviewed ENI:DPOE is a well-developed well-nourished alert cooperative   who appears stated age in no acute distress.  HEENT: normocephalic atraumatic , Eyes: PERRL EOM's full, conjunctiva clear, Nares: paten,t no deformity discharge or tenderness., Ears: no deformity EAC's clear TMs with normal landmarks. Mouth: clear OP, no lesions, edema.   Moist mucous membranes. Dentition in adequate repair. NECK: supple without masses, but thyroid palpable  or bruits. CHEST/PULM:  Clear to auscultation and percussion breath sounds equal no wheeze , rales or rhonchi. Breast: normal by inspection . No dimpling, discharge, masses, tenderness or discharge . Healed bxc scar  CV: PMI is nondisplaced, S1 S2 no gallops, murmurs, rubs. Peripheral pulses are without ABDOMEN: Bowel sounds normal nontender  No guard or rebound, no hepato splenomegal no CVA tenderness.   Extremtities:  No  clubbing cyanosis or edema, no acute joint swelling or redness no focal atrophy NEURO:  Oriented x3, cranial nerves 3-12 appear to be intact, no obvious focal weakness,gait within normal limits SKIN: No acute rashes normal turgor, color, no bruising or petechiae. PSYCH: Oriented, good eye contact, no obvious depression anxiety, cognition and judgment appear normal. LN: no cervical axillary adenopathy No noted deficits in memory, attention, and speech.   Lab Results  Component Value Date   WBC 5.6 07/05/2017   HGB 13.0 07/05/2017   HCT 39.0 07/05/2017   PLT 287.0 07/05/2017   GLUCOSE 97 02/07/2018   CHOL 237 (H) 02/07/2018   TRIG 74.0 02/07/2018   HDL 50.10 02/07/2018   LDLDIRECT 153.2 06/23/2012   LDLCALC 172 (H) 02/07/2018   ALT 17 02/07/2018   AST 16 02/07/2018   NA 141 02/07/2018   K 3.8 02/07/2018   CL 104 02/07/2018   CREATININE 0.81 02/07/2018   BUN 15 02/07/2018   CO2 30 02/07/2018   TSH 0.72 10/18/2017   INR 0.9 11/29/2008   HGBA1C 5.7 02/12/2008   Wt Readings from Last 3 Encounters:  02/10/19 159 lb (72.1 kg)  11/13/18 162 lb 8 oz (73.7 kg)  10/15/18 163 lb (73.9 kg)   BP Readings from Last 3 Encounters:  02/10/19 138/74  11/13/18 118/62  10/15/18 132/64    ASSESSMENT AND PLAN:  Discussed the following assessment and plan:  Essential hypertension - Plan: Basic metabolic panel, CBC with Differential/Platelet, Hepatic function panel,  Lipid panel, TSH, T4, free  Medication management - Plan: Basic metabolic panel, CBC with Differential/Platelet, Hepatic function panel, Lipid panel, TSH, T4, free  Hyperlipidemia, unspecified hyperlipidemia type - Plan: Basic metabolic panel, CBC with Differential/Platelet, Hepatic function panel, Lipid panel, TSH, T4, free  Goiter ? right  - Plan: Basic metabolic panel, CBC with Differential/Platelet, Hepatic function panel, Lipid panel, TSH, T4, free  Estrogen deficiency - Plan: DG Bone Density, Basic metabolic panel, CBC with Differential/Platelet, Hepatic function panel, Lipid panel, TSH, T4, free dexa  And colon Patient wants to get off of some of her blood pressure medicines discussed if she is at goal that is reasonable she wants to pick the losartan but I reassured her that I do not think it is causing her hip pain when she gets it. There are extra benefits of an ACE receptor blocker on the heart blood vessels and kidneys.  Patient is aware. She can try going down to 50 mg a day after a month check some readings if in range contact us for next step can send in 50 mg tablets at that point.  Continue your lifestyle interventions. Monitor thyroid today interventions as appropriate. She is doing quite well since she retired. Yearly preventive parameters discussed. Patient Care Team: Burnis Medin, MD as PCP - General Milus Banister, MD (Gastroenterology) Fay Records, MD (Cardiology)  Patient Instructions  Glad you are doing well  Decrease the losartan to 50 mg per day  For a month  And if bp at goal 120/80 range can contact us for  Lower dose medication 50 mg to wean down  .    Continue lifestyle intervention healthy eating and exercise .   Make appt for dexa bone density  Get updated  Colon cancer screening  Shingrix vaccine at your pharmacy        [place]. Jenavieve Freda M.D.

## 2019-02-10 ENCOUNTER — Ambulatory Visit (INDEPENDENT_AMBULATORY_CARE_PROVIDER_SITE_OTHER): Payer: Medicare Other | Admitting: Internal Medicine

## 2019-02-10 ENCOUNTER — Encounter: Payer: Self-pay | Admitting: Internal Medicine

## 2019-02-10 VITALS — BP 138/74 | HR 75 | Temp 98.3°F | Ht 61.0 in | Wt 159.0 lb

## 2019-02-10 DIAGNOSIS — E2839 Other primary ovarian failure: Secondary | ICD-10-CM

## 2019-02-10 DIAGNOSIS — I10 Essential (primary) hypertension: Secondary | ICD-10-CM | POA: Diagnosis not present

## 2019-02-10 DIAGNOSIS — E049 Nontoxic goiter, unspecified: Secondary | ICD-10-CM

## 2019-02-10 DIAGNOSIS — Z79899 Other long term (current) drug therapy: Secondary | ICD-10-CM

## 2019-02-10 DIAGNOSIS — E785 Hyperlipidemia, unspecified: Secondary | ICD-10-CM | POA: Diagnosis not present

## 2019-02-10 LAB — BASIC METABOLIC PANEL
BUN: 14 mg/dL (ref 6–23)
CO2: 31 mEq/L (ref 19–32)
Calcium: 10.6 mg/dL — ABNORMAL HIGH (ref 8.4–10.5)
Chloride: 104 mEq/L (ref 96–112)
Creatinine, Ser: 0.8 mg/dL (ref 0.40–1.20)
GFR: 86.55 mL/min (ref >60.00)
Glucose, Bld: 91 mg/dL (ref 70–99)
Potassium: 4.2 mEq/L (ref 3.5–5.1)
Sodium: 141 mEq/L (ref 135–145)

## 2019-02-10 LAB — CBC WITH DIFFERENTIAL/PLATELET
Basophils Absolute: 0 10*3/uL (ref 0.0–0.1)
Basophils Relative: 0.1 % (ref 0.0–3.0)
EOS ABS: 0 10*3/uL (ref 0.0–0.7)
Eosinophils Relative: 0 % (ref 0.0–5.0)
HCT: 40.2 % (ref 36.0–46.0)
Hemoglobin: 13.2 g/dL (ref 12.0–15.0)
Lymphocytes Relative: 42.9 % (ref 12.0–46.0)
Lymphs Abs: 2.1 10*3/uL (ref 0.7–4.0)
MCHC: 32.8 g/dL (ref 30.0–36.0)
MCV: 78.6 fl (ref 78.0–100.0)
Monocytes Absolute: 0.4 10*3/uL (ref 0.1–1.0)
Monocytes Relative: 8 % (ref 3.0–12.0)
Neutro Abs: 2.4 10*3/uL (ref 1.4–7.7)
Neutrophils Relative %: 49 % (ref 43.0–77.0)
Platelets: 320 10*3/uL (ref 150.0–400.0)
RBC: 5.11 Mil/uL (ref 3.87–5.11)
RDW: 13.9 % (ref 11.5–15.5)
WBC: 4.8 10*3/uL (ref 4.0–10.5)

## 2019-02-10 LAB — HEPATIC FUNCTION PANEL
ALT: 18 U/L (ref 0–35)
AST: 17 U/L (ref 0–37)
Albumin: 4 g/dL (ref 3.5–5.2)
Alkaline Phosphatase: 51 U/L (ref 39–117)
BILIRUBIN TOTAL: 0.5 mg/dL (ref 0.2–1.2)
Bilirubin, Direct: 0.1 mg/dL (ref 0.0–0.3)
Total Protein: 6.6 g/dL (ref 6.0–8.3)

## 2019-02-10 LAB — LIPID PANEL
Cholesterol: 244 mg/dL — ABNORMAL HIGH (ref 0–200)
HDL: 58.6 mg/dL (ref >39.00)
LDL Cholesterol: 164 mg/dL — ABNORMAL HIGH (ref 0–99)
NONHDL: 185.5
Total CHOL/HDL Ratio: 4
Triglycerides: 108 mg/dL (ref 0.0–149.0)
VLDL: 21.6 mg/dL (ref 0.0–40.0)

## 2019-02-10 LAB — T4, FREE: Free T4: 0.8 ng/dL (ref 0.60–1.60)

## 2019-02-10 LAB — TSH: TSH: 0.72 u[IU]/mL (ref 0.35–4.50)

## 2019-02-10 NOTE — Patient Instructions (Addendum)
Glad you are doing well  Decrease the losartan to 50 mg per day  For a month  And if bp at goal 120/80 range can contact us for  Lower dose medication 50 mg to wean down  .    Continue lifestyle intervention healthy eating and exercise .   Make appt for dexa bone density  Get updated  Colon cancer screening  Shingrix vaccine at your pharmacy

## 2019-02-12 ENCOUNTER — Other Ambulatory Visit: Payer: Self-pay | Admitting: Internal Medicine

## 2019-02-18 ENCOUNTER — Other Ambulatory Visit: Payer: Self-pay

## 2019-02-18 ENCOUNTER — Telehealth: Payer: Self-pay | Admitting: *Deleted

## 2019-02-18 NOTE — Telephone Encounter (Signed)
Please do this referral   For colonscopy  To dr Fuller Plan as requested   For her colon scnreen

## 2019-02-18 NOTE — Telephone Encounter (Signed)
Copied from [place] [phone]. Topic: General - Other >> Feb 18, 2019  9:49 AM Valla Leaver wrote: Reason for CRM: Patient would like to be seen by Dr. Lucio Edward for colonoscopy instead and would like Dr. Regis Bill to send the order to his office. He is at [place]. Please call to notify once order is placed. This was discussed at [place] visit.

## 2019-02-19 ENCOUNTER — Other Ambulatory Visit: Payer: Self-pay

## 2019-02-19 DIAGNOSIS — Z1211 Encounter for screening for malignant neoplasm of colon: Secondary | ICD-10-CM

## 2019-02-19 NOTE — Telephone Encounter (Signed)
Pt has been notified that referral has been placed to Dr.stark

## 2019-03-04 ENCOUNTER — Telehealth: Payer: Self-pay | Admitting: Gastroenterology

## 2019-03-04 NOTE — Telephone Encounter (Signed)
That is okay with me if it is okay with Dr. Fuller Plan

## 2019-03-04 NOTE — Telephone Encounter (Signed)
Dr Stark is this ok? 

## 2019-03-08 NOTE — Telephone Encounter (Signed)
OK 

## 2019-03-09 NOTE — Telephone Encounter (Signed)
Please send to [facility] to schedule.

## 2019-03-09 NOTE — Telephone Encounter (Signed)
Kaylee Adams I would normally send this to [facility] to schedule but I don't think she is here. Sorry

## 2019-03-19 NOTE — Telephone Encounter (Signed)
Called and LM on vmail to call back to schedule 

## 2019-07-17 ENCOUNTER — Telehealth: Payer: Self-pay | Admitting: Internal Medicine

## 2019-07-17 ENCOUNTER — Other Ambulatory Visit: Payer: Self-pay

## 2019-07-17 ENCOUNTER — Other Ambulatory Visit (INDEPENDENT_AMBULATORY_CARE_PROVIDER_SITE_OTHER): Payer: Medicare Other

## 2019-07-17 LAB — BASIC METABOLIC PANEL
BUN: 16 mg/dL (ref 6–23)
CO2: 29 mEq/L (ref 19–32)
Calcium: 10.2 mg/dL (ref 8.4–10.5)
Chloride: 105 mEq/L (ref 96–112)
Creatinine, Ser: 0.89 mg/dL (ref 0.40–1.20)
GFR: 76.43 mL/min (ref >60.00)
Glucose, Bld: 87 mg/dL (ref 70–99)
Potassium: 3.2 mEq/L — ABNORMAL LOW (ref 3.5–5.1)
Sodium: 140 mEq/L (ref 135–145)

## 2019-07-17 NOTE — Telephone Encounter (Addendum)
Patient is requesting to see Dr Silvio Pate instead of Dr Edison Nasuti to discuss getting her colonoscopy.  She states according to that office she would need that from the Dr. Patient will need a call back.  She states that Dr Silvio Pate office acted like they were unaware of the request when she called there.

## 2019-07-21 NOTE — Telephone Encounter (Signed)
Please advise 

## 2019-07-21 NOTE — Telephone Encounter (Signed)
Not sure how to answer   patient to be able to request  Dr Fuller Plan  And dr Fuller Plan approved the  Request   ( see note encounters ifrom  3 11 20  )  Please   Proceed with   Colon  with dr Fuller Plan.

## 2019-07-22 NOTE — Telephone Encounter (Signed)
Called the patient to inform that Dr Silvio Pate is not accepting new pt and they will assign her a MD . Pt states she is fine with it, and she was just waiting on Dr Regis Bill nurse to call her back to give her the message of what Dr Regis Bill advise

## 2019-07-24 ENCOUNTER — Other Ambulatory Visit: Payer: Self-pay

## 2019-07-24 DIAGNOSIS — E876 Hypokalemia: Secondary | ICD-10-CM

## 2019-07-24 NOTE — Telephone Encounter (Signed)
If  Sx go away   Should not be concern but you may be sleeping in a position that presses on a nerve in arm hand . Try repositioning   Your potassium was slgihtly low  But calcium was much better!.   This is probably from the  Fluid pill  Diuretic effect in your BP medication.  I advise a high potassium diet   (Lots of fruits and vegges)  And then recheck    Bmp again in another  Month    If continuing we can add a pill of potassium   .

## 2019-07-24 NOTE — Telephone Encounter (Signed)
   Your potassium was slgihtly low  But calcium was much better!.   This is probably from the  Fluid pill  Diuretic effect in your BP medication.  I advise a high potassium diet   (Lots of fruits and vegges)  And then recheck    Bmp again in another  Month    If continuing we can add a pill of potassium   .

## 2019-07-28 ENCOUNTER — Encounter: Payer: Self-pay | Admitting: Gastroenterology

## 2019-07-28 ENCOUNTER — Telehealth: Payer: Self-pay | Admitting: Internal Medicine

## 2019-07-28 NOTE — Telephone Encounter (Signed)
I called pt to inform her that she would have to call [facility] in order to switch providers the patient said she will give them a call provided her with the phone number   Copied from [place] [phone]. Topic: Referral - Status >> Jul 15, 2019  9:01 AM Reyne Dumas L wrote: Reason for CRM:  Pt states when she last spoke with Dr. Regis Bill she told her that she was ready to have her colonoscopy and she wanted to switch from Dr. Ardis Hughs to Dr. Kennedy Bucker.  Pt states that Dr. Regis Bill told her she would work on this and call her back, but she has never received a call regarding this.  Pt just wants a status update. Pt can be reached at [phone]. >> Jul 28, 2019 11:30 AM Carney Bern wrote: Sent back to [facility] to switch

## 2019-07-29 DIAGNOSIS — E2839 Other primary ovarian failure: Secondary | ICD-10-CM

## 2019-07-29 NOTE — Telephone Encounter (Signed)
So we can order a dexa at the [place] office building  [place] please  help arrange appts  I have placed  order  Dx estrogen deficincy

## 2019-08-03 ENCOUNTER — Ambulatory Visit (INDEPENDENT_AMBULATORY_CARE_PROVIDER_SITE_OTHER)
Admission: RE | Admit: 2019-08-03 | Discharge: 2019-08-03 | Disposition: A | Discharge status: Home / Self Care | Payer: Medicare Other | Source: Ambulatory Visit | Attending: Internal Medicine | Admitting: Internal Medicine

## 2019-08-03 ENCOUNTER — Other Ambulatory Visit: Payer: Self-pay

## 2019-08-03 DIAGNOSIS — E2839 Other primary ovarian failure: Secondary | ICD-10-CM

## 2019-08-18 DIAGNOSIS — Z23 Encounter for immunization: Secondary | ICD-10-CM | POA: Diagnosis not present

## 2019-08-27 ENCOUNTER — Ambulatory Visit (AMBULATORY_SURGERY_CENTER): Payer: Self-pay | Admitting: *Deleted

## 2019-08-27 ENCOUNTER — Other Ambulatory Visit: Payer: Self-pay

## 2019-08-27 VITALS — Temp 96.9°F | Ht 61.0 in | Wt 165.8 lb

## 2019-08-27 DIAGNOSIS — Z800 Family history of malignant neoplasm of digestive organs: Secondary | ICD-10-CM

## 2019-08-27 DIAGNOSIS — Z1211 Encounter for screening for malignant neoplasm of colon: Secondary | ICD-10-CM

## 2019-08-27 MED ORDER — PEG 3350-KCL-NA BICARB-NACL 420 G PO SOLR: 4000.0000 mL | Freq: Once | ORAL | 0 refills | Status: AC

## 2019-08-27 NOTE — Progress Notes (Signed)
No egg or soy allergy known to patient  No issues with past sedation with any surgeries  or procedures, no intubation problems  No diet pills per patient No home 02 use per patient  No blood thinners per patient  Pt denies issues with constipation  No A fib or A flutter  EMMI video sent to pt's e mail  

## 2019-09-09 ENCOUNTER — Telehealth: Payer: Self-pay

## 2019-09-09 NOTE — Telephone Encounter (Signed)
Covid-19 screening questions   Do you now or have you had a fever in the last 14 days? NO   Do you have any respiratory symptoms of shortness of breath or cough now or in the last 14 days? NO  Do you have any family members or close contacts with diagnosed or suspected Covid-19 in the past 14 days? NO  Have you been tested for Covid-19 and found to be positive? NO        

## 2019-09-10 ENCOUNTER — Ambulatory Visit (AMBULATORY_SURGERY_CENTER): Payer: Medicare Other | Admitting: Gastroenterology

## 2019-09-10 ENCOUNTER — Other Ambulatory Visit: Payer: Self-pay

## 2019-09-10 ENCOUNTER — Encounter: Payer: Self-pay | Admitting: Gastroenterology

## 2019-09-10 VITALS — BP 118/52 | HR 79 | Temp 98.2°F | Resp 13 | Ht 61.0 in | Wt 165.0 lb

## 2019-09-10 DIAGNOSIS — Z800 Family history of malignant neoplasm of digestive organs: Secondary | ICD-10-CM | POA: Diagnosis not present

## 2019-09-10 DIAGNOSIS — Z1211 Encounter for screening for malignant neoplasm of colon: Secondary | ICD-10-CM | POA: Diagnosis not present

## 2019-09-10 MED ORDER — SODIUM CHLORIDE 0.9 % IV SOLN: 500.0000 mL | Freq: Once | INTRAVENOUS | Status: DC

## 2019-09-10 NOTE — Progress Notes (Signed)
Pt Drowsy. VSS. To PACU, report to RN. No anesthetic complications noted.  

## 2019-09-10 NOTE — Progress Notes (Signed)
Pt's states no medical or surgical changes since previsit or office visit.  Temp-June bullock  Vital signs-courtney washington 

## 2019-09-10 NOTE — Patient Instructions (Signed)
Impression/Recommendations:  Hemorrhoid handout given to patient.  Repeat colonoscopy in 5 years for screening purposes.  Resume previous diet. Continue present medications.  YOU HAD AN ENDOSCOPIC PROCEDURE TODAY AT [facility]:   Refer to the procedure report that was given to you for any specific questions about what was found during the examination.  If the procedure report does not answer your questions, please call your gastroenterologist to clarify.  If you requested that your care partner not be given the details of your procedure findings, then the procedure report has been included in a sealed envelope for you to review at your convenience later.  YOU SHOULD EXPECT: Some feelings of bloating in the abdomen. Passage of more gas than usual.  Walking can help get rid of the air that was put into your GI tract during the procedure and reduce the bloating. If you had a lower endoscopy (such as a colonoscopy or flexible sigmoidoscopy) you may notice spotting of blood in your stool or on the toilet paper. If you underwent a bowel prep for your procedure, you may not have a normal bowel movement for a few days.  Please Note:  You might notice some irritation and congestion in your nose or some drainage.  This is from the oxygen used during your procedure.  There is no need for concern and it should clear up in a day or so.  SYMPTOMS TO REPORT IMMEDIATELY:   Following lower endoscopy (colonoscopy or flexible sigmoidoscopy):  Excessive amounts of blood in the stool  Significant tenderness or worsening of abdominal pains  Swelling of the abdomen that is new, acute  Fever of 100F or higher For urgent or emergent issues, a gastroenterologist can be reached at any hour by calling [phone].   DIET:  We do recommend a small meal at first, but then you may proceed to your regular diet.  Drink plenty of fluids but you should avoid alcoholic beverages for 24  hours.  ACTIVITY:  You should plan to take it easy for the rest of today and you should NOT DRIVE or use heavy machinery until tomorrow (because of the sedation medicines used during the test).    FOLLOW UP: Our staff will call the number listed on your records 48-72 hours following your procedure to check on you and address any questions or concerns that you may have regarding the information given to you following your procedure. If we do not reach you, we will leave a message.  We will attempt to reach you two times.  During this call, we will ask if you have developed any symptoms of COVID 19. If you develop any symptoms (ie: fever, flu-like symptoms, shortness of breath, cough etc.) before then, please call [phone].  If you test positive for Covid 19 in the 2 weeks post procedure, please call and report this information to [place].    If any biopsies were taken you will be contacted by phone or by letter within the next 1-3 weeks.  Please call us at [phone] if you have not heard about the biopsies in 3 weeks.    SIGNATURES/CONFIDENTIALITY: You and/or your care partner have signed paperwork which will be entered into your electronic medical record.  These signatures attest to the fact that that the information above on your After Visit Summary has been reviewed and is understood.  Full responsibility of the confidentiality of this discharge information lies with you and/or your care-partner.

## 2019-09-10 NOTE — Op Note (Signed)
[facility] Patient Name: Kaylee Adams Procedure Date: 09/10/2019 9:39 AM MRN: [id number] Endoscopist: Ladene Artist , MD Age: 68 Referring MD:  Date of Birth: 1951-08-03 Gender: Female Account #: [id number] Procedure:                Colonoscopy Indications:              Screening in patient at increased risk: Family                            history of 1st-degree relative with colorectal                            cancer Medicines:                Monitored Anesthesia Care Procedure:                Pre-Anesthesia Assessment:                           - Prior to the procedure, a History and Physical                            was performed, and patient medications and                            allergies were reviewed. The patient's tolerance of                            previous anesthesia was also reviewed. The risks                            and benefits of the procedure and the sedation                            options and risks were discussed with the patient.                            All questions were answered, and informed consent                            was obtained. Prior Anticoagulants: The patient has                            taken no previous anticoagulant or antiplatelet                            agents. ASA Grade Assessment: II - A patient with                            mild systemic disease. After reviewing the risks                            and benefits, the patient was deemed in  satisfactory condition to undergo the procedure.                           After obtaining informed consent, the colonoscope                            was passed under direct vision. Throughout the                            procedure, the patient's blood pressure, pulse, and                            oxygen saturations were monitored continuously. The                            Colonoscope was introduced through the anus and                 advanced to the the cecum, identified by                            appendiceal orifice and ileocecal valve. The                            ileocecal valve, appendiceal orifice, and rectum                            were photographed. The quality of the bowel                            preparation was good. The colonoscopy was performed                            without difficulty. The patient tolerated the                            procedure well. Scope In: 9:46:24 AM Scope Out: 9:58:55 AM Scope Withdrawal Time: 0 hours 9 minutes 56 seconds  Total Procedure Duration: 0 hours 12 minutes 31 seconds  Findings:                 The perianal and digital rectal examinations were                            normal.                           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 Pillow sign.                           External and internal hemorrhoids were found during  retroflexion. The hemorrhoids were small and Grade                            I (internal hemorrhoids that do not prolapse).                           The exam was otherwise without abnormality on                            direct and retroflexion views. Complications:            No immediate complications. Estimated blood loss:                            None. Estimated Blood Loss:     Estimated blood loss: none. 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                           - External and internal hemorrhoids.                           - The examination was otherwise normal on direct                            and retroflexion views.                           - No specimens collected. Recommendation:           - Repeat colonoscopy in 5 years for screening                            purposes.                           - Patient has a contact number available for                            emergencies. The  signs and symptoms of potential                            delayed complications were discussed with the                            patient. Return to normal activities tomorrow.                            Written discharge instructions were provided to the                            patient.                           - Resume previous diet.                           -  Continue present medications. Ladene Artist, MD 09/10/2019 10:01:58 AM This report has been signed electronically.

## 2019-09-14 ENCOUNTER — Telehealth: Payer: Self-pay

## 2019-09-14 NOTE — Telephone Encounter (Signed)
  Follow up Call-  Call back number 09/10/2019  Post procedure Call Back phone  # [phone]  Permission to leave phone message Yes  Some recent data might be hidden     Patient questions:  Do you have a fever, pain , or abdominal swelling? No. Pain Score  0 *  Have you tolerated food without any problems? Yes.    Have you been able to return to your normal activities? Yes.    Do you have any questions about your discharge instructions: Diet   No. Medications  No. Follow up visit  No.  Do you have questions or concerns about your Care? No.  Actions: * If pain score is 4 or above: 1. No action needed, pain <4.Have you developed a fever since your procedure? no  2.   Have you had an respiratory symptoms (SOB or cough) since your procedure? no  3.   Have you tested positive for COVID 19 since your procedure no  4.   Have you had any family members/close contacts diagnosed with the COVID 19 since your procedure?  no   If yes to any of these questions please route to Joylene John, RN and Alphonsa Gin, Therapist, sports.

## 2019-09-25 ENCOUNTER — Ambulatory Visit (INDEPENDENT_AMBULATORY_CARE_PROVIDER_SITE_OTHER): Payer: Medicare Other | Admitting: Internal Medicine

## 2019-09-25 ENCOUNTER — Encounter: Payer: Self-pay | Admitting: Internal Medicine

## 2019-09-25 ENCOUNTER — Ambulatory Visit: Payer: Self-pay | Admitting: *Deleted

## 2019-09-25 ENCOUNTER — Other Ambulatory Visit: Payer: Self-pay

## 2019-09-25 DIAGNOSIS — G8929 Other chronic pain: Secondary | ICD-10-CM

## 2019-09-25 DIAGNOSIS — M79604 Pain in right leg: Secondary | ICD-10-CM

## 2019-09-25 DIAGNOSIS — R200 Anesthesia of skin: Secondary | ICD-10-CM

## 2019-09-25 DIAGNOSIS — M545 Low back pain: Secondary | ICD-10-CM

## 2019-09-25 DIAGNOSIS — Z79899 Other long term (current) drug therapy: Secondary | ICD-10-CM | POA: Diagnosis not present

## 2019-09-25 DIAGNOSIS — E876 Hypokalemia: Secondary | ICD-10-CM | POA: Diagnosis not present

## 2019-09-25 DIAGNOSIS — M792 Neuralgia and neuritis, unspecified: Secondary | ICD-10-CM

## 2019-09-25 DIAGNOSIS — I10 Essential (primary) hypertension: Secondary | ICD-10-CM

## 2019-09-25 NOTE — Progress Notes (Signed)
Virtual Visit via Video Note  I connected with@ on 09/25/19 at  2:30 PM EDT by a video enabled telemedicine application and verified that I am speaking with the correct person using two identifiers. Location patient: home Location provider:work  office Persons participating in the virtual visit: patient, provider  [place] national recommendations  regarding COVID 19 pandemic   video visit is advised over in office visit for this patient.  Patient aware  of the limitations of evaluation and management by telemedicine and  availability of in person appointments. and agreed to proceed.   HPI: Kaylee Adams presents for video visit  SDA sent by nurse triage   Onset for a number of months of left arm numbness and some times soresnss   Has some neck  sx with this  No trauma obv weakness  Worse at night  Is right handed  No facial numbness. Events.   Right leg pain and right hip  getting worse ? From back or other no falling   Some feeling swelling around ankle  Causes her to limp at times  Alewve no help with above sx  Bp is ok in 130 range  133/56 this am   Remote hx of right shoulder surgery rc dr Ninfa Linden  ROS: See pertinent positives and negatives per HPI. No fever  New rash weakness  Noted   Past Medical History:  Diagnosis Date  . Allergy    occ takes otc allergy pill  . CAD (coronary artery disease)    a. 11/2005 Cath: nonobs dzs, 30% LAD lesion on cath ;  b. 11/2005 Echo: NL EF;  c. 04/2012 Echo: EF 55-60%, Gr 2 DD;  d. 04/2012 Ex MV: EF 72%, ST depression in recovery with NL perfusion imaging - HTN response to exercise.  . Chest pressure "fatigue" 05/08/2012  . GERD (gastroesophageal reflux disease)   . Hepatic cyst    on ct [place]  . HLD (hyperlipidemia)   . HTN (hypertension)   . Midsternal chest pain    cardiology eval with non-ischemic stress test 04/2012  . Upper airway cough syndrome 04/29/2015   Trial of max gerd rx 04/29/2015 >>>      Past Surgical History:  Procedure Laterality  Date  . ABDOMINAL HYSTERECTOMY     fibroid tumors  . BREAST BIOPSY    . child birth x 5    . CHOLECYSTECTOMY    . COLONOSCOPY  01/09/2008  . SHOULDER ARTHROSCOPY WITH ROTATOR CUFF REPAIR AND SUBACROMIAL DECOMPRESSION Right 11/03/2013   Procedure: RIGHT SHOULDER ARTHROSCOPY WITH DEBRIDEMENT, ROTATOR CUFF REPAIR AND SUBACROMIAL DECOMPRESSION, DISTAL CLAVICLE RESECTION;  Surgeon: Mcarthur Rossetti, MD;  Location: [facility];  Service: Orthopedics;  Laterality: Right;  Marland Kitchen VESICOVAGINAL FISTULA CLOSURE W/ TAH     for fibroid tumors    Family History  Problem Relation Age of Onset  . Breast cancer Sister   . Thyroid disease Sister   . Hypertension Mother   . Colon cancer Brother 99       dx'd about age 76 per pt-   . Colon polyps Neg Hx   . Esophageal cancer Neg Hx   . Rectal cancer Neg Hx   . Stomach cancer Neg Hx     Social History   Tobacco Use  . Smoking status: Never Smoker  . Smokeless tobacco: Never Used  Substance Use Topics  . Alcohol use: No  . Drug use: No      Current Outpatient Medications:  .  amLODipine (  NORVASC) 10 MG tablet, TAKE 1 TABLET BY MOUTH EVERY DAY, Disp: 90 tablet, Rfl: 3 .  aspirin 81 MG chewable tablet, Chew by mouth daily., Disp: , Rfl:  .  hydrochlorothiazide (HYDRODIURIL) 25 MG tablet, Take 1 tablet (25 mg total) by mouth daily., Disp: 90 tablet, Rfl: 3 .  losartan (COZAAR) 100 MG tablet, Take 1 tablet (100 mg total) by mouth daily., Disp: 90 tablet, Rfl: 3 .  MAGNESIUM PO, Take by mouth daily., Disp: , Rfl:  .  OVER THE COUNTER MEDICATION, Equate Allergy med prn, Disp: , Rfl:  .  Potassium Gluconate (SM POTASSIUM PO), Take by mouth., Disp: , Rfl:  .  vitamin C (ASCORBIC ACID) 500 MG tablet, Take 500 mg by mouth daily., Disp: , Rfl:   EXAM: BP Readings from Last 3 Encounters:  09/10/19 (!) 118/52  02/10/19 138/74  11/13/18 118/62    VITALS per patient if applicable: [id number]   GENERAL: alert, oriented, appears well and in no acute  distress  HEENT: atraumatic, conjunttiva clear, no obvious abnormalities on inspection of external nose and ears  NECK: normal movements of the head and neck  LUNGS: on inspection no signs of respiratory distress, breathing rate appears normal, no obvious gross SOB, gasping or wheezing  CV: no obvious cyanosis  MS: moves all visible extremities without noticeable abnormality obvious on call   PSYCH/NEURO: pleasant and cooperative, no obvious depression or anxiety, speech and thought processing grossly intact Lab Results  Component Value Date   WBC 4.8 02/10/2019   HGB 13.2 02/10/2019   HCT 40.2 02/10/2019   PLT 320.0 02/10/2019   GLUCOSE 87 07/17/2019   CHOL 244 (H) 02/10/2019   TRIG 108.0 02/10/2019   HDL 58.60 02/10/2019   LDLDIRECT 153.2 06/23/2012   LDLCALC 164 (H) 02/10/2019   ALT 18 02/10/2019   AST 17 02/10/2019   NA 140 07/17/2019   K 3.2 (L) 07/17/2019   CL 105 07/17/2019   CREATININE 0.89 07/17/2019   BUN 16 07/17/2019   CO2 29 07/17/2019   TSH 0.72 02/10/2019   INR 0.9 11/29/2008   HGBA1C 5.7 02/12/2008    ASSESSMENT AND PLAN:  Discussed the following assessment and plan:    ICD-10-CM   1. Left arm numbness  123456 Basic metabolic panel    Magnesium    Vitamin B12    Ambulatory referral to Sports Medicine    MR Cervical Spine Wo Contrast   radicular ? c spine mri consider neuro eval orther as indicated   2. Medication management  123456 Basic metabolic panel    Magnesium    Vitamin B12  3. Low serum potassium  [id number] Basic metabolic panel    Magnesium    Vitamin B12   using otc supp  recheck lab with mag also   4. Right leg pain  0000000 Basic metabolic panel    Magnesium    Vitamin B12    Ambulatory referral to Sports Medicine  5. Chronic right-sided low back pain, unspecified whether sciatica present  0000000 Basic metabolic panel   0000000 Magnesium    Vitamin B12    Ambulatory referral to Sports Medicine  6. Radicular pain in left arm  M79.2  MR Cervical Spine Wo Contrast  7. Essential hypertension  I10    appears controlled   potassium  mag needs recheck   These sx seem to be ms neuro and not vascular by hx and may not be related   Strategies evaluation discussed  Plan mri of  c spine   Suspecting radicular sx    SM /ortho evaluation of leg hip and back   Repeat  Potassium  Etc noted last  Time is taking  otc potassium   Counseled.   Expectant management and discussion of plan and treatment with opportunity to ask questions and all were answered. The patient agreed with the plan and demonstrated an understanding of the instructions.    Advised to call back or seek an in-person evaluation if worsening  or having  further concerns .   Shanon Ace, MD

## 2019-09-25 NOTE — Telephone Encounter (Signed)
Patient has an appointment at 2:30 PM with PCP

## 2019-09-25 NOTE — Telephone Encounter (Signed)
  Reason for Disposition . [1] Numbness (i.e., loss of sensation) of the face, arm / hand, or leg / foot on one side of the body AND [2] gradual onset (e.g., days to weeks) AND [3] present now  Answer Assessment - Initial Assessment Questions 1. SYMPTOM: "What is the main symptom you are concerned about?" (e.g., weakness, numbness)     Tingling and numbness in L arm - patient also has pain and weakness in R leg and hip/knee 2. ONSET: "When did this start?" (minutes, hours, days; while sleeping)     Numbness in hands for -at least 1 month, pain in hip and leg worse over the week 3. LAST NORMAL: "When was the last time you were normal (no symptoms)?"     Over 1 month 4. PATTERN "Does this come and go, or has it been constant since it started?"  "Is it present now?"     Constant numbness in hand and arm (shoulder down- more pronounce in the fingertips and hand 5. CARDIAC SYMPTOMS: "Have you had any of the following symptoms: chest pain, difficulty breathing, palpitations?"     no 6. NEUROLOGIC SYMPTOMS: "Have you had any of the following symptoms: headache, dizziness, vision loss, double vision, changes in speech, unsteady on your feet?"     no 7. OTHER SYMPTOMS: "Do you have any other symptoms?"     Pain and weakness in R leg 8. PREGNANCY: "Is there any chance you are pregnant?" "When was your last menstrual period?"     n/a  Protocols used: NEUROLOGIC DEFICIT-A-AH

## 2019-09-29 ENCOUNTER — Other Ambulatory Visit (INDEPENDENT_AMBULATORY_CARE_PROVIDER_SITE_OTHER): Payer: Medicare Other

## 2019-09-29 ENCOUNTER — Other Ambulatory Visit: Payer: Self-pay

## 2019-09-29 DIAGNOSIS — M79604 Pain in right leg: Secondary | ICD-10-CM | POA: Diagnosis not present

## 2019-09-29 DIAGNOSIS — M545 Low back pain: Secondary | ICD-10-CM | POA: Diagnosis not present

## 2019-09-29 DIAGNOSIS — R200 Anesthesia of skin: Secondary | ICD-10-CM | POA: Diagnosis not present

## 2019-09-29 DIAGNOSIS — G8929 Other chronic pain: Secondary | ICD-10-CM

## 2019-09-29 DIAGNOSIS — Z79899 Other long term (current) drug therapy: Secondary | ICD-10-CM | POA: Diagnosis not present

## 2019-09-29 DIAGNOSIS — E876 Hypokalemia: Secondary | ICD-10-CM | POA: Diagnosis not present

## 2019-09-29 LAB — VITAMIN B12: Vitamin B-12: 255 pg/mL (ref 211–911)

## 2019-09-29 LAB — BASIC METABOLIC PANEL
BUN: 16 mg/dL (ref 6–23)
CO2: 28 mEq/L (ref 19–32)
Calcium: 10.5 mg/dL (ref 8.4–10.5)
Chloride: 103 mEq/L (ref 96–112)
Creatinine, Ser: 0.75 mg/dL (ref 0.40–1.20)
GFR: 93.06 mL/min (ref >60.00)
Glucose, Bld: 86 mg/dL (ref 70–99)
Potassium: 3.7 mEq/L (ref 3.5–5.1)
Sodium: 140 mEq/L (ref 135–145)

## 2019-09-29 LAB — MAGNESIUM: Magnesium: 2.1 mg/dL (ref 1.5–2.5)

## 2019-10-10 ENCOUNTER — Ambulatory Visit
Admission: RE | Admit: 2019-10-10 | Discharge: 2019-10-10 | Disposition: A | Discharge status: Home / Self Care | Payer: Medicare Other | Source: Ambulatory Visit | Attending: Internal Medicine | Admitting: Internal Medicine

## 2019-10-10 ENCOUNTER — Other Ambulatory Visit: Payer: Self-pay

## 2019-10-10 DIAGNOSIS — M4802 Spinal stenosis, cervical region: Secondary | ICD-10-CM | POA: Diagnosis not present

## 2019-10-10 DIAGNOSIS — R200 Anesthesia of skin: Secondary | ICD-10-CM

## 2019-10-10 DIAGNOSIS — M792 Neuralgia and neuritis, unspecified: Secondary | ICD-10-CM

## 2019-10-11 NOTE — Progress Notes (Signed)
[facility] [place], [place] [address] Phone: [phone] Subjective:   [name], am serving as a scribe for Dr. Hulan Saas.  I'm seeing this patient by the request  of:  Panosh, Standley Brooking, MD   CC: Right knee and hip pain  [id number]  Saraphina SAMERAH Adams is a 68 y.o. female coming in with complaint of right hip and knee pain. Has had pain for years with pain increasing lately. Pain in right knee joint and in the right side of lower back and hip. Feels that it is hard to walk due to pain.   Also having numbness and tingling in left hand that is constant. Occurring for 3 months. Has MRI on Saturday.  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  States that the pain becomes more constant, has not noticed weakness but since then unfortunately and the numbness seems to be occurring now regularly.     Past Medical History:  Diagnosis Date  . Allergy    occ takes otc allergy pill  . CAD (coronary artery disease)    a. 11/2005 Cath: nonobs dzs, 30% LAD lesion on cath ;  b. 11/2005 Echo: NL EF;  c. 04/2012 Echo: EF 55-60%, Gr 2 DD;  d. 04/2012 Ex MV: EF 72%, ST depression in recovery with NL perfusion imaging - HTN response to exercise.  . Chest pressure "fatigue" 05/08/2012  . GERD (gastroesophageal reflux disease)   . Hepatic cyst    on ct [place]  . HLD (hyperlipidemia)   . HTN (hypertension)   . Midsternal chest pain    cardiology eval with non-ischemic stress test 04/2012  . Upper airway cough syndrome 04/29/2015   Trial of max gerd rx 04/29/2015 >>>     Past Surgical History:  Procedure Laterality Date  . ABDOMINAL HYSTERECTOMY     fibroid tumors  . BREAST BIOPSY    . child birth x 5    . CHOLECYSTECTOMY    . COLONOSCOPY  01/09/2008  . SHOULDER ARTHROSCOPY WITH ROTATOR CUFF REPAIR AND SUBACROMIAL DECOMPRESSION Right 11/03/2013   Procedure: RIGHT  SHOULDER ARTHROSCOPY WITH DEBRIDEMENT, ROTATOR CUFF REPAIR AND SUBACROMIAL DECOMPRESSION, DISTAL CLAVICLE RESECTION;  Surgeon: Mcarthur Rossetti, MD;  Location: [facility];  Service: Orthopedics;  Laterality: Right;  Marland Kitchen VESICOVAGINAL FISTULA CLOSURE W/ TAH     for fibroid tumors   Social History   Socioeconomic History  . Marital status: Married    Spouse name: Not on file  . Number of children: 5  . Years of education: Not on file  . Highest education level: Not on file  Occupational History    Employer: [employer]  . Financial resource strain: Not on file  . Food insecurity    Worry: Not on file    Inability: Not on file  . Transportation needs    Medical: Not on file    Non-medical: Not on file  Tobacco Use  . Smoking status: Never Smoker  . Smokeless tobacco: Never Used  Substance and Sexual Activity  . Alcohol use: No  . Drug use: No  . Sexual activity: Not on file  Lifestyle  . Physical activity    Days per week: Not on file    Minutes per session: Not on file  . Stress: Not on file  Relationships  . Social connections    Talks  on phone: Not on file    Gets together: Not on file    Attends religious service: Not on file    Active member of club or organization: Not on file    Attends meetings of clubs or organizations: Not on file    Relationship status: Not on file  Other Topics Concern  . Not on file  Social History Narrative   Married, full time Network engineer. 3 pets    [place]   6-8 hours sleep    Allergies  Allergen Reactions  . Lisinopril     REACTION: cough  . Penicillins Nausea Only   Family History  Problem Relation Age of Onset  . Breast cancer Sister   . Thyroid disease Sister   . Hypertension Mother   . Colon cancer Brother 23       dx'd about age 30 per pt-   . Colon polyps Neg Hx   . Esophageal cancer Neg Hx   . Rectal cancer Neg Hx   . Stomach cancer Neg Hx      Current Outpatient Medications  (Cardiovascular):  .  amLODipine (NORVASC) 10 MG tablet, TAKE 1 TABLET BY MOUTH EVERY DAY .  hydrochlorothiazide (HYDRODIURIL) 25 MG tablet, Take 1 tablet (25 mg total) by mouth daily. Marland Kitchen  losartan (COZAAR) 100 MG tablet, Take 1 tablet (100 mg total) by mouth daily.   Current Outpatient Medications (Analgesics):  .  aspirin 81 MG chewable tablet, Chew by mouth daily.   Current Outpatient Medications (Other):  Marland Kitchen  MAGNESIUM PO, Take by mouth daily. Marland Kitchen  OVER THE COUNTER MEDICATION, Equate Allergy med prn .  Potassium Gluconate (SM POTASSIUM PO), Take by mouth. .  vitamin C (ASCORBIC ACID) 500 MG tablet, Take 500 mg by mouth daily.    Past medical history, social, surgical and family history all reviewed in electronic medical record.  No pertanent information unless stated regarding to the chief complaint.   Review of Systems:  No headache, visual changes, nausea, vomiting, diarrhea, constipation, dizziness, abdominal pain, skin rash, fevers, chills, night sweats, weight loss, swollen lymph nodes, body aches, joint swelling,  chest pain, shortness of breath, mood changes.  Positive muscle aches  Objective  Blood pressure 132/88, pulse 67, height 5\' 1"  (1.549 m), weight 165 lb (74.8 kg), SpO2 99 %.    General: No apparent distress alert and oriented x3 mood and affect normal, dressed appropriately.  HEENT: Pupils equal, extraocular movements intact  Respiratory: Patient's speak in full sentences and does not appear short of breath  Cardiovascular: No lower extremity edema, non tender, no erythema  Skin: Warm dry intact with no signs of infection or rash on extremities or on axial skeleton.  Abdomen: Soft nontender  Neuro: Cranial nerves II through XII are intact, neurovascularly intact in all extremities with 2+ DTRs and 2+ pulses.  Lymph: No lymphadenopathy of posterior or anterior cervical chain or axillae bilaterally.  Gait normal with good balance and coordination.  MSK:  Non tender  with full range of motion and good stability and symmetric strength and tone of shoulders, elbows, wrist, , and ankles bilaterally.  Neck exam does show the loss of lordosis.  Positive Spurling's with radicular symptoms in the C8 distribution of the left side.  Patient strength and deep tendon reflexes are intact.  Patient does have some limited range of motion of the neck in all planes of 5 to 10 degrees.  Right hip exam shows some mild decrease in range of motion.  Patient is mildly tender over the lateral aspect of the hip.  Negative straight leg test.  Mild pain with Corky Sox test but more at the knee  Knee: Right valgus deformity noted.  Abnormal thigh to calf ratio.  Tender to palpation over medial and PF joint line.  ROM full in flexion and extension and lower leg rotation. instability with valgus force.  painful patellar compression. Patellar glide with mild crepitus. Patellar and quadriceps tendons unremarkable. Hamstring and quadriceps strength is normal. Contralateral knee shows mild arthritic changes  After informed written and verbal consent, patient was seated on exam table. Right knee was prepped with alcohol swab and utilizing anterolateral approach, patient's right knee space was injected with 4:1  marcaine 0.5%: Kenalog 40mg /dL. Patient tolerated the procedure well without immediate complications.   Impression and Recommendations:     This case required medical decision making of moderate complexity. The above documentation has been reviewed and is accurate and complete Lyndal Pulley, DO       Note: This dictation was prepared with Dragon dictation along with smaller phrase technology. Any transcriptional errors that result from this process are unintentional.

## 2019-10-12 ENCOUNTER — Ambulatory Visit (INDEPENDENT_AMBULATORY_CARE_PROVIDER_SITE_OTHER): Payer: Medicare Other | Admitting: Family Medicine

## 2019-10-12 ENCOUNTER — Ambulatory Visit (INDEPENDENT_AMBULATORY_CARE_PROVIDER_SITE_OTHER)
Admission: RE | Admit: 2019-10-12 | Discharge: 2019-10-12 | Disposition: A | Discharge status: Home / Self Care | Payer: Medicare Other | Source: Ambulatory Visit | Attending: Family Medicine | Admitting: Family Medicine

## 2019-10-12 ENCOUNTER — Encounter: Payer: Self-pay | Admitting: Family Medicine

## 2019-10-12 ENCOUNTER — Other Ambulatory Visit: Payer: Self-pay

## 2019-10-12 VITALS — BP 132/88 | HR 67 | Ht 61.0 in | Wt 165.0 lb

## 2019-10-12 DIAGNOSIS — M25551 Pain in right hip: Secondary | ICD-10-CM | POA: Diagnosis not present

## 2019-10-12 DIAGNOSIS — G8929 Other chronic pain: Secondary | ICD-10-CM

## 2019-10-12 DIAGNOSIS — M545 Low back pain, unspecified: Secondary | ICD-10-CM

## 2019-10-12 DIAGNOSIS — M1711 Unilateral primary osteoarthritis, right knee: Secondary | ICD-10-CM | POA: Diagnosis not present

## 2019-10-12 DIAGNOSIS — M542 Cervicalgia: Secondary | ICD-10-CM

## 2019-10-12 DIAGNOSIS — M25561 Pain in right knee: Secondary | ICD-10-CM | POA: Diagnosis not present

## 2019-10-12 NOTE — Patient Instructions (Signed)
Good to see you.  Ice 20 minutes 2 times daily. Usually after activity and before bed. Voltaren gel over the counter Turmeric 500mg  daily  Tart cherry extract 1200mg  at night Vitamin D 2000 IU daily  See me again in 6 weeks

## 2019-10-13 DIAGNOSIS — M1711 Unilateral primary osteoarthritis, right knee: Secondary | ICD-10-CM | POA: Insufficient documentation

## 2019-10-13 DIAGNOSIS — M25551 Pain in right hip: Secondary | ICD-10-CM | POA: Insufficient documentation

## 2019-10-13 NOTE — Assessment & Plan Note (Signed)
Patient has right hip pain.  Seems to be multifactorial, could be compensating for the leg.  Discussed icing regimen from exercise, patient will follow-up with me again 4 weeks.  Secondary to polyarthralgia will get laboratory work-up as well.

## 2019-10-13 NOTE — Assessment & Plan Note (Signed)
Patient given injection and tolerated the procedure well.  Discussed icing regimen and home exercise, which activities of doing which wants to avoid.  Patient will increase activity as tolerated.  Patient could be a candidate for viscosupplementation.  Follow-up in 4 weeks

## 2019-10-14 ENCOUNTER — Telehealth: Payer: Self-pay

## 2019-10-14 NOTE — Telephone Encounter (Signed)
Patient is scheduled for injection on Friday and she feels that something else is going on in regards to nodules in her neck. Patient wants to know does Dr. Tamala Julian think she should wait before getting injection until she gets imaging done for nodules. Please advise.

## 2019-10-14 NOTE — Telephone Encounter (Signed)
If in pain I think she can continue with the epidural first

## 2019-10-14 NOTE — Telephone Encounter (Signed)
Spoke with patient per Dr. Tamala Julian recommendation.

## 2019-10-16 ENCOUNTER — Other Ambulatory Visit: Payer: Self-pay

## 2019-10-16 ENCOUNTER — Ambulatory Visit
Admission: RE | Admit: 2019-10-16 | Discharge: 2019-10-16 | Disposition: A | Discharge status: Home / Self Care | Payer: Medicare Other | Source: Ambulatory Visit | Attending: Family Medicine | Admitting: Family Medicine

## 2019-10-16 ENCOUNTER — Other Ambulatory Visit: Payer: Self-pay | Admitting: Neurology

## 2019-10-16 DIAGNOSIS — M542 Cervicalgia: Secondary | ICD-10-CM

## 2019-10-16 MED ORDER — IOPAMIDOL (ISOVUE-M 300) INJECTION 61%: 1.0000 mL | Freq: Once | INTRAMUSCULAR | Status: DC

## 2019-10-16 MED ORDER — TRIAMCINOLONE ACETONIDE 40 MG/ML IJ SUSP (RADIOLOGY): 60.0000 mg | Freq: Once | INTRAMUSCULAR | Status: DC

## 2019-11-23 ENCOUNTER — Encounter: Payer: Self-pay | Admitting: Family Medicine

## 2019-11-23 ENCOUNTER — Ambulatory Visit (INDEPENDENT_AMBULATORY_CARE_PROVIDER_SITE_OTHER): Payer: Medicare Other | Admitting: Family Medicine

## 2019-11-23 ENCOUNTER — Other Ambulatory Visit: Payer: Self-pay

## 2019-11-23 DIAGNOSIS — M1711 Unilateral primary osteoarthritis, right knee: Secondary | ICD-10-CM

## 2019-11-23 NOTE — Patient Instructions (Signed)
See me when you need me

## 2019-11-23 NOTE — Assessment & Plan Note (Signed)
Patient has been doing relatively well overall on 100% of the injection.  Discussed the possibility of viscosupplementation.  Discussed icing regimen.  Patient will increase activity slowly over the course the next several days.  Follow-up with me again in 4 to 8 weeks

## 2019-11-23 NOTE — Progress Notes (Signed)
[facility] [address], [place] [address] Phone: [phone] Subjective:   I Kaylee Adams am serving as a Education administrator for Dr. Hulan Saas.  I'm seeing this patient by the request  of:  Panosh, Standley Brooking, MD   This visit occurred during the SARS-CoV-2 public health emergency.  Safety protocols were in place, including screening questions prior to the visit, additional usage of staff PPE, and extensive cleaning of exam room while observing appropriate contact time as indicated for disinfecting solutions.     CC: Right knee pain follow-up  [id number]   10/12/2019 Patient has right hip pain.  Seems to be multifactorial, could be compensating for the leg.  Discussed icing regimen from exercise, patient will follow-up with me again 4 weeks.  Secondary to polyarthralgia will get laboratory work-up as well.  Patient given injection and tolerated the procedure well.  Discussed icing regimen and home exercise, which activities of doing which wants to avoid.  Patient will increase activity as tolerated.  Patient could be a candidate for viscosupplementation.  Follow-up in 4 weeks   Update 11/23/2019 Kaylee Adams is a 68 y.o. female coming in with complaint of right hip and knee pain. Patient states she is doing well.  Patient states with the injection of the knee she is feeling 100% better.  Still has some instability of the knee but as long as she wears the brace seems to be doing relatively better.  Has not noticed any more swelling.  Did improve with range of motion.  Does not stop her from any activities at the moment      Past Medical History:  Diagnosis Date   Allergy    occ takes otc allergy pill   CAD (coronary artery disease)    a. 11/2005 Cath: nonobs dzs, 30% LAD lesion on cath ;  b. 11/2005 Echo: NL EF;  c. 04/2012 Echo: EF 55-60%, Gr 2 DD;  d. 04/2012 Ex MV: EF 72%, ST depression in recovery with NL perfusion imaging - HTN response to  exercise.   Chest pressure "fatigue" 05/08/2012   GERD (gastroesophageal reflux disease)    Hepatic cyst    on ct [place]   HLD (hyperlipidemia)    HTN (hypertension)    Midsternal chest pain    cardiology eval with non-ischemic stress test 04/2012   Upper airway cough syndrome 04/29/2015   Trial of max gerd rx 04/29/2015 >>>     Past Surgical History:  Procedure Laterality Date   ABDOMINAL HYSTERECTOMY     fibroid tumors   BREAST BIOPSY     child birth x 5     CHOLECYSTECTOMY     COLONOSCOPY  01/09/2008   SHOULDER ARTHROSCOPY WITH ROTATOR CUFF REPAIR AND SUBACROMIAL DECOMPRESSION Right 11/03/2013   Procedure: RIGHT SHOULDER ARTHROSCOPY WITH DEBRIDEMENT, ROTATOR CUFF REPAIR AND SUBACROMIAL DECOMPRESSION, DISTAL CLAVICLE RESECTION;  Surgeon: Mcarthur Rossetti, MD;  Location: [facility];  Service: Orthopedics;  Laterality: Right;   VESICOVAGINAL FISTULA CLOSURE W/ TAH     for fibroid tumors   Social History   Socioeconomic History   Marital status: Married    Spouse name: Not on file   Number of children: 5   Years of education: Not on file   Highest education level: Not on file  Occupational History    Employer: [employer]  Social Needs   Financial resource strain: Not on file   Food insecurity    Worry: Not on file  Inability: Not on file   Transportation needs    Medical: Not on file    Non-medical: Not on file  Tobacco Use   Smoking status: Never Smoker   Smokeless tobacco: Never Used  Substance and Sexual Activity   Alcohol use: No   Drug use: No   Sexual activity: Not on file  Lifestyle   Physical activity    Days per week: Not on file    Minutes per session: Not on file   Stress: Not on file  Relationships   Social connections    Talks on phone: Not on file    Gets together: Not on file    Attends religious service: Not on file    Active member of club or organization: Not on file    Attends meetings of clubs or  organizations: Not on file    Relationship status: Not on file  Other Topics Concern   Not on file  Social History Narrative   Married, full time Network engineer. 3 pets    [place]   6-8 hours sleep    Allergies  Allergen Reactions   Lisinopril     REACTION: cough   Penicillins Nausea Only   Family History  Problem Relation Age of Onset   Breast cancer Sister    Thyroid disease Sister    Hypertension Mother    Colon cancer Brother 66       dx'd about age 18 per pt-    Colon polyps Neg Hx    Esophageal cancer Neg Hx    Rectal cancer Neg Hx    Stomach cancer Neg Hx      Current Outpatient Medications (Cardiovascular):    amLODipine (NORVASC) 10 MG tablet, TAKE 1 TABLET BY MOUTH EVERY DAY   hydrochlorothiazide (HYDRODIURIL) 25 MG tablet, Take 1 tablet (25 mg total) by mouth daily.   losartan (COZAAR) 100 MG tablet, Take 1 tablet (100 mg total) by mouth daily.   Current Outpatient Medications (Analgesics):    aspirin 81 MG chewable tablet, Chew by mouth daily.   Current Outpatient Medications (Other):    MAGNESIUM PO, Take by mouth daily.   OVER THE COUNTER MEDICATION, Equate Allergy med prn   Potassium Gluconate (SM POTASSIUM PO), Take by mouth.   vitamin C (ASCORBIC ACID) 500 MG tablet, Take 500 mg by mouth daily.    Past medical history, social, surgical and family history all reviewed in electronic medical record.  No pertanent information unless stated regarding to the chief complaint.   Review of Systems:  No headache, visual changes, nausea, vomiting, diarrhea, constipation, dizziness, abdominal pain, skin rash, fevers, chills, night sweats, weight loss, swollen lymph nodes, body aches, joint swelling, , chest pain, shortness of breath, mood changes.  Positive muscle aches  Objective  Blood pressure 130/70, pulse 70, height 5\' 1"  (1.549 m), SpO2 96 %.    General: No apparent distress alert and oriented x3 mood and affect normal, dressed  appropriately.  HEENT: Pupils equal, extraocular movements intact  Respiratory: Patient's speak in full sentences and does not appear short of breath  Cardiovascular: Trace lower extremity edema, non tender, no erythema  Skin: Warm dry intact with no signs of infection or rash on extremities or on axial skeleton.  Abdomen: Soft nontender  Neuro: Cranial nerves II through XII are intact, neurovascularly intact in all extremities with 2+ DTRs and 2+ pulses.  Lymph: No lymphadenopathy of posterior or anterior cervical chain or axillae bilaterally.  Gait normal with good  balance and coordination.  Improvement in gait noted MSK:  tender with full range of motion and good stability and symmetric strength and tone of shoulders, elbows, wrist, hip and ankles bilaterally.  Knee: Right valgus deformity noted.  Abnormal thigh to calf ratio.  Tender to palpation over medial and PF joint line.  ROM full in flexion and extension and lower leg rotation. instability with valgus force.  painful patellar compression. Patellar glide with moderate crepitus. Patellar and quadriceps tendons unremarkable. Hamstring and quadriceps strength is normal. Contralateral knee shows mild arthritic changes as well      Impression and Recommendations:    . The above documentation has been reviewed and is accurate and complete Lyndal Pulley, DO       Note: This dictation was prepared with Dragon dictation along with smaller phrase technology. Any transcriptional errors that result from this process are unintentional.

## 2019-12-10 ENCOUNTER — Other Ambulatory Visit: Payer: Self-pay | Admitting: Internal Medicine

## 2019-12-11 ENCOUNTER — Other Ambulatory Visit: Payer: Self-pay | Admitting: Internal Medicine

## 2020-02-18 ENCOUNTER — Other Ambulatory Visit: Payer: Self-pay | Admitting: Internal Medicine

## 2020-02-18 DIAGNOSIS — Z1231 Encounter for screening mammogram for malignant neoplasm of breast: Secondary | ICD-10-CM

## 2020-02-23 ENCOUNTER — Ambulatory Visit
Admission: RE | Admit: 2020-02-23 | Discharge: 2020-02-23 | Disposition: A | Discharge status: Home / Self Care | Payer: Medicare Other | Source: Ambulatory Visit | Attending: Internal Medicine | Admitting: Internal Medicine

## 2020-02-23 ENCOUNTER — Other Ambulatory Visit: Payer: Self-pay

## 2020-02-23 DIAGNOSIS — Z1231 Encounter for screening mammogram for malignant neoplasm of breast: Secondary | ICD-10-CM | POA: Diagnosis not present

## 2020-02-25 ENCOUNTER — Other Ambulatory Visit: Payer: Self-pay

## 2020-02-26 ENCOUNTER — Ambulatory Visit (INDEPENDENT_AMBULATORY_CARE_PROVIDER_SITE_OTHER): Payer: Medicare Other | Admitting: Internal Medicine

## 2020-02-26 ENCOUNTER — Encounter: Payer: Self-pay | Admitting: Internal Medicine

## 2020-02-26 VITALS — BP 134/60 | HR 86 | Temp 98.2°F | Ht 61.0 in | Wt 169.6 lb

## 2020-02-26 DIAGNOSIS — I10 Essential (primary) hypertension: Secondary | ICD-10-CM

## 2020-02-26 DIAGNOSIS — E785 Hyperlipidemia, unspecified: Secondary | ICD-10-CM

## 2020-02-26 DIAGNOSIS — Z79899 Other long term (current) drug therapy: Secondary | ICD-10-CM | POA: Diagnosis not present

## 2020-02-26 DIAGNOSIS — E876 Hypokalemia: Secondary | ICD-10-CM

## 2020-02-26 NOTE — Patient Instructions (Addendum)
Glad you are doing well.  Agree  Intensify lifestyle interventions. As planned .  Bp is good today  Will notify you  of labs when available.    Wt Readings from Last 3 Encounters:  02/26/20 169 lb 9.6 oz (76.9 kg)  10/12/19 165 lb (74.8 kg)  09/10/19 165 lb (74.8 kg)

## 2020-02-26 NOTE — Progress Notes (Signed)
Chief Complaint  Patient presents with  . Annual Exam    Pt has no concerns today     HPI: Patient  Kaylee Adams  69 y.o. comes in today for yearly  Chronic disease management check . Bp good when checks Gained some weight.  Is doing well since retired   Stage manager Date Due  . MAMMOGRAM  02/22/2022  . COLONOSCOPY  09/09/2024  . TETANUS/TDAP  02/01/2027  . INFLUENZA VACCINE  Completed  . DEXA SCAN  Completed  . Hepatitis C Screening  Completed  . PNA vac Low Risk Adult  Completed   Health Maintenance Review LIFESTYLE:  Exercise:  Not as much  Tobacco/ETS: no Alcohol:  bi Sugar beverages: Sleep: good  Coffee   Not a lot. Drug use: no HH of 2  No pets  Work: still retired    ROS:  GEN/ HEENT: No fever, significant weight changes sweats headaches vision problems hearing changes, CV/ PULM; No chest pain shortness of breath cough, syncope,edema  change in exercise tolerance. GI /GU: No adominal pain, vomiting, change in bowel habits. No blood in the stool. No significant GU symptoms. SKIN/HEME: ,no acute skin rashes suspicious lesions or bleeding. No lymphadenopathy, nodules, masses.  NEURO/ PSYCH:  No neurologic signs such as weakness numbness. No depression anxiety. IMM/ Allergy: No unusual infections.  Allergy .   REST of 12 system review negative except as per HPI   Past Medical History:  Diagnosis Date  . Allergy    occ takes otc allergy pill  . CAD (coronary artery disease)    a. 11/2005 Cath: nonobs dzs, 30% LAD lesion on cath ;  b. 11/2005 Echo: NL EF;  c. 04/2012 Echo: EF 55-60%, Gr 2 DD;  d. 04/2012 Ex MV: EF 72%, ST depression in recovery with NL perfusion imaging - HTN response to exercise.  . Chest pressure "fatigue" 05/08/2012  . GERD (gastroesophageal reflux disease)   . Hepatic cyst    on ct [place]  . HLD (hyperlipidemia)   . HTN (hypertension)   . Midsternal chest pain    cardiology eval with non-ischemic stress test 04/2012  . Upper  airway cough syndrome 04/29/2015   Trial of max gerd rx 04/29/2015 >>>      Past Surgical History:  Procedure Laterality Date  . ABDOMINAL HYSTERECTOMY     fibroid tumors  . BREAST BIOPSY Right   . BREAST EXCISIONAL BIOPSY Right   . child birth x 5    . CHOLECYSTECTOMY    . COLONOSCOPY  01/09/2008  . SHOULDER ARTHROSCOPY WITH ROTATOR CUFF REPAIR AND SUBACROMIAL DECOMPRESSION Right 11/03/2013   Procedure: RIGHT SHOULDER ARTHROSCOPY WITH DEBRIDEMENT, ROTATOR CUFF REPAIR AND SUBACROMIAL DECOMPRESSION, DISTAL CLAVICLE RESECTION;  Surgeon: Mcarthur Rossetti, MD;  Location: [facility];  Service: Orthopedics;  Laterality: Right;  Marland Kitchen VESICOVAGINAL FISTULA CLOSURE W/ TAH     for fibroid tumors    Family History  Problem Relation Age of Onset  . Breast cancer Sister   . Thyroid disease Sister   . Hypertension Mother   . Colon cancer Brother 63       dx'd about age 8 per pt-   . Colon polyps Neg Hx   . Esophageal cancer Neg Hx   . Rectal cancer Neg Hx   . Stomach cancer Neg Hx     Social History   Socioeconomic History  . Marital status: Married    Spouse name: Not on file  . Number  of children: 5  . Years of education: Not on file  . Highest education level: Not on file  Occupational History    Employer: [employer]  Tobacco Use  . Smoking status: Never Smoker  . Smokeless tobacco: Never Used  Substance and Sexual Activity  . Alcohol use: No  . Drug use: No  . Sexual activity: Not on file  Other Topics Concern  . Not on file  Social History Narrative   Married, full time Network engineer. 3 pets    [place]   6-8 hours sleep    Social Determinants of Health   Financial Resource Strain:   . Difficulty of Paying Living Expenses: Not on file  Food Insecurity:   . Worried About Charity fundraiser in the Last Year: Not on file  . Ran Out of Food in the Last Year: Not on file  Transportation Needs:   . Lack of Transportation (Medical): Not on file  . Lack of  Transportation (Non-Medical): Not on file  Physical Activity:   . Days of Exercise per Week: Not on file  . Minutes of Exercise per Session: Not on file  Stress:   . Feeling of Stress : Not on file  Social Connections:   . Frequency of Communication with Friends and Family: Not on file  . Frequency of Social Gatherings with Friends and Family: Not on file  . Attends Religious Services: Not on file  . Active Member of Clubs or Organizations: Not on file  . Attends Archivist Meetings: Not on file  . Marital Status: Not on file    Outpatient Medications Prior to Visit  Medication Sig Dispense Refill  . amLODipine (NORVASC) 10 MG tablet TAKE 1 TABLET BY MOUTH EVERY DAY 90 tablet 3  . aspirin 81 MG chewable tablet Chew by mouth daily.    . hydrochlorothiazide (HYDRODIURIL) 25 MG tablet TAKE 1 TABLET BY MOUTH EVERY DAY 90 tablet 3  . losartan (COZAAR) 100 MG tablet TAKE 1 TABLET BY MOUTH EVERY DAY 90 tablet 3  . MAGNESIUM PO Take by mouth daily.    Marland Kitchen OVER THE COUNTER MEDICATION Equate Allergy med prn    . Potassium Gluconate (SM POTASSIUM PO) Take by mouth.    . vitamin C (ASCORBIC ACID) 500 MG tablet Take 500 mg by mouth daily.     No facility-administered medications prior to visit.     EXAM:  BP 134/60 (BP Location: Right Arm, Patient Position: Sitting, Cuff Size: Normal)   Pulse 86   Temp 98.2 F (36.8 C) (Temporal)   Ht 5\' 1"  (1.549 m)   Wt 169 lb 9.6 oz (76.9 kg)   SpO2 97%   BMI 32.05 kg/m   Body mass index is 32.05 kg/m. Wt Readings from Last 3 Encounters:  02/26/20 169 lb 9.6 oz (76.9 kg)  10/12/19 165 lb (74.8 kg)  09/10/19 165 lb (74.8 kg)    Physical Exam: Vital signs reviewed [id number] is a well-developed well-nourished alert cooperative    who appearsr stated age in no acute distress.  HEENT: normocephalic atraumatic , Eyes: PERRL EOM's full, conjunctiva clear, Ears: no deformity EAC's clear TMs with normal landmarks. Mouth:masked  NECK: supple  without masses, thyromegaly or bruits. CHEST/PULM:  Clear to auscultation and percussion breath sounds equal no wheeze , rales or rhonchi. No chest wall deformities or tenderness. Breast: normal by inspection . No dimpling, discharge, masses, tenderness or discharge . CV: PMI is nondisplaced, S1 S2 no gallops, murmurs,  rubs. Peripheral pulses are full without delay.No JVD .  ABDOMEN: Bowel sounds normal nontender  No guard or rebound, no hepato splenomegal no CVA tenderness.   Extremtities:  No clubbing cyanosis or edema, no acute joint swelling or redness no focal atrophy NEURO:  Oriented x3, cranial nerves 3-12 appear to be intact, no obvious focal weakness,gait within normal limits no abnormal reflexes or asymmetrical SKIN: No acute rashes normal turgor, color, no bruising or petechiae. PSYCH: Oriented, good eye contact, no obvious depression anxiety, cognition and judgment appear normal. LN: no cervical axillary adenopathy  Lab Results  Component Value Date   WBC 4.8 02/10/2019   HGB 13.2 02/10/2019   HCT 40.2 02/10/2019   PLT 320.0 02/10/2019   GLUCOSE 86 09/29/2019   CHOL 244 (H) 02/10/2019   TRIG 108.0 02/10/2019   HDL 58.60 02/10/2019   LDLDIRECT 153.2 06/23/2012   LDLCALC 164 (H) 02/10/2019   ALT 18 02/10/2019   AST 17 02/10/2019   NA 140 09/29/2019   K 3.7 09/29/2019   CL 103 09/29/2019   CREATININE 0.75 09/29/2019   BUN 16 09/29/2019   CO2 28 09/29/2019   TSH 0.72 02/10/2019   INR 0.9 11/29/2008   HGBA1C 5.7 02/12/2008    BP Readings from Last 3 Encounters:  02/26/20 134/60  11/23/19 130/70  10/12/19 132/88    Lab plan  reviewed with patient   ASSESSMENT AND PLAN:  Discussed the following assessment and plan:    ICD-10-CM   1. Essential hypertension  99991111 Basic metabolic panel    Hepatic function panel    Lipid panel    TSH  2. Medication management  123456 Basic metabolic panel    Hepatic function panel    Lipid panel    TSH  3. Low serum potassium   [id number] Basic metabolic panel    Hepatic function panel    Lipid panel    TSH  4. Hyperlipidemia, unspecified hyperlipidemia type  99991111 Basic metabolic panel    Hepatic function panel    Lipid panel    TSH   Is not taking potassium supplement and  Only taking a few times this month Lab ordered today  Patient Care Team: Barney Gertsch, Standley Brooking, MD as PCP - General Milus Banister, MD (Gastroenterology) Fay Records, MD (Cardiology) Patient Instructions   Glad you are doing well.  Agree  Intensify lifestyle interventions. As planned .  Bp is good today  Will notify you  of labs when available.    Wt Readings from Last 3 Encounters:  02/26/20 169 lb 9.6 oz (76.9 kg)  10/12/19 165 lb (74.8 kg)  09/10/19 165 lb (74.8 kg)      Avry [place] K. Yarelly Kuba M.D.

## 2020-03-01 ENCOUNTER — Other Ambulatory Visit: Payer: Self-pay

## 2020-03-01 NOTE — Addendum Note (Signed)
Addended by: Suzette Battiest on: 03/01/2020 03:49 PM   Modules accepted: Orders

## 2020-03-02 ENCOUNTER — Other Ambulatory Visit: Payer: Self-pay

## 2020-03-03 ENCOUNTER — Other Ambulatory Visit (INDEPENDENT_AMBULATORY_CARE_PROVIDER_SITE_OTHER): Payer: Medicare Other

## 2020-03-03 DIAGNOSIS — E876 Hypokalemia: Secondary | ICD-10-CM

## 2020-03-03 DIAGNOSIS — I10 Essential (primary) hypertension: Secondary | ICD-10-CM

## 2020-03-03 DIAGNOSIS — Z79899 Other long term (current) drug therapy: Secondary | ICD-10-CM | POA: Diagnosis not present

## 2020-03-03 DIAGNOSIS — E785 Hyperlipidemia, unspecified: Secondary | ICD-10-CM

## 2020-03-03 LAB — BASIC METABOLIC PANEL
BUN: 14 mg/dL (ref 6–23)
CO2: 29 mEq/L (ref 19–32)
Calcium: 10.3 mg/dL (ref 8.4–10.5)
Chloride: 105 mEq/L (ref 96–112)
Creatinine, Ser: 0.75 mg/dL (ref 0.40–1.20)
GFR: 92.94 mL/min (ref >60.00)
Glucose, Bld: 87 mg/dL (ref 70–99)
Potassium: 3.8 mEq/L (ref 3.5–5.1)
Sodium: 141 mEq/L (ref 135–145)

## 2020-03-03 LAB — HEPATIC FUNCTION PANEL
ALT: 17 U/L (ref 0–35)
AST: 16 U/L (ref 0–37)
Albumin: 4 g/dL (ref 3.5–5.2)
Alkaline Phosphatase: 56 U/L (ref 39–117)
Bilirubin, Direct: 0.1 mg/dL (ref 0.0–0.3)
Total Bilirubin: 0.5 mg/dL (ref 0.2–1.2)
Total Protein: 6.6 g/dL (ref 6.0–8.3)

## 2020-03-03 LAB — LIPID PANEL
Cholesterol: 239 mg/dL — ABNORMAL HIGH (ref 0–200)
HDL: 53.2 mg/dL (ref >39.00)
LDL Cholesterol: 160 mg/dL — ABNORMAL HIGH (ref 0–99)
NonHDL: 185.58
Total CHOL/HDL Ratio: 4
Triglycerides: 129 mg/dL (ref 0.0–149.0)
VLDL: 25.8 mg/dL (ref 0.0–40.0)

## 2020-03-03 LAB — TSH: TSH: 1 u[IU]/mL (ref 0.35–4.50)

## 2020-03-05 ENCOUNTER — Other Ambulatory Visit: Payer: Self-pay | Admitting: Internal Medicine

## 2020-03-09 NOTE — Progress Notes (Signed)
Labs normal x cholesterol still up  let us know if you want to try  a different statin medication to lower  your cholesterol levels  Otherwise Intensify lifestyle interventions.  And follow   The 10-year ASCVD risk score Mikey Bussing DC Brooke Bonito., et al., 2013) is: 13.8%   Values used to calculate the score:     Age: 69 years     Sex: Female     Is Non-Hispanic African American: Yes     Diabetic: No     Tobacco smoker: No     Systolic Blood Pressure: [id number] mmHg     Is BP treated: Yes     HDL Cholesterol: 53.2 mg/dL     Total Cholesterol: 239 mg/dL

## 2020-03-14 ENCOUNTER — Ambulatory Visit: Payer: Medicare Other | Attending: Internal Medicine

## 2020-03-14 DIAGNOSIS — Z23 Encounter for immunization: Secondary | ICD-10-CM

## 2020-03-14 NOTE — Progress Notes (Signed)
   Covid-19 Vaccination Clinic  Name:  Kaylee Adams    MRN: [id number] DOB: 23-Nov-1951  03/14/2020  Kaylee Adams was observed post Covid-19 immunization for 15 minutes without incident. She was provided with Vaccine Information Sheet and instruction to access the V-Safe system.   Kaylee Adams was instructed to call 911 with any severe reactions post vaccine: Marland Kitchen Difficulty breathing  . Swelling of face and throat  . A fast heartbeat  . A bad rash all over body  . Dizziness and weakness   Immunizations Administered    Name Date Dose VIS Date Route   Pfizer COVID-19 Vaccine 03/14/2020 10:15 AM 0.3 mL 12/04/2019 Intramuscular   Manufacturer: [place]   Lot: [id number]   [place]: [id number]

## 2020-04-06 ENCOUNTER — Ambulatory Visit: Payer: Medicare Other | Attending: Internal Medicine

## 2020-04-06 DIAGNOSIS — Z23 Encounter for immunization: Secondary | ICD-10-CM

## 2020-04-06 NOTE — Progress Notes (Signed)
   Covid-19 Vaccination Clinic  Name:  Kaylee Adams    MRN: [id number] DOB: 1951-07-07  04/06/2020  [name] was observed post Covid-19 immunization for 15 minutes without incident. She was provided with Vaccine Information Sheet and instruction to access the V-Safe system.   [name] was instructed to call 911 with any severe reactions post vaccine: Marland Kitchen Difficulty breathing  . Swelling of face and throat  . A fast heartbeat  . A bad rash all over body  . Dizziness and weakness   Immunizations Administered    Name Date Dose VIS Date Route   Pfizer COVID-19 Vaccine 04/06/2020 11:01 AM 0.3 mL 12/04/2019 Intramuscular   Manufacturer: [place]   Lot: [id number]   [place]: [id number]

## 2020-04-14 DIAGNOSIS — H5203 Hypermetropia, bilateral: Secondary | ICD-10-CM | POA: Diagnosis not present

## 2020-04-14 DIAGNOSIS — H2513 Age-related nuclear cataract, bilateral: Secondary | ICD-10-CM | POA: Diagnosis not present

## 2020-06-01 ENCOUNTER — Ambulatory Visit (INDEPENDENT_AMBULATORY_CARE_PROVIDER_SITE_OTHER): Payer: Medicare Other | Admitting: Internal Medicine

## 2020-06-01 ENCOUNTER — Encounter: Payer: Self-pay | Admitting: Internal Medicine

## 2020-06-01 ENCOUNTER — Other Ambulatory Visit: Payer: Self-pay

## 2020-06-01 VITALS — BP 136/78 | HR 91 | Temp 98.1°F | Ht 61.0 in | Wt 165.6 lb

## 2020-06-01 DIAGNOSIS — M79604 Pain in right leg: Secondary | ICD-10-CM

## 2020-06-01 DIAGNOSIS — R2241 Localized swelling, mass and lump, right lower limb: Secondary | ICD-10-CM

## 2020-06-01 DIAGNOSIS — M25551 Pain in right hip: Secondary | ICD-10-CM | POA: Diagnosis not present

## 2020-06-01 DIAGNOSIS — R200 Anesthesia of skin: Secondary | ICD-10-CM | POA: Diagnosis not present

## 2020-06-01 NOTE — Patient Instructions (Signed)
    Pain in hip back and leg  Seems mechanical but since swelling feeling will check vein ultrasound of the right   Leg   And then get you back to dr Tamala Julian  .   Leg length discrepancy  Could also change your gait and cause  imbalance .   Bring your shoes  That feel well.  Also .  If the arm  Feel weak or progressing sx then need to get more evaluation   As you have arthritis and pinched nerve in your neck  That can flare up.

## 2020-06-01 NOTE — Progress Notes (Signed)
Chief Complaint  Patient presents with  . Edema    right ankle swelling  . Ankle Pain    pain in right ankle, leg, hip  . Numbness    Left arm and hand  . Abdominal Pain    lasted a few weeks, not sure and will discuss    HPI: Kaylee Adams 69 y.o. come in for  Multiple complaints  Over the last number of weeks to months she is having difficulty with her light rail right lower extremity including right ankle swelling she is always had some tightness in the calf but also pain along the right lateral leg and now her hip and back hurt on that side where she limps in the morning.  She has had some special shoes that has a little lift on the right and helps her walk a bit better there is no midline back pain Last year she saw Dr. Tamala Julian for knee arthritis and had an injection has a follow-up visit the end of the month. She has had numbness and tingling in her left arm with pain but currently not having a problem she has had an MRI of her neck was felt to have radicular pain and is declining shots into the neck but currently has no weakness. She had a couple week episode of epigastric pain that felt like acid perhaps with one episode of vomiting no diarrhea and went away on its own there is no associated weight loss she is up-to-date on her colonoscopy.  Not too worried about the GI tract symptoms at this time. Blood pressure has been in good control. ROS: See pertinent positives and negatives per HPI.  Past Medical History:  Diagnosis Date  . Allergy    occ takes otc allergy pill  . CAD (coronary artery disease)    a. 11/2005 Cath: nonobs dzs, 30% LAD lesion on cath ;  b. 11/2005 Echo: NL EF;  c. 04/2012 Echo: EF 55-60%, Gr 2 DD;  d. 04/2012 Ex MV: EF 72%, ST depression in recovery with NL perfusion imaging - HTN response to exercise.  . Chest pressure "fatigue" 05/08/2012  . GERD (gastroesophageal reflux disease)   . Hepatic cyst    on ct [place]  . HLD (hyperlipidemia)   . HTN (hypertension)     . Midsternal chest pain    cardiology eval with non-ischemic stress test 04/2012  . Upper airway cough syndrome 04/29/2015   Trial of max gerd rx 04/29/2015 >>>      Family History  Problem Relation Age of Onset  . Breast cancer Sister   . Thyroid disease Sister   . Hypertension Mother   . Colon cancer Brother 47       dx'd about age 53 per pt-   . Colon polyps Neg Hx   . Esophageal cancer Neg Hx   . Rectal cancer Neg Hx   . Stomach cancer Neg Hx     Social History   Socioeconomic History  . Marital status: Married    Spouse name: Not on file  . Number of children: 5  . Years of education: Not on file  . Highest education level: Not on file  Occupational History    Employer: [employer]  Tobacco Use  . Smoking status: Never Smoker  . Smokeless tobacco: Never Used  Substance and Sexual Activity  . Alcohol use: No  . Drug use: No  . Sexual activity: Not on file  Other Topics Concern  . Not  on file  Social History Narrative   Married, full time Network engineer. 3 pets    [place]   6-8 hours sleep    Social Determinants of Health   Financial Resource Strain:   . Difficulty of Paying Living Expenses:   Food Insecurity:   . Worried About Charity fundraiser in the Last Year:   . Arboriculturist in the Last Year:   Transportation Needs:   . Film/video editor (Medical):   Marland Kitchen Lack of Transportation (Non-Medical):   Physical Activity:   . Days of Exercise per Week:   . Minutes of Exercise per Session:   Stress:   . Feeling of Stress :   Social Connections:   . Frequency of Communication with Friends and Family:   . Frequency of Social Gatherings with Friends and Family:   . Attends Religious Services:   . Active Member of Clubs or Organizations:   . Attends Archivist Meetings:   Marland Kitchen Marital Status:     Outpatient Medications Prior to Visit  Medication Sig Dispense Refill  . amLODipine (NORVASC) 10 MG tablet TAKE 1 TABLET BY MOUTH EVERY DAY 90  tablet 3  . aspirin 81 MG chewable tablet Chew by mouth daily.    . hydrochlorothiazide (HYDRODIURIL) 25 MG tablet TAKE 1 TABLET BY MOUTH EVERY DAY 90 tablet 3  . losartan (COZAAR) 100 MG tablet TAKE 1 TABLET BY MOUTH EVERY DAY 90 tablet 3  . MAGNESIUM PO Take by mouth daily.    . MULTIPLE VITAMIN PO Take 1 capsule by mouth daily.    Marland Kitchen OVER THE COUNTER MEDICATION Equate Allergy med prn    . Potassium Gluconate (SM POTASSIUM PO) Take by mouth.    . vitamin C (ASCORBIC ACID) 500 MG tablet Take 500 mg by mouth daily.     No facility-administered medications prior to visit.     EXAM:  BP 136/78   Pulse 91   Temp 98.1 F (36.7 C) (Temporal)   Ht 5\' 1"  (1.549 m)   Wt 165 lb 9.6 oz (75.1 kg)   SpO2 98%   BMI 31.29 kg/m   Body mass index is 31.29 kg/m. Wt Readings from Last 3 Encounters:  06/01/20 165 lb 9.6 oz (75.1 kg)  02/26/20 169 lb 9.6 oz (76.9 kg)  10/12/19 165 lb (74.8 kg)    GENERAL: vitals reviewed and listed above, alert, oriented, appears well hydrated and in no acute distress HEENT: atraumatic, conjunctiva  clear, no obvious abnormalities on inspection of external nose and ears OP : Masked NECK: no obvious masses on inspection palpation  LUNGS: clear to auscultation bilaterally, no wheezes, rales or rhonchi, good air movement CV: HRRR, no clubbing cyanosis or  peripheral edema nl cap refill  MS: moves all extremities somewhat puffy right ankle compared to left minimal edema otherwise no pain range of motion of ankle neurovascular appears intact no midline spine tenderness but some over the right lateral hip.  Toe heel strength appears to be normal.  Left upper extremity normal strength and no gross atrophy. PSYCH: pleasant and cooperative, no obvious depression or anxiety Lab Results  Component Value Date   WBC 4.8 02/10/2019   HGB 13.2 02/10/2019   HCT 40.2 02/10/2019   PLT 320.0 02/10/2019   GLUCOSE 87 03/03/2020   CHOL 239 (H) 03/03/2020   TRIG 129.0 03/03/2020    HDL 53.20 03/03/2020   LDLDIRECT 153.2 06/23/2012   LDLCALC 160 (H) 03/03/2020   ALT  17 03/03/2020   AST 16 03/03/2020   NA 141 03/03/2020   K 3.8 03/03/2020   CL 105 03/03/2020   CREATININE 0.75 03/03/2020   BUN 14 03/03/2020   CO2 29 03/03/2020   TSH 1.00 03/03/2020   INR 0.9 11/29/2008   HGBA1C 5.7 02/12/2008   BP Readings from Last 3 Encounters:  06/01/20 136/78  02/26/20 134/60  11/23/19 130/70    ASSESSMENT AND PLAN:  Discussed the following assessment and plan:  Localized swelling of right lower leg - mostly ankle  poss venous doubt dvt on amlodipine but not symmetrical  - Plan: VAS [place] LOWER EXTREMITY VENOUS REFLUX  Right hip pain  Leg pain, lateral, right - Plan: VAS [place] LOWER EXTREMITY VENOUS REFLUX  Left arm numbness - most likely radicular or referred based on mri c spine currentlly no weakness and no pain will reviewed alamr sx of weakness for seeking care  Right hip back knee ankle sx  radiating vs hip?  Poss leg length discrepancy based on  Hx and   Shoe lift comfort .  Check venous US  Right leg   Have dr Tamala Julian in upcoming appt address this  Right le predicament   Hx upper gastritis type pain that resolved in a few weeks  Reviewed record doing fine now  She thinks could have been gerd sx  Will follow  See prev Gi evals  No alarm sx  -Patient advised to return or notify health care team  if  new concerns arise.  Patient Instructions     Pain in hip back and leg  Seems mechanical but since swelling feeling will check vein ultrasound of the right   Leg   And then get you back to dr Tamala Julian  .   Leg length discrepancy  Could also change your gait and cause  imbalance .   Bring your shoes  That feel well.  Also .  If the arm  Feel weak or progressing sx then need to get more evaluation   As you have arthritis and pinched nerve in your neck  That can flare up.       Standley Brooking. Senica Crall M.D.

## 2020-06-07 ENCOUNTER — Other Ambulatory Visit: Payer: Self-pay

## 2020-06-07 ENCOUNTER — Ambulatory Visit (HOSPITAL_COMMUNITY)
Admission: RE | Admit: 2020-06-07 | Discharge: 2020-06-07 | Disposition: A | Discharge status: Home / Self Care | Payer: Medicare Other | Source: Ambulatory Visit | Attending: Internal Medicine | Admitting: Internal Medicine

## 2020-06-07 DIAGNOSIS — R2241 Localized swelling, mass and lump, right lower limb: Secondary | ICD-10-CM | POA: Diagnosis not present

## 2020-06-07 DIAGNOSIS — M79604 Pain in right leg: Secondary | ICD-10-CM | POA: Diagnosis not present

## 2020-06-07 NOTE — Progress Notes (Signed)
Veins in right leg  have no clot or obstruction or significant   reflux  This is reassuring

## 2020-06-16 ENCOUNTER — Ambulatory Visit (INDEPENDENT_AMBULATORY_CARE_PROVIDER_SITE_OTHER): Payer: Medicare Other | Admitting: Family Medicine

## 2020-06-16 ENCOUNTER — Other Ambulatory Visit: Payer: Self-pay

## 2020-06-16 ENCOUNTER — Telehealth: Payer: Self-pay | Admitting: Family Medicine

## 2020-06-16 ENCOUNTER — Encounter: Payer: Self-pay | Admitting: Family Medicine

## 2020-06-16 VITALS — BP 150/90 | HR 77 | Ht 61.0 in | Wt 167.0 lb

## 2020-06-16 DIAGNOSIS — G5731 Lesion of lateral popliteal nerve, right lower limb: Secondary | ICD-10-CM | POA: Insufficient documentation

## 2020-06-16 DIAGNOSIS — M1711 Unilateral primary osteoarthritis, right knee: Secondary | ICD-10-CM

## 2020-06-16 MED ORDER — GABAPENTIN 100 MG PO CAPS: 200.0000 mg | ORAL_CAPSULE | Freq: Every day | ORAL | 3 refills | Status: DC

## 2020-06-16 NOTE — Patient Instructions (Addendum)
Good to see you Exercise 3 times a week When possible wear orthotic Avoid being barefoot in house Ice 20 mins 2 times a day Gabapentin 200 mg at night See me again in 6 weeks if no better we can inject

## 2020-06-16 NOTE — Telephone Encounter (Signed)
Pt called, AVS for today's visit states " use orthotics when possible". Kaylee Adams is not sure if we mean the insert she has or if she was to be shopping for something new.

## 2020-06-16 NOTE — Assessment & Plan Note (Signed)
Seems to be here as well as the sinus tarsi.  Discussed with patient in great length, discussed compression, home exercises.  Differential includes a potential for a lumbar radiculopathy.  Discussed wearing over-the-counter orthotics that patient is already investigating.  Patient will come back again in 6 weeks and if any worsening symptoms we will consider an injection.

## 2020-06-16 NOTE — Progress Notes (Signed)
[facility] [address] [place] Phone: [phone] Subjective:   I [name] am serving as a Education administrator for Dr. Hulan Saas.  This visit occurred during the SARS-CoV-2 public health emergency.  Safety protocols were in place, including screening questions prior to the visit, additional usage of staff PPE, and extensive cleaning of exam room while observing appropriate contact time as indicated for disinfecting solutions.   I'm seeing this patient by the request  of:  Panosh, Standley Brooking, MD  CC: Right foot and ankle pain  [id number]   11/23/2019 Patient has been doing relatively well overall on 100% of the injection.  Discussed the possibility of viscosupplementation.  Discussed icing regimen.  Patient will increase activity slowly over the course the next several days.  Follow-up with me again in 4 to 8 weeks  Update 06/16/2020 Kaylee Adams is a 69 y.o. female coming in with complaint of right knee pain. Right foot pain as well. Patient states she has lateral ankle pain. Pain radiates up to the knee and lower back. Painful in the mornings. Soreness up the leg. Has been wearing an insole in the right shoe. Helps with comfort but she still experiences pain.  Patient continues to try to be active though.  States it is painful more in the mornings comes up the leg a little bit.  Denies though any weakness or any type of instability     Past Medical History:  Diagnosis Date  . Allergy    occ takes otc allergy pill  . CAD (coronary artery disease)    a. 11/2005 Cath: nonobs dzs, 30% LAD lesion on cath ;  b. 11/2005 Echo: NL EF;  c. 04/2012 Echo: EF 55-60%, Gr 2 DD;  d. 04/2012 Ex MV: EF 72%, ST depression in recovery with NL perfusion imaging - HTN response to exercise.  . Chest pressure "fatigue" 05/08/2012  . GERD (gastroesophageal reflux disease)   . Hepatic cyst    on ct [place]  . HLD (hyperlipidemia)   . HTN (hypertension)   . Midsternal  chest pain    cardiology eval with non-ischemic stress test 04/2012  . Upper airway cough syndrome 04/29/2015   Trial of max gerd rx 04/29/2015 >>>     Past Surgical History:  Procedure Laterality Date  . ABDOMINAL HYSTERECTOMY     fibroid tumors  . BREAST BIOPSY Right   . BREAST EXCISIONAL BIOPSY Right   . child birth x 5    . CHOLECYSTECTOMY    . COLONOSCOPY  01/09/2008  . SHOULDER ARTHROSCOPY WITH ROTATOR CUFF REPAIR AND SUBACROMIAL DECOMPRESSION Right 11/03/2013   Procedure: RIGHT SHOULDER ARTHROSCOPY WITH DEBRIDEMENT, ROTATOR CUFF REPAIR AND SUBACROMIAL DECOMPRESSION, DISTAL CLAVICLE RESECTION;  Surgeon: Mcarthur Rossetti, MD;  Location: [facility];  Service: Orthopedics;  Laterality: Right;  Marland Kitchen VESICOVAGINAL FISTULA CLOSURE W/ TAH     for fibroid tumors   Social History   Socioeconomic History  . Marital status: Married    Spouse name: Not on file  . Number of children: 5  . Years of education: Not on file  . Highest education level: Not on file  Occupational History    Employer: [employer]  Tobacco Use  . Smoking status: Never Smoker  . Smokeless tobacco: Never Used  Vaping Use  . Vaping Use: Never used  Substance and Sexual Activity  . Alcohol use: No  . Drug use: No  . Sexual activity: Not on file  Other  Topics Concern  . Not on file  Social History Narrative   Married, full time Network engineer. 3 pets    [place]   6-8 hours sleep    Social Determinants of Health   Financial Resource Strain:   . Difficulty of Paying Living Expenses:   Food Insecurity:   . Worried About Charity fundraiser in the Last Year:   . Arboriculturist in the Last Year:   Transportation Needs:   . Film/video editor (Medical):   Marland Kitchen Lack of Transportation (Non-Medical):   Physical Activity:   . Days of Exercise per Week:   . Minutes of Exercise per Session:   Stress:   . Feeling of Stress :   Social Connections:   . Frequency of Communication with Friends and Family:   .  Frequency of Social Gatherings with Friends and Family:   . Attends Religious Services:   . Active Member of Clubs or Organizations:   . Attends Archivist Meetings:   Marland Kitchen Marital Status:    Allergies  Allergen Reactions  . Lisinopril     REACTION: cough  . Penicillins Nausea Only   Family History  Problem Relation Age of Onset  . Breast cancer Sister   . Thyroid disease Sister   . Hypertension Mother   . Colon cancer Brother 7       dx'd about age 21 per pt-   . Colon polyps Neg Hx   . Esophageal cancer Neg Hx   . Rectal cancer Neg Hx   . Stomach cancer Neg Hx      Current Outpatient Medications (Cardiovascular):  .  amLODipine (NORVASC) 10 MG tablet, TAKE 1 TABLET BY MOUTH EVERY DAY .  hydrochlorothiazide (HYDRODIURIL) 25 MG tablet, TAKE 1 TABLET BY MOUTH EVERY DAY .  losartan (COZAAR) 100 MG tablet, TAKE 1 TABLET BY MOUTH EVERY DAY   Current Outpatient Medications (Analgesics):  .  aspirin 81 MG chewable tablet, Chew by mouth daily.   Current Outpatient Medications (Other):  Marland Kitchen  MAGNESIUM PO, Take by mouth daily. .  MULTIPLE VITAMIN PO, Take 1 capsule by mouth daily. Marland Kitchen  OVER THE COUNTER MEDICATION, Equate Allergy med prn .  Potassium Gluconate (SM POTASSIUM PO), Take by mouth. .  vitamin C (ASCORBIC ACID) 500 MG tablet, Take 500 mg by mouth daily. Marland Kitchen  gabapentin (NEURONTIN) 100 MG capsule, Take 2 capsules (200 mg total) by mouth at bedtime.   Reviewed prior external information including notes and imaging from  primary care provider As well as notes that were available from care everywhere and other healthcare systems.  Past medical history, social, surgical and family history all reviewed in electronic medical record.  No pertanent information unless stated regarding to the chief complaint.   Review of Systems:  No headache, visual changes, nausea, vomiting, diarrhea, constipation, dizziness, abdominal pain, skin rash, fevers, chills, night sweats,  weight loss, swollen lymph nodes, body aches, joint swelling, chest pain, shortness of breath, mood changes. POSITIVE muscle aches  Objective  Blood pressure (!) 150/90, pulse 77, height 5\' 1"  (1.549 m), weight 167 lb (75.8 kg), SpO2 98 %.   General: No apparent distress alert and oriented x3 mood and affect normal, dressed appropriately.  HEENT: Pupils equal, extraocular movements intact  Respiratory: Patient's speak in full sentences and does not appear short of breath  Cardiovascular: No lower extremity edema, non tender, no erythema  Neuro: Cranial nerves II through XII are intact, neurovascularly intact in  all extremities with 2+ DTRs and 2+ pulses.  Gait antalgic favoring patient's right knee MSK: Patient's right ankle shows the patient does keep any induration state with sitting.  Patient does have 5-5 strength.  Large cyst noted on the anterior lateral aspect of the ankle that seems to actually go to the peroneal tendons as well.  Freely movable and tender.  Negative Tinel's sign.  Negative Thompson test.  Low back exam still has some loss of lordosis.  Tender to palpation in the paraspinal musculature.  Arthritic changes of the knee noted.   [id number]; 15 additional minutes spent for Therapeutic exercises as stated in above notes.  This included exercises focusing on stretching, strengthening, with significant focus on eccentric aspects.   Long term goals include an improvement in range of motion, strength, endurance as well as avoiding reinjury. Patient's frequency would include in 1-2 times a day, 3-5 times a week for a duration of 6-12 weeks.  Ankle strengthening that included:  Basic range of motion exercises to allow proper full motion at ankle Stretching of the lower leg and hamstrings  Theraband exercises for the lower leg - inversion, eversion, dorsiflexion and plantarflexion each to be completed with a theraband Balance exercises to increase proprioception Weight bearing exercises  to increase strength and balance Proper technique shown and discussed handout in great detail with ATC.  All questions were discussed and answered.     Impression and Recommendations:     The above documentation has been reviewed and is accurate and complete Lyndal Pulley, DO       Note: This dictation was prepared with Dragon dictation along with smaller phrase technology. Any transcriptional errors that result from this process are unintentional.

## 2020-06-16 NOTE — Telephone Encounter (Signed)
Spoke with patient providing her name of orthotics to get OTC.

## 2020-07-29 ENCOUNTER — Encounter: Payer: Self-pay | Admitting: Family Medicine

## 2020-07-29 ENCOUNTER — Other Ambulatory Visit: Payer: Self-pay

## 2020-07-29 ENCOUNTER — Ambulatory Visit (INDEPENDENT_AMBULATORY_CARE_PROVIDER_SITE_OTHER): Payer: Medicare Other | Admitting: Family Medicine

## 2020-07-29 ENCOUNTER — Ambulatory Visit (INDEPENDENT_AMBULATORY_CARE_PROVIDER_SITE_OTHER): Payer: Medicare Other

## 2020-07-29 VITALS — BP 130/74 | HR 80 | Ht 61.0 in | Wt 169.0 lb

## 2020-07-29 DIAGNOSIS — G5731 Lesion of lateral popliteal nerve, right lower limb: Secondary | ICD-10-CM | POA: Diagnosis not present

## 2020-07-29 DIAGNOSIS — M62561 Muscle wasting and atrophy, not elsewhere classified, right lower leg: Secondary | ICD-10-CM | POA: Diagnosis not present

## 2020-07-29 DIAGNOSIS — M2550 Pain in unspecified joint: Secondary | ICD-10-CM

## 2020-07-29 DIAGNOSIS — G8929 Other chronic pain: Secondary | ICD-10-CM | POA: Diagnosis not present

## 2020-07-29 DIAGNOSIS — M25571 Pain in right ankle and joints of right foot: Secondary | ICD-10-CM

## 2020-07-29 DIAGNOSIS — R7989 Other specified abnormal findings of blood chemistry: Secondary | ICD-10-CM | POA: Diagnosis not present

## 2020-07-29 NOTE — Assessment & Plan Note (Signed)
Patient since I am seeing her seems to have more of the right leg atrophy at this time.  Patient is having some ankle pain in the sinus tarsi but is having more difficulty with walking.  Some mild foot drop is noted and weakness noted today.  Patient states that she has had some atrophy for some time and there is a potential for the lumbar radiculopathy.  I would like to get ABI to further evaluate the vascular aspect of the leg as well as a nerve conduction test of the lower extremities.  Laboratory work-up ordered today to rule out any autoimmune or inflammatory markers that could be contributing.  Encouraged her to continue the gabapentin.  X-rays of the ankle ordered today to further evaluate but I did do not see anything significant at this time.  Follow-up with me again after all the testing and will discuss further treatment.  This may include MRIs or unfortunately referral to neurology to rule out muscular dystrophy

## 2020-07-29 NOTE — Progress Notes (Signed)
[facility] [address] [place] Phone: [phone] Subjective:   I [name] am serving as a Education administrator for Dr. Hulan Saas.  This visit occurred during the SARS-CoV-2 public health emergency.  Safety protocols were in place, including screening questions prior to the visit, additional usage of staff PPE, and extensive cleaning of exam room while observing appropriate contact time as indicated for disinfecting solutions.   I'm seeing this patient by the request  of:  Panosh, Standley Brooking, MD  CC: Right leg pain  VQM:[id number]   06/16/2020 Seems to be here as well as the sinus tarsi.  Discussed with patient in great length, discussed compression, home exercises.  Differential includes a potential for a lumbar radiculopathy.  Discussed wearing over-the-counter orthotics that patient is already investigating.  Patient will come back again in 6 weeks and if any worsening symptoms we will consider an injection.  Update 07/29/2020 JORDYNNE MCCOWN is a 69 y.o. female coming in with complaint of right knee and right ankle pain. Patient states some days she is not good. Not sure if she is making progress. Entire leg is painful from the knee to ankle. States "My leg feels like it is going to break". Taking Ibuprofen for pain.  Patient states that it does not seem to be getting any better.  Really feels like it is worsening.  Has had pain on this for quite some time but does seem much worse than usual.     Past Medical History:  Diagnosis Date  . Allergy    occ takes otc allergy pill  . CAD (coronary artery disease)    a. 11/2005 Cath: nonobs dzs, 30% LAD lesion on cath ;  b. 11/2005 Echo: NL EF;  c. 04/2012 Echo: EF 55-60%, Gr 2 DD;  d. 04/2012 Ex MV: EF 72%, ST depression in recovery with NL perfusion imaging - HTN response to exercise.  . Chest pressure "fatigue" 05/08/2012  . GERD (gastroesophageal reflux disease)   . Hepatic cyst    on ct [place]  . HLD  (hyperlipidemia)   . HTN (hypertension)   . Midsternal chest pain    cardiology eval with non-ischemic stress test 04/2012  . Upper airway cough syndrome 04/29/2015   Trial of max gerd rx 04/29/2015 >>>     Past Surgical History:  Procedure Laterality Date  . ABDOMINAL HYSTERECTOMY     fibroid tumors  . BREAST BIOPSY Right   . BREAST EXCISIONAL BIOPSY Right   . child birth x 5    . CHOLECYSTECTOMY    . COLONOSCOPY  01/09/2008  . SHOULDER ARTHROSCOPY WITH ROTATOR CUFF REPAIR AND SUBACROMIAL DECOMPRESSION Right 11/03/2013   Procedure: RIGHT SHOULDER ARTHROSCOPY WITH DEBRIDEMENT, ROTATOR CUFF REPAIR AND SUBACROMIAL DECOMPRESSION, DISTAL CLAVICLE RESECTION;  Surgeon: Mcarthur Rossetti, MD;  Location: [facility];  Service: Orthopedics;  Laterality: Right;  Marland Kitchen VESICOVAGINAL FISTULA CLOSURE W/ TAH     for fibroid tumors   Social History   Socioeconomic History  . Marital status: Married    Spouse name: Not on file  . Number of children: 5  . Years of education: Not on file  . Highest education level: Not on file  Occupational History    Employer: [employer]  Tobacco Use  . Smoking status: Never Smoker  . Smokeless tobacco: Never Used  Vaping Use  . Vaping Use: Never used  Substance and Sexual Activity  . Alcohol use: No  . Drug use: No  .  Sexual activity: Not on file  Other Topics Concern  . Not on file  Social History Narrative   Married, full time Network engineer. 3 pets    [place]   6-8 hours sleep    Social Determinants of Health   Financial Resource Strain:   . Difficulty of Paying Living Expenses:   Food Insecurity:   . Worried About Charity fundraiser in the Last Year:   . Arboriculturist in the Last Year:   Transportation Needs:   . Film/video editor (Medical):   Marland Kitchen Lack of Transportation (Non-Medical):   Physical Activity:   . Days of Exercise per Week:   . Minutes of Exercise per Session:   Stress:   . Feeling of Stress :   Social Connections:   .  Frequency of Communication with Friends and Family:   . Frequency of Social Gatherings with Friends and Family:   . Attends Religious Services:   . Active Member of Clubs or Organizations:   . Attends Archivist Meetings:   Marland Kitchen Marital Status:    Allergies  Allergen Reactions  . Lisinopril     REACTION: cough  . Penicillins Nausea Only   Family History  Problem Relation Age of Onset  . Breast cancer Sister   . Thyroid disease Sister   . Hypertension Mother   . Colon cancer Brother 9       dx'd about age 49 per pt-   . Colon polyps Neg Hx   . Esophageal cancer Neg Hx   . Rectal cancer Neg Hx   . Stomach cancer Neg Hx      Current Outpatient Medications (Cardiovascular):  .  amLODipine (NORVASC) 10 MG tablet, TAKE 1 TABLET BY MOUTH EVERY DAY .  hydrochlorothiazide (HYDRODIURIL) 25 MG tablet, TAKE 1 TABLET BY MOUTH EVERY DAY .  losartan (COZAAR) 100 MG tablet, TAKE 1 TABLET BY MOUTH EVERY DAY   Current Outpatient Medications (Analgesics):  .  aspirin 81 MG chewable tablet, Chew by mouth daily.   Current Outpatient Medications (Other):  .  gabapentin (NEURONTIN) 100 MG capsule, Take 2 capsules (200 mg total) by mouth at bedtime. Marland Kitchen  MAGNESIUM PO, Take by mouth daily. .  MULTIPLE VITAMIN PO, Take 1 capsule by mouth daily. Marland Kitchen  OVER THE COUNTER MEDICATION, Equate Allergy med prn .  Potassium Gluconate (SM POTASSIUM PO), Take by mouth. .  vitamin C (ASCORBIC ACID) 500 MG tablet, Take 500 mg by mouth daily.   Reviewed prior external information including notes and imaging from  primary care provider As well as notes that were available from care everywhere and other healthcare systems.  Past medical history, social, surgical and family history all reviewed in electronic medical record.  No pertanent information unless stated regarding to the chief complaint.   Review of Systems:  No headache, visual changes, nausea, vomiting, diarrhea, constipation, dizziness,  abdominal pain, skin rash, fevers, chills, night sweats, weight loss, swollen lymph nodes, body aches, joint swelling, chest pain, shortness of breath, mood changes. POSITIVE muscle aches  Objective  Blood pressure 130/74, pulse 80, height 5\' 1"  (1.549 m), weight 169 lb (76.7 kg), SpO2 97 %.   General: No apparent distress alert and oriented x3 mood and affect normal, dressed appropriately.  HEENT: Pupils equal, extraocular movements intact  Respiratory: Patient's speak in full sentences and does not appear short of breath  Cardiovascular: No lower extremity edema, non tender, no erythema  Neuro:  Gait normal with  good balance and coordination.  MSK: Right leg does show some atrophy compared to the contralateral side.  Seems to be a little worse than patient's previous examination.  Dorsalis pulses 1+.  Compared to 2+ on the contralateral side.  Patient's ankle does have a peroneal cyst noted in some mild anterior impingement.  Negative anterior drawer test.  Patient's knee does have some varus deformity with some instability with valgus and varus force.  Kneecap is a positive patellar grind.  Mild pain in the back on palpation.  Mild positive FABER test.    Impression and Recommendations:     The above documentation has been reviewed and is accurate and complete Lyndal Pulley, DO       Note: This dictation was prepared with Dragon dictation along with smaller phrase technology. Any transcriptional errors that result from this process are unintentional.

## 2020-07-29 NOTE — Patient Instructions (Addendum)
Good to see you Xray today Labs today ABI ordered and scheduled  Nerve conduction study right ordered Depending on what the test show we will discuss what changes need to make. Will decide if we need MRI

## 2020-07-29 NOTE — Assessment & Plan Note (Signed)
X-rays pending to further evaluate any other arthritic changes.  Other lab tests ordered.

## 2020-07-30 LAB — COMPREHENSIVE METABOLIC PANEL
AG Ratio: 1.6 (calc) (ref 1.0–2.5)
ALT: 24 U/L (ref 6–29)
AST: 21 U/L (ref 10–35)
Albumin: 4.4 g/dL (ref 3.6–5.1)
Alkaline phosphatase (APISO): 59 U/L (ref 37–153)
BUN: 15 mg/dL (ref 7–25)
CO2: 25 mmol/L (ref 20–32)
Calcium: 10.7 mg/dL — ABNORMAL HIGH (ref 8.6–10.4)
Chloride: 106 mmol/L (ref 98–110)
Creat: 0.69 mg/dL (ref 0.50–0.99)
Globulin: 2.7 g/dL (calc) (ref 1.9–3.7)
Glucose, Bld: 95 mg/dL (ref 65–99)
Potassium: 4 mmol/L (ref 3.5–5.3)
Sodium: 142 mmol/L (ref 135–146)
Total Bilirubin: 0.5 mg/dL (ref 0.2–1.2)
Total Protein: 7.1 g/dL (ref 6.1–8.1)

## 2020-07-30 LAB — CBC WITH DIFFERENTIAL/PLATELET
Absolute Monocytes: 403 cells/uL (ref 200–950)
Basophils Absolute: 10 cells/uL (ref 0–200)
Basophils Relative: 0.2 %
Eosinophils Absolute: 0 cells/uL — ABNORMAL LOW (ref 15–500)
Eosinophils Relative: 0 %
HCT: 41.7 % (ref 35.0–45.0)
Hemoglobin: 13.4 g/dL (ref 11.7–15.5)
Lymphs Abs: 1699 cells/uL (ref 850–3900)
MCH: 25.6 pg — ABNORMAL LOW (ref 27.0–33.0)
MCHC: 32.1 g/dL (ref 32.0–36.0)
MCV: 79.7 fL — ABNORMAL LOW (ref 80.0–100.0)
MPV: 10 fL (ref 7.5–12.5)
Monocytes Relative: 8.4 %
Neutro Abs: 2688 cells/uL (ref 1500–7800)
Neutrophils Relative %: 56 %
Platelets: 388 10*3/uL (ref 140–400)
RBC: 5.23 10*6/uL — ABNORMAL HIGH (ref 3.80–5.10)
RDW: 13.1 % (ref 11.0–15.0)
Total Lymphocyte: 35.4 %
WBC: 4.8 10*3/uL (ref 3.8–10.8)

## 2020-07-30 LAB — FERRITIN: Ferritin: 74 ng/mL (ref 16–288)

## 2020-07-30 LAB — URIC ACID: Uric Acid, Serum: 5.1 mg/dL (ref 2.5–7.0)

## 2020-07-30 LAB — SEDIMENTATION RATE: Sed Rate: 11 mm/h (ref 0–30)

## 2020-07-30 LAB — TRANSFERRIN: Transferrin: 247 mg/dL (ref 188–341)

## 2020-07-30 LAB — C-REACTIVE PROTEIN: CRP: 1.2 mg/L (ref <8.0)

## 2020-08-01 ENCOUNTER — Other Ambulatory Visit: Payer: Self-pay

## 2020-08-01 DIAGNOSIS — M5410 Radiculopathy, site unspecified: Secondary | ICD-10-CM

## 2020-08-01 DIAGNOSIS — R202 Paresthesia of skin: Secondary | ICD-10-CM

## 2020-08-02 ENCOUNTER — Encounter: Payer: Self-pay | Admitting: Family Medicine

## 2020-08-02 ENCOUNTER — Other Ambulatory Visit: Payer: Self-pay

## 2020-08-02 ENCOUNTER — Ambulatory Visit (INDEPENDENT_AMBULATORY_CARE_PROVIDER_SITE_OTHER): Payer: Medicare Other

## 2020-08-02 ENCOUNTER — Telehealth: Payer: Self-pay | Admitting: Family Medicine

## 2020-08-02 DIAGNOSIS — Z0000 Encounter for general adult medical examination without abnormal findings: Secondary | ICD-10-CM

## 2020-08-02 DIAGNOSIS — R768 Other specified abnormal immunological findings in serum: Secondary | ICD-10-CM

## 2020-08-02 LAB — CALCIUM, IONIZED: Calcium, Ion: 5.73 mg/dL — ABNORMAL HIGH (ref 4.8–5.6)

## 2020-08-02 LAB — PTH, INTACT AND CALCIUM
Calcium: 10.4 mg/dL (ref 8.6–10.4)
PTH: 35 pg/mL (ref 14–64)

## 2020-08-02 LAB — RHEUMATOID FACTOR: Rheumatoid fact SerPl-aCnc: <14 IU/mL (ref <14)

## 2020-08-02 LAB — VITAMIN D 25 HYDROXY (VIT D DEFICIENCY, FRACTURES): Vit D, 25-Hydroxy: 25 ng/mL — ABNORMAL LOW (ref 30–100)

## 2020-08-02 LAB — ANA: Anti Nuclear Antibody (ANA): POSITIVE — AB

## 2020-08-02 LAB — ANGIOTENSIN CONVERTING ENZYME: Angiotensin-Converting Enzyme: 52 U/L (ref 9–67)

## 2020-08-02 LAB — ANTI-NUCLEAR AB-TITER (ANA TITER)
ANA TITER: 1:320 {titer} — ABNORMAL HIGH
ANA Titer 1: 1:80 {titer} — ABNORMAL HIGH

## 2020-08-02 LAB — CYCLIC CITRUL PEPTIDE ANTIBODY, IGG: Cyclic Citrullin Peptide Ab: <16 UNITS

## 2020-08-02 LAB — EXTRA LAV TOP TUBE

## 2020-08-02 LAB — TSH: TSH: 0.75 mIU/L (ref 0.40–4.50)

## 2020-08-02 NOTE — Telephone Encounter (Signed)
Pt calling for lab results, saw in [place] but having trouble understanding.

## 2020-08-02 NOTE — Progress Notes (Signed)
Subjective:   Kaylee Adams is a 69 y.o. female who presents for Medicare Annual (Subsequent) preventive examination.  I connected with Radene Journey  today by telephone and verified that I am speaking with the correct person using two identifiers. Location patient: home Location provider: work Persons participating in the virtual visit: patient, provider.   I discussed the limitations, risks, security and privacy concerns of performing an evaluation and management service by telephone and the availability of in person appointments. I also discussed with the patient that there may be a patient responsible charge related to this service. The patient expressed understanding and verbally consented to this telephonic visit.    Interactive audio and video telecommunications were attempted between this provider and patient, however failed, due to patient having technical difficulties OR patient did not have access to video capability.  We continued and completed visit with audio only.      Review of Systems    N/A Cardiac Risk Factors include: advanced age (>37men, >72 women);hypertension     Objective:    Today's Vitals   08/02/20 0818  PainSc: 4    There is no height or weight on file to calculate BMI.  Advanced Directives 08/02/2020 09/30/2017 01/11/2016 01/09/2016 11/03/2013 10/29/2013  Does Patient Have a Medical Advance Directive? No No No No Patient does not have advance directive;Patient would like information Patient does not have advance directive;Patient would like information  Would patient like information on creating a medical advance directive? No - Patient declined - No - patient declined information No - patient declined information - Advance directive packet given  Pre-existing out of facility DNR order (yellow form or pink MOST form) - - - - - No    Current Medications (verified) Outpatient Encounter Medications as of 08/02/2020  Medication Sig  . amLODipine (NORVASC) 10  MG tablet TAKE 1 TABLET BY MOUTH EVERY DAY  . aspirin 81 MG chewable tablet Chew by mouth daily.  Marland Kitchen gabapentin (NEURONTIN) 100 MG capsule Take 2 capsules (200 mg total) by mouth at bedtime.  . hydrochlorothiazide (HYDRODIURIL) 25 MG tablet TAKE 1 TABLET BY MOUTH EVERY DAY  . losartan (COZAAR) 100 MG tablet TAKE 1 TABLET BY MOUTH EVERY DAY  . MAGNESIUM PO Take by mouth daily.  . MULTIPLE VITAMIN PO Take 1 capsule by mouth daily.  Marland Kitchen OVER THE COUNTER MEDICATION Equate Allergy med prn  . Potassium Gluconate (SM POTASSIUM PO) Take by mouth.  . vitamin C (ASCORBIC ACID) 500 MG tablet Take 500 mg by mouth daily.   No facility-administered encounter medications on file as of 08/02/2020.    Allergies (verified) Lisinopril and Penicillins   History: Past Medical History:  Diagnosis Date  . Allergy    occ takes otc allergy pill  . CAD (coronary artery disease)    a. 11/2005 Cath: nonobs dzs, 30% LAD lesion on cath ;  b. 11/2005 Echo: NL EF;  c. 04/2012 Echo: EF 55-60%, Gr 2 DD;  d. 04/2012 Ex MV: EF 72%, ST depression in recovery with NL perfusion imaging - HTN response to exercise.  . Chest pressure "fatigue" 05/08/2012  . GERD (gastroesophageal reflux disease)   . Hepatic cyst    on ct [place]  . HLD (hyperlipidemia)   . HTN (hypertension)   . Midsternal chest pain    cardiology eval with non-ischemic stress test 04/2012  . Upper airway cough syndrome 04/29/2015   Trial of max gerd rx 04/29/2015 >>>     Past Surgical History:  Procedure Laterality Date  . ABDOMINAL HYSTERECTOMY     fibroid tumors  . BREAST BIOPSY Right   . BREAST EXCISIONAL BIOPSY Right   . child birth x 5    . CHOLECYSTECTOMY    . COLONOSCOPY  01/09/2008  . SHOULDER ARTHROSCOPY WITH ROTATOR CUFF REPAIR AND SUBACROMIAL DECOMPRESSION Right 11/03/2013   Procedure: RIGHT SHOULDER ARTHROSCOPY WITH DEBRIDEMENT, ROTATOR CUFF REPAIR AND SUBACROMIAL DECOMPRESSION, DISTAL CLAVICLE RESECTION;  Surgeon: Mcarthur Rossetti, MD;   Location: [facility];  Service: Orthopedics;  Laterality: Right;  Marland Kitchen VESICOVAGINAL FISTULA CLOSURE W/ TAH     for fibroid tumors   Family History  Problem Relation Age of Onset  . Breast cancer Sister   . Thyroid disease Sister   . Hypertension Mother   . Colon cancer Brother 75       dx'd about age 34 per pt-   . Colon polyps Neg Hx   . Esophageal cancer Neg Hx   . Rectal cancer Neg Hx   . Stomach cancer Neg Hx    Social History   Socioeconomic History  . Marital status: Married    Spouse name: Not on file  . Number of children: 5  . Years of education: Not on file  . Highest education level: Not on file  Occupational History    Employer: [employer]  Tobacco Use  . Smoking status: Never Smoker  . Smokeless tobacco: Never Used  Vaping Use  . Vaping Use: Never used  Substance and Sexual Activity  . Alcohol use: No  . Drug use: No  . Sexual activity: Not on file  Other Topics Concern  . Not on file  Social History Narrative   Married, full time Network engineer. 3 pets    [place]   6-8 hours sleep    Social Determinants of Health   Financial Resource Strain: Low Risk   . Difficulty of Paying Living Expenses: Not hard at all  Food Insecurity: No Food Insecurity  . Worried About Charity fundraiser in the Last Year: Never true  . Ran Out of Food in the Last Year: Never true  Transportation Needs: No Transportation Needs  . Lack of Transportation (Medical): No  . Lack of Transportation (Non-Medical): No  Physical Activity: Insufficiently Active  . Days of Exercise per Week: 3 days  . Minutes of Exercise per Session: 30 min  Stress: Stress Concern Present  . Feeling of Stress : To some extent  Social Connections: Moderately Integrated  . Frequency of Communication with Friends and Family: More than three times a week  . Frequency of Social Gatherings with Friends and Family: Three times a week  . Attends Religious Services: Never  . Active Member of Clubs or  Organizations: Yes  . Attends Archivist Meetings: More than 4 times per year  . Marital Status: Married    Tobacco Counseling Counseling given: Not Answered   Clinical Intake:  Pre-visit preparation completed: Yes  Pain : 0-10 Pain Score: 4  Pain Type: Chronic pain Pain Location: Back Pain Orientation: Lower Pain Descriptors / Indicators: Aching Pain Onset: More than a month ago Pain Frequency: Constant Pain Relieving Factors: Ibuprofen  Pain Relieving Factors: Ibuprofen  Nutritional Risks: None Diabetes: No  How often do you need to have someone help you when you read instructions, pamphlets, or other written materials from your doctor or pharmacy?: 1 - Never What is the last grade level you completed in school?: Associates Degree  Diabetic?No  Interpreter Needed?: No  Information entered by :: [place] of Daily Living In your present state of health, do you have any difficulty performing the following activities: 08/02/2020  Hearing? N  Vision? N  Difficulty concentrating or making decisions? N  Walking or climbing stairs? N  Dressing or bathing? N  Doing errands, shopping? N  Preparing Food and eating ? N  Using the Toilet? N  In the past six months, have you accidently leaked urine? Y  Comment Has occasional bladder leakage  Do you have problems with loss of bowel control? N  Managing your Medications? N  Managing your Finances? N  Housekeeping or managing your Housekeeping? N  Some recent data might be hidden    Patient Care Team: Panosh, Standley Brooking, MD as PCP - General Milus Banister, MD (Gastroenterology) Fay Records, MD (Cardiology)  Indicate any recent Medical Services you may have received from other than [facility] providers in the past year (date may be approximate).     Assessment:   This is a routine wellness examination for Tekela.  Hearing/Vision screen  Hearing Screening   125Hz  250Hz  500Hz  1000Hz  2000Hz  3000Hz   4000Hz  6000Hz  8000Hz   Right ear:           Left ear:           Vision Screening Comments: Patient gets annual eye exams   Dietary issues and exercise activities discussed: Current Exercise Habits: Home exercise routine, Type of exercise: walking, Time (Minutes): 30, Frequency (Times/Week): 3, Weekly Exercise (Minutes/Week): 90, Intensity: Mild, Exercise limited by: orthopedic condition(s)  Goals    . Patient Stated     I will continue to walk 3x per week for 30 minutes        Depression Screen PHQ 2/9 Scores 08/02/2020 02/26/2020 02/10/2019 02/07/2018 07/14/2014  PHQ - 2 Score 0 0 0 0 0  PHQ- 9 Score 0 - - - -    Fall Risk Fall Risk  08/02/2020 02/26/2020 07/24/2019 02/07/2018  Falls in the past year? 0 0 0 No  Comment - - Emmi Telephone Survey: data to providers prior to load -  Number falls in past yr: 0 0 - -  Injury with Fall? 0 0 - -  Risk for fall due to : No Fall Risks - - -  Follow up Falls evaluation completed;Falls prevention discussed Falls evaluation completed - -    Any stairs in or around the home? Yes  If so, are there any without handrails? Yes  Home free of loose throw rugs in walkways, pet beds, electrical cords, etc? No  Adequate lighting in your home to reduce risk of falls? No   ASSISTIVE DEVICES UTILIZED TO PREVENT FALLS:  Life alert? No  Use of a cane, walker or w/c? No  Grab bars in the bathroom? Yes  Shower chair or bench in shower? No  Elevated toilet seat or a handicapped toilet? Yes   Cognitive Function:  Cognitive screening not indicated based on direct observation       Immunizations Immunization History  Administered Date(s) Administered  . Influenza Split 10/26/2013  . Influenza, High Dose Seasonal PF 11/13/2018, 08/18/2019  . Influenza-Unspecified 10/08/2017  . PFIZER SARS-COV-2 Vaccination 03/14/2020, 04/06/2020  . PPD Test 03/03/2018  . Pneumococcal Conjugate-13 02/01/2017  . Pneumococcal Polysaccharide-23 02/07/2018  . Tdap 02/01/2017     TDAP status: Up to date Flu Vaccine status: Up to date Pneumococcal vaccine status: Up to date Covid-19 vaccine status: Completed vaccines  Qualifies for Shingles Vaccine? Yes   Zostavax completed No   Shingrix Completed?: No.    Education has been provided regarding the importance of this vaccine. Patient has been advised to call insurance company to determine out of pocket expense if they have not yet received this vaccine. Advised may also receive vaccine at local pharmacy or Health Dept. Verbalized acceptance and understanding.  Screening Tests Health Maintenance  Topic Date Due  . INFLUENZA VACCINE  07/24/2020  . MAMMOGRAM  02/22/2022  . COLONOSCOPY  09/09/2024  . TETANUS/TDAP  02/01/2027  . DEXA SCAN  Completed  . COVID-19 Vaccine  Completed  . Hepatitis C Screening  Completed  . PNA vac Low Risk Adult  Completed    Health Maintenance  Health Maintenance Due  Topic Date Due  . INFLUENZA VACCINE  07/24/2020    Colorectal cancer screening: Completed 09/10/2019. Repeat every 10 years Mammogram status: Completed 02/23/2020. Repeat every year Bone Density status: Completed 08/03/2019. Results reflect: Bone density results: OSTEOPENIA. Repeat every 3 years.  Lung Cancer Screening: (Low Dose CT Chest recommended if Age 73-80 years, 30 pack-year currently smoking OR have quit w/in 15years.) does not qualify.   Lung Cancer Screening Referral: N/A  Additional Screening:  Hepatitis C Screening: does qualify; Completed 07/05/2016  Vision Screening: Recommended annual ophthalmology exams for early detection of glaucoma and other disorders of the eye. Is the patient up to date with their annual eye exam?  Yes  Who is the provider or what is the name of the office in which the patient attends annual eye exams? Dr. Prudencio Burly If pt is not established with a provider, would they like to be referred to a provider to establish care? No .   Dental Screening: Recommended annual dental  exams for proper oral hygiene  Community Resource Referral / Chronic Care Management: CRR required this visit?  No   CCM required this visit?  No      Plan:     I have personally reviewed and noted the following in the patient's chart:   . Medical and social history . Use of alcohol, tobacco or illicit drugs  . Current medications and supplements . Functional ability and status . Nutritional status . Physical activity . Advanced directives . List of other physicians . Hospitalizations, surgeries, and ER visits in previous 12 months . Vitals . Screenings to include cognitive, depression, and falls . Referrals and appointments  In addition, I have reviewed and discussed with patient certain preventive protocols, quality metrics, and best practice recommendations. A written personalized care plan for preventive services as well as general preventive health recommendations were provided to patient.     Ofilia Neas, LPN   6/46/8032   Nurse Notes: None

## 2020-08-02 NOTE — Patient Instructions (Signed)
Kaylee Adams , Thank you for taking time to come for your Medicare Wellness Visit. I appreciate your ongoing commitment to your health goals. Please review the following plan we discussed and let me know if I can assist you in the future.   Screening recommendations/referrals: Colonoscopy: Up to date, next due 09/08/2029 Mammogram: Up to date, next due 02/22/2021 Bone Density: Up to date, next due 08/02/2022 Recommended yearly ophthalmology/optometry visit for glaucoma screening and checkup Recommended yearly dental visit for hygiene and checkup  Vaccinations: Influenza vaccine: Up to date, due this fall 2021 Pneumococcal vaccine: Completed series  Tdap vaccine: Up to date, next due 02/01/2027 Shingles vaccine: Currently due, please contact your pharmacy to discuss and to receive vaccines.    Advanced directives: Advance directive discussed with you today. Even though you declined this today please call our office should you change your mind and we can give you the proper paperwork for you to fill out.   Conditions/risks identified: None   Next appointment: 02/27/2021 @ 10:00 am with Dr. Regis Bill    Preventive Care 65 Years and Older, Female Preventive care refers to lifestyle choices and visits with your health care provider that can promote health and wellness. What does preventive care include?  A yearly physical exam. This is also called an annual well check.  Dental exams once or twice a year.  Routine eye exams. Ask your health care provider how often you should have your eyes checked.  Personal lifestyle choices, including:  Daily care of your teeth and gums.  Regular physical activity.  Eating a healthy diet.  Avoiding tobacco and drug use.  Limiting alcohol use.  Practicing safe sex.  Taking low-dose aspirin every day.  Taking vitamin and mineral supplements as recommended by your health care provider. What happens during an annual well check? The services and  screenings done by your health care provider during your annual well check will depend on your age, overall health, lifestyle risk factors, and family history of disease. Counseling  Your health care provider may ask you questions about your:  Alcohol use.  Tobacco use.  Drug use.  Emotional well-being.  Home and relationship well-being.  Sexual activity.  Eating habits.  History of falls.  Memory and ability to understand (cognition).  Work and work Statistician.  Reproductive health. Screening  You may have the following tests or measurements:  Height, weight, and BMI.  Blood pressure.  Lipid and cholesterol levels. These may be checked every 5 years, or more frequently if you are over 66 years old.  Skin check.  Lung cancer screening. You may have this screening every year starting at age 70 if you have a 30-pack-year history of smoking and currently smoke or have quit within the past 15 years.  Fecal occult blood test (FOBT) of the stool. You may have this test every year starting at age 65.  Flexible sigmoidoscopy or colonoscopy. You may have a sigmoidoscopy every 5 years or a colonoscopy every 10 years starting at age 48.  Hepatitis C blood test.  Hepatitis B blood test.  Sexually transmitted disease (STD) testing.  Diabetes screening. This is done by checking your blood sugar (glucose) after you have not eaten for a while (fasting). You may have this done every 1-3 years.  Bone density scan. This is done to screen for osteoporosis. You may have this done starting at age 64.  Mammogram. This may be done every 1-2 years. Talk to your health care provider about how  often you should have regular mammograms. Talk with your health care provider about your test results, treatment options, and if necessary, the need for more tests. Vaccines  Your health care provider may recommend certain vaccines, such as:  Influenza vaccine. This is recommended every  year.  Tetanus, diphtheria, and acellular pertussis (Tdap, Td) vaccine. You may need a Td booster every 10 years.  Zoster vaccine. You may need this after age 3.  Pneumococcal 13-valent conjugate (PCV13) vaccine. One dose is recommended after age 64.  Pneumococcal polysaccharide (PPSV23) vaccine. One dose is recommended after age 89. Talk to your health care provider about which screenings and vaccines you need and how often you need them. This information is not intended to replace advice given to you by your health care provider. Make sure you discuss any questions you have with your health care provider. Document Released: 01/06/2016 Document Revised: 08/29/2016 Document Reviewed: 10/11/2015 Elsevier Interactive Patient Education  2017 [place] Prevention in the Home Falls can cause injuries. They can happen to people of all ages. There are many things you can do to make your home safe and to help prevent falls. What can I do on the outside of my home?  Regularly fix the edges of walkways and driveways and fix any cracks.  Remove anything that might make you trip as you walk through a door, such as a raised step or threshold.  Trim any bushes or trees on the path to your home.  Use bright outdoor lighting.  Clear any walking paths of anything that might make someone trip, such as rocks or tools.  Regularly check to see if handrails are loose or broken. Make sure that both sides of any steps have handrails.  Any raised decks and porches should have guardrails on the edges.  Have any leaves, snow, or ice cleared regularly.  Use sand or salt on walking paths during winter.  Clean up any spills in your garage right away. This includes oil or grease spills. What can I do in the bathroom?  Use night lights.  Install grab bars by the toilet and in the tub and shower. Do not use towel bars as grab bars.  Use non-skid mats or decals in the tub or shower.  If you  need to sit down in the shower, use a plastic, non-slip stool.  Keep the floor dry. Clean up any water that spills on the floor as soon as it happens.  Remove soap buildup in the tub or shower regularly.  Attach bath mats securely with double-sided non-slip rug tape.  Do not have throw rugs and other things on the floor that can make you trip. What can I do in the bedroom?  Use night lights.  Make sure that you have a light by your bed that is easy to reach.  Do not use any sheets or blankets that are too big for your bed. They should not hang down onto the floor.  Have a firm chair that has side arms. You can use this for support while you get dressed.  Do not have throw rugs and other things on the floor that can make you trip. What can I do in the kitchen?  Clean up any spills right away.  Avoid walking on wet floors.  Keep items that you use a lot in easy-to-reach places.  If you need to reach something above you, use a strong step stool that has a grab bar.  Keep electrical  cords out of the way.  Do not use floor polish or wax that makes floors slippery. If you must use wax, use non-skid floor wax.  Do not have throw rugs and other things on the floor that can make you trip. What can I do with my stairs?  Do not leave any items on the stairs.  Make sure that there are handrails on both sides of the stairs and use them. Fix handrails that are broken or loose. Make sure that handrails are as long as the stairways.  Check any carpeting to make sure that it is firmly attached to the stairs. Fix any carpet that is loose or worn.  Avoid having throw rugs at the top or bottom of the stairs. If you do have throw rugs, attach them to the floor with carpet tape.  Make sure that you have a light switch at the top of the stairs and the bottom of the stairs. If you do not have them, ask someone to add them for you. What else can I do to help prevent falls?  Wear shoes  that:  Do not have high heels.  Have rubber bottoms.  Are comfortable and fit you well.  Are closed at the toe. Do not wear sandals.  If you use a stepladder:  Make sure that it is fully opened. Do not climb a closed stepladder.  Make sure that both sides of the stepladder are locked into place.  Ask someone to hold it for you, if possible.  Clearly mark and make sure that you can see:  Any grab bars or handrails.  First and last steps.  Where the edge of each step is.  Use tools that help you move around (mobility aids) if they are needed. These include:  Canes.  Walkers.  Scooters.  Crutches.  Turn on the lights when you go into a dark area. Replace any light bulbs as soon as they burn out.  Set up your furniture so you have a clear path. Avoid moving your furniture around.  If any of your floors are uneven, fix them.  If there are any pets around you, be aware of where they are.  Review your medicines with your doctor. Some medicines can make you feel dizzy. This can increase your chance of falling. Ask your doctor what other things that you can do to help prevent falls. This information is not intended to replace advice given to you by your health care provider. Make sure you discuss any questions you have with your health care provider. Document Released: 10/06/2009 Document Revised: 05/17/2016 Document Reviewed: 01/14/2015 Elsevier Interactive Patient Education  2017 [employer].

## 2020-08-05 MED ORDER — VITAMIN D (ERGOCALCIFEROL) 1.25 MG (50000 UNIT) PO CAPS
50000.0000 [IU] | ORAL_CAPSULE | ORAL | 0 refills | Status: DC
Start: 2020-08-05 — End: 2020-10-17

## 2020-08-05 NOTE — Telephone Encounter (Signed)
Discussed labs with pt & sent in ergocalciferol and entered rheumatology referral.

## 2020-08-16 ENCOUNTER — Other Ambulatory Visit: Payer: Self-pay | Admitting: Family Medicine

## 2020-08-16 DIAGNOSIS — M62561 Muscle wasting and atrophy, not elsewhere classified, right lower leg: Secondary | ICD-10-CM

## 2020-08-16 DIAGNOSIS — I739 Peripheral vascular disease, unspecified: Secondary | ICD-10-CM

## 2020-08-24 ENCOUNTER — Ambulatory Visit (HOSPITAL_COMMUNITY)
Admission: RE | Admit: 2020-08-24 | Discharge: 2020-08-24 | Disposition: A | Discharge status: Home / Self Care | Payer: Medicare Other | Source: Ambulatory Visit | Attending: Family Medicine | Admitting: Family Medicine

## 2020-08-24 ENCOUNTER — Other Ambulatory Visit: Payer: Self-pay

## 2020-08-24 DIAGNOSIS — I739 Peripheral vascular disease, unspecified: Secondary | ICD-10-CM | POA: Diagnosis not present

## 2020-08-24 DIAGNOSIS — M62561 Muscle wasting and atrophy, not elsewhere classified, right lower leg: Secondary | ICD-10-CM | POA: Diagnosis not present

## 2020-08-30 ENCOUNTER — Other Ambulatory Visit: Payer: Self-pay

## 2020-08-30 ENCOUNTER — Ambulatory Visit (INDEPENDENT_AMBULATORY_CARE_PROVIDER_SITE_OTHER): Payer: Medicare Other | Admitting: Neurology

## 2020-08-30 DIAGNOSIS — R94131 Abnormal electromyogram [EMG]: Secondary | ICD-10-CM

## 2020-08-30 DIAGNOSIS — M5410 Radiculopathy, site unspecified: Secondary | ICD-10-CM

## 2020-08-30 NOTE — Procedures (Signed)
[facility]  [place], [place]  [address], [place] [address] Tel: [phone] Fax:  [phone] Test Date:  08/30/2020  Patient: Kaylee Adams DOB: July 25, 1951 Physician: Narda Amber, DO  Sex: Female Height: 5\' 1"  Ref Phys: Hulan Saas, DO  ID#: [id number] Temp: 32.0C Technician:    Patient Complaints: This is a 69 year old female referred for evaluation of right leg pain and muscle atrophy.  NCV & EMG Findings: Extensive electrodiagnostic testing of the right lower extremity and additional studies of the left shows:  1. Bilateral sural and superficial peroneal sensory responses are within normal limits. 2. Bilateral peroneal and tibial motor responses are within normal limits. 3. Bilateral tibial H reflex studies are within normal limits. 4. 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  Fibrillation potentials are seen in the lumbar paraspinal muscles.  Similar findings are not seen in the left lower extremity.  Impression: 1. 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 2. There is no evidence of a sensorimotor polyneuropathy affecting the lower extremity.   ___________________________ Narda Amber, DO    Nerve Conduction Studies Anti Sensory Summary Table   Stim Site NR Peak (ms) Norm Peak (ms) P-T Amp (V) Norm P-T Amp  Left Sup Peroneal Anti Sensory (Ant Lat Mall)  32C  12 cm    2.1 <4.6 30.5 >3  Right Sup Peroneal Anti Sensory (Ant Lat Mall)  32C  12 cm    2.1 <4.6 25.1 >3  Left Sural Anti Sensory (Lat Mall)  32C  Calf    2.8 <4.6 29.3 >3  Right Sural Anti Sensory (Lat Mall)  32C  Calf    2.6 <4.6 31.7 >3   Motor Summary Table   Stim Site NR Onset (ms) Norm Onset (ms) O-P Amp (mV) Norm O-P Amp Site1 Site2 Delta-0 (ms) Dist (cm) Vel  (m/s) Norm Vel (m/s)  Left Peroneal Motor (Ext Dig Brev)  32C  Ankle    3.8 <6.0 10.5 >2.5 B Fib Ankle 5.7 32.0 56 >40  B Fib    9.5  8.9  Poplt B Fib 1.2 7.0 58 >40  Poplt    10.7  8.4         Right Peroneal Motor (Ext Dig Brev)  32C  Ankle    3.4 <6.0 9.8 >2.5 B Fib Ankle 6.1 33.0 54 >40  B Fib    9.5  8.7  Poplt B Fib 1.4 8.0 57 >40  Poplt    10.9  8.5         Left Peroneal TA Motor (Tib Ant)  32C  Fib Head    2.0 <4.5 5.9 >3 Poplit Fib Head 1.4 7.0 50 >40  Poplit    3.4  5.2         Right Peroneal TA Motor (Tib Ant)  32C  Fib Head    2.8 <4.5 5.2 >3 Poplit Fib Head 1.8 8.0 44 >40  Poplit    4.6  5.2         Left Tibial Motor (Abd Hall Brev)  32C  Ankle    2.7 <6.0 9.9 >4 Knee Ankle 8.1 34.0 42 >40  Knee    10.8  9.1         Right Tibial Motor (Abd Hall Brev)  32C  Ankle    1.9 <6.0  6.4 >4 Knee Ankle 8.3 35.0 42 >40  Knee    10.2  5.5          H Reflex Studies   NR H-Lat (ms) Lat Norm (ms) L-R H-Lat (ms)  Left Tibial (Gastroc)  32C     28.84 <35 2.72  Right Tibial (Gastroc)  32C     31.56 <35 2.72   EMG   Side Muscle Ins Act Fibs Psw Fasc Number Recrt Dur Dur. Amp Amp. Poly Poly. Comment  Right AdductorLong Nml Nml Nml Nml 2- Rapid Some 1+ Some 1+ Some 1+ N/A  Right GluteusMed Nml Nml Nml Nml 2- Rapid Most 1+ Most 1+ Most 1+ N/A  Right RectFemoris Nml Nml Nml Nml 1- Rapid Many 1+ Many 1+ Many 1+ N/A  Right BicepsFemS Nml Nml Nml Nml 3- Rapid Most 1+ Many 2+ Many 1+ N/A  Right Gastroc Nml Nml Nml Nml [facility] Rapid All 1+ All 1+ All 1+ ATR  Right Flex Dig Long Nml Nml Nml Nml 3- Rapid All 1+ All 2+ All 1+ N/A  Right AntTibialis Nml Nml Nml Nml 3- Rapid All 1+ All 2+ All 1+ ATR  Right Lumbo Parasp Low Nml 1+ Nml Nml NE - - - - - - - N/A  Left RectFemoris Nml Nml Nml Nml Nml Nml Nml Nml Nml Nml Nml Nml N/A  Left GluteusMed Nml Nml Nml Nml Nml Nml Nml Nml Nml Nml Nml Nml N/A  Left Gastroc Nml Nml Nml Nml Nml Nml Nml Nml Nml Nml Nml Nml N/A  Left AntTibialis Nml Nml Nml  Nml Nml Nml Nml Nml Nml Nml Nml Nml N/A  Left BicepsFemS Nml Nml Nml Nml Nml Nml Nml Nml Nml Nml Nml Nml N/A  Left Flex Dig Long Nml Nml Nml Nml Nml Nml Nml Nml Nml Nml Nml Nml N/A      Waveforms:

## 2020-08-30 NOTE — Progress Notes (Unsigned)
MRI lu

## 2020-09-03 ENCOUNTER — Ambulatory Visit
Admission: RE | Admit: 2020-09-03 | Discharge: 2020-09-03 | Disposition: A | Discharge status: Home / Self Care | Payer: 59 | Source: Ambulatory Visit | Attending: Family Medicine | Admitting: Family Medicine

## 2020-09-03 DIAGNOSIS — R94131 Abnormal electromyogram [EMG]: Secondary | ICD-10-CM

## 2020-09-03 DIAGNOSIS — M48061 Spinal stenosis, lumbar region without neurogenic claudication: Secondary | ICD-10-CM | POA: Diagnosis not present

## 2020-09-05 ENCOUNTER — Telehealth: Payer: Self-pay | Admitting: Internal Medicine

## 2020-09-05 ENCOUNTER — Other Ambulatory Visit: Payer: Self-pay

## 2020-09-05 DIAGNOSIS — R94130 Abnormal response to nerve stimulation, unspecified: Secondary | ICD-10-CM

## 2020-09-05 NOTE — Telephone Encounter (Signed)
Please see message. °

## 2020-09-05 NOTE — Telephone Encounter (Signed)
The patient contacted the office to see if Dr. Regis Bill can look at her labs that she had done by Hulan Saas to let her know if there is anything that she needs to worry about. He referred her to Rheumatology and she can't get in there til January and they are also sent her to neurology.  She was hoping to speak with Dr. Regis Bill about her concerns but I suggested that we can make an in office appointment or a virtual appointment so she can talk to Dr. Regis Bill more about her concerns and she just decided to just pray and let god do his work since she can't get Dr. Regis Bill to just call and talk to her about her labs.  Please advise

## 2020-09-05 NOTE — Telephone Encounter (Signed)
No alarming findings  On lab ordered by Dr Tamala Julian But positive serology antibodies that sometimes are associated with autoimmune causes of musculo skeletal cause of pain. Best determine by rheumatology to see if  Important  And if   other medicines advised    Nothing  In the labs look acutely serious to  Your health right now .  Lab tests  Do not diagnose just [place] to help [place] evaluate.. If  You get new symptoms or concerns  Should   Make appt for evaluation in interim.

## 2020-09-06 NOTE — Telephone Encounter (Signed)
Called patient and gave her the message from Dr. Panosh. Patient verbalized an understanding. 

## 2020-09-08 ENCOUNTER — Ambulatory Visit (INDEPENDENT_AMBULATORY_CARE_PROVIDER_SITE_OTHER): Payer: Medicare Other | Admitting: Neurology

## 2020-09-08 ENCOUNTER — Other Ambulatory Visit: Payer: Self-pay

## 2020-09-08 ENCOUNTER — Encounter: Payer: Self-pay | Admitting: Neurology

## 2020-09-08 VITALS — BP 117/70 | HR 76 | Ht 61.0 in | Wt 167.0 lb

## 2020-09-08 DIAGNOSIS — Z8612 Personal history of poliomyelitis: Secondary | ICD-10-CM | POA: Diagnosis not present

## 2020-09-08 NOTE — Patient Instructions (Signed)
Return to clinic as needed

## 2020-09-08 NOTE — Progress Notes (Signed)
Kaylee Adams - Initial Visit   Date: 09/08/20  Kaylee Adams MRN: [id number] DOB: 04/27/1951   [name] Although her history is well known to you, please allow [place] to reiterate it for the purpose of our medical record. The patient was accompanied to the clinic by self.    History of Present Illness: Kaylee Adams is a 69 y.o. right-handed female with hypertension presenting for evaluation of right leg weakness.   She has always had problems with her left leg and recalls having severe cramps since the age of 2. Over the past few years, her cramps have significantly improved.  She has some tightness in the calf and has been aware that the right leg has been smaller and very slightly weaker then the left leg for many years.  There has been no new weakness, tingling, or cramps. She wears arch support in the right foot because of high arch.  Upon further questioning, she tells me that when she was a child, she was being evaluated for poliomyelitis, but does not recall any details.  She was not in braces or hospitalized. She does not recall any details of birth history or developmental milestones.  Her primary issue which led to getting an EMG is associated with right ankle, knee, and hip pain.  She also had some relief with steroid injection last year and is scheduled to see rheumatology, but it her appointment is not until January.  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  Follow-up MRI lumbar spine shows spinal and lateral recess stenosis at L4-5 and L5-S1, which does not explain all of her EMG findings.   She has a brother with ALS (age 81) diagnosed at the age of 25.  He is quadriparetic and intermittently on a ventilator.    She has  arthritic pain in the ankle, knees, and hip.  She has some relief with steroid injection at the knee.   Out-side paper records, electronic medical record, and images have been reviewed where available and summarized as:  MRI lumbar spine 09/03/2020: 1. 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 2. Degenerative anterolisthesis of L4 and L5. 3. Moderate multifactorial spinal and bilateral lateral recess stenosis at L4-5. 4. Moderately severe spinal and bilateral lateral recess stenosis at L5-S1. There is also a shallow broad-based right-sided disc protrusion and right-sided facet spurring contributing to moderate right foraminal stenosis likely effecting the right L5 nerve root.  MRI cervical spine wo contrast 10/10/2019: Degenerative spondylosis throughout the cervical region as outlined above. Canal narrowing, most pronounced at C4-5 and C5-6 but no frank cord compression. Bilateral foraminal narrowing that could possibly cause neural compressive symptoms at the C4-5 level. Left foraminal encroachment at C7-T1 by osteophyte and disc material could focally compress the left C8 nerve. This may be the most significant finding.  NCS/EMG bilateral legs 08/30/2020: 1. 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 2. There is no evidence of a sensorimotor polyneuropathy affecting the lower extremity.   Lab Results  Component Value Date   HGBA1C 5.7 02/12/2008   Lab Results  Component Value Date   VITAMINB12 255 09/29/2019   Lab Results  Component Value Date  TSH 0.75 07/29/2020   Lab Results  Component Value Date   ESRSEDRATE 11 07/29/2020    Past Medical History:  Diagnosis Date  . Allergy    occ takes otc allergy pill  . CAD (coronary artery disease)    a. 11/2005 Cath: nonobs dzs, 30% LAD lesion on cath ;  b. 11/2005 Echo: NL EF;  c. 04/2012 Echo: EF 55-60%,  Gr 2 DD;  d. 04/2012 Ex MV: EF 72%, ST depression in recovery with NL perfusion imaging - HTN response to exercise.  . Chest pressure "fatigue" 05/08/2012  . GERD (gastroesophageal reflux disease)   . Hepatic cyst    on ct [place]  . HLD (hyperlipidemia)   . HTN (hypertension)   . Midsternal chest pain    cardiology eval with non-ischemic stress test 04/2012  . Upper airway cough syndrome 04/29/2015   Trial of max gerd rx 04/29/2015 >>>      Past Surgical History:  Procedure Laterality Date  . ABDOMINAL HYSTERECTOMY     fibroid tumors  . BREAST BIOPSY Right   . BREAST EXCISIONAL BIOPSY Right   . child birth x 5    . CHOLECYSTECTOMY    . COLONOSCOPY  01/09/2008  . SHOULDER ARTHROSCOPY WITH ROTATOR CUFF REPAIR AND SUBACROMIAL DECOMPRESSION Right 11/03/2013   Procedure: RIGHT SHOULDER ARTHROSCOPY WITH DEBRIDEMENT, ROTATOR CUFF REPAIR AND SUBACROMIAL DECOMPRESSION, DISTAL CLAVICLE RESECTION;  Surgeon: Mcarthur Rossetti, MD;  Location: [facility];  Service: Orthopedics;  Laterality: Right;  Marland Kitchen VESICOVAGINAL FISTULA CLOSURE W/ TAH     for fibroid tumors     Medications:  Outpatient Encounter Medications as of 09/08/2020  Medication Sig  . amLODipine (NORVASC) 10 MG tablet TAKE 1 TABLET BY MOUTH EVERY DAY  . aspirin 81 MG chewable tablet Chew by mouth daily.  Marland Kitchen gabapentin (NEURONTIN) 100 MG capsule Take 2 capsules (200 mg total) by mouth at bedtime.  . hydrochlorothiazide (HYDRODIURIL) 25 MG tablet TAKE 1 TABLET BY MOUTH EVERY DAY  . losartan (COZAAR) 100 MG tablet TAKE 1 TABLET BY MOUTH EVERY DAY  . MAGNESIUM PO Take by mouth daily.  . MULTIPLE VITAMIN PO Take 1 capsule by mouth daily.  Marland Kitchen OVER THE COUNTER MEDICATION Equate Allergy med prn  . vitamin C (ASCORBIC ACID) 500 MG tablet Take 500 mg by mouth daily.  . Vitamin D, Ergocalciferol, (DRISDOL) 1.25 MG (50000 UNIT) CAPS capsule Take 1 capsule (50,000 Units total) by mouth every 7 (seven) days.  . Potassium Gluconate (SM POTASSIUM PO) Take by  mouth. (Patient not taking: Reported on 09/08/2020)   No facility-administered encounter medications on file as of 09/08/2020.    Allergies:  Allergies  Allergen Reactions  . Lisinopril     REACTION: cough  . Penicillins Nausea Only    Family History: Family History  Problem Relation Age of Onset  . Breast cancer Sister   . Thyroid disease Sister   . Hypertension Mother   . Colon cancer Brother 3       dx'd about age 19 per pt-   . Colon polyps Neg Hx   . Esophageal cancer Neg Hx   . Rectal cancer Neg Hx   . Stomach cancer Neg Hx     Social History: Social History   Tobacco Use  . Smoking status: Never Smoker  . Smokeless tobacco: Never Used  Vaping Use  . Vaping Use: Never used  Substance Use Topics  . Alcohol use: No  . Drug use: No   Social History  Social History Narrative   Married, full time Network engineer. 3 pets    [place]   6-8 hours sleep    Right Handed   Lives in a two story home.    Drinks 1 cup of Coffee    Vital Signs:  BP 117/70   Pulse 76   Ht 5\' 1"  (1.549 m)   Wt 167 lb (75.8 kg)   SpO2 100%   BMI 31.55 kg/m   Neurological Exam: MENTAL STATUS including orientation to time, place, person, recent and remote memory, attention span and concentration, language, and fund of knowledge is normal.  Speech is not dysarthric.  CRANIAL NERVES: II:  No visual field defects.  III-IV-VI: Pupils equal round and reactive.  Normal conjugate, extra-ocular eye movements in all directions of gaze.  No nystagmus.  No ptosis.   VII:  Normal facial symmetry and movements.   VIII:  Normal hearing and vestibular function.   IX-X:  Normal palatal movement.   XI:  Normal shoulder shrug and head rotation.   XII:  Normal tongue strength and range of motion, no deviation or fasciculation.  MOTOR: Moderate loss of muscle bulk and atrophy in the right leg (thigh, lower leg, and foot).  Right pes cavus. No fasciculations or abnormal movements.  No pronator drift.    Upper Extremity:  Right  Left  Deltoid  5/5   5/5   Biceps  5/5   5/5   Triceps  5/5   5/5   Infraspinatus 5/5  5/5  Medial pectoralis 5/5  5/5  Wrist extensors  5/5   5/5   Wrist flexors  5/5   5/5   Finger extensors  5/5   5/5   Finger flexors  5/5   5/5   Dorsal interossei  5/5   5/5   Abductor pollicis  5/5   5/5   Tone (Ashworth scale)  0  0   Lower Extremity:  Right  Left  Hip flexors  5/5   5/5   Hip extensors  5/5   5/5   Adductor 5/5  5/5  Abductor 5/5  5/5  Knee flexors  5/5   5/5   Knee extensors  5/5   5/5   Dorsiflexors  5-/5   5/5   Plantarflexors  5-/5   5/5   Toe extensors  4/5   5/5   Toe flexors  4/5   5/5   Tone (Ashworth scale)  0  0   MSRs:  Right        Left                  brachioradialis 2+  2+  biceps 2+  2+  triceps 2+  2+  patellar 1+  2+  ankle jerk 1+  2+  Hoffman no  no  plantar response down  down   SENSORY:  Normal and symmetric perception of light touch, pinprick, vibration, and proprioception.    COORDINATION/GAIT: Normal finger-to- nose-finger.  Intact rapid alternating movements bilaterally.  Able to rise from a chair without using arms.  Gait narrow based and stable. Mild difficulty with stressed gait on the right leg.   IMPRESSION: Right leg muscle atrophy, longstanding. 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  MRI lumbar spine shows degenerative changes at L4-5 and L5-S1, but  I do not believe she is symptomatic from this.  Her arthritic pain involving the right hip, knee, and ankle may be stemming from compensatory changes in structure/form to adjust for right leg asymmetry/weakness.  No additional neurological testing is indicated.  She will follow-up with rheumatology as scheduled.  Continue supportive care.   Return to clinic as  needed  Total time spent reviewing records, interview, history/exam, documentation, counseling and coordination of care on day of encounter:  40 min   Thank you for allowing me to participate in patient's care.  If I can answer any additional questions, I would be pleased to do so.    Sincerely,    Brewer Hitchman K. Posey Pronto, DO

## 2020-09-13 DIAGNOSIS — Z23 Encounter for immunization: Secondary | ICD-10-CM | POA: Diagnosis not present

## 2020-10-14 ENCOUNTER — Other Ambulatory Visit: Payer: Self-pay | Admitting: Family Medicine

## 2020-10-25 ENCOUNTER — Telehealth (INDEPENDENT_AMBULATORY_CARE_PROVIDER_SITE_OTHER): Payer: Medicare Other | Admitting: Internal Medicine

## 2020-10-25 ENCOUNTER — Encounter: Payer: Self-pay | Admitting: Internal Medicine

## 2020-10-25 DIAGNOSIS — R0981 Nasal congestion: Secondary | ICD-10-CM

## 2020-10-25 DIAGNOSIS — J069 Acute upper respiratory infection, unspecified: Secondary | ICD-10-CM

## 2020-10-25 NOTE — Progress Notes (Signed)
Virtual Visit via Video Note  I connected with@ on 10/25/20 at  1:30 PM EDT by a video enabled telemedicine application and verified that I am speaking with the correct person using two identifiers. Location patient: home Location provider:work  office Persons participating in the virtual visit: patient, provider  [place] national recommendations  regarding COVID 19 pandemic   video visit is advised over in office visit for this patient.  Patient aware  of the limitations of evaluation and management by telemedicine and  availability of in person appointments. and agreed to proceed.   HPI: Kaylee Adams presents for video visit for new onset respiratory illness.  Stating that her sinuses were bothering her. no fever .  Had fatigue some nasal congestion sinus congestion and feel like fluid in the ear.  Also some minor cough. netti pot and vit c and rested in bed yesterday and feels better today.  No pain or shortness of breath.  She has been Covid immunized Her husband gets seasonal coughing illnesses twice a year recently had one was Covid negative and was given a cough medicine by his doctor and is getting better.   ROS: See pertinent positives and negatives per HPI.  Leg back pain better at this time   Past Medical History:  Diagnosis Date  . Allergy    occ takes otc allergy pill  . CAD (coronary artery disease)    a. 11/2005 Cath: nonobs dzs, 30% LAD lesion on cath ;  b. 11/2005 Echo: NL EF;  c. 04/2012 Echo: EF 55-60%, Gr 2 DD;  d. 04/2012 Ex MV: EF 72%, ST depression in recovery with NL perfusion imaging - HTN response to exercise.  . Chest pressure "fatigue" 05/08/2012  . GERD (gastroesophageal reflux disease)   . Hepatic cyst    on ct [place]  . HLD (hyperlipidemia)   . HTN (hypertension)   . Midsternal chest pain    cardiology eval with non-ischemic stress test 04/2012  . Upper airway cough syndrome 04/29/2015   Trial of max gerd rx 04/29/2015 >>>      Past Surgical History:   Procedure Laterality Date  . ABDOMINAL HYSTERECTOMY     fibroid tumors  . BREAST BIOPSY Right   . BREAST EXCISIONAL BIOPSY Right   . child birth x 5    . CHOLECYSTECTOMY    . COLONOSCOPY  01/09/2008  . SHOULDER ARTHROSCOPY WITH ROTATOR CUFF REPAIR AND SUBACROMIAL DECOMPRESSION Right 11/03/2013   Procedure: RIGHT SHOULDER ARTHROSCOPY WITH DEBRIDEMENT, ROTATOR CUFF REPAIR AND SUBACROMIAL DECOMPRESSION, DISTAL CLAVICLE RESECTION;  Surgeon: Mcarthur Rossetti, MD;  Location: [facility];  Service: Orthopedics;  Laterality: Right;  Marland Kitchen VESICOVAGINAL FISTULA CLOSURE W/ TAH     for fibroid tumors    Family History  Problem Relation Age of Onset  . Breast cancer Sister   . Thyroid disease Sister   . Hypertension Mother   . Colon cancer Brother 101       dx'd about age 70 per pt-   . Colon polyps Neg Hx   . Esophageal cancer Neg Hx   . Rectal cancer Neg Hx   . Stomach cancer Neg Hx     Social History   Tobacco Use  . Smoking status: Never Smoker  . Smokeless tobacco: Never Used  Vaping Use  . Vaping Use: Never used  Substance Use Topics  . Alcohol use: No  . Drug use: No      Current Outpatient Medications:  .  amLODipine (NORVASC) 10  MG tablet, TAKE 1 TABLET BY MOUTH EVERY DAY, Disp: 90 tablet, Rfl: 3 .  aspirin 81 MG chewable tablet, Chew by mouth daily., Disp: , Rfl:  .  hydrochlorothiazide (HYDRODIURIL) 25 MG tablet, TAKE 1 TABLET BY MOUTH EVERY DAY, Disp: 90 tablet, Rfl: 3 .  losartan (COZAAR) 100 MG tablet, TAKE 1 TABLET BY MOUTH EVERY DAY, Disp: 90 tablet, Rfl: 3 .  MAGNESIUM PO, Take by mouth daily., Disp: , Rfl:  .  MULTIPLE VITAMIN PO, Take 1 capsule by mouth daily., Disp: , Rfl:  .  OVER THE COUNTER MEDICATION, Equate Allergy med prn, Disp: , Rfl:  .  Potassium Gluconate (SM POTASSIUM PO), Take by mouth. , Disp: , Rfl:  .  vitamin C (ASCORBIC ACID) 500 MG tablet, Take 500 mg by mouth daily., Disp: , Rfl:  .  Vitamin D, Ergocalciferol, (DRISDOL) 1.25 MG (50000 UNIT)  CAPS capsule, TAKE 1 CAPSULE (50,000 UNITS TOTAL) BY MOUTH EVERY 7 (SEVEN) DAYS., Disp: 12 capsule, Rfl: 0 .  gabapentin (NEURONTIN) 100 MG capsule, Take 2 capsules (200 mg total) by mouth at bedtime. (Patient not taking: Reported on 10/25/2020), Disp: 180 capsule, Rfl: 3  EXAM: BP Readings from Last 3 Encounters:  09/08/20 117/70  07/29/20 130/74  06/16/20 (!) 150/90    VITALS per patient if applicable: 283/15  GENERAL: alert, oriented, appears well and in no acute distress appears to have mild congestion nontoxic no respiratory distress. HEENT: atraumatic, conjunttiva clear, no obvious abnormalities on inspection of external nose and ears mild congestion  NECK: normal movements of the head and neck LUNGS: on inspection no signs of respiratory distress, breathing rate appears normal, no obvious gross SOB, gasping or wheezing CV: no obvious cyanosis  PSYCH/NEURO: pleasant and cooperative, no obvious depression or anxiety, speech and thought processing grossly intact Lab Results  Component Value Date   WBC 4.8 07/29/2020   HGB 13.4 07/29/2020   HCT 41.7 07/29/2020   PLT 388 07/29/2020   GLUCOSE 95 07/29/2020   CHOL 239 (H) 03/03/2020   TRIG 129.0 03/03/2020   HDL 53.20 03/03/2020   LDLDIRECT 153.2 06/23/2012   LDLCALC 160 (H) 03/03/2020   ALT 24 07/29/2020   AST 21 07/29/2020   NA 142 07/29/2020   K 4.0 07/29/2020   CL 106 07/29/2020   CREATININE 0.69 07/29/2020   BUN 15 07/29/2020   CO2 25 07/29/2020   TSH 0.75 07/29/2020   INR 0.9 11/29/2008   HGBA1C 5.7 02/12/2008    ASSESSMENT AND PLAN:  Discussed the following assessment and plan:    ICD-10-CM   1. Nasal sinus congestion  R09.81    prob viral  supportive care and fu wiit alarm sx consider covid test but husbands neg for prev illness supports common virus in community  2. URI, acute  J06.9    Symptomatic comfort treatments suspect viral self-limiting.  Consider getting Covid tested but since has been was  negative not absolutely necessary.  Unless progressing prolonged or worsening. Expectant management and follow-up if alarm symptoms relapsing etc. Counseled.   Expectant management and discussion of plan and treatment with opportunity to ask questions and all were answered. The patient agreed with the plan and demonstrated an understanding of the instructions.   Advised to call back or seek an in-person evaluation if worsening  or having  further concerns . Return if symptoms worsen or fail to improve as expected.Shanon Ace, MD

## 2020-11-16 ENCOUNTER — Telehealth: Payer: Self-pay | Admitting: Internal Medicine

## 2020-11-16 ENCOUNTER — Encounter: Payer: Self-pay | Admitting: Internal Medicine

## 2020-11-16 ENCOUNTER — Telehealth (INDEPENDENT_AMBULATORY_CARE_PROVIDER_SITE_OTHER): Payer: Medicare Other | Admitting: Internal Medicine

## 2020-11-16 ENCOUNTER — Other Ambulatory Visit: Payer: Self-pay

## 2020-11-16 VITALS — BP 132/65 | Ht 61.0 in | Wt 162.0 lb

## 2020-11-16 DIAGNOSIS — U071 COVID-19: Secondary | ICD-10-CM | POA: Diagnosis not present

## 2020-11-16 DIAGNOSIS — R051 Acute cough: Secondary | ICD-10-CM | POA: Diagnosis not present

## 2020-11-16 DIAGNOSIS — Z20822 Contact with and (suspected) exposure to covid-19: Secondary | ICD-10-CM | POA: Diagnosis not present

## 2020-11-16 NOTE — Progress Notes (Signed)
Patient presenting with cough, ongoing for 5 days. Also having fatigue.   No one around the Patient is sick, has not been COVID or flu tested.

## 2020-11-16 NOTE — Progress Notes (Signed)
Virtual Visit via Video Note  I connected with@ on 11/16/20 at 12:30 PM EST by a video enabled telemedicine application and verified that I am speaking with the correct person using two identifiers. Location patient: home Location provider:work  office Persons participating in the virtual visit: patient, provider  [place] national recommendations  regarding COVID 19 pandemic   video visit is advised over in office visit for this patient.  Patient aware  of the limitations of evaluation and management by telemedicine and  availability of in person appointments. and agreed to proceed.   HPI: Kaylee Adams presents for video visit sda   Cough  For 5 days  Some drainage    Not from nose .  No real shortness of breath or fever but does have extreme fatigue.  Not wanting to do anything. Some  Up at times     At night some production No one exposed in the household coughing no young children. Has had initial Covid vaccination [place] last 24 March 2020.  Only other exposure could be of been raking leaves. ROS: See pertinent positives and negatives per HPI.  Past Medical History:  Diagnosis Date   Allergy    occ takes otc allergy pill   CAD (coronary artery disease)    a. 11/2005 Cath: nonobs dzs, 30% LAD lesion on cath ;  b. 11/2005 Echo: NL EF;  c. 04/2012 Echo: EF 55-60%, Gr 2 DD;  d. 04/2012 Ex MV: EF 72%, ST depression in recovery with NL perfusion imaging - HTN response to exercise.   Chest pressure "fatigue" 05/08/2012   GERD (gastroesophageal reflux disease)    Hepatic cyst    on ct [place]   HLD (hyperlipidemia)    HTN (hypertension)    Midsternal chest pain    cardiology eval with non-ischemic stress test 04/2012   Upper airway cough syndrome 04/29/2015   Trial of max gerd rx 04/29/2015 >>>      Past Surgical History:  Procedure Laterality Date   ABDOMINAL HYSTERECTOMY     fibroid tumors   BREAST BIOPSY Right    BREAST EXCISIONAL BIOPSY Right    child birth x 5      CHOLECYSTECTOMY     COLONOSCOPY  01/09/2008   SHOULDER ARTHROSCOPY WITH ROTATOR CUFF REPAIR AND SUBACROMIAL DECOMPRESSION Right 11/03/2013   Procedure: RIGHT SHOULDER ARTHROSCOPY WITH DEBRIDEMENT, ROTATOR CUFF REPAIR AND SUBACROMIAL DECOMPRESSION, DISTAL CLAVICLE RESECTION;  Surgeon: Mcarthur Rossetti, MD;  Location: [facility];  Service: Orthopedics;  Laterality: Right;   VESICOVAGINAL FISTULA CLOSURE W/ TAH     for fibroid tumors    Family History  Problem Relation Age of Onset   Breast cancer Sister    Thyroid disease Sister    Hypertension Mother    Colon cancer Brother 80       dx'd about age 57 per pt-    Colon polyps Neg Hx    Esophageal cancer Neg Hx    Rectal cancer Neg Hx    Stomach cancer Neg Hx     Social History   Tobacco Use   Smoking status: Never Smoker   Smokeless tobacco: Never Used  Vaping Use   Vaping Use: Never used  Substance Use Topics   Alcohol use: No   Drug use: No      Current Outpatient Medications:    amLODipine (NORVASC) 10 MG tablet, TAKE 1 TABLET BY MOUTH EVERY DAY, Disp: 90 tablet, Rfl: 3   aspirin 81 MG chewable tablet, Chew by  mouth daily., Disp: , Rfl:    cetirizine (ZYRTEC) 10 MG tablet, Take 10 mg by mouth daily., Disp: , Rfl:    hydrochlorothiazide (HYDRODIURIL) 25 MG tablet, TAKE 1 TABLET BY MOUTH EVERY DAY, Disp: 90 tablet, Rfl: 3   losartan (COZAAR) 100 MG tablet, TAKE 1 TABLET BY MOUTH EVERY DAY, Disp: 90 tablet, Rfl: 3   MAGNESIUM PO, Take by mouth daily., Disp: , Rfl:    MULTIPLE VITAMIN PO, Take 1 capsule by mouth daily., Disp: , Rfl:    vitamin C (ASCORBIC ACID) 500 MG tablet, Take 500 mg by mouth daily., Disp: , Rfl:    Vitamin D, Ergocalciferol, (DRISDOL) 1.25 MG (50000 UNIT) CAPS capsule, TAKE 1 CAPSULE (50,000 UNITS TOTAL) BY MOUTH EVERY 7 (SEVEN) DAYS., Disp: 12 capsule, Rfl: 0   gabapentin (NEURONTIN) 100 MG capsule, Take 2 capsules (200 mg total) by mouth at bedtime. (Patient not taking:  Reported on 10/25/2020), Disp: 180 capsule, Rfl: 3   OVER THE COUNTER MEDICATION, Equate Allergy med prn (Patient not taking: Reported on 11/16/2020), Disp: , Rfl:    Potassium Gluconate (SM POTASSIUM PO), Take by mouth.  (Patient not taking: Reported on 11/16/2020), Disp: , Rfl:   EXAM: BP Readings from Last 3 Encounters:  11/16/20 132/65  09/08/20 117/70  07/29/20 130/74    VITALS per patient if applicable:  GENERAL: alert, oriented, appears well and in no acute distress she is nontoxic occasional bronchial cough but no dyspnea noted.  Good color  HEENT: atraumatic, conjunttiva clear, no obvious abnormalities on inspection of external nose and ears  NECK: normal movements of the head and neck  LUNGS: on inspection no signs of respiratory distress, breathing rate appears normal, no obvious gross SOB, gasping or wheezing  CV: no obvious cyanosis  PSYCH/NEURO: pleasant and cooperative, no obvious depression or anxiety, speech and thought processing grossly intact Lab Results  Component Value Date   WBC 4.8 07/29/2020   HGB 13.4 07/29/2020   HCT 41.7 07/29/2020   PLT 388 07/29/2020   GLUCOSE 95 07/29/2020   CHOL 239 (H) 03/03/2020   TRIG 129.0 03/03/2020   HDL 53.20 03/03/2020   LDLDIRECT 153.2 06/23/2012   LDLCALC 160 (H) 03/03/2020   ALT 24 07/29/2020   AST 21 07/29/2020   NA 142 07/29/2020   K 4.0 07/29/2020   CL 106 07/29/2020   CREATININE 0.69 07/29/2020   BUN 15 07/29/2020   CO2 25 07/29/2020   TSH 0.75 07/29/2020   INR 0.9 11/29/2008   HGBA1C 5.7 02/12/2008    ASSESSMENT AND PLAN:  Discussed the following assessment and plan:    ICD-10-CM   1. Acute cough  R05.1     Counseled.  Get covid testing for break through poss as id candidate for  mcaby interventions etc  Otherwise prob viral and run its course  But reach out for alarms  Or  persistent or progressive  In interim mucinex DM  , hot liquids honey etc for comfort   Expectant management and  discussion of plan and treatment with opportunity to ask questions and all were answered. The patient agreed with the plan and demonstrated an understanding of the instructions.  cough can last 10 - 14 days but feel better  Within a week Advised to call back or seek an in-person evaluation if worsening  or having  further concerns . Return if symptoms worsen or fail to improve as expected.    Shanon Ace, MD

## 2020-11-16 NOTE — Telephone Encounter (Signed)
Discussed with patient she had a positive Covid test at [facility]. Discussed the monoclonal antibody infusion she is about day 5 or more into her symptoms. Discussed alarm symptoms that would take her to emergent care. At this time yes she is interested in monoclonal antibody infusion and reviewed risk benefit of this.  And that I would recommend this in her situation.  We will do referral.

## 2020-11-16 NOTE — Telephone Encounter (Signed)
Please advise 

## 2020-11-16 NOTE — Telephone Encounter (Signed)
Pt is calling back stating that she did the COVID test and it was positive and wanted to know what she should do at this point.  Pt would like to have a call back.

## 2020-11-17 ENCOUNTER — Encounter: Payer: Self-pay | Admitting: Nurse Practitioner

## 2020-11-17 ENCOUNTER — Other Ambulatory Visit: Payer: Self-pay | Admitting: Nurse Practitioner

## 2020-11-17 DIAGNOSIS — U071 COVID-19: Secondary | ICD-10-CM

## 2020-11-17 NOTE — Progress Notes (Signed)
I connected by phone with [name] on 11/17/2020 at 8:28 AM to discuss the potential use of a treatment for mild to moderate COVID-19 viral infection in non-hospitalized patients.  This patient is a 69 y.o. female that meets the FDA criteria for Emergency Use Authorization of bamlanivimab/etesevimab, casirivimab\imdevimab, or sotrovimab  Has a (+) direct SARS-CoV-2 viral test result  Has mild or moderate COVID-19   Is ? 68 years of age and weighs ? 40 kg  Is NOT hospitalized due to COVID-19  Is NOT requiring oxygen therapy or requiring an increase in baseline oxygen flow rate due to COVID-19  Is within 10 days of symptom onset  Has at least one of the high risk factor(s) for progression to severe COVID-19 and/or hospitalization as defined in EUA.  Specific high risk criteria : Older age (>/= 68 yo), BMI > 25, Cardiovascular disease or hypertension and Other high risk medical condition per CDC:  SVI   I have spoken and communicated the following to the patient or parent/caregiver:  1. FDA has authorized the emergency use of bamlanivimab/etesevimab, casirivimab\imdevimab, or sotrovimab for the treatment of mild to moderate COVID-19 in adults and pediatric patients with positive results of direct SARS-CoV-2 viral testing who are 49 years of age and older weighing at least 40 kg, and who are at high risk for progressing to severe COVID-19 and/or hospitalization.  2. The significant known and potential risks and benefits of bamlanivimab/etesevimab, casirivimab\imdevimab, or sotrovimab, and the extent to which such potential risks and benefits are unknown.  3. Information on available alternative treatments and the risks and benefits of those alternatives, including clinical trials.  4. Patients treated with bamlanivimab/etesevimab, casirivimab\imdevimab, or sotrovimab should continue to self-isolate and use infection control measures (e.g., wear mask, isolate, social distance, avoid  sharing personal items, clean and disinfect "high touch" surfaces, and frequent handwashing) according to CDC guidelines.   5. The patient or parent/caregiver has the option to accept or refuse bamlanivimab/etesevimab, casirivimab\imdevimab, or sotrovimab.  After reviewing this information with the patient, the patient has agreed to receive one of the available covid 19 monoclonal antibodies and will be provided an appropriate fact sheet prior to infusion.Beckey Rutter, [place], AGNP-C [phone] ([place])

## 2020-11-18 ENCOUNTER — Ambulatory Visit (HOSPITAL_COMMUNITY)
Admission: RE | Admit: 2020-11-18 | Discharge: 2020-11-18 | Disposition: A | Discharge status: Home / Self Care | Payer: Medicare Other | Source: Ambulatory Visit | Attending: Pulmonary Disease | Admitting: Pulmonary Disease

## 2020-11-18 DIAGNOSIS — R54 Age-related physical debility: Secondary | ICD-10-CM | POA: Insufficient documentation

## 2020-11-18 DIAGNOSIS — I10 Essential (primary) hypertension: Secondary | ICD-10-CM | POA: Insufficient documentation

## 2020-11-18 DIAGNOSIS — Z23 Encounter for immunization: Secondary | ICD-10-CM | POA: Diagnosis not present

## 2020-11-18 DIAGNOSIS — U071 COVID-19: Secondary | ICD-10-CM | POA: Diagnosis not present

## 2020-11-18 MED ORDER — ALBUTEROL SULFATE HFA 108 (90 BASE) MCG/ACT IN AERS: 2.0000 | INHALATION_SPRAY | Freq: Once | RESPIRATORY_TRACT | Status: DC | PRN

## 2020-11-18 MED ORDER — METHYLPREDNISOLONE SODIUM SUCC 125 MG IJ SOLR: 125.0000 mg | Freq: Once | INTRAMUSCULAR | Status: DC | PRN

## 2020-11-18 MED ORDER — SODIUM CHLORIDE 0.9 % IV SOLN: INTRAVENOUS | Status: DC | PRN

## 2020-11-18 MED ORDER — SOTROVIMAB 500 MG/8ML IV SOLN
500.0000 mg | Freq: Once | INTRAVENOUS | Status: AC
  Administered 2020-11-18: 500 mg via INTRAVENOUS

## 2020-11-18 MED ORDER — EPINEPHRINE 0.3 MG/0.3ML IJ SOAJ: 0.3000 mg | Freq: Once | INTRAMUSCULAR | Status: DC | PRN

## 2020-11-18 MED ORDER — DIPHENHYDRAMINE HCL 50 MG/ML IJ SOLN: 50.0000 mg | Freq: Once | INTRAMUSCULAR | Status: DC | PRN

## 2020-11-18 MED ORDER — FAMOTIDINE IN NACL 20-0.9 MG/50ML-% IV SOLN: 20.0000 mg | Freq: Once | INTRAVENOUS | Status: DC | PRN

## 2020-11-18 NOTE — Discharge Instructions (Signed)

## 2020-11-18 NOTE — Progress Notes (Signed)
Diagnosis: COVID-19  Physician: Dr. Patrick Wright  Procedure: Covid Infusion Clinic Med: Sotrovimab infusion - Provided patient with sotrovimab fact sheet for patients, parents, and caregivers prior to infusion.   Complications: No immediate complications noted  Discharge: Discharged home    

## 2020-11-18 NOTE — Progress Notes (Signed)
Patient reviewed Fact Sheet for Patients, Parents, and Caregivers for Emergency Use Authorization (EUA) of Sotrovimab for the Treatment of Coronavirus. Patient also reviewed and is agreeable to the estimated cost of treatment. Patient is agreeable to proceed.   

## 2020-11-25 ENCOUNTER — Encounter: Payer: Self-pay | Admitting: Internal Medicine

## 2020-11-25 ENCOUNTER — Telehealth (INDEPENDENT_AMBULATORY_CARE_PROVIDER_SITE_OTHER): Payer: Medicare Other | Admitting: Internal Medicine

## 2020-11-25 VITALS — BP 103/62 | HR 82

## 2020-11-25 DIAGNOSIS — Z79899 Other long term (current) drug therapy: Secondary | ICD-10-CM

## 2020-11-25 DIAGNOSIS — G479 Sleep disorder, unspecified: Secondary | ICD-10-CM

## 2020-11-25 DIAGNOSIS — U071 COVID-19: Secondary | ICD-10-CM | POA: Diagnosis not present

## 2020-11-25 NOTE — Progress Notes (Signed)
Virtual Visit via Video Note  I connected with@ on 11/25/20 at  9:00 AM EST by a video enabled telemedicine application and verified that I am speaking with the correct person using two identifiers. Location patient: home Location provider:work  office Persons participating in the virtual visit: patient, provider  [place]th national recommendations  regarding COVID 19 pandemic   video visit is advised over in office visit for this patient.  Patient aware  of the limitations of evaluation and management by telemedicine and  availability of in person appointments. and agreed to proceed.   HPI: Kaylee Adams presents for video visit follow-up COVID-19 breakthrough infection and difficulty sleeping. She received infusion for breakthrough Covid respiratory infection on Friday, November 26.  It did help her feel better she has no fever significant cough shortness of breath however her sleep has been irregular and nonrestorative. Asked for advice. She took melatonin 10 mg last night and slept much better. She has lost 7 pounds but is forcing herself to eat. She has had multiple losses in the last 3 to 6 months but feels that she is coping pretty well.  Her brother who she had not physical close contact with past in October from a Covid.  Other issues her leg pain radicular pain got a lot better on gabapentin 200 mg at night and high-dose vitamin D she is off of those grateful that it feels so much better.  ROS: See pertinent positives and negatives per HPI.  Past Medical History:  Diagnosis Date  . Allergy    occ takes otc allergy pill  . CAD (coronary artery disease)    a. 11/2005 Cath: nonobs dzs, 30% LAD lesion on cath ;  b. 11/2005 Echo: NL EF;  c. 04/2012 Echo: EF 55-60%, Gr 2 DD;  d. 04/2012 Ex MV: EF 72%, ST depression in recovery with NL perfusion imaging - HTN response to exercise.  . Chest pressure "fatigue" 05/08/2012  . GERD (gastroesophageal reflux disease)   . Hepatic cyst    on ct [place]   . HLD (hyperlipidemia)   . HTN (hypertension)   . Midsternal chest pain    cardiology eval with non-ischemic stress test 04/2012  . Upper airway cough syndrome 04/29/2015   Trial of max gerd rx 04/29/2015 >>>      Past Surgical History:  Procedure Laterality Date  . ABDOMINAL HYSTERECTOMY     fibroid tumors  . BREAST BIOPSY Right   . BREAST EXCISIONAL BIOPSY Right   . child birth x 5    . CHOLECYSTECTOMY    . COLONOSCOPY  01/09/2008  . SHOULDER ARTHROSCOPY WITH ROTATOR CUFF REPAIR AND SUBACROMIAL DECOMPRESSION Right 11/03/2013   Procedure: RIGHT SHOULDER ARTHROSCOPY WITH DEBRIDEMENT, ROTATOR CUFF REPAIR AND SUBACROMIAL DECOMPRESSION, DISTAL CLAVICLE RESECTION;  Surgeon: Mcarthur Rossetti, MD;  Location: [employer];  Service: Orthopedics;  Laterality: Right;  Marland Kitchen VESICOVAGINAL FISTULA CLOSURE W/ TAH     for fibroid tumors    Family History  Problem Relation Age of Onset  . Breast cancer Sister   . Thyroid disease Sister   . Hypertension Mother   . Colon cancer Brother 62       dx'd about age 55 per pt-   . Colon polyps Neg Hx   . Esophageal cancer Neg Hx   . Rectal cancer Neg Hx   . Stomach cancer Neg Hx     Social History   Tobacco Use  . Smoking status: Never Smoker  . Smokeless tobacco: Never  Used  Vaping Use  . Vaping Use: Never used  Substance Use Topics  . Alcohol use: No  . Drug use: No      Current Outpatient Medications:  .  amLODipine (NORVASC) 10 MG tablet, TAKE 1 TABLET BY MOUTH EVERY DAY, Disp: 90 tablet, Rfl: 3 .  aspirin 81 MG chewable tablet, Chew by mouth daily., Disp: , Rfl:  .  Cyanocobalamin (VITAMIN B-12 PO), Take 5,000 mcg by mouth. Liquid, Disp: , Rfl:  .  hydrochlorothiazide (HYDRODIURIL) 25 MG tablet, TAKE 1 TABLET BY MOUTH EVERY DAY, Disp: 90 tablet, Rfl: 3 .  losartan (COZAAR) 100 MG tablet, TAKE 1 TABLET BY MOUTH EVERY DAY, Disp: 90 tablet, Rfl: 3 .  MAGNESIUM PO, Take by mouth daily., Disp: , Rfl:  .  MULTIPLE VITAMIN PO, Take 1 capsule  by mouth daily., Disp: , Rfl:  .  OVER THE COUNTER MEDICATION, Equate Allergy med prn , Disp: , Rfl:  .  vitamin C (ASCORBIC ACID) 500 MG tablet, Take 500 mg by mouth daily., Disp: , Rfl:  .  Vitamin D, Ergocalciferol, (DRISDOL) 1.25 MG (50000 UNIT) CAPS capsule, TAKE 1 CAPSULE (50,000 UNITS TOTAL) BY MOUTH EVERY 7 (SEVEN) DAYS., Disp: 12 capsule, Rfl: 0 .  cetirizine (ZYRTEC) 10 MG tablet, Take 10 mg by mouth daily. (Patient not taking: Reported on 11/25/2020), Disp: , Rfl:  .  gabapentin (NEURONTIN) 100 MG capsule, Take 2 capsules (200 mg total) by mouth at bedtime. (Patient not taking: Reported on 10/25/2020), Disp: 180 capsule, Rfl: 3 .  Potassium Gluconate (SM POTASSIUM PO), Take by mouth.  (Patient not taking: Reported on 11/16/2020), Disp: , Rfl:   EXAM: BP Readings from Last 3 Encounters:  11/25/20 103/62  11/18/20 (!) 127/56  11/16/20 132/65    VITALS per patient if applicable:  GENERAL: alert, oriented, appears well and in no acute distress  HEENT: atraumatic, conjunttiva clear, no obvious abnormalities on inspection of external nose and ears  NECK: normal movements of the head and neck  LUNGS: on inspection no signs of respiratory distress, breathing rate appears normal, no obvious gross SOB, gasping or wheezing  CV: no obvious cyanosis  MS: moves all visible extremities without noticeable abnormality  PSYCH/NEURO: pleasant and cooperative, no obvious depression or anxiety, speech and thought processing grossly intact Lab Results  Component Value Date   WBC 4.8 07/29/2020   HGB 13.4 07/29/2020   HCT 41.7 07/29/2020   PLT 388 07/29/2020   GLUCOSE 95 07/29/2020   CHOL 239 (H) 03/03/2020   TRIG 129.0 03/03/2020   HDL 53.20 03/03/2020   LDLDIRECT 153.2 06/23/2012   LDLCALC 160 (H) 03/03/2020   ALT 24 07/29/2020   AST 21 07/29/2020   NA 142 07/29/2020   K 4.0 07/29/2020   CL 106 07/29/2020   CREATININE 0.69 07/29/2020   BUN 15 07/29/2020   CO2 25 07/29/2020   TSH  0.75 07/29/2020   INR 0.9 11/29/2008   HGBA1C 5.7 02/12/2008    ASSESSMENT AND PLAN:  Discussed the following assessment and plan:    ICD-10-CM   1. COVID-19 virus infection  U07.1    break through s/p MCABY   2. Sleep disturbance  G47.9   3. Medication management  Z79.899     Counseled.  About sleep hygiene and possible sleep disruption related to illness plus other things. Continue with the melatonin discussed explanation of how it may help sleep cycling. If needed we could add back the gabapentin 100 to 200 mg at  night that was initially used for leg pain.  Disc reassessment at the 90 days mark from infusion about advisability  Timing of booster injections  Glad her leg pain is better  Side effects discussed potential.   Expectant management and discussion of plan and treatment with opportunity to ask questions and all were answered. The patient agreed with the plan and demonstrated an understanding of the instructions.   Advised to call back or seek an in-person evaluation if worsening  or having  further concerns . No follow-ups on file.    Shanon Ace, MD

## 2020-12-04 ENCOUNTER — Other Ambulatory Visit: Payer: Self-pay | Admitting: Family Medicine

## 2021-01-02 NOTE — Progress Notes (Signed)
Office Visit Note  Patient: Kaylee Adams             Date of Birth: 28-Dec-1950           MRN: [id number]             PCP: Burnis Medin, MD Referring: Lyndal Pulley, DO Visit Date: 01/16/2021 Occupation: @GUAROCC @  Subjective:  +ANA.   History of Present Illness: Kaylee Adams is a 70 y.o. female seen in consultation per request of Dr. Tamala Julian for evaluation of positive ANA.  According the patient she has had right lower extremity discomfort since childhood which gradually improved as she got older.  She states about 3 years ago she started having pain in her right knee joint and lower back.  She was referred to [place].  Dr. Tamala Julian did x-ray of her right knee joint which showed mild osteoarthritis.  He gave her a cortisone injection which lasted for about 9 months and then the pain recurred.  She states she was having pain in her right knee joint, right hip joint and lower back.  She was prescribed gabapentin which helped her initially but did not last for very long.  She started using an orthotic in her shoe which eventually helped her knee joint and her hip now she only has lower back pain.  She had some lab work done by Dr. Tamala Julian which showed positive ANA for that reason she was referred to me.  She was also found to have vitamin D deficiency and she was placed on vitamin D.  She states that she has been experiencing some numbness in her left arm.  She denies any neck pain.  There is no history of oral ulcers, nasal ulcers, malar rash, photosensitivity, Raynaud's phenomenon, sicca symptoms, lymphadenopathy or inflammatory arthritis.  There is no family history of autoimmune disease.  She is gravida 5, para 5, miscarriages 0.  Activities of Daily Living:  Patient reports morning stiffness for 30 minutes.   Patient Denies nocturnal pain.  Difficulty dressing/grooming: Denies Difficulty climbing stairs: Denies Difficulty getting out of chair: Denies Difficulty using hands for taps,  buttons, cutlery, and/or writing: Denies  Review of Systems  Constitutional: Positive for fatigue. Negative for night sweats, weight gain and weight loss.  HENT: Negative for mouth sores, trouble swallowing, trouble swallowing, mouth dryness and nose dryness.   Eyes: Negative for pain, redness, itching, visual disturbance and dryness.  Respiratory: Negative for cough, shortness of breath and difficulty breathing.   Cardiovascular: Negative for chest pain, palpitations, hypertension, irregular heartbeat and swelling in legs/feet.  Gastrointestinal: Negative for blood in stool, constipation and diarrhea.  Endocrine: Negative for increased urination.  Genitourinary: Negative for difficulty urinating and vaginal dryness.  Musculoskeletal: Positive for arthralgias, joint pain and morning stiffness. Negative for joint swelling, myalgias, muscle weakness, muscle tenderness and myalgias.  Skin: Negative for color change, rash, hair loss, redness, skin tightness, ulcers and sensitivity to sunlight.  Allergic/Immunologic: Negative for susceptible to infections.  Neurological: Positive for numbness and parasthesias. Negative for dizziness, headaches, memory loss, night sweats and weakness.  Hematological: Negative for bruising/bleeding tendency and swollen glands.  Psychiatric/Behavioral: Negative for depressed mood, confusion and sleep disturbance. The patient is nervous/anxious.     PMFS History:  Patient Active Problem List   Diagnosis Date Noted  . Atrophy of muscle of right lower leg 07/29/2020  . Peroneal cyst, right 06/16/2020  . Degenerative joint disease of knee, right 10/13/2019  . Right hip pain  10/13/2019  . Essential hypertension 04/13/2015  . Hoarseness 04/13/2015  . Low TSH level 07/30/2014  . Neck discomfort 07/30/2014  . Goiter ? right  07/30/2014  . Trouble swallowing hx of  07/30/2014  . Visit for preventive health examination 07/14/2014  . GERD (gastroesophageal reflux  disease) 01/01/2014  . Hypokalemia 12/18/2013  . Shortness of breath 12/18/2013  . Complete tear of right rotator cuff 11/03/2013  . Right shoulder pain 06/30/2013  . Skin cyst 07/16/2011  . Cyst 07/16/2011  . HEPATIC CYST 09/12/2009  . ADVERSE REACTION TO MEDICATION 09/12/2009  . BACK PAIN, THORACIC REGION, LEFT 03/28/2009  . CAD, NATIVE VESSEL 12/26/2008  . CHEST PAIN, ATYPICAL 12/02/2008  . INTERNAL HEMORRHOIDS 01/09/2008  . EXTERNAL HEMORRHOIDS 01/09/2008  . LIPOMA NOS 09/16/2007  . Hyperlipemia 09/16/2007    Past Medical History:  Diagnosis Date  . Allergy    occ takes otc allergy pill  . CAD (coronary artery disease)    a. 11/2005 Cath: nonobs dzs, 30% LAD lesion on cath ;  b. 11/2005 Echo: NL EF;  c. 04/2012 Echo: EF 55-60%, Gr 2 DD;  d. 04/2012 Ex MV: EF 72%, ST depression in recovery with NL perfusion imaging - HTN response to exercise.  . Chest pressure "fatigue" 05/08/2012  . GERD (gastroesophageal reflux disease)   . Hepatic cyst    on ct [place]  . HLD (hyperlipidemia)   . HTN (hypertension)   . Midsternal chest pain    cardiology eval with non-ischemic stress test 04/2012  . Upper airway cough syndrome 04/29/2015   Trial of max gerd rx 04/29/2015 >>>      Family History  Problem Relation Age of Onset  . Breast cancer Sister   . Thyroid disease Sister   . Hypertension Mother   . Colon cancer Brother 58       dx'd about age 65 per pt-   . Prostate cancer Brother   . Lung cancer Brother   . Liver cancer Brother   . ALS Brother   . Healthy Daughter   . Healthy Daughter   . Healthy Son   . Healthy Son   . Healthy Son   . Colon polyps Neg Hx   . Esophageal cancer Neg Hx   . Rectal cancer Neg Hx   . Stomach cancer Neg Hx    Past Surgical History:  Procedure Laterality Date  . ABDOMINAL HYSTERECTOMY     fibroid tumors  . BREAST BIOPSY Right   . BREAST EXCISIONAL BIOPSY Right   . child birth x 5    . CHOLECYSTECTOMY    . COLONOSCOPY  01/09/2008  . SHOULDER  ARTHROSCOPY WITH ROTATOR CUFF REPAIR AND SUBACROMIAL DECOMPRESSION Right 11/03/2013   Procedure: RIGHT SHOULDER ARTHROSCOPY WITH DEBRIDEMENT, ROTATOR CUFF REPAIR AND SUBACROMIAL DECOMPRESSION, DISTAL CLAVICLE RESECTION;  Surgeon: Mcarthur Rossetti, MD;  Location: [facility];  Service: Orthopedics;  Laterality: Right;  Marland Kitchen VESICOVAGINAL FISTULA CLOSURE W/ TAH     for fibroid tumors   Social History   Social History Narrative   Married, full time Network engineer. 3 pets    [place]   6-8 hours sleep    Right Handed   Lives in a two story home.    Drinks 1 cup of Coffee   Immunization History  Administered Date(s) Administered  . Fluad Quad(high Dose 65+) 09/13/2020  . Influenza Split 10/26/2013  . Influenza, High Dose Seasonal PF 11/13/2018, 08/18/2019  . Influenza-Unspecified 10/08/2017, 09/13/2020  . PFIZER(Purple Top)SARS-COV-2 Vaccination 03/14/2020,  04/06/2020  . PPD Test 03/03/2018  . Pneumococcal Conjugate-13 02/01/2017  . Pneumococcal Polysaccharide-23 02/07/2018  . Tdap 02/01/2017     Objective: Vital Signs: BP (!) 142/74 (BP Location: Right Arm, Patient Position: Sitting, Cuff Size: Normal)   Pulse 73   Resp 15   Ht 4' 11.5" (1.511 m)   Wt 163 lb 6.4 oz (74.1 kg)   BMI 32.45 kg/m    Physical Exam Vitals and nursing note reviewed.  Constitutional:      Appearance: She is well-developed and well-nourished.  HENT:     Head: Normocephalic and atraumatic.  Eyes:     Extraocular Movements: EOM normal.     Conjunctiva/sclera: Conjunctivae normal.  Cardiovascular:     Rate and Rhythm: Normal rate and regular rhythm.     Pulses: Intact distal pulses.     Heart sounds: Normal heart sounds.  Pulmonary:     Effort: Pulmonary effort is normal.     Breath sounds: Normal breath sounds.  Abdominal:     General: Bowel sounds are normal.     Palpations: Abdomen is soft.  Musculoskeletal:     Cervical back: Normal range of motion.  Lymphadenopathy:     Cervical: No cervical  adenopathy.  Skin:    General: Skin is warm and dry.     Capillary Refill: Capillary refill takes less than 2 seconds.  Neurological:     Mental Status: She is alert and oriented to person, place, and time.  Psychiatric:        Mood and Affect: Mood and affect normal.        Behavior: Behavior normal.      Musculoskeletal Exam: She has good range of motion of her cervical spine.  She has discomfort in range of motion of her lumbar spine.  She is some discomfort range of motion of her left shoulder joint.  Elbow joints, wrist joints were in good range of motion.  She has DIP prominence bilaterally with no synovitis was noted.  Hip joints with good range of motion.  She had right hamstring and calf muscle tightness.  She has atrophy of her right calf muscle.  She has bilateral pes cavus much more prominent in her right foot.  She has difficulty walking keeping her right heel down.  CDAI Exam: CDAI Score: -- Patient Global: --; Provider Global: -- Swollen: --; Tender: -- Joint Exam 01/16/2021   No joint exam has been documented for this visit   There is currently no information documented on the homunculus. Go to the Rheumatology activity and complete the homunculus joint exam.  Investigation: No additional findings.  Imaging: No results found.  Recent Labs: Lab Results  Component Value Date   WBC 4.8 07/29/2020   HGB 13.4 07/29/2020   PLT 388 07/29/2020   NA 142 07/29/2020   K 4.0 07/29/2020   CL 106 07/29/2020   CO2 25 07/29/2020   GLUCOSE 95 07/29/2020   BUN 15 07/29/2020   CREATININE 0.69 07/29/2020   BILITOT 0.5 07/29/2020   ALKPHOS 56 03/03/2020   AST 21 07/29/2020   ALT 24 07/29/2020   PROT 7.1 07/29/2020   ALBUMIN 4.0 03/03/2020   CALCIUM 10.7 (H) 07/29/2020   GFRAA 63 (L) 10/29/2013   IMPRESSION: 1. 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 2. Degenerative anterolisthesis of L4 and L5. 3. Moderate multifactorial  spinal and bilateral lateral recess stenosis at L4-5. 4. Moderately severe spinal and bilateral lateral  recess stenosis at L5-S1. There is also a shallow broad-based right-sided disc protrusion and right-sided facet spurring contributing to moderate right foraminal stenosis likely effecting the right L5 nerve root.   Electronically Signed   By: Marijo Sanes M.D.   On: 09/03/2020 10:49  Speciality Comments: No specialty comments available.  Procedures:  No procedures performed Allergies: Lisinopril and Penicillins   Assessment / Plan:     Visit Diagnoses: Positive ANA (antinuclear antibody) - 07/29/20: ANA 1:80NS, 1:320 nuclear, nucleolar, RF<14, Ace 52, anti-CCP <16, uric acid 5.1, PTH 35, Ca 10.4, transferrin 247, TSH 0.75, vitamin D 25 -patient has low titer positive ANA.  She denies any history of oral ulcers, nasal ulcers, malar rash, photosensitivity, sicca symptoms, Raynaud's phenomenon, inflammatory arthritis or lymphadenopathy.  There is no family history of autoimmune disease.  To complete the work-up I will obtain additional antibodies today.  Plan: RNP Antibody, Anti-Smith antibody, Sjogrens syndrome-A extractable nuclear antibody, Sjogrens syndrome-B extractable nuclear antibody, Anti-DNA antibody, double-stranded, Anti-scleroderma antibody, C3 and C4  History of rotator cuff tear - Right, complete tear, s/p repair.  She had good response to the right shoulder joint surgery.  She has been having some discomfort in the left shoulder joint.  Hamstring tightness of right lower extremity-she had very tight right hamstring.  I believe that is contributing to her lower back discomfort as well.  Some of the exercises were demonstrated in the office.  I will refer her to physical therapy.  Atrophy of calf muscles on right-she complains of right lower extremity pain since childhood.  She has atrophy of the right calf muscles.  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  She would benefit  from physical therapy.  Primary osteoarthritis of right knee - xray 10/12/19  showed mild OA.  She has had cortisone injection by Dr. Tamala Julian in the past which was helpful.  Pes cavus-she has bilateral pes cavus but much more significant pes cavus in her right foot possibly due to weakness in the muscles.  She has difficulty walking keeping her heel down.  Which may be contributing to her right knee, right hip and lower back pain.  Physical therapy and orthotics will be helpful.  DDD (degenerative disc disease), cervical-I reviewed MRI from October 10, 2019 of her cervical spine from 2020 which showed multilevel spondylosis and foraminal narrowing.  Patient has been followed by Dr. Posey Pronto.  DDD (degenerative disc disease), lumbar-she has multilevel spondylosis, anterolisthesis and moderate to severe spinal stenosis at L5-S1.  She continues to have lower back pain.  She is followed by Dr. Posey Pronto and Dr. Tamala Julian.  Vitamin D deficiency-she was prescribed vitamin D by Dr. Tamala Julian.  Essential hypertension-she is on antihypertensives.  Her blood pressure was mildly elevated today.  History of hyperlipidemia  History of gastroesophageal reflux (GERD)  Hepatic cyst  Orders: Orders Placed This Encounter  Procedures  . RNP Antibody  . Anti-Smith antibody  . Sjogrens syndrome-A extractable nuclear antibody  . Sjogrens syndrome-B extractable nuclear antibody  . Anti-DNA antibody, double-stranded  . Anti-scleroderma antibody  . C3 and C4   No orders of the defined types were placed in this encounter.    Follow-Up Instructions: Return for +ANA.   Bo Merino, MD  Note - This record has been created using Editor, commissioning.  Chart creation errors have been sought, but may not always  have been located. Such creation errors do not reflect on  the standard of medical care.

## 2021-01-08 ENCOUNTER — Other Ambulatory Visit: Payer: Self-pay | Admitting: Family Medicine

## 2021-01-16 ENCOUNTER — Encounter: Payer: Self-pay | Admitting: Rheumatology

## 2021-01-16 ENCOUNTER — Other Ambulatory Visit: Payer: Self-pay

## 2021-01-16 ENCOUNTER — Ambulatory Visit (INDEPENDENT_AMBULATORY_CARE_PROVIDER_SITE_OTHER): Payer: Medicare Other | Admitting: Rheumatology

## 2021-01-16 ENCOUNTER — Telehealth: Payer: Self-pay | Admitting: Rheumatology

## 2021-01-16 VITALS — BP 142/74 | HR 73 | Resp 15 | Ht 59.5 in | Wt 163.4 lb

## 2021-01-16 DIAGNOSIS — M1711 Unilateral primary osteoarthritis, right knee: Secondary | ICD-10-CM | POA: Diagnosis not present

## 2021-01-16 DIAGNOSIS — M5030 Other cervical disc degeneration, unspecified cervical region: Secondary | ICD-10-CM

## 2021-01-16 DIAGNOSIS — R768 Other specified abnormal immunological findings in serum: Secondary | ICD-10-CM

## 2021-01-16 DIAGNOSIS — Z8719 Personal history of other diseases of the digestive system: Secondary | ICD-10-CM | POA: Diagnosis not present

## 2021-01-16 DIAGNOSIS — E559 Vitamin D deficiency, unspecified: Secondary | ICD-10-CM

## 2021-01-16 DIAGNOSIS — I10 Essential (primary) hypertension: Secondary | ICD-10-CM

## 2021-01-16 DIAGNOSIS — K7689 Other specified diseases of liver: Secondary | ICD-10-CM

## 2021-01-16 DIAGNOSIS — Q667 Congenital pes cavus, unspecified foot: Secondary | ICD-10-CM | POA: Diagnosis not present

## 2021-01-16 DIAGNOSIS — M5136 Other intervertebral disc degeneration, lumbar region: Secondary | ICD-10-CM

## 2021-01-16 DIAGNOSIS — M62561 Muscle wasting and atrophy, not elsewhere classified, right lower leg: Secondary | ICD-10-CM | POA: Diagnosis not present

## 2021-01-16 DIAGNOSIS — Z8639 Personal history of other endocrine, nutritional and metabolic disease: Secondary | ICD-10-CM | POA: Diagnosis not present

## 2021-01-16 DIAGNOSIS — M6289 Other specified disorders of muscle: Secondary | ICD-10-CM | POA: Diagnosis not present

## 2021-01-16 DIAGNOSIS — Z8739 Personal history of other diseases of the musculoskeletal system and connective tissue: Secondary | ICD-10-CM | POA: Diagnosis not present

## 2021-01-16 NOTE — Telephone Encounter (Signed)
PT ordered at [facility]

## 2021-01-16 NOTE — Telephone Encounter (Signed)
FYI: Patient states Dr. Estanislado Pandy wants her to go to PT. Patient prefers to go to [place], or [place] for PT if possible.

## 2021-01-17 ENCOUNTER — Encounter: Payer: Self-pay | Admitting: Rheumatology

## 2021-01-17 ENCOUNTER — Other Ambulatory Visit: Payer: Self-pay

## 2021-01-17 ENCOUNTER — Ambulatory Visit (INDEPENDENT_AMBULATORY_CARE_PROVIDER_SITE_OTHER): Payer: Medicare Other | Admitting: Internal Medicine

## 2021-01-17 ENCOUNTER — Encounter: Payer: Self-pay | Admitting: Internal Medicine

## 2021-01-17 VITALS — BP 140/70 | HR 87 | Temp 98.2°F | Ht 59.0 in | Wt 161.2 lb

## 2021-01-17 DIAGNOSIS — M545 Low back pain, unspecified: Secondary | ICD-10-CM

## 2021-01-17 DIAGNOSIS — Z79899 Other long term (current) drug therapy: Secondary | ICD-10-CM

## 2021-01-17 DIAGNOSIS — K921 Melena: Secondary | ICD-10-CM

## 2021-01-17 DIAGNOSIS — G8929 Other chronic pain: Secondary | ICD-10-CM | POA: Diagnosis not present

## 2021-01-17 LAB — RNP ANTIBODY: Ribonucleic Protein(ENA) Antibody, IgG: <1 AI

## 2021-01-17 LAB — C3 AND C4
C3 Complement: 151 mg/dL (ref 83–193)
C4 Complement: 43 mg/dL (ref 15–57)

## 2021-01-17 LAB — SJOGRENS SYNDROME-A EXTRACTABLE NUCLEAR ANTIBODY: SSA (Ro) (ENA) Antibody, IgG: <1 AI

## 2021-01-17 LAB — ANTI-DNA ANTIBODY, DOUBLE-STRANDED: ds DNA Ab: <1 IU/mL

## 2021-01-17 LAB — ANTI-SMITH ANTIBODY: ENA SM Ab Ser-aCnc: <1 AI

## 2021-01-17 LAB — ANTI-SCLERODERMA ANTIBODY: Scleroderma (Scl-70) (ENA) Antibody, IgG: <1 AI

## 2021-01-17 LAB — SJOGRENS SYNDROME-B EXTRACTABLE NUCLEAR ANTIBODY: SSB (La) (ENA) Antibody, IgG: <1 AI

## 2021-01-17 NOTE — Progress Notes (Signed)
All the labs are within normal limits.  I will discuss results at the follow-up visit.

## 2021-01-17 NOTE — Progress Notes (Signed)
Chief Complaint  Patient presents with  . Acute Visit    Lower back pain X 2 weeks/ blood in stool    HPI: Kaylee Adams 70 y.o. come in for back pain  SDA      Low back pain continuous without relief   Had been using ibuprofen that seemed to help .   She limited because of effect on Bp controlled .   Thought saw blood in bm yesterday   Red x 2  twice . Not sure it was blood but seemed like this. Was  Red . Had beet juice 2 days before . She is utd on colon  2020  Had some mild lower abdominal discomfort but no vomiting major change in bowel habits.   At times bad  Takes  ibuprfen for 2 weeks. Very sore and all day and  Movements  turnign over. All day worse in am   Stiffness   Eases as day goes on. No going down leg.  Pain about 5 /10   She has been under care with Dr. Tamala Julian and then Dr. Posey Pronto for back pain and leg pain however today she is not having radiating leg pain.  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  She has been doing exercises and trying to stretch bending.  Saw rheumatology Dr. August Luz  yesterday lab work was done evaluating a positive ANA.     ROS: See pertinent positives and negatives per HPI.  Past Medical History:  Diagnosis Date  . Allergy    occ takes otc allergy pill  . CAD (coronary artery disease)    a. 11/2005 Cath: nonobs dzs, 30% LAD lesion on cath ;  b. 11/2005 Echo: NL EF;  c. 04/2012 Echo: EF 55-60%, Gr 2 DD;  d. 04/2012 Ex MV: EF 72%, ST depression in recovery with NL perfusion imaging - HTN response to exercise.  . Chest pressure "fatigue" 05/08/2012  . GERD (gastroesophageal reflux disease)   . Hepatic cyst    on ct [place]  . HLD (hyperlipidemia)   . HTN (hypertension)   . Midsternal chest pain    cardiology eval with non-ischemic stress test 04/2012  . Upper airway cough syndrome 04/29/2015   Trial of max gerd rx 04/29/2015 >>>      Family History  Problem Relation Age of Onset  . Breast cancer  Sister   . Thyroid disease Sister   . Hypertension Mother   . Colon cancer Brother 86       dx'd about age 101 per pt-   . Prostate cancer Brother   . Lung cancer Brother   . Liver cancer Brother   . ALS Brother   . Healthy Daughter   . Healthy Daughter   . Healthy Son   . Healthy Son   . Healthy Son   . Colon polyps Neg Hx   . Esophageal cancer Neg Hx   . Rectal cancer Neg Hx   . Stomach cancer Neg Hx     Social History   Socioeconomic History  . Marital status: Married    Spouse name: Not on file  . Number of children: 5  . Years of education: Not on file  . Highest education level: Not on file  Occupational History    Employer: [employer]  Tobacco Use  . Smoking status: Never Smoker  . Smokeless tobacco: Never Used  Vaping Use  . Vaping Use: Never used  Substance  and Sexual Activity  . Alcohol use: No  . Drug use: No  . Sexual activity: Not on file  Other Topics Concern  . Not on file  Social History Narrative   Married, full time Network engineer. 3 pets    [place]   6-8 hours sleep    Right Handed   Lives in a two story home.    Drinks 1 cup of Coffee   Social Determinants of Health   Financial Resource Strain: Low Risk   . Difficulty of Paying Living Expenses: Not hard at all  Food Insecurity: No Food Insecurity  . Worried About Charity fundraiser in the Last Year: Never true  . Ran Out of Food in the Last Year: Never true  Transportation Needs: No Transportation Needs  . Lack of Transportation (Medical): No  . Lack of Transportation (Non-Medical): No  Physical Activity: Insufficiently Active  . Days of Exercise per Week: 3 days  . Minutes of Exercise per Session: 30 min  Stress: Stress Concern Present  . Feeling of Stress : To some extent  Social Connections: Moderately Integrated  . Frequency of Communication with Friends and Family: More than three times a week  . Frequency of Social Gatherings with Friends and Family: Three times a week   . Attends Religious Services: Never  . Active Member of Clubs or Organizations: Yes  . Attends Archivist Meetings: More than 4 times per year  . Marital Status: Married    Outpatient Medications Prior to Visit  Medication Sig Dispense Refill  . amLODipine (NORVASC) 10 MG tablet TAKE 1 TABLET BY MOUTH EVERY DAY 90 tablet 3  . aspirin 81 MG chewable tablet Chew by mouth daily.    . Cyanocobalamin (VITAMIN B-12 PO) Take 5,000 mcg by mouth. Liquid    . gabapentin (NEURONTIN) 100 MG capsule Take 2 capsules (200 mg total) by mouth at bedtime. 180 capsule 3  . hydrochlorothiazide (HYDRODIURIL) 25 MG tablet TAKE 1 TABLET BY MOUTH EVERY DAY 90 tablet 3  . losartan (COZAAR) 100 MG tablet TAKE 1 TABLET BY MOUTH EVERY DAY 90 tablet 3  . MAGNESIUM PO Take by mouth daily.    . MULTIPLE VITAMIN PO Take 1 capsule by mouth daily.    Marland Kitchen OVER THE COUNTER MEDICATION Equate Allergy med prn     . vitamin C (ASCORBIC ACID) 500 MG tablet Take 500 mg by mouth daily.    . Vitamin D, Ergocalciferol, (DRISDOL) 1.25 MG (50000 UNIT) CAPS capsule TAKE 1 CAPSULE (50,000 UNITS TOTAL) BY MOUTH EVERY 7 (SEVEN) DAYS. 12 capsule 0   No facility-administered medications prior to visit.     EXAM:  BP 140/70   Pulse 87   Temp 98.2 F (36.8 C) (Oral)   Ht 4\' 11"  (1.499 m)   Wt 161 lb 3.2 oz (73.1 kg)   SpO2 97%   BMI 32.56 kg/m   Body mass index is 32.56 kg/m.  GENERAL: vitals reviewed and listed above, alert, oriented, appears well hydrated and in no acute distress HEENT: atraumatic, conjunctiva  clear, no obvious abnormalities on inspection of external nose and ears OP : Masked NECK: no obvious masses on inspection palpation  LUNGS: clear to auscultation bilaterally, no wheezes, rales or rhonchi, good air movement CV: HRRR, no clubbing cyanosis or  peripheral edema nl cap refill  Abdomen bowel sounds present no guarding rebound MS: moves all extremities right leg smaller than left normal gait No  petechia or excess bruising. PSYCH:  pleasant and cooperative, no obvious depression or anxiety Lab Results  Component Value Date   WBC 4.8 07/29/2020   HGB 13.4 07/29/2020   HCT 41.7 07/29/2020   PLT 388 07/29/2020   GLUCOSE 95 07/29/2020   CHOL 239 (H) 03/03/2020   TRIG 129.0 03/03/2020   HDL 53.20 03/03/2020   LDLDIRECT 153.2 06/23/2012   LDLCALC 160 (H) 03/03/2020   ALT 24 07/29/2020   AST 21 07/29/2020   NA 142 07/29/2020   K 4.0 07/29/2020   CL 106 07/29/2020   CREATININE 0.69 07/29/2020   BUN 15 07/29/2020   CO2 25 07/29/2020   TSH 0.75 07/29/2020   INR 0.9 11/29/2008   HGBA1C 5.7 02/12/2008   BP Readings from Last 3 Encounters:  01/17/21 140/70  01/16/21 (!) 142/74  11/25/20 103/62   Record review.  Last CBC was in August 2021 ASSESSMENT AND PLAN:  Discussed the following assessment and plan:  Symptom of blood in stool - Plan: CBC with Differential/Platelet, CBC with Differential/Platelet  Chronic midline low back pain without sciatica  Medication management Uncertain if the redness in the stool was blood although suspicious for such. Agree with trying to be off the ibuprofen and use Tylenol instead although may not be as helpful. We will reach out to Dr. Tamala Julian consider a prednisone course for her back flare. Hemoccult since the cards were giving to patient #4 with instructions that she can bring back to check for microscopic blood but also to help identify red substance if it returns but make sure to mark the cards correctly. If recurrent or blood in stool will refer back to GI for advice. CBC and differential to be done today as baseline. Have her adapter exercise and avoid deep bending of the back reviewed MRI scan results with her.  This was done in the fall.  -Patient advised to return or notify health care team  if  new concerns arise.  In interim.  Patient Instructions  Do stool cards to check for blood that is not visible.  Also if you see red   Noted this and we can do the tests for blood on that sample.  If  Recurring or positive for bleed we will have you see the Gi team.   Will message dr Tamala Julian  Consider  A course of prednisone   For now walking ok  Gentle with stretching and bending  Though.   Standley Brooking. Dyllen Menning M.D.

## 2021-01-17 NOTE — Patient Instructions (Signed)
Do stool cards to check for blood that is not visible.  Also if you see red  Noted this and we can do the tests for blood on that sample.  If  Recurring or positive for bleed we will have you see the Gi team.   Will message dr Tamala Julian  Consider  A course of prednisone   For now walking ok  Gentle with stretching and bending  Though.   Kaylee Adams

## 2021-01-18 LAB — CBC WITH DIFFERENTIAL/PLATELET
Basophils Absolute: 0.1 10*3/uL (ref 0.0–0.1)
Basophils Relative: 0.9 % (ref 0.0–3.0)
Eosinophils Absolute: 0 10*3/uL (ref 0.0–0.7)
Eosinophils Relative: 0.2 % (ref 0.0–5.0)
HCT: 40.9 % (ref 36.0–46.0)
Hemoglobin: 13.4 g/dL (ref 12.0–15.0)
Lymphocytes Relative: 36.3 % (ref 12.0–46.0)
Lymphs Abs: 2.3 10*3/uL (ref 0.7–4.0)
MCHC: 32.8 g/dL (ref 30.0–36.0)
MCV: 79.3 fl (ref 78.0–100.0)
Monocytes Absolute: 0.5 10*3/uL (ref 0.1–1.0)
Monocytes Relative: 7.8 % (ref 3.0–12.0)
Neutro Abs: 3.5 10*3/uL (ref 1.4–7.7)
Neutrophils Relative %: 54.8 % (ref 43.0–77.0)
Platelets: 367 10*3/uL (ref 150.0–400.0)
RBC: 5.15 Mil/uL — ABNORMAL HIGH (ref 3.87–5.11)
RDW: 14.3 % (ref 11.5–15.5)
WBC: 6.4 10*3/uL (ref 4.0–10.5)

## 2021-01-20 MED ORDER — PREDNISONE 20 MG PO TABS: ORAL_TABLET | ORAL | 0 refills | Status: DC

## 2021-01-20 NOTE — Addendum Note (Signed)
Addended byShanon Ace K on: 01/20/2021 11:33 AM   Modules accepted: Orders

## 2021-01-20 NOTE — Progress Notes (Signed)
Blood count is ok no anemia . Reached out to dr Tamala Julian  he agreeed could try steroid and then an exercise program .  I Will send in a course of steroid  [facility]  Please make an appt with dr Tamala Julian again  for follow up  to help with exercise profram help.

## 2021-01-21 ENCOUNTER — Other Ambulatory Visit: Payer: Self-pay | Admitting: Family Medicine

## 2021-01-23 NOTE — Progress Notes (Signed)
Mychart message sent: Blood count is ok no anemia .  Reached out to dr Tamala Julian he agreeed could try steroid and then an exercise program .  I Will send in a course of steroid toyour pharmacy  Please make an appt with dr Tamala Julian again for follow up to help with exercise profram help.

## 2021-01-29 DIAGNOSIS — M5030 Other cervical disc degeneration, unspecified cervical region: Secondary | ICD-10-CM | POA: Insufficient documentation

## 2021-01-29 DIAGNOSIS — M51369 Other intervertebral disc degeneration, lumbar region without mention of lumbar back pain or lower extremity pain: Secondary | ICD-10-CM | POA: Insufficient documentation

## 2021-01-29 DIAGNOSIS — E559 Vitamin D deficiency, unspecified: Secondary | ICD-10-CM | POA: Insufficient documentation

## 2021-01-29 DIAGNOSIS — Q667 Congenital pes cavus, unspecified foot: Secondary | ICD-10-CM | POA: Insufficient documentation

## 2021-01-29 DIAGNOSIS — M5136 Other intervertebral disc degeneration, lumbar region: Secondary | ICD-10-CM | POA: Insufficient documentation

## 2021-01-29 NOTE — Progress Notes (Signed)
Office Visit Note  Patient: Kaylee Adams             Date of Birth: [birth year]           MRN: [id number]             PCP: Burnis Medin, MD Referring: Burnis Medin, MD Visit Date: 02/09/2021 Occupation: @GUAROCC @  Subjective:  Positive ANA.   History of Present Illness: Kaylee Adams is a 70 y.o. female was seen initially for the evaluation of positive ANA.  She had no clinical features of autoimmune disease.  She denies any history of oral ulcers, nasal ulcers, malar rash, photosensitivity, sicca symptoms, Raynaud's phenomenon or inflammatory arthritis.  She continues to have some discomfort in her left shoulder.  She has hamstring tightness and knee joint discomfort.  She continues to have neck and lower back pain.  She states that she has appointment coming up with Dr. Tamala Julian.  She decided not to go for physical therapy.  Activities of Daily Living:  Patient reports morning stiffness for  0 minutes.   Patient Denies nocturnal pain.  Difficulty dressing/grooming: Denies Difficulty climbing stairs: Denies Difficulty getting out of chair: Denies Difficulty using hands for taps, buttons, cutlery, and/or writing: Denies  Review of Systems  Constitutional: Positive for fatigue. Negative for night sweats, weight gain and weight loss.  HENT: Negative for mouth sores, trouble swallowing, trouble swallowing, mouth dryness and nose dryness.   Eyes: Negative for pain, redness, itching, visual disturbance and dryness.  Respiratory: Positive for shortness of breath. Negative for cough, wheezing and difficulty breathing.   Cardiovascular: Negative for chest pain, palpitations, hypertension, irregular heartbeat and swelling in legs/feet.  Gastrointestinal: Negative for blood in stool, constipation and diarrhea.  Endocrine: Negative for increased urination.  Genitourinary: Negative for difficulty urinating, painful urination and vaginal dryness.  Musculoskeletal: Positive for arthralgias,  joint pain, myalgias and myalgias. Negative for joint swelling, muscle weakness, morning stiffness and muscle tenderness.  Skin: Negative for color change, rash, hair loss, redness, skin tightness, ulcers and sensitivity to sunlight.  Allergic/Immunologic: Negative for susceptible to infections.  Neurological: Positive for numbness. Negative for dizziness, headaches, memory loss, night sweats and weakness.  Hematological: Negative for bruising/bleeding tendency and swollen glands.  Psychiatric/Behavioral: Negative for depressed mood, confusion and sleep disturbance. The patient is not nervous/anxious.     PMFS History:  Patient Active Problem List   Diagnosis Date Noted  . Pes cavus 01/29/2021  . DDD (degenerative disc disease), cervical 01/29/2021  . DDD (degenerative disc disease), lumbar 01/29/2021  . Vitamin D deficiency 01/29/2021  . Atrophy of muscle of right lower leg 07/29/2020  . Peroneal cyst, right 06/16/2020  . Degenerative joint disease of knee, right 10/13/2019  . Right hip pain 10/13/2019  . Essential hypertension 04/13/2015  . Hoarseness 04/13/2015  . Low TSH level 07/30/2014  . Neck discomfort 07/30/2014  . Goiter ? right  07/30/2014  . Trouble swallowing hx of  07/30/2014  . Visit for preventive health examination 07/14/2014  . GERD (gastroesophageal reflux disease) 01/01/2014  . Hypokalemia 12/18/2013  . Shortness of breath 12/18/2013  . Complete tear of right rotator cuff 11/03/2013  . Right shoulder pain 06/30/2013  . Skin cyst 07/16/2011  . Cyst 07/16/2011  . HEPATIC CYST 09/12/2009  . ADVERSE REACTION TO MEDICATION 09/12/2009  . BACK PAIN, THORACIC REGION, LEFT 03/28/2009  . CAD, NATIVE VESSEL 12/26/2008  . CHEST PAIN, ATYPICAL 12/02/2008  . INTERNAL HEMORRHOIDS 01/09/2008  . EXTERNAL  HEMORRHOIDS 01/09/2008  . LIPOMA NOS 09/16/2007  . Hyperlipemia 09/16/2007    Past Medical History:  Diagnosis Date  . Allergy    occ takes otc allergy pill  . CAD  (coronary artery disease)    a. 11/2005 Cath: nonobs dzs, 30% LAD lesion on cath ;  b. 11/2005 Echo: NL EF;  c. 04/2012 Echo: EF 55-60%, Gr 2 DD;  d. 04/2012 Ex MV: EF 72%, ST depression in recovery with NL perfusion imaging - HTN response to exercise.  . Chest pressure "fatigue" 05/08/2012  . GERD (gastroesophageal reflux disease)   . Hepatic cyst    on ct [place]  . HLD (hyperlipidemia)   . HTN (hypertension)   . Midsternal chest pain    cardiology eval with non-ischemic stress test 04/2012  . Upper airway cough syndrome 04/29/2015   Trial of max gerd rx 04/29/2015 >>>      Family History  Problem Relation Age of Onset  . Breast cancer Sister   . Thyroid disease Sister   . Hypertension Mother   . Colon cancer Brother 43       dx'd about age 40 per pt-   . Prostate cancer Brother   . Lung cancer Brother   . Liver cancer Brother   . ALS Brother   . Healthy Daughter   . Healthy Daughter   . Healthy Son   . Healthy Son   . Healthy Son   . Colon polyps Neg Hx   . Esophageal cancer Neg Hx   . Rectal cancer Neg Hx   . Stomach cancer Neg Hx    Past Surgical History:  Procedure Laterality Date  . ABDOMINAL HYSTERECTOMY     fibroid tumors  . BREAST BIOPSY Right   . BREAST EXCISIONAL BIOPSY Right   . child birth x 5    . CHOLECYSTECTOMY    . COLONOSCOPY  01/09/2008  . SHOULDER ARTHROSCOPY WITH ROTATOR CUFF REPAIR AND SUBACROMIAL DECOMPRESSION Right 11/03/2013   Procedure: RIGHT SHOULDER ARTHROSCOPY WITH DEBRIDEMENT, ROTATOR CUFF REPAIR AND SUBACROMIAL DECOMPRESSION, DISTAL CLAVICLE RESECTION;  Surgeon: Mcarthur Rossetti, MD;  Location: [facility];  Service: Orthopedics;  Laterality: Right;  Marland Kitchen VESICOVAGINAL FISTULA CLOSURE W/ TAH     for fibroid tumors   Social History   Social History Narrative   Married, full time Network engineer. 3 pets    [place]   6-8 hours sleep    Right Handed   Lives in a two story home.    Drinks 1 cup of Coffee   Immunization History  Administered Date(s)  Administered  . Fluad Quad(high Dose 65+) 09/13/2020  . Influenza Split 10/26/2013  . Influenza, High Dose Seasonal PF 11/13/2018, 08/18/2019  . Influenza-Unspecified 10/08/2017, 09/13/2020  . PFIZER(Purple Top)SARS-COV-2 Vaccination 03/14/2020, 04/06/2020  . PPD Test 03/03/2018  . Pneumococcal Conjugate-13 02/01/2017  . Pneumococcal Polysaccharide-23 02/07/2018  . Tdap 02/01/2017     Objective: Vital Signs: BP 117/62 (BP Location: Left Arm, Patient Position: Sitting, Cuff Size: Normal)   Pulse 83   Resp 14   Ht 5\' 1"  (1.549 m)   Wt 165 lb 9.6 oz (75.1 kg)   BMI 31.29 kg/m    Physical Exam Vitals and nursing note reviewed.  Constitutional:      Appearance: She is well-developed and well-nourished.  HENT:     Head: Normocephalic and atraumatic.  Eyes:     Extraocular Movements: EOM normal.     Conjunctiva/sclera: Conjunctivae normal.  Cardiovascular:     Rate  and Rhythm: Normal rate and regular rhythm.     Pulses: Intact distal pulses.     Heart sounds: Normal heart sounds.  Pulmonary:     Effort: Pulmonary effort is normal.     Breath sounds: Normal breath sounds.  Abdominal:     General: Bowel sounds are normal.     Palpations: Abdomen is soft.  Musculoskeletal:     Cervical back: Normal range of motion.  Lymphadenopathy:     Cervical: No cervical adenopathy.  Skin:    General: Skin is warm and dry.     Capillary Refill: Capillary refill takes less than 2 seconds.  Neurological:     Mental Status: She is alert and oriented to person, place, and time.  Psychiatric:        Mood and Affect: Mood and affect normal.        Behavior: Behavior normal.      Musculoskeletal Exam: C-spine was in good range of motion.  Lumbar spine was in good range of motion with some discomfort.  She had discomfort range of motion of left shoulder joint.  Elbow joints, wrist joints, MCPs PIPs and DIPs with good range of motion with no synovitis.  Hip joints, knee joints, ankles, MTPs  and PIPs with good range of motion.  She had right hamstring tightness and bilateral pes cavus.  CDAI Exam: CDAI Score: - Patient Global: -; Provider Global: - Swollen: -; Tender: - Joint Exam 02/09/2021   No joint exam has been documented for this visit   There is currently no information documented on the homunculus. Go to the Rheumatology activity and complete the homunculus joint exam.  Investigation: No additional findings.  Imaging: No results found.  Recent Labs: Lab Results  Component Value Date   WBC 6.4 01/17/2021   HGB 13.4 01/17/2021   PLT 367.0 01/17/2021   NA 142 07/29/2020   K 4.0 07/29/2020   CL 106 07/29/2020   CO2 25 07/29/2020   GLUCOSE 95 07/29/2020   BUN 15 07/29/2020   CREATININE 0.69 07/29/2020   BILITOT 0.5 07/29/2020   ALKPHOS 56 03/03/2020   AST 21 07/29/2020   ALT 24 07/29/2020   PROT 7.1 07/29/2020   ALBUMIN 4.0 03/03/2020   CALCIUM 10.7 (H) 07/29/2020   GFRAA 63 (L) 10/29/2013   January 16, 2021 ENA negative, C3-C4 normal  07/29/20: ANA 1:80NS, 1:320 nuclear, nucleolar, RF<14, Ace 52, anti-CCP <16, uric acid 5.1, PTH 35, Ca 10.4, transferrin 247, TSH 0.75, vitamin D 25  Speciality Comments: No specialty comments available.  Procedures:  No procedures performed Allergies: Lisinopril and Penicillins   Assessment / Plan:     Visit Diagnoses: Positive ANA (antinuclear antibody) - Positive ANA, low titer, ENA negative, complements normal.  Patient has no clinical features of autoimmune disease.  She denies any history of oral ulcers, nasal ulcers, malar rash, photosensitivity, lymphadenopathy or Raynaud's phenomenon.  She has no evidence of inflammatory arthritis.  I advised her to contact me in case she develops any new symptoms.  History of rotator cuff tear - History of right complete rotator cuff tear, status post repair.  She has been experiencing some discomfort in the left shoulder.  Hamstring tightness of right lower extremity -  Patient was referred to physical therapy and exercises were demonstrated in the office.  Hamstring tightness can be causing lower back pain.  Patient decided not to go for physical therapy.  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  Primary osteoarthritis  of right knee - Mild osteoarthritis.  She had a good response to the cortisone injection given by Dr. Tamala Julian.  Pes cavus - Bilateral pes cavus more prominent on the right side.  She may benefit from physical therapy and orthotics.  DDD (degenerative disc disease), cervical - Multilevel spondylosis and foraminal narrowing.  Patient is followed by Dr. Posey Pronto.  DDD (degenerative disc disease), lumbar - Multilevel spondylosis, anterolisthesis and moderate to severe spinal stenosis at L5-S1.  She continues to have lower back pain.  She is followed by Dr. Tamala Julian and Dr. Posey Pronto.  Vitamin D deficiency - She is on vitamin D supplement.  Essential hypertension-her blood pressure is normal today.  History of hyperlipidemia  Hepatic cyst  History of gastroesophageal reflux (GERD)  Orders: No orders of the defined types were placed in this encounter.  No orders of the defined types were placed in this encounter.     Follow-Up Instructions: Return if symptoms worsen or fail to improve, for +ANA, OA.   Bo Merino, MD  Note - This record has been created using Editor, commissioning.  Chart creation errors have been sought, but may not always  have been located. Such creation errors do not reflect on  the standard of medical care.

## 2021-02-09 ENCOUNTER — Other Ambulatory Visit: Payer: Self-pay

## 2021-02-09 ENCOUNTER — Ambulatory Visit (INDEPENDENT_AMBULATORY_CARE_PROVIDER_SITE_OTHER): Payer: Medicare Other | Admitting: Rheumatology

## 2021-02-09 ENCOUNTER — Encounter: Payer: Self-pay | Admitting: Rheumatology

## 2021-02-09 VITALS — BP 117/62 | HR 83 | Resp 14 | Ht 61.0 in | Wt 165.6 lb

## 2021-02-09 DIAGNOSIS — M62561 Muscle wasting and atrophy, not elsewhere classified, right lower leg: Secondary | ICD-10-CM

## 2021-02-09 DIAGNOSIS — Z8739 Personal history of other diseases of the musculoskeletal system and connective tissue: Secondary | ICD-10-CM | POA: Diagnosis not present

## 2021-02-09 DIAGNOSIS — K7689 Other specified diseases of liver: Secondary | ICD-10-CM

## 2021-02-09 DIAGNOSIS — M5136 Other intervertebral disc degeneration, lumbar region: Secondary | ICD-10-CM

## 2021-02-09 DIAGNOSIS — I10 Essential (primary) hypertension: Secondary | ICD-10-CM

## 2021-02-09 DIAGNOSIS — Z8639 Personal history of other endocrine, nutritional and metabolic disease: Secondary | ICD-10-CM | POA: Diagnosis not present

## 2021-02-09 DIAGNOSIS — E559 Vitamin D deficiency, unspecified: Secondary | ICD-10-CM | POA: Diagnosis not present

## 2021-02-09 DIAGNOSIS — M1711 Unilateral primary osteoarthritis, right knee: Secondary | ICD-10-CM

## 2021-02-09 DIAGNOSIS — Q667 Congenital pes cavus, unspecified foot: Secondary | ICD-10-CM | POA: Diagnosis not present

## 2021-02-09 DIAGNOSIS — R768 Other specified abnormal immunological findings in serum: Secondary | ICD-10-CM

## 2021-02-09 DIAGNOSIS — Z8719 Personal history of other diseases of the digestive system: Secondary | ICD-10-CM

## 2021-02-09 DIAGNOSIS — R7689 Other specified abnormal immunological findings in serum: Secondary | ICD-10-CM

## 2021-02-09 DIAGNOSIS — M5030 Other cervical disc degeneration, unspecified cervical region: Secondary | ICD-10-CM | POA: Diagnosis not present

## 2021-02-09 DIAGNOSIS — M6289 Other specified disorders of muscle: Secondary | ICD-10-CM | POA: Diagnosis not present

## 2021-02-09 DIAGNOSIS — M51369 Other intervertebral disc degeneration, lumbar region without mention of lumbar back pain or lower extremity pain: Secondary | ICD-10-CM

## 2021-02-23 DIAGNOSIS — N3281 Overactive bladder: Secondary | ICD-10-CM | POA: Diagnosis not present

## 2021-02-23 DIAGNOSIS — Z01419 Encounter for gynecological examination (general) (routine) without abnormal findings: Secondary | ICD-10-CM | POA: Diagnosis not present

## 2021-02-23 DIAGNOSIS — Z6830 Body mass index (BMI) 30.0-30.9, adult: Secondary | ICD-10-CM | POA: Diagnosis not present

## 2021-02-23 DIAGNOSIS — Z124 Encounter for screening for malignant neoplasm of cervix: Secondary | ICD-10-CM | POA: Diagnosis not present

## 2021-02-26 NOTE — Progress Notes (Signed)
Chief Complaint  Patient presents with   Annual Exam    HPI: Kaylee Adams 70 y.o. come in for Chronic disease management  Yearly visit   Has seen dr D  Positive ana   Dx  OA  On a prn fu plan   Vit d supplementation has helped her leg pains joint pains.  Still has problems with back is active but not specific. Blood pressures been okay no cardiovascular symptoms reported. No injuries recent Allergies stable BP no change meds  Hh of 2  No pets . Activity:  Not inactive.  linted back  No tobacco ,  No etoh ,  Sugar beverages  Coffee   Cranberry juice .  Sleep :   Good   Reported  Pharmacy stated Shingrix vaccine will cost her $200 so delayed ROS: See pertinent positives and negatives per HPI.  Past Medical History:  Diagnosis Date   Allergy    occ takes otc allergy pill   CAD (coronary artery disease)    a. 11/2005 Cath: nonobs dzs, 30% LAD lesion on cath ;  b. 11/2005 Echo: NL EF;  c. 04/2012 Echo: EF 55-60%, Gr 2 DD;  d. 04/2012 Ex MV: EF 72%, ST depression in recovery with NL perfusion imaging - HTN response to exercise.   Chest pressure "fatigue" 05/08/2012   GERD (gastroesophageal reflux disease)    Hepatic cyst    on ct [place]   HLD (hyperlipidemia)    HTN (hypertension)    Midsternal chest pain    cardiology eval with non-ischemic stress test 04/2012   Upper airway cough syndrome 04/29/2015   Trial of max gerd rx 04/29/2015 >>>      Family History  Problem Relation Age of Onset   Breast cancer Sister    Thyroid disease Sister    Hypertension Mother    Colon cancer Brother 9       dx'd about age 53 per pt-    Prostate cancer Brother    Lung cancer Brother    Liver cancer Brother    ALS Brother    Healthy Daughter    Healthy Daughter    Healthy Son    Healthy Son    Healthy Son    Colon polyps Neg Hx    Esophageal cancer Neg Hx    Rectal cancer Neg Hx    Stomach cancer Neg Hx     Social History   Socioeconomic History   Marital  status: Married    Spouse name: Not on file   Number of children: 5   Years of education: Not on file   Highest education level: Not on file  Occupational History    Employer: [employer]  Tobacco Use   Smoking status: Never Smoker   Smokeless tobacco: Never Used  Scientific laboratory technician Use: Never used  Substance and Sexual Activity   Alcohol use: No   Drug use: No   Sexual activity: Not on file  Other Topics Concern   Not on file  Social History Narrative   Married, full time Network engineer. 3 pets    [place]   6-8 hours sleep    Right Handed   Lives in a two story home.    Drinks 1 cup of Coffee   Social Determinants of Health   Financial Resource Strain: Low Risk    Difficulty of Paying Living Expenses: Not hard at all  Food Insecurity: No Food Insecurity   Worried About Running  Out of Food in the Last Year: Never true   Ran Out of Food in the Last Year: Never true  Transportation Needs: No Transportation Needs   Lack of Transportation (Medical): No   Lack of Transportation (Non-Medical): No  Physical Activity: Insufficiently Active   Days of Exercise per Week: 3 days   Minutes of Exercise per Session: 30 min  Stress: Stress Concern Present   Feeling of Stress : To some extent  Social Connections: Moderately Integrated   Frequency of Communication with Friends and Family: More than three times a week   Frequency of Social Gatherings with Friends and Family: Three times a week   Attends Religious Services: Never   Active Member of Clubs or Organizations: Yes   Attends Music therapist: More than 4 times per year   Marital Status: Married    Outpatient Medications Prior to Visit  Medication Sig Dispense Refill   amLODipine (NORVASC) 10 MG tablet TAKE 1 TABLET BY MOUTH EVERY DAY 90 tablet 3   aspirin 81 MG chewable tablet Chew by mouth daily.     Cyanocobalamin (VITAMIN B-12 PO) Take 5,000 mcg by mouth. Liquid      estradiol (ESTRACE) 0.1 MG/GM vaginal cream Place 1 g vaginally 2 (two) times a week.     gabapentin (NEURONTIN) 100 MG capsule Take 2 capsules (200 mg total) by mouth at bedtime. 180 capsule 3   hydrochlorothiazide (HYDRODIURIL) 25 MG tablet TAKE 1 TABLET BY MOUTH EVERY DAY 90 tablet 3   losartan (COZAAR) 100 MG tablet TAKE 1 TABLET BY MOUTH EVERY DAY 90 tablet 3   MAGNESIUM PO Take by mouth daily.     MULTIPLE VITAMIN PO Take 1 capsule by mouth daily.     vitamin C (ASCORBIC ACID) 500 MG tablet Take 500 mg by mouth daily.     Vitamin D, Ergocalciferol, (DRISDOL) 1.25 MG (50000 UNIT) CAPS capsule TAKE 1 CAPSULE (50,000 UNITS TOTAL) BY MOUTH EVERY 7 (SEVEN) DAYS. 12 capsule 0   OVER THE COUNTER MEDICATION Equate Allergy med prn  (Patient not taking: Reported on 02/27/2021)     No facility-administered medications prior to visit.     EXAM:  BP 112/68 (BP Location: Left Arm, Patient Position: Sitting, Cuff Size: Normal)    Pulse 79    Temp 98.3 F (36.8 C) (Oral)    Ht 5' 0.63" (1.54 m)    Wt 164 lb (74.4 kg)    SpO2 96%    BMI 31.37 kg/m   Body mass index is 31.37 kg/m.  GENERAL: vitals reviewed and listed above, alert, oriented, appears well hydrated and in no acute distress HEENT: atraumatic, conjunctiva  clear, no obvious abnormalities on inspection of external nose and ears OP : Masked TMs clear eyes EOMs normal NECK: no obvious masses on inspection palpation thyroid palpable not nodules LUNGS: clear to auscultation bilaterally, no wheezes, rales or rhonchi, good air movement CV: HRRR, no clubbing cyanosis or  peripheral edema nl cap refill do not hear murmur capillary refill looks normal MS: moves all extremities without noticeable focal  abnormality localizes back issues to lumbar sacral without radiation Neurologic nonfocal. PSYCH: pleasant and cooperative, no obvious depression or anxiety Lab Results  Component Value Date   WBC 6.4 01/17/2021   HGB 13.4 01/17/2021    HCT 40.9 01/17/2021   PLT 367.0 01/17/2021   GLUCOSE 95 07/29/2020   CHOL 239 (H) 03/03/2020   TRIG 129.0 03/03/2020   HDL 53.20 03/03/2020   LDLDIRECT  153.2 06/23/2012   LDLCALC 160 (H) 03/03/2020   ALT 24 07/29/2020   AST 21 07/29/2020   NA 142 07/29/2020   K 4.0 07/29/2020   CL 106 07/29/2020   CREATININE 0.69 07/29/2020   BUN 15 07/29/2020   CO2 25 07/29/2020   TSH 0.75 07/29/2020   INR 0.9 11/29/2008   HGBA1C 5.7 02/12/2008   BP Readings from Last 3 Encounters:  02/27/21 112/68  02/09/21 117/62  01/17/21 140/70  ate last night     Record review rheumatology Dr. Tamala Julian sports medicine.  ASSESSMENT AND PLAN:  Discussed the following assessment and plan:  Essential hypertension - Plan: Comprehensive metabolic panel, VITAMIN D 25 Hydroxy (Vit-D Deficiency, Fractures), Lipid panel, Lipid panel, VITAMIN D 25 Hydroxy (Vit-D Deficiency, Fractures), Comprehensive metabolic panel  Medication management - Plan: Comprehensive metabolic panel, VITAMIN D 25 Hydroxy (Vit-D Deficiency, Fractures), Lipid panel, Lipid panel, VITAMIN D 25 Hydroxy (Vit-D Deficiency, Fractures), Comprehensive metabolic panel  Hyperlipidemia, unspecified hyperlipidemia type - Plan: Comprehensive metabolic panel, VITAMIN D 25 Hydroxy (Vit-D Deficiency, Fractures), Lipid panel, Lipid panel, VITAMIN D 25 Hydroxy (Vit-D Deficiency, Fractures), Comprehensive metabolic panel  ANA positive - Plan: Comprehensive metabolic panel, VITAMIN D 25 Hydroxy (Vit-D Deficiency, Fractures), Lipid panel, Lipid panel, VITAMIN D 25 Hydroxy (Vit-D Deficiency, Fractures), Comprehensive metabolic panel  Vitamin D deficiency - Plan: Comprehensive metabolic panel, VITAMIN D 25 Hydroxy (Vit-D Deficiency, Fractures), Lipid panel, Lipid panel, VITAMIN D 25 Hydroxy (Vit-D Deficiency, Fractures), Comprehensive metabolic panel  DDD (degenerative disc disease), lumbar Blood pressure controlled vitamin D deficiency monitoring significant  improvement on supplementation.  Of her musculoskeletal symptoms. Low back pain felt DJD problem. Positive ANA nondiagnostic. Discussed losing 5 pounds to get BMI below 30 production of health risk. Lipid panel pending had side effects of atorvastatin in past. Advise she check with her Medicare part D other options for Shingrix vaccine coverage. -Patient advised to return or notify health care team  if  new concerns arise. Review visit counsel assess and plan  30 minutes  Patient Instructions  Will notify you  of labs when available. Checking vit D level again.   Continue activity  Back hygiene.  BP is good today.   Plan fu  6- 12 months depending on how doing  Last dexa was August 2020   Can repeat next year  .     Standley Brooking. Dent Plantz M.D.

## 2021-02-27 ENCOUNTER — Encounter: Payer: Self-pay | Admitting: Internal Medicine

## 2021-02-27 ENCOUNTER — Ambulatory Visit (INDEPENDENT_AMBULATORY_CARE_PROVIDER_SITE_OTHER): Payer: Medicare Other | Admitting: Internal Medicine

## 2021-02-27 ENCOUNTER — Other Ambulatory Visit: Payer: Self-pay

## 2021-02-27 VITALS — BP 112/68 | HR 79 | Temp 98.3°F | Ht 60.63 in | Wt 164.0 lb

## 2021-02-27 DIAGNOSIS — Z79899 Other long term (current) drug therapy: Secondary | ICD-10-CM | POA: Diagnosis not present

## 2021-02-27 DIAGNOSIS — M5136 Other intervertebral disc degeneration, lumbar region: Secondary | ICD-10-CM | POA: Diagnosis not present

## 2021-02-27 DIAGNOSIS — E785 Hyperlipidemia, unspecified: Secondary | ICD-10-CM

## 2021-02-27 DIAGNOSIS — R768 Other specified abnormal immunological findings in serum: Secondary | ICD-10-CM | POA: Diagnosis not present

## 2021-02-27 DIAGNOSIS — I10 Essential (primary) hypertension: Secondary | ICD-10-CM | POA: Diagnosis not present

## 2021-02-27 DIAGNOSIS — E559 Vitamin D deficiency, unspecified: Secondary | ICD-10-CM

## 2021-02-27 LAB — COMPREHENSIVE METABOLIC PANEL
ALT: 13 U/L (ref 0–35)
AST: 15 U/L (ref 0–37)
Albumin: 3.9 g/dL (ref 3.5–5.2)
Alkaline Phosphatase: 49 U/L (ref 39–117)
BUN: 18 mg/dL (ref 6–23)
CO2: 29 mEq/L (ref 19–32)
Calcium: 10.2 mg/dL (ref 8.4–10.5)
Chloride: 106 mEq/L (ref 96–112)
Creatinine, Ser: 0.75 mg/dL (ref 0.40–1.20)
GFR: 81.4 mL/min (ref >60.00)
Glucose, Bld: 83 mg/dL (ref 70–99)
Potassium: 3.6 mEq/L (ref 3.5–5.1)
Sodium: 141 mEq/L (ref 135–145)
Total Bilirubin: 0.5 mg/dL (ref 0.2–1.2)
Total Protein: 6.2 g/dL (ref 6.0–8.3)

## 2021-02-27 LAB — LIPID PANEL
Cholesterol: 250 mg/dL — ABNORMAL HIGH (ref 0–200)
HDL: 53.2 mg/dL (ref >39.00)
LDL Cholesterol: 179 mg/dL — ABNORMAL HIGH (ref 0–99)
NonHDL: 197.06
Total CHOL/HDL Ratio: 5
Triglycerides: 89 mg/dL (ref 0.0–149.0)
VLDL: 17.8 mg/dL (ref 0.0–40.0)

## 2021-02-27 LAB — VITAMIN D 25 HYDROXY (VIT D DEFICIENCY, FRACTURES): VITD: 51.72 ng/mL (ref 30.00–100.00)

## 2021-02-27 NOTE — Patient Instructions (Signed)
Will notify you  of labs when available. Checking vit D level again.   Continue activity  Back hygiene.  BP is good today.   Plan fu  6- 12 months depending on how doing  Last dexa was August 2020   Can repeat next year  .

## 2021-02-28 ENCOUNTER — Encounter: Payer: Self-pay | Admitting: Family Medicine

## 2021-02-28 ENCOUNTER — Ambulatory Visit (INDEPENDENT_AMBULATORY_CARE_PROVIDER_SITE_OTHER): Payer: Medicare Other | Admitting: Family Medicine

## 2021-02-28 DIAGNOSIS — M62561 Muscle wasting and atrophy, not elsewhere classified, right lower leg: Secondary | ICD-10-CM

## 2021-02-28 DIAGNOSIS — M5136 Other intervertebral disc degeneration, lumbar region: Secondary | ICD-10-CM | POA: Diagnosis not present

## 2021-02-28 NOTE — Progress Notes (Signed)
[place] Phone: [phone] Subjective:   Kaylee Adams, LAT, ATC, am serving as scribe for Dr. Hulan Saas.  I'm seeing this patient by the request  of:  Panosh, Standley Brooking, MD  CC: Low back pain follow-up, knee pain follow-up, right leg weakness  OXB:[id number]   07/29/2020 X-rays pending to further evaluate any other arthritic changes.  Other lab tests ordered.  Patient since I am seeing her seems to have more of the right leg atrophy at this time.  Patient is having some ankle pain in the sinus tarsi but is having more difficulty with walking.  Some mild foot drop is noted and weakness noted today.  Patient states that she has had some atrophy for some time and there is a potential for the lumbar radiculopathy.  I would like to get ABI to further evaluate the vascular aspect of the leg as well as a nerve conduction test of the lower extremities.  Laboratory work-up ordered today to rule out any autoimmune or inflammatory markers that could be contributing.  Encouraged her to continue the gabapentin.  X-rays of the ankle ordered today to further evaluate but I did do not see anything significant at this time.  Follow-up with me again after all the testing and will discuss further treatment.  This may include MRIs or unfortunately referral to neurology to rule out muscular dystrophy  Update 02/28/2021 Kaylee Adams is a 70 y.o. female coming in with complaint of back pain. 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  Today, pt reports f/u of chronic low back that has not changed.  She denies any radiating pain into her LEs and also denies any numbness/tingling in her LEs.  She is currently taking IBU and Tylenol as needed.    Patient does have radicular symptoms noted affecting L2-S1 nerve roots on the right  moderate to severe.  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 Patient is also seeing rheumatology who do not feel that patient's positive ANA is autoimmune.   Past Medical History:  Diagnosis Date  . Allergy    occ takes otc allergy pill  . CAD (coronary artery disease)    a. 11/2005 Cath: nonobs dzs, 30% LAD lesion on cath ;  b. 11/2005 Echo: NL EF;  c. 04/2012 Echo: EF 55-60%, Gr 2 DD;  d. 04/2012 Ex MV: EF 72%, ST depression in recovery with NL perfusion imaging - HTN response to exercise.  . Chest pressure "fatigue" 05/08/2012  . GERD (gastroesophageal reflux disease)   . Hepatic cyst    on ct [place]  . HLD (hyperlipidemia)   . HTN (hypertension)   . Midsternal chest pain    cardiology eval with non-ischemic stress test 04/2012  . Upper airway cough syndrome 04/29/2015   Trial of max gerd rx 04/29/2015 >>>     Past Surgical History:  Procedure Laterality Date  . ABDOMINAL HYSTERECTOMY     fibroid tumors  . BREAST BIOPSY Right   . BREAST EXCISIONAL BIOPSY Right   . child birth x 5    . CHOLECYSTECTOMY    . COLONOSCOPY  01/09/2008  . SHOULDER ARTHROSCOPY WITH ROTATOR CUFF REPAIR AND SUBACROMIAL DECOMPRESSION Right 11/03/2013   Procedure: RIGHT SHOULDER ARTHROSCOPY WITH DEBRIDEMENT,  ROTATOR CUFF REPAIR AND SUBACROMIAL DECOMPRESSION, DISTAL CLAVICLE RESECTION;  Surgeon: Mcarthur Rossetti, MD;  Location: [place];  Service: Orthopedics;  Laterality: Right;  Marland Kitchen VESICOVAGINAL FISTULA CLOSURE W/ TAH     for fibroid tumors   Social History   Socioeconomic History  . Marital status: Married    Spouse name: Not on file  . Number of children: 5  . Years of education: Not on file  . Highest education level: Not on file  Occupational History    Employer: [employer]  Tobacco Use  . Smoking status: Never Smoker  . Smokeless tobacco: Never Used  Vaping Use  . Vaping Use: Never used  Substance and Sexual  Activity  . Alcohol use: No  . Drug use: No  . Sexual activity: Not on file  Other Topics Concern  . Not on file  Social History Narrative   Married, full time Network engineer. 3 pets    [place]   6-8 hours sleep    Right Handed   Lives in a two story home.    Drinks 1 cup of Coffee   Social Determinants of Health   Financial Resource Strain: Low Risk   . Difficulty of Paying Living Expenses: Not hard at all  Food Insecurity: No Food Insecurity  . Worried About Charity fundraiser in the Last Year: Never true  . Ran Out of Food in the Last Year: Never true  Transportation Needs: No Transportation Needs  . Lack of Transportation (Medical): No  . Lack of Transportation (Non-Medical): No  Physical Activity: Insufficiently Active  . Days of Exercise per Week: 3 days  . Minutes of Exercise per Session: 30 min  Stress: Stress Concern Present  . Feeling of Stress : To some extent  Social Connections: Moderately Integrated  . Frequency of Communication with Friends and Family: More than three times a week  . Frequency of Social Gatherings with Friends and Family: Three times a week  . Attends Religious Services: Never  . Active Member of Clubs or Organizations: Yes  . Attends Archivist Meetings: More than 4 times per year  . Marital Status: Married   Allergies  Allergen Reactions  . Lisinopril     REACTION: cough  . Penicillins Nausea Only   Family History  Problem Relation Age of Onset  . Breast cancer Sister   . Thyroid disease Sister   . Hypertension Mother   . Colon cancer Brother 97       dx'd about age 34 per pt-   . Prostate cancer Brother   . Lung cancer Brother   . Liver cancer Brother   . ALS Brother   . Healthy Daughter   . Healthy Daughter   . Healthy Son   . Healthy Son   . Healthy Son   . Colon polyps Neg Hx   . Esophageal cancer Neg Hx   . Rectal cancer Neg Hx   . Stomach cancer Neg Hx      Current Outpatient Medications  (Cardiovascular):  .  amLODipine (NORVASC) 10 MG tablet, TAKE 1 TABLET BY MOUTH EVERY DAY .  hydrochlorothiazide (HYDRODIURIL) 25 MG tablet, TAKE 1 TABLET BY MOUTH EVERY DAY .  losartan (COZAAR) 100 MG tablet, TAKE 1 TABLET BY MOUTH EVERY DAY   Current Outpatient Medications (Analgesics):  .  aspirin 81 MG chewable tablet, Chew by mouth daily.  Current Outpatient Medications (Hematological):  Marland Kitchen  Cyanocobalamin (VITAMIN B-12 PO), Take 5,000 mcg by mouth.  Liquid  Current Outpatient Medications (Other):  .  estradiol (ESTRACE) 0.1 MG/GM vaginal cream, Place 1 g vaginally 2 (two) times a week. .  gabapentin (NEURONTIN) 100 MG capsule, Take 2 capsules (200 mg total) by mouth at bedtime. Marland Kitchen  MAGNESIUM PO, Take by mouth daily. .  MULTIPLE VITAMIN PO, Take 1 capsule by mouth daily. Marland Kitchen  OVER THE COUNTER MEDICATION, Equate Allergy med prn .  vitamin C (ASCORBIC ACID) 500 MG tablet, Take 500 mg by mouth daily. .  Vitamin D, Ergocalciferol, (DRISDOL) 1.25 MG (50000 UNIT) CAPS capsule, TAKE 1 CAPSULE (50,000 UNITS TOTAL) BY MOUTH EVERY 7 (SEVEN) DAYS.   Reviewed prior external information including notes and imaging from  primary care provider As well as notes that were available from care everywhere and other healthcare systems.  Past medical history, social, surgical and family history all reviewed in electronic medical record.  No pertanent information unless stated regarding to the chief complaint.   Review of Systems:  No headache, visual changes, nausea, vomiting, diarrhea, constipation, dizziness, abdominal pain, skin rash, fevers, chills, night sweats, weight loss, swollen lymph nodes, body aches, joint swelling, chest pain, shortness of breath, mood changes. POSITIVE muscle aches  Objective  Blood pressure 108/62, pulse 75, height 5' (1.524 m), weight 165 lb (74.8 kg), SpO2 98 %.   General: No apparent distress alert and oriented x3 mood and affect normal, dressed appropriately.  HEENT:  Pupils equal, extraocular movements intact  Respiratory: Patient's speak in full sentences and does not appear short of breath  Cardiovascular: No lower extremity edema, non tender, no erythema  Gait antalgic gait MSK: Atrophy noted of the right lower extremity Patient's low back mild tenderness noted.  Did not do significant amount of muscle testing today.  [id number]; 15 additional minutes spent for Therapeutic exercises as stated in above notes.  This included exercises focusing on stretching, strengthening, with significant focus on eccentric aspects.   Long term goals include an improvement in range of motion, strength, endurance as well as avoiding reinjury. Low back exercises that included:  Pelvic tilt/bracing instruction to focus on control of the pelvic girdle and lower abdominal muscles  Glute strengthening exercises, focusing on proper firing of the glutes without engaging the low back muscles Proper stretching techniques for maximum relief for the hamstrings, hip flexors, low back and some rotation where tolerated   Patient's frequency would include in 1-2 times a day, 3-5 times a week for a duration of 6-12 weeks.  Proper technique shown and discussed handout in great detail with ATC.  All questions were discussed and answered.     Impression and Recommendations:     The above documentation has been reviewed and is accurate and complete Lyndal Pulley, DO

## 2021-02-28 NOTE — Assessment & Plan Note (Signed)
Patient does have a spinal stenosis noted.  Fairly severe actually at the L5-S1 level.  We discussed potential epidurals with patient declined.  Patient wants to avoid significant amount of different medications.  Patient though is optimistic that she will be able to start increasing her activity.  Discussed potential formal physical therapy but patient would like to try with conservative therapy at this moment and will work on her own.  Patient given home exercises that I think will be beneficial.  Patient work with Product/process development scientist today.  Depending on how patient responds we can continue to consider the possibility of epidurals or unfortunately surgical intervention would be the other necessary thing.  Follow-up with me again 8 weeks

## 2021-02-28 NOTE — Patient Instructions (Addendum)
Good to see you.   Avoid repetitive extension See me again in 2 months

## 2021-03-03 ENCOUNTER — Other Ambulatory Visit: Payer: Self-pay | Admitting: Internal Medicine

## 2021-03-12 NOTE — Progress Notes (Signed)
Normal results except  high cholesterol  more thank last year   You could consider medication. Let me know and we can discuss .   The 10-year ASCVD risk score Mikey Bussing DC Brooke Bonito., et al., 2013) is: 10%   Values used to calculate the score:     Age: 70 years     Sex: Female     Is Non-Hispanic African American: Yes     Diabetic: No     Tobacco smoker: No     Systolic Blood Pressure: 619 mmHg     Is BP treated: Yes     HDL Cholesterol: 53.2 mg/dL     Total Cholesterol: 250 mg/dL

## 2021-03-20 DIAGNOSIS — Z1231 Encounter for screening mammogram for malignant neoplasm of breast: Secondary | ICD-10-CM | POA: Diagnosis not present

## 2021-03-20 DIAGNOSIS — Z7689 Persons encountering health services in other specified circumstances: Secondary | ICD-10-CM | POA: Diagnosis not present

## 2021-05-01 ENCOUNTER — Other Ambulatory Visit: Payer: Self-pay

## 2021-05-01 ENCOUNTER — Ambulatory Visit (INDEPENDENT_AMBULATORY_CARE_PROVIDER_SITE_OTHER): Payer: Medicare Other | Admitting: Internal Medicine

## 2021-05-01 ENCOUNTER — Encounter: Payer: Self-pay | Admitting: Internal Medicine

## 2021-05-01 VITALS — BP 130/66 | HR 66 | Temp 98.5°F | Ht 60.0 in | Wt 162.8 lb

## 2021-05-01 DIAGNOSIS — Z8616 Personal history of COVID-19: Secondary | ICD-10-CM

## 2021-05-01 DIAGNOSIS — E041 Nontoxic single thyroid nodule: Secondary | ICD-10-CM | POA: Diagnosis not present

## 2021-05-01 DIAGNOSIS — M542 Cervicalgia: Secondary | ICD-10-CM

## 2021-05-01 DIAGNOSIS — E042 Nontoxic multinodular goiter: Secondary | ICD-10-CM

## 2021-05-01 DIAGNOSIS — M5030 Other cervical disc degeneration, unspecified cervical region: Secondary | ICD-10-CM

## 2021-05-01 NOTE — Progress Notes (Signed)
Chief Complaint  Patient presents with  . Neck Pain    Patient complains of neck pain, x2 weeks, Deniies any known injury, Pain located on right side of neck,     HPI:  Mild dodiscomfort but     Kaylee Adams 70 y.o. come in for discomfort mild right side of her neck anterior lateral.  No associated radiation injury.  Continuous for a few weeks feels more when turning neck to the right but no new limitation of motion. No injury.  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 She currently does not have shoulder or arm radiating pain.   Also has history of multinodular goiter with a right nodule biopsy in about 2015.  Like a follow-up on that.  Saw endocrinologist at that time follow-up was unclear.  She does not have any new symptoms or feel that she has a neck mass.  Had COVID in the fall asks about booster.  Only travel will be only 5 days to [place].  Has N95's to travel  ROS: See pertinent positives and negatives per HPI.  Past Medical History:  Diagnosis Date  . Allergy    occ takes otc allergy pill  . CAD (coronary artery disease)    a. 11/2005 Cath: nonobs dzs, 30% LAD lesion on cath ;  b. 11/2005 Echo: NL EF;  c. 04/2012 Echo: EF 55-60%, Gr 2 DD;  d. 04/2012 Ex MV: EF 72%, ST depression in recovery with NL perfusion imaging - HTN response to exercise.  . Chest pressure "fatigue" 05/08/2012  . GERD (gastroesophageal reflux disease)   . Hepatic cyst    on ct [place]  . HLD (hyperlipidemia)   . HTN (hypertension)   . Midsternal chest pain    cardiology eval with non-ischemic stress test 04/2012  . Upper airway cough syndrome 04/29/2015   Trial of max gerd rx 04/29/2015 >>>      Family History  Problem Relation Age of Onset  . Breast cancer Sister   . Thyroid disease Sister   . Hypertension Mother   . Colon cancer Brother 63       dx'd about age 65 per pt-   . Prostate cancer Brother   . Lung cancer Brother   . Liver  cancer Brother   . ALS Brother   . Healthy Daughter   . Healthy Daughter   . Healthy Son   . Healthy Son   . Healthy Son   . Colon polyps Neg Hx   . Esophageal cancer Neg Hx   . Rectal cancer Neg Hx   . Stomach cancer Neg Hx     Social History   Socioeconomic History  . Marital status: Married    Spouse name: Not on file  . Number of children: 5  . Years of education: Not on file  . Highest education level: Not on file  Occupational History    Employer: [employer]  Tobacco Use  . Smoking status: Never Smoker  . Smokeless tobacco: Never Used  Vaping Use  . Vaping Use: Never used  Substance and Sexual Activity  . Alcohol use: No  . Drug use: No  . Sexual activity: Not on file  Other Topics Concern  . Not on file  Social History Narrative   Married, full time Network engineer. 3 pets    [place]   6-8 hours sleep    Right Handed   Lives in a two  story home.    Drinks 1 cup of Coffee   Social Determinants of Health   Financial Resource Strain: Low Risk   . Difficulty of Paying Living Expenses: Not hard at all  Food Insecurity: No Food Insecurity  . Worried About Charity fundraiser in the Last Year: Never true  . Ran Out of Food in the Last Year: Never true  Transportation Needs: No Transportation Needs  . Lack of Transportation (Medical): No  . Lack of Transportation (Non-Medical): No  Physical Activity: Insufficiently Active  . Days of Exercise per Week: 3 days  . Minutes of Exercise per Session: 30 min  Stress: Stress Concern Present  . Feeling of Stress : To some extent  Social Connections: Moderately Integrated  . Frequency of Communication with Friends and Family: More than three times a week  . Frequency of Social Gatherings with Friends and Family: Three times a week  . Attends Religious Services: Never  . Active Member of Clubs or Organizations: Yes  . Attends Archivist Meetings: More than 4 times per year  . Marital Status: Married     Outpatient Medications Prior to Visit  Medication Sig Dispense Refill  . amLODipine (NORVASC) 10 MG tablet TAKE 1 TABLET BY MOUTH EVERY DAY 90 tablet 1  . aspirin 81 MG chewable tablet Chew by mouth daily.    . Cyanocobalamin (VITAMIN B-12 PO) Take 5,000 mcg by mouth. Liquid    . estradiol (ESTRACE) 0.1 MG/GM vaginal cream Place 1 g vaginally 2 (two) times a week.    . gabapentin (NEURONTIN) 100 MG capsule Take 2 capsules (200 mg total) by mouth at bedtime. 180 capsule 3  . hydrochlorothiazide (HYDRODIURIL) 25 MG tablet TAKE 1 TABLET BY MOUTH EVERY DAY 90 tablet 3  . losartan (COZAAR) 100 MG tablet TAKE 1 TABLET BY MOUTH EVERY DAY 90 tablet 3  . MAGNESIUM PO Take by mouth daily.    . MULTIPLE VITAMIN PO Take 1 capsule by mouth daily.    Marland Kitchen OVER THE COUNTER MEDICATION Equate Allergy med prn    . vitamin C (ASCORBIC ACID) 500 MG tablet Take 500 mg by mouth daily.    . Vitamin D, Ergocalciferol, (DRISDOL) 1.25 MG (50000 UNIT) CAPS capsule TAKE 1 CAPSULE (50,000 UNITS TOTAL) BY MOUTH EVERY 7 (SEVEN) DAYS. 12 capsule 0   No facility-administered medications prior to visit.     EXAM:  BP 130/66 (BP Location: Left Arm, Patient Position: Sitting, Cuff Size: Normal)   Pulse 66   Temp 98.5 F (36.9 C) (Oral)   Ht 5' (1.524 m)   Wt 162 lb 12.8 oz (73.8 kg)   SpO2 97%   BMI 31.79 kg/m   Body mass index is 31.79 kg/m.  GENERAL: vitals reviewed and listed above, alert, oriented, appears well hydrated and in no acute distress HEENT: atraumatic, conjunctiva  clear, no obvious abnormalities on inspection of external nose and ears OP : Masked NECK: no obvious masses on inspection palpation mildly discomfort at the right sternocleidomastoid but no mass-effect.  Thyroid minimally palpable Neurologic DTRs are present upper and lower extremity. CV: HRRR, no clubbing cyanosis or  peripheral edema nl cap refill  MS: moves all extremities without noticeable focal  abnormality PSYCH: pleasant and  cooperative, no obvious depression or anxiety Lab Results  Component Value Date   WBC 6.4 01/17/2021   HGB 13.4 01/17/2021   HCT 40.9 01/17/2021   PLT 367.0 01/17/2021   GLUCOSE 83 02/27/2021   CHOL  250 (H) 02/27/2021   TRIG 89.0 02/27/2021   HDL 53.20 02/27/2021   LDLDIRECT 153.2 06/23/2012   LDLCALC 179 (H) 02/27/2021   ALT 13 02/27/2021   AST 15 02/27/2021   NA 141 02/27/2021   K 3.6 02/27/2021   CL 106 02/27/2021   CREATININE 0.75 02/27/2021   BUN 18 02/27/2021   CO2 29 02/27/2021   TSH 0.75 07/29/2020   INR 0.9 11/29/2008   HGBA1C 5.7 02/12/2008   BP Readings from Last 3 Encounters:  05/01/21 130/66  02/28/21 108/62  02/27/21 112/68    ASSESSMENT AND PLAN:  Discussed the following assessment and plan:  Neck pain  Multinodular goiter - Plan: US THYROID  Thyroid nodule - hx biopsy - Plan: US THYROID  DDD (degenerative disc disease), cervical  Personal history of COVID-19 - winter 2021 Viewed last ultrasound thyroid and C-spine MRI and record. I think her neck pain is musculoskeletal without alarming findings aware of neck hygiene follow if progressive. Will order updated thyroid ultrasound to check her nodules.  Think her neck pain is separate that her multinodular goiter diagnosis. Regard to immunization  leave at least 6 months between infection and any reimmunization may want to wait for further finding or in high risk travel.  She will use N95 during airborne transport. -Patient advised to return or notify health care team  if  new concerns arise.  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   Will order ultrasound of your thyroid to  check the nodules and goiter.      Standley Brooking. Darryl Blumenstein M.D.

## 2021-05-01 NOTE — Patient Instructions (Signed)
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   Will order ultrasound of your thyroid to  check the nodules and goiter.

## 2021-05-05 ENCOUNTER — Ambulatory Visit
Admission: RE | Admit: 2021-05-05 | Discharge: 2021-05-05 | Disposition: A | Discharge status: Home / Self Care | Payer: Medicare Other | Source: Ambulatory Visit | Attending: Internal Medicine | Admitting: Internal Medicine

## 2021-05-05 ENCOUNTER — Other Ambulatory Visit: Payer: Self-pay

## 2021-05-05 DIAGNOSIS — E010 Iodine-deficiency related diffuse (endemic) goiter: Secondary | ICD-10-CM | POA: Diagnosis not present

## 2021-05-05 DIAGNOSIS — E042 Nontoxic multinodular goiter: Secondary | ICD-10-CM | POA: Diagnosis not present

## 2021-05-05 DIAGNOSIS — E041 Nontoxic single thyroid nodule: Secondary | ICD-10-CM

## 2021-05-06 ENCOUNTER — Encounter (HOSPITAL_BASED_OUTPATIENT_CLINIC_OR_DEPARTMENT_OTHER): Payer: Self-pay | Admitting: *Deleted

## 2021-05-06 ENCOUNTER — Ambulatory Visit (HOSPITAL_COMMUNITY)
Admission: EM | Admit: 2021-05-06 | Discharge: 2021-05-06 | Disposition: A | Discharge status: Home / Self Care | Payer: Medicare Other

## 2021-05-06 ENCOUNTER — Emergency Department (HOSPITAL_BASED_OUTPATIENT_CLINIC_OR_DEPARTMENT_OTHER)
Admission: EM | Admit: 2021-05-06 | Discharge: 2021-05-06 | Disposition: A | Discharge status: Home / Self Care | Payer: Medicare Other | Attending: Emergency Medicine | Admitting: Emergency Medicine

## 2021-05-06 ENCOUNTER — Other Ambulatory Visit: Payer: Self-pay

## 2021-05-06 ENCOUNTER — Emergency Department (HOSPITAL_BASED_OUTPATIENT_CLINIC_OR_DEPARTMENT_OTHER): Payer: Medicare Other | Admitting: Radiology

## 2021-05-06 DIAGNOSIS — M542 Cervicalgia: Secondary | ICD-10-CM | POA: Diagnosis not present

## 2021-05-06 DIAGNOSIS — M7989 Other specified soft tissue disorders: Secondary | ICD-10-CM | POA: Diagnosis not present

## 2021-05-06 DIAGNOSIS — I10 Essential (primary) hypertension: Secondary | ICD-10-CM | POA: Insufficient documentation

## 2021-05-06 DIAGNOSIS — Z79899 Other long term (current) drug therapy: Secondary | ICD-10-CM | POA: Insufficient documentation

## 2021-05-06 DIAGNOSIS — Z7982 Long term (current) use of aspirin: Secondary | ICD-10-CM | POA: Diagnosis not present

## 2021-05-06 DIAGNOSIS — M25511 Pain in right shoulder: Secondary | ICD-10-CM

## 2021-05-06 DIAGNOSIS — I2510 Atherosclerotic heart disease of native coronary artery without angina pectoris: Secondary | ICD-10-CM | POA: Insufficient documentation

## 2021-05-06 DIAGNOSIS — M19011 Primary osteoarthritis, right shoulder: Secondary | ICD-10-CM | POA: Diagnosis not present

## 2021-05-06 MED ORDER — ACETAMINOPHEN 325 MG PO TABS
650.0000 mg | ORAL_TABLET | Freq: Once | ORAL | Status: AC
  Administered 2021-05-06: 650 mg via ORAL
  Filled 2021-05-06: qty 2

## 2021-05-06 NOTE — ED Triage Notes (Addendum)
Pt presents with "lump" on right shoulder and pain in neck. States thinks the lump has gotten worse.   Per Inocencio Homes, NP, pt will need imaging that we do not have at [facility]. Pt agreed to go to [facility] ED.

## 2021-05-06 NOTE — Discharge Instructions (Addendum)
It was our pleasure to provide your ER care today - we hope that you feel better.  Your xrays show arthritic/degenerative changes in the shoulder.   Take acetaminophen or ibuprofen as need.   For the soft tissue swelling/mass area noted, follow up with primary care doctor in 1 week, and have them arrange appropriate follow up.   Return to ER if worse, new symptoms, severe pain, increased swelling, redness, fevers, or other concern.

## 2021-05-06 NOTE — ED Provider Notes (Signed)
[facility] EMERGENCY DEPT Provider Note   CSN: [id number] Arrival date & time: 05/06/21  1550     History Chief Complaint  Patient presents with  . Shoulder Pain    Kaylee Adams is a 70 y.o. female.  Patient states had been having some neck pain, states neck pain seemed to radiating or move to right shoulder, and was feeling area by shoulder noted swollen area of lump to right shoulder anteriorly.  Symptoms present for past few days, acute onset, moderate, persistent. No skin lesions or redness. States swollen/lump is not painful. No hx same. No breast mass or swelling. No fever or chills. Denies radicular pain down arm. No numbness/weakness. No arm swelling.   The history is provided by the patient.  Shoulder Pain Associated symptoms: neck pain   Associated symptoms: no fever        Past Medical History:  Diagnosis Date  . Allergy    occ takes otc allergy pill  . CAD (coronary artery disease)    a. 11/2005 Cath: nonobs dzs, 30% LAD lesion on cath ;  b. 11/2005 Echo: NL EF;  c. 04/2012 Echo: EF 55-60%, Gr 2 DD;  d. 04/2012 Ex MV: EF 72%, ST depression in recovery with NL perfusion imaging - HTN response to exercise.  . Chest pressure "fatigue" 05/08/2012  . GERD (gastroesophageal reflux disease)   . Hepatic cyst    on ct [place]  . HLD (hyperlipidemia)   . HTN (hypertension)   . Midsternal chest pain    cardiology eval with non-ischemic stress test 04/2012  . Upper airway cough syndrome 04/29/2015   Trial of max gerd rx 04/29/2015 >>>      Patient Active Problem List   Diagnosis Date Noted  . Pes cavus 01/29/2021  . DDD (degenerative disc disease), cervical 01/29/2021  . DDD (degenerative disc disease), lumbar 01/29/2021  . Vitamin D deficiency 01/29/2021  . Atrophy of muscle of right lower leg 07/29/2020  . Peroneal cyst, right 06/16/2020  . Degenerative joint disease of knee, right 10/13/2019  . Right hip pain 10/13/2019  . Essential hypertension 04/13/2015   . Hoarseness 04/13/2015  . Low TSH level 07/30/2014  . Neck discomfort 07/30/2014  . Goiter ? right  07/30/2014  . Trouble swallowing hx of  07/30/2014  . Visit for preventive health examination 07/14/2014  . GERD (gastroesophageal reflux disease) 01/01/2014  . Hypokalemia 12/18/2013  . Shortness of breath 12/18/2013  . Complete tear of right rotator cuff 11/03/2013  . Right shoulder pain 06/30/2013  . Skin cyst 07/16/2011  . Cyst 07/16/2011  . HEPATIC CYST 09/12/2009  . ADVERSE REACTION TO MEDICATION 09/12/2009  . BACK PAIN, THORACIC REGION, LEFT 03/28/2009  . CAD, NATIVE VESSEL 12/26/2008  . CHEST PAIN, ATYPICAL 12/02/2008  . INTERNAL HEMORRHOIDS 01/09/2008  . EXTERNAL HEMORRHOIDS 01/09/2008  . LIPOMA NOS 09/16/2007  . Hyperlipemia 09/16/2007    Past Surgical History:  Procedure Laterality Date  . ABDOMINAL HYSTERECTOMY     fibroid tumors  . BREAST BIOPSY Right   . BREAST EXCISIONAL BIOPSY Right   . child birth x 5    . CHOLECYSTECTOMY    . COLONOSCOPY  01/09/2008  . SHOULDER ARTHROSCOPY WITH ROTATOR CUFF REPAIR AND SUBACROMIAL DECOMPRESSION Right 11/03/2013   Procedure: RIGHT SHOULDER ARTHROSCOPY WITH DEBRIDEMENT, ROTATOR CUFF REPAIR AND SUBACROMIAL DECOMPRESSION, DISTAL CLAVICLE RESECTION;  Surgeon: Mcarthur Rossetti, MD;  Location: [facility];  Service: Orthopedics;  Laterality: Right;  Marland Kitchen VESICOVAGINAL FISTULA CLOSURE W/ TAH  for fibroid tumors     OB History   No obstetric history on file.     Family History  Problem Relation Age of Onset  . Breast cancer Sister   . Thyroid disease Sister   . Hypertension Mother   . Colon cancer Brother 34       dx'd about age 65 per pt-   . Prostate cancer Brother   . Lung cancer Brother   . Liver cancer Brother   . ALS Brother   . Healthy Daughter   . Healthy Daughter   . Healthy Son   . Healthy Son   . Healthy Son   . Colon polyps Neg Hx   . Esophageal cancer Neg Hx   . Rectal cancer Neg Hx   . Stomach  cancer Neg Hx     Social History   Tobacco Use  . Smoking status: Never Smoker  . Smokeless tobacco: Never Used  Vaping Use  . Vaping Use: Never used  Substance Use Topics  . Alcohol use: No  . Drug use: No    Home Medications Prior to Admission medications   Medication Sig Start Date End Date Taking? Authorizing Provider  amLODipine (NORVASC) 10 MG tablet TAKE 1 TABLET BY MOUTH EVERY DAY 03/06/21  Yes Panosh, Standley Brooking, MD  aspirin 81 MG chewable tablet Chew by mouth daily.   Yes [provider]  Cyanocobalamin (VITAMIN B-12 PO) Take 5,000 mcg by mouth. Liquid   Yes [provider]  estradiol (ESTRACE) 0.1 MG/GM vaginal cream Place 1 g vaginally 2 (two) times a week. 02/23/21  Yes [provider]  gabapentin (NEURONTIN) 100 MG capsule Take 2 capsules (200 mg total) by mouth at bedtime. 06/16/20  Yes Hulan Saas M, DO  hydrochlorothiazide (HYDRODIURIL) 25 MG tablet TAKE 1 TABLET BY MOUTH EVERY DAY 12/05/20  Yes Burchette, Alinda Sierras, MD  losartan (COZAAR) 100 MG tablet TAKE 1 TABLET BY MOUTH EVERY DAY 12/05/20  Yes Burchette, Alinda Sierras, MD  MAGNESIUM PO Take by mouth daily.   Yes [provider]  MULTIPLE VITAMIN PO Take 1 capsule by mouth daily.   Yes [provider]  OVER THE COUNTER MEDICATION Equate Allergy med prn   Yes [provider]  vitamin C (ASCORBIC ACID) 500 MG tablet Take 500 mg by mouth daily.   Yes [provider]  Vitamin D, Ergocalciferol, (DRISDOL) 1.25 MG (50000 UNIT) CAPS capsule TAKE 1 CAPSULE (50,000 UNITS TOTAL) BY MOUTH EVERY 7 (SEVEN) DAYS. 01/24/21  Yes Lyndal Pulley, DO    Allergies    Lisinopril and Penicillins  Review of Systems   Review of Systems  Constitutional: Negative for fever.  Respiratory: Negative for shortness of breath.   Cardiovascular: Negative for chest pain.  Genitourinary: Negative for flank pain.  Musculoskeletal: Positive for neck pain.       Right posterior-lateral neck  pain.   Skin: Negative for rash and wound.  Neurological: Negative for weakness, numbness and headaches.  Hematological: Does not bruise/bleed easily.  Psychiatric/Behavioral: Negative for confusion.    Physical Exam Updated Vital Signs BP (!) 174/70   Pulse 77   Temp 98.3 F (36.8 C) (Oral)   Resp 20   SpO2 100%   Physical Exam Vitals and nursing note reviewed.  Constitutional:      Appearance: Normal appearance. She is well-developed.  HENT:     Head: Atraumatic.     Nose: Nose normal.     Mouth/Throat:  Mouth: Mucous membranes are moist.  Eyes:     General: No scleral icterus.    Conjunctiva/sclera: Conjunctivae normal.  Neck:     Trachea: No tracheal deviation.     Comments: No midline/spine tenderness. Right trapezius muscular tenderness.  Cardiovascular:     Rate and Rhythm: Normal rate and regular rhythm.     Pulses: Normal pulses.     Heart sounds: Normal heart sounds. No murmur heard. No friction rub. No gallop.   Pulmonary:     Effort: Pulmonary effort is normal. No respiratory distress.     Breath sounds: Normal breath sounds.  Abdominal:     General: There is no distension.     Tenderness: There is no abdominal tenderness.  Genitourinary:    Comments: No cva tenderness.  Musculoskeletal:        General: No swelling or tenderness.       Arms:     Cervical back: Normal range of motion and neck supple. No rigidity. No muscular tenderness.     Comments: Pt with mild fullness, ?fleshy soft tissue mass just anterior/medial to right shoulder 3-4 cm diameter. No fluctuance. No abscess. No tenderness. No erythema.   Lymphadenopathy:     Cervical: No cervical adenopathy.  Skin:    General: Skin is warm and dry.     Findings: No rash.  Neurological:     Mental Status: She is alert.     Comments: Alert, speech normal.   Psychiatric:        Mood and Affect: Mood normal.     ED Results / Procedures / Treatments   Labs (all labs ordered are listed, but  only abnormal results are displayed) Labs Reviewed - No data to display  EKG None  Radiology DG Shoulder Right  Result Date: 05/06/2021 CLINICAL DATA:  Pain and swelling EXAM: RIGHT SHOULDER - 2+ VIEW COMPARISON:  None. FINDINGS: Oblique, Y scapular, and axillary images obtained. No fracture or dislocation. There is mild narrowing of the acromioclavicular joint. The glenohumeral joint appears unremarkable. There is bony overgrowth along the inferior acromioclavicular joint. Soft tissue prominence noted along the superior aspect of the glenohumeral joint. No erosive change. Visualized right lung clear. IMPRESSION: No fracture or dislocation. 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 Bony overgrowth along the inferior acromioclavicular joint potentially places patient at increased risk for impingement syndrome. Soft tissue prominence noted along the superior aspect of the glenohumeral joint measuring 1.3 x 0.6 cm. Suspect arthropathic etiology for this soft tissue fullness; there may be focal joint effusion in this area. Electronically Signed   By: Lowella Grip III M.D.   On: 05/06/2021 19:47      Procedures Procedures   Medications Ordered in ED Medications - No data to display  ED Course  I have reviewed the triage vital signs and the nursing notes.  Pertinent labs & imaging results that were available during my care of the patient were reviewed by me and considered in my medical decision making (see chart for details).    MDM Rules/Calculators/A&P                         Xrays.  Reviewed nursing notes and prior charts for additional history.   Acetaminophen po.   Xrays reviewed/interpreted by me - no fx, neg acute.   Discussed need pcp f/u re soft tissue mass.   Return precautions provided.    Final Clinical Impression(s) / ED Diagnoses Final diagnoses:  None    Rx / DC Orders ED Discharge Orders    None       Lajean Saver, MD 05/06/21 2038

## 2021-05-06 NOTE — ED Triage Notes (Signed)
Patient states that she has lump on right shoulder that she notice a few days ago--pain radiated into shoulder and into right side of neck. Denies any injury. Reports normal range of motion. Denies any new numbness or tingling to right hand.

## 2021-05-08 ENCOUNTER — Telehealth: Payer: Self-pay | Admitting: Internal Medicine

## 2021-05-08 NOTE — Telephone Encounter (Signed)
Pt is calling in stating that she has a lump on R shoulder when she moves her arm it gets real hard and sore and thinks that is it affecting the R side of her neck.  Pt declined to see another provider in the absence of Dr. Regis Bill.  Pt stated that she went to UC on Saturday due to her being very sore and they advised her to go to the ER on [address] and the ER stated they think it is arthritis.  Pt stated that she had a bad experience at [facility] and do not want to be referred there for anything.

## 2021-05-11 NOTE — Telephone Encounter (Signed)
Could not leave voice message due to voicemail box being full.

## 2021-05-15 NOTE — Telephone Encounter (Signed)
I spoke with the patient and she declined an office visit with another provider in the absence of Dr. Regis Bill. Patient stated she wanted to see Dr. Regis Bill because she knows her and her history. Office visit scheduled for 06/13.

## 2021-05-22 NOTE — Telephone Encounter (Signed)
Record reviewed . Has unusal shoulder  Swelling    Hx of pos ana but no other sx of rheum disease  Hs dhd c spine.  Will assess at follow up  Consider back to rheum or ortho

## 2021-05-22 NOTE — Progress Notes (Signed)
Thyroid ultrasound shows stable  nodules   advice  to  follow up ultrasound in 1-2 years  up to 5 years to be sure all stable

## 2021-05-23 DIAGNOSIS — M25819 Other specified joint disorders, unspecified shoulder: Secondary | ICD-10-CM | POA: Diagnosis not present

## 2021-05-23 DIAGNOSIS — M75101 Unspecified rotator cuff tear or rupture of right shoulder, not specified as traumatic: Secondary | ICD-10-CM | POA: Diagnosis not present

## 2021-05-23 DIAGNOSIS — R2231 Localized swelling, mass and lump, right upper limb: Secondary | ICD-10-CM | POA: Diagnosis not present

## 2021-05-24 ENCOUNTER — Encounter: Payer: Self-pay | Admitting: Family Medicine

## 2021-05-24 ENCOUNTER — Other Ambulatory Visit: Payer: Self-pay

## 2021-05-24 ENCOUNTER — Ambulatory Visit (INDEPENDENT_AMBULATORY_CARE_PROVIDER_SITE_OTHER): Payer: Medicare Other | Admitting: Family Medicine

## 2021-05-24 VITALS — BP 120/64 | HR 74 | Temp 98.4°F | Wt 162.4 lb

## 2021-05-24 DIAGNOSIS — R2231 Localized swelling, mass and lump, right upper limb: Secondary | ICD-10-CM | POA: Diagnosis not present

## 2021-05-24 DIAGNOSIS — R221 Localized swelling, mass and lump, neck: Secondary | ICD-10-CM | POA: Diagnosis not present

## 2021-05-24 DIAGNOSIS — E042 Nontoxic multinodular goiter: Secondary | ICD-10-CM | POA: Diagnosis not present

## 2021-05-24 NOTE — Progress Notes (Signed)
   Subjective:    Patient ID: Kaylee Adams, female    DOB: 1951/10/21, 70 y.o.   MRN: [id number]  HPI Here for swelling and discomfort in the neck. She saw Dr. Regis Bill on 05-01-21 for a mild intermittent pain in the right neck area, but neither she nor Dr. Regis Bill could appreciate any swelling or masses in this area on exam. She was sent for a thyroid US on 05-05-21, and this showed no overall enlargement of the gland, but there were several small nodules on both sides that had not changed in 5 years. She was found to have a multinodular goiter in 2015, and one right sided nodule was biopsied then and was found to be benign. Then about 2 weeks ago the patient noticed some swelling begin to appear in the right neck and it has continued to grow slightly larger. The pain is a little more noticeable now. She feels fine in general, no fevers or weight changes. 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    Review of Systems  Constitutional: Negative.   HENT: Negative for sore throat, trouble swallowing and voice change.   Respiratory: Negative.   Cardiovascular: Negative.   Endocrine: Negative.        Objective:   Physical Exam Constitutional:      Appearance: Normal appearance. She is not ill-appearing.  HENT:     Right Ear: Tympanic membrane, ear canal and external ear normal.     Left Ear: Tympanic membrane, ear canal and external ear normal.     Nose: Nose normal.     Mouth/Throat:     Pharynx: Oropharynx is clear.  Eyes:     Conjunctiva/sclera: Conjunctivae normal.  Neck:     Comments: There are 2 masses in the right anterior neck that are mildly tender. One is just beneath the right mandible, and the other is just superior to the right clavicle  Cardiovascular:     Rate and Rhythm: Normal rate and regular rhythm.     Pulses: Normal  pulses.     Heart sounds: Normal heart sounds.  Pulmonary:     Effort: Pulmonary effort is normal.     Breath sounds: Normal breath sounds.  Musculoskeletal:     Cervical back: Normal range of motion. No rigidity.     Comments: There is a smooth mobile non-tender lump in the right proximal upper arm  Neurological:     Mental Status: She is alert.           Assessment & Plan:  She has several enlarging and tender masses in the right anterior neck. These appear to be completely unrelated to a lipomatous mass in the right shoulder area. We will get labs today including a thyroid panel and CBC. We will also set her up for an MRI of the soft tissue of the neck sometime soon. We spent 38 minutes discussing these issues and reviewing records today.  Alysia Penna, MD

## 2021-05-25 ENCOUNTER — Other Ambulatory Visit (INDEPENDENT_AMBULATORY_CARE_PROVIDER_SITE_OTHER): Payer: Medicare Other

## 2021-05-25 DIAGNOSIS — E042 Nontoxic multinodular goiter: Secondary | ICD-10-CM

## 2021-05-25 LAB — CBC WITH DIFFERENTIAL/PLATELET
Basophils Absolute: 0 10*3/uL (ref 0.0–0.1)
Basophils Relative: 0.3 % (ref 0.0–3.0)
Eosinophils Absolute: 0 10*3/uL (ref 0.0–0.7)
Eosinophils Relative: 0 % (ref 0.0–5.0)
HCT: 41.4 % (ref 36.0–46.0)
Hemoglobin: 13.9 g/dL (ref 12.0–15.0)
Lymphocytes Relative: 42.1 % (ref 12.0–46.0)
Lymphs Abs: 2.1 10*3/uL (ref 0.7–4.0)
MCHC: 33.5 g/dL (ref 30.0–36.0)
MCV: 78.7 fl (ref 78.0–100.0)
Monocytes Absolute: 0.5 10*3/uL (ref 0.1–1.0)
Monocytes Relative: 10.5 % (ref 3.0–12.0)
Neutro Abs: 2.3 10*3/uL (ref 1.4–7.7)
Neutrophils Relative %: 47.1 % (ref 43.0–77.0)
Platelets: 329 10*3/uL (ref 150.0–400.0)
RBC: 5.27 Mil/uL — ABNORMAL HIGH (ref 3.87–5.11)
RDW: 13.4 % (ref 11.5–15.5)
WBC: 4.9 10*3/uL (ref 4.0–10.5)

## 2021-05-25 LAB — TSH: TSH: 1.14 u[IU]/mL (ref 0.35–4.50)

## 2021-05-25 LAB — T3, FREE: T3, Free: 3.7 pg/mL (ref 2.3–4.2)

## 2021-05-25 LAB — T4, FREE: Free T4: 0.79 ng/dL (ref 0.60–1.60)

## 2021-05-26 LAB — BASIC METABOLIC PANEL
BUN: 20 mg/dL (ref 6–23)
CO2: 25 mEq/L (ref 19–32)
Calcium: 10.9 mg/dL — ABNORMAL HIGH (ref 8.4–10.5)
Chloride: 104 mEq/L (ref 96–112)
Creatinine, Ser: 0.82 mg/dL (ref 0.40–1.20)
GFR: 73.01 mL/min (ref >60.00)
Glucose, Bld: 82 mg/dL (ref 70–99)
Potassium: 4.1 mEq/L (ref 3.5–5.1)
Sodium: 143 mEq/L (ref 135–145)

## 2021-06-03 ENCOUNTER — Other Ambulatory Visit: Payer: Self-pay

## 2021-06-03 ENCOUNTER — Ambulatory Visit
Admission: RE | Admit: 2021-06-03 | Discharge: 2021-06-03 | Disposition: A | Discharge status: Home / Self Care | Payer: Medicare Other | Source: Ambulatory Visit | Attending: Family Medicine | Admitting: Family Medicine

## 2021-06-03 DIAGNOSIS — R221 Localized swelling, mass and lump, neck: Secondary | ICD-10-CM

## 2021-06-03 DIAGNOSIS — M47812 Spondylosis without myelopathy or radiculopathy, cervical region: Secondary | ICD-10-CM | POA: Diagnosis not present

## 2021-06-03 MED ORDER — GADOBENATE DIMEGLUMINE 529 MG/ML IV SOLN
15.0000 mL | Freq: Once | INTRAVENOUS | Status: AC | PRN
  Administered 2021-06-03: 15 mL via INTRAVENOUS

## 2021-06-04 NOTE — Progress Notes (Signed)
Chief Complaint  Patient presents with   Shoulder Pain    Patient complains of right shoulder pain, x3 weeks    Neck Pain    Patient complains of right neck pain, x3 weeks,    HPI: Kaylee Adams 70 y.o. come in for fu   cough and had pain to ear.   Ongoing  See previous evaluations by colleagues in the office imaging of the neck shows no mass just multinodular goiter. Right shoulder has been evaluated at [facility] and has a partial rotator cuff tear that is probably causing then on the right side. No change in vision no balance issues that are new. Not sure what is wrong with her right ear.  No fevers serious congestion or ongoing cough. ROS: See pertinent positives and negatives per HPI.  Past Medical History:  Diagnosis Date   Allergy    occ takes otc allergy pill   CAD (coronary artery disease)    a. 11/2005 Cath: nonobs dzs, 30% LAD lesion on cath ;  b. 11/2005 Echo: NL EF;  c. 04/2012 Echo: EF 55-60%, Gr 2 DD;  d. 04/2012 Ex MV: EF 72%, ST depression in recovery with NL perfusion imaging - HTN response to exercise.   Chest pressure "fatigue" 05/08/2012   GERD (gastroesophageal reflux disease)    Hepatic cyst    on ct [place]   HLD (hyperlipidemia)    HTN (hypertension)    Midsternal chest pain    cardiology eval with non-ischemic stress test 04/2012   Upper airway cough syndrome 04/29/2015   Trial of max gerd rx 04/29/2015 >>>      Family History  Problem Relation Age of Onset   Breast cancer Sister    Thyroid disease Sister    Hypertension Mother    Colon cancer Brother 69       dx'd about age 58 per pt-    Prostate cancer Brother    Lung cancer Brother    Liver cancer Brother    ALS Brother    Healthy Daughter    Healthy Daughter    Healthy Son    Healthy Son    Healthy Son    Colon polyps Neg Hx    Esophageal cancer Neg Hx    Rectal cancer Neg Hx    Stomach cancer Neg Hx     Social History   Socioeconomic History   Marital status: Married    Spouse name: Not on  file   Number of children: 5   Years of education: Not on file   Highest education level: Not on file  Occupational History    Employer: [employer]  Tobacco Use   Smoking status: Never   Smokeless tobacco: Never  Vaping Use   Vaping Use: Never used  Substance and Sexual Activity   Alcohol use: No   Drug use: No   Sexual activity: Not on file  Other Topics Concern   Not on file  Social History Narrative   Married, full time Network engineer. 3 pets    [place]   6-8 hours sleep    Right Handed   Lives in a two story home.    Drinks 1 cup of Coffee   Social Determinants of Health   Financial Resource Strain: Low Risk    Difficulty of Paying Living Expenses: Not hard at all  Food Insecurity: No Food Insecurity   Worried About Charity fundraiser in the Last Year: Never true   Ran Out  of Food in the Last Year: Never true  Transportation Needs: No Transportation Needs   Lack of Transportation (Medical): No   Lack of Transportation (Non-Medical): No  Physical Activity: Insufficiently Active   Days of Exercise per Week: 3 days   Minutes of Exercise per Session: 30 min  Stress: Stress Concern Present   Feeling of Stress : To some extent  Social Connections: Moderately Integrated   Frequency of Communication with Friends and Family: More than three times a week   Frequency of Social Gatherings with Friends and Family: Three times a week   Attends Religious Services: Never   Active Member of Clubs or Organizations: Yes   Attends Music therapist: More than 4 times per year   Marital Status: Married    Outpatient Medications Prior to Visit  Medication Sig Dispense Refill   amLODipine (NORVASC) 10 MG tablet TAKE 1 TABLET BY MOUTH EVERY DAY 90 tablet 1   aspirin 81 MG chewable tablet Chew by mouth daily.     Cyanocobalamin (VITAMIN B-12 PO) Take 5,000 mcg by mouth. Liquid     estradiol (ESTRACE) 0.1 MG/GM vaginal cream Place 1 g vaginally 2 (two) times a week.      gabapentin (NEURONTIN) 100 MG capsule Take 2 capsules (200 mg total) by mouth at bedtime. 180 capsule 3   hydrochlorothiazide (HYDRODIURIL) 25 MG tablet TAKE 1 TABLET BY MOUTH EVERY DAY 90 tablet 3   losartan (COZAAR) 100 MG tablet TAKE 1 TABLET BY MOUTH EVERY DAY 90 tablet 3   MAGNESIUM PO Take by mouth daily.     MULTIPLE VITAMIN PO Take 1 capsule by mouth daily.     OVER THE COUNTER MEDICATION Equate Allergy med prn     vitamin C (ASCORBIC ACID) 500 MG tablet Take 500 mg by mouth daily.     Vitamin D, Ergocalciferol, (DRISDOL) 1.25 MG (50000 UNIT) CAPS capsule TAKE 1 CAPSULE (50,000 UNITS TOTAL) BY MOUTH EVERY 7 (SEVEN) DAYS. 12 capsule 0   No facility-administered medications prior to visit.     EXAM:  BP 132/66 (BP Location: Left Arm, Patient Position: Sitting, Cuff Size: Normal)   Pulse 67   Temp 98.2 F (36.8 C) (Oral)   Ht 5\' 1"  (1.549 m)   Wt 163 lb (73.9 kg)   SpO2 99%   BMI 30.80 kg/m   Body mass index is 30.8 kg/m.  GENERAL: vitals reviewed and listed above, alert, oriented, appears well hydrated and in no acute distress HEENT: atraumatic, conjunctiva  clear, no obvious abnormalities on inspection of external nose and ears TM looks okay mastoid does not appear to be tender or swollen.  OP :masked  NECK: no obvious masses on inspection palpation except thyroid palpable LUNGS: clear to auscultation bilaterally, no wheezes, rales or rhonchi, good air movement CV: HRRR, no clubbing cyanosis or  peripheral edema nl cap refill   PSYCH: pleasant and cooperative, no obvious depression or anxiety Lab Results  Component Value Date   WBC 4.9 05/25/2021   HGB 13.9 05/25/2021   HCT 41.4 05/25/2021   PLT 329.0 05/25/2021   GLUCOSE 82 05/25/2021   CHOL 250 (H) 02/27/2021   TRIG 89.0 02/27/2021   HDL 53.20 02/27/2021   LDLDIRECT 153.2 06/23/2012   LDLCALC 179 (H) 02/27/2021   ALT 13 02/27/2021   AST 15 02/27/2021   NA 143 05/25/2021   K 4.1 05/25/2021   CL 104  05/25/2021   CREATININE 0.82 05/25/2021   BUN 20 05/25/2021  CO2 25 05/25/2021   TSH 1.14 05/25/2021   INR 0.9 11/29/2008   HGBA1C 5.7 02/12/2008   BP Readings from Last 3 Encounters:  06/05/21 132/66  05/24/21 120/64  05/06/21 129/73   Review of data which includes MRI of head and neck visits over the last week or 2. Note some patchy opacification of the right mastoid cells.  Otherwise no acute findings. ASSESSMENT AND PLAN:  Discussed the following assessment and plan:  Ear symptom - Plan: Ambulatory referral to ENT  Mastoid disorder, right - seen on  mri of neck imaging - Plan: Ambulatory referral to ENT  Multinodular goiter Question or concern that right mastoid or similar is causing some of her symptoms in addition to the other conditions which include degenerative disease of the spine and rotator problems and multinodular goiter.  Because of her right ear discomfort condition which is different on referring to ENT this week hopefully because of concern for mastoid disease causing a problem although I have told her that MRI scan over read. 32 minutes record review evaluation plan orders. Referral placed  -Patient advised to return or notify health care team  if  new concerns arise.  Patient Instructions  Sending you to ENT for the reason we discussed this week  may need  antibiotic  type or other look at your ear.  Suspecting that a combination of conditions causing the  problem on right side . Arthritis in neck  Goiter  Shoulder tear. And right mastoid  condition  that needs evaluation.  Let us know if any fever worsening of symptoms   Standley Brooking. Tai Skelly M.D.

## 2021-06-05 ENCOUNTER — Encounter: Payer: Self-pay | Admitting: Internal Medicine

## 2021-06-05 ENCOUNTER — Other Ambulatory Visit: Payer: Self-pay

## 2021-06-05 ENCOUNTER — Ambulatory Visit (INDEPENDENT_AMBULATORY_CARE_PROVIDER_SITE_OTHER): Payer: Medicare Other | Admitting: Internal Medicine

## 2021-06-05 VITALS — BP 132/66 | HR 67 | Temp 98.2°F | Ht 61.0 in | Wt 163.0 lb

## 2021-06-05 DIAGNOSIS — R6889 Other general symptoms and signs: Secondary | ICD-10-CM

## 2021-06-05 DIAGNOSIS — E042 Nontoxic multinodular goiter: Secondary | ICD-10-CM

## 2021-06-05 DIAGNOSIS — H7491 Unspecified disorder of right middle ear and mastoid: Secondary | ICD-10-CM

## 2021-06-05 NOTE — Patient Instructions (Addendum)
Sending you to ENT for the reason we discussed this week  may need  antibiotic  type or other look at your ear.  Suspecting that a combination of conditions causing the  problem on right side . Arthritis in neck  Goiter  Shoulder tear. And right mastoid  condition  that needs evaluation.  Let us know if any fever worsening of symptoms

## 2021-06-09 ENCOUNTER — Telehealth: Payer: Self-pay | Admitting: Internal Medicine

## 2021-06-09 NOTE — Chronic Care Management (AMB) (Signed)
  Chronic Care Management   Note  06/09/2021 Name: Kaylee Adams MRN: [id number] DOB: Jan 06, 1951  Kaylee Adams is a 70 y.o. year old female who is a primary care patient of Panosh, Standley Brooking, MD. I reached out to Kaylee Adams by phone today in response to a referral sent by Kaylee Adams PCP, Panosh, Standley Brooking, MD.   Kaylee Adams was given information about Chronic Care Management services today including:  CCM service includes personalized support from designated clinical staff supervised by her physician, including individualized plan of care and coordination with other care providers 24/7 contact phone numbers for assistance for urgent and routine care needs. Service will only be billed when office clinical staff spend 20 minutes or more in a month to coordinate care. Only one practitioner may furnish and bill the service in a calendar month. The patient may stop CCM services at any time (effective at the end of the month) by phone call to the office staff.   Patient agreed to services and verbal consent obtained.   Follow up plan:   Tatjana Secretary/administrator

## 2021-06-12 DIAGNOSIS — H6981 Other specified disorders of Eustachian tube, right ear: Secondary | ICD-10-CM | POA: Diagnosis not present

## 2021-06-12 DIAGNOSIS — H6501 Acute serous otitis media, right ear: Secondary | ICD-10-CM | POA: Diagnosis not present

## 2021-06-13 ENCOUNTER — Other Ambulatory Visit: Payer: Self-pay | Admitting: Family Medicine

## 2021-06-27 DIAGNOSIS — D480 Neoplasm of uncertain behavior of bone and articular cartilage: Secondary | ICD-10-CM | POA: Diagnosis not present

## 2021-07-05 DIAGNOSIS — M542 Cervicalgia: Secondary | ICD-10-CM | POA: Diagnosis not present

## 2021-07-05 DIAGNOSIS — M9901 Segmental and somatic dysfunction of cervical region: Secondary | ICD-10-CM | POA: Diagnosis not present

## 2021-07-05 DIAGNOSIS — M791 Myalgia, unspecified site: Secondary | ICD-10-CM | POA: Diagnosis not present

## 2021-07-05 DIAGNOSIS — M50322 Other cervical disc degeneration at C5-C6 level: Secondary | ICD-10-CM | POA: Diagnosis not present

## 2021-07-05 DIAGNOSIS — M25511 Pain in right shoulder: Secondary | ICD-10-CM | POA: Diagnosis not present

## 2021-07-05 DIAGNOSIS — M9902 Segmental and somatic dysfunction of thoracic region: Secondary | ICD-10-CM | POA: Diagnosis not present

## 2021-07-05 DIAGNOSIS — M9903 Segmental and somatic dysfunction of lumbar region: Secondary | ICD-10-CM | POA: Diagnosis not present

## 2021-07-05 DIAGNOSIS — M5136 Other intervertebral disc degeneration, lumbar region: Secondary | ICD-10-CM | POA: Diagnosis not present

## 2021-07-05 DIAGNOSIS — M25512 Pain in left shoulder: Secondary | ICD-10-CM | POA: Diagnosis not present

## 2021-07-12 ENCOUNTER — Ambulatory Visit (INDEPENDENT_AMBULATORY_CARE_PROVIDER_SITE_OTHER): Payer: Medicare Other | Admitting: Otolaryngology

## 2021-07-14 DIAGNOSIS — M5136 Other intervertebral disc degeneration, lumbar region: Secondary | ICD-10-CM | POA: Diagnosis not present

## 2021-07-14 DIAGNOSIS — M542 Cervicalgia: Secondary | ICD-10-CM | POA: Diagnosis not present

## 2021-07-14 DIAGNOSIS — M9902 Segmental and somatic dysfunction of thoracic region: Secondary | ICD-10-CM | POA: Diagnosis not present

## 2021-07-14 DIAGNOSIS — M791 Myalgia, unspecified site: Secondary | ICD-10-CM | POA: Diagnosis not present

## 2021-07-14 DIAGNOSIS — M25511 Pain in right shoulder: Secondary | ICD-10-CM | POA: Diagnosis not present

## 2021-07-14 DIAGNOSIS — M9903 Segmental and somatic dysfunction of lumbar region: Secondary | ICD-10-CM | POA: Diagnosis not present

## 2021-07-14 DIAGNOSIS — M50322 Other cervical disc degeneration at C5-C6 level: Secondary | ICD-10-CM | POA: Diagnosis not present

## 2021-07-14 DIAGNOSIS — M9901 Segmental and somatic dysfunction of cervical region: Secondary | ICD-10-CM | POA: Diagnosis not present

## 2021-07-14 DIAGNOSIS — M25512 Pain in left shoulder: Secondary | ICD-10-CM | POA: Diagnosis not present

## 2021-07-20 ENCOUNTER — Telehealth: Payer: Self-pay | Admitting: Internal Medicine

## 2021-07-20 DIAGNOSIS — M25512 Pain in left shoulder: Secondary | ICD-10-CM | POA: Diagnosis not present

## 2021-07-20 DIAGNOSIS — M5136 Other intervertebral disc degeneration, lumbar region: Secondary | ICD-10-CM | POA: Diagnosis not present

## 2021-07-20 DIAGNOSIS — M9903 Segmental and somatic dysfunction of lumbar region: Secondary | ICD-10-CM | POA: Diagnosis not present

## 2021-07-20 DIAGNOSIS — M25511 Pain in right shoulder: Secondary | ICD-10-CM | POA: Diagnosis not present

## 2021-07-20 DIAGNOSIS — M791 Myalgia, unspecified site: Secondary | ICD-10-CM | POA: Diagnosis not present

## 2021-07-20 DIAGNOSIS — M50322 Other cervical disc degeneration at C5-C6 level: Secondary | ICD-10-CM | POA: Diagnosis not present

## 2021-07-20 DIAGNOSIS — M9902 Segmental and somatic dysfunction of thoracic region: Secondary | ICD-10-CM | POA: Diagnosis not present

## 2021-07-20 DIAGNOSIS — M9901 Segmental and somatic dysfunction of cervical region: Secondary | ICD-10-CM | POA: Diagnosis not present

## 2021-07-20 DIAGNOSIS — M542 Cervicalgia: Secondary | ICD-10-CM | POA: Diagnosis not present

## 2021-07-20 NOTE — Telephone Encounter (Signed)
Spoke with patient to schedule Medicare Annual Wellness Visit (AWV) either virtually or in office.  She stated she would call back to schedule  Last AWV 08/02/20 please schedule at anytime with [facility] Nurse Health Advisor 1 or 2   This should be a 45 minute visit.

## 2021-07-26 ENCOUNTER — Telehealth: Payer: Self-pay | Admitting: Pharmacist

## 2021-07-26 NOTE — Chronic Care Management (AMB) (Signed)
Chronic Care Management Pharmacy Assistant   Name: Kaylee Adams  MRN: [id number] DOB: 1951-04-03    Reason for Encounter: Chart review for initial visit with Clinical Pharmacist on 08-02-2121 via phone call at 3:30   Conditions to be addressed/monitored: HTN and GERD, Hypokalemia, Hyperlipemia, Vitamin D deficiency  Recent office visits:  06-05-2021 Burnis Medin, MD - Patient presented for Ear symptom and other concerns. No medication changes.  05-24-2021 Alysia Penna MD - Patient presented for neck mass and other concerns. No medication changes.   05-01-2021 Panosh, Standley Brooking, MD - Patient presented for neck pain and other concerns. No medication changes.   02-27-2021 Panosh, Standley Brooking, MD - Patient presented for Essential hypertension and other concerns. No medication changes.  Recent consult visits:  06-12-2021 Maryan Rued, DO (Otolaryngology) - Patient presented for acute serous otitis media of right ear . Prescribed Augmentin 875-'125mg'$  for 10 days, Flonase 48mg, and Zofran '4mg'$  PRN  02-28-2021 SLyndal Pulley DO (Sports Med) - Patient presented for degenerative disc disease and atrophy of muscle. No medication changes.   02-09-2021 DBo Merino MD (Rheumatology) - Patient presented for Positive ANA and other issues. Stopped Prednisone '20mg'$  .  Hospital visits:  Medication Reconciliation was completed by comparing discharge summary, patient's EMR and Pharmacy list, and upon discussion with patient.  [facility]. Discharge date was 05-06-2021. Patient was there for 2 hours.   [facility] -[facility] None, advised to use Tylenol/Ibuprofen as needed  Medication Changes at Hospital Discharge: -Changed None  Medications Discontinued at Hospital Discharge: -Stopped None  Medications that remain the same after Hospital Discharge:??  -All other medications will  remain the same.     [facility] to neck and shoulder pain. Discharge date was 05-06-2021. Patient was there for 13 minutes. She was transferred to another facility after leaving  [facility],   Medication Changes at Hospital Discharge: -Changed None  Medications Discontinued at Hospital Discharge: -Stopped None  Medications that remain the same after Hospital Discharge:??  -All other medications will remain the same.   Medications: Outpatient Encounter Medications as of 07/26/2021  Medication Sig   amLODipine (NORVASC) 10 MG tablet TAKE 1 TABLET BY MOUTH EVERY DAY   aspirin 81 MG chewable tablet Chew by mouth daily.   Cyanocobalamin (VITAMIN B-12 PO) Take 5,000 mcg by mouth. Liquid   estradiol (ESTRACE) 0.1 MG/GM vaginal cream Place 1 g vaginally 2 (two) times a week.   gabapentin (NEURONTIN) 100 MG capsule Take 2 capsules (200 mg total) by mouth at bedtime.   hydrochlorothiazide (HYDRODIURIL) 25 MG tablet TAKE 1 TABLET BY MOUTH EVERY DAY   losartan (COZAAR) 100 MG tablet TAKE 1 TABLET BY MOUTH EVERY DAY   MAGNESIUM PO Take by mouth daily.   MULTIPLE VITAMIN PO Take 1 capsule by mouth daily.   OVER THE COUNTER MEDICATION Equate Allergy med prn   vitamin C (ASCORBIC ACID) 500 MG tablet Take 500 mg by mouth daily.   Vitamin D, Ergocalciferol, (DRISDOL) 1.25 MG (50000 UNIT) CAPS capsule TAKE 1 CAPSULE (50,000 UNITS TOTAL) BY MOUTH EVERY 7 (SEVEN) DAYS   No facility-administered encounter medications on file as of 07/26/2021.   Have you seen any other providers since your last visit? Patient reports she had seen a Doctor for her hear   Any changes in your medications  or health? Patient reports was prescribed Amoxicillin and Flonase has finished all of her antiobiotic   Any side effects from any medications? Patient reports no  Do you have an symptoms or problems not managed by your  medications? Patient reports no  Any concerns about your health right now? Patient reports yes, The issue with her Right eat whom she has seen Dr Hartford Poli for is not resolved, She has finished antibiotics and is still feeling as I there is fluid , pressure and pain behind it. Patient also reports issue with lump in her shoulder area, it was determined not to be cancerous, but causes her concern she can feel it moving even when her arm is not moving and she is still, there [place] pain and discomfort in the area also . She would like to know what to do about and for it and figure out its cause.   Has your provider asked that you check blood pressure, blood sugar, or follow special diet at home? Patient reports she does not check her blood pressure on a regular basis but in June it was 127/65 according to her log. For her meals she will have a cup of coffee in the morning and maybe cereal. For lunches and dinner some type of baked meat and vegetable  sometimes includes potato or brown rice. She drinks water and fruit juice if she hasn't had any fruit for the day. She does not each much dairy and is watching her sodium intake.  Do you get any type of exercise on a regular basis? 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  Can you think of a goal you would like to reach for your health? Patient reports she would like to figure out what's going on with the lump in her shoulder and for her cholesterol levels to be normal. She would also like some resolve with her ear. Due to follow up with Dr Fredric Dine soon.  Do you have any problems getting your medications? Patient reports no issues with her pharmacy or getting her medications.   Is there anything that you would like to discuss during the appointment? Patient reports other than her concerns no  Patient aware to  bring medications and supplements to appointment and to expect a reminder call  the day prior.  Patient also noted that she recently got her COVID Booster and unable to update her chart  Fill History : AMLODIPINE BESYLATE 10 MG TAB 06/11/2021 90   ESTRADIOL 0.01% CREAM 02/23/2021 30   GABAPENTIN 100 MG CAPSULE 05/29/2021 90   HYDROCHLOROTHIAZIDE 25 MG TAB 06/11/2021 90   LOSARTAN POTASSIUM 100 MG TAB 06/26/2021 90   VITAMIN D2 1.'25MG'$ (50,000 UNIT) 06/14/2021 83   Care Gaps: Zoster Vaccine- Overdue COVID Booster #3 [employer]) - Patient reports done Flu Vaccine - Overdue  AWV - [name] aware to schedule per notes  Star Rating Drugs: Losartan '100mg'$  - Last filled 06-26-2021 90DS at [place] Pharmacist Assistant [phone]

## 2021-07-27 DIAGNOSIS — M25512 Pain in left shoulder: Secondary | ICD-10-CM | POA: Diagnosis not present

## 2021-07-27 DIAGNOSIS — M50322 Other cervical disc degeneration at C5-C6 level: Secondary | ICD-10-CM | POA: Diagnosis not present

## 2021-07-27 DIAGNOSIS — M9902 Segmental and somatic dysfunction of thoracic region: Secondary | ICD-10-CM | POA: Diagnosis not present

## 2021-07-27 DIAGNOSIS — M5136 Other intervertebral disc degeneration, lumbar region: Secondary | ICD-10-CM | POA: Diagnosis not present

## 2021-07-27 DIAGNOSIS — Z23 Encounter for immunization: Secondary | ICD-10-CM | POA: Diagnosis not present

## 2021-07-27 DIAGNOSIS — M542 Cervicalgia: Secondary | ICD-10-CM | POA: Diagnosis not present

## 2021-07-27 DIAGNOSIS — M9901 Segmental and somatic dysfunction of cervical region: Secondary | ICD-10-CM | POA: Diagnosis not present

## 2021-07-27 DIAGNOSIS — M9903 Segmental and somatic dysfunction of lumbar region: Secondary | ICD-10-CM | POA: Diagnosis not present

## 2021-07-27 DIAGNOSIS — M791 Myalgia, unspecified site: Secondary | ICD-10-CM | POA: Diagnosis not present

## 2021-07-27 DIAGNOSIS — M25511 Pain in right shoulder: Secondary | ICD-10-CM | POA: Diagnosis not present

## 2021-08-01 ENCOUNTER — Telehealth: Payer: Self-pay | Admitting: Pharmacist

## 2021-08-01 NOTE — Chronic Care Management (AMB) (Signed)
    Chronic Care Management Pharmacy Assistant   Name: Kaylee Adams  MRN: [id number] DOB: 01/18/1951  08-01-2021- Patient called to remind of appointment with [name] Clinical Pharmacist on 08-02-2021 at 3:30 via phone call.  Patient aware of appointment date, time, and type of appointment  Patient aware to have all medications, supplements, blood pressure and blood sugar logs to visit.   Care Gaps: Zoster Vaccine- Overdue COVID Booster #3 Therapist, music) - Patient reports done Flu Vaccine - Overdue AWV - [name] aware to schedule per notes   Star Rating Drug: Losartan '100mg'$  - Last filled 06-26-2021 90DS at [facility]    Any gaps in medications fill history? ESTRADIOL 0.01% CREAM 02/23/2021 30    (Patient reports she does not use this cream anymore)     Medications: Outpatient Encounter Medications as of 08/01/2021  Medication Sig   amLODipine (NORVASC) 10 MG tablet TAKE 1 TABLET BY MOUTH EVERY DAY   aspirin 81 MG chewable tablet Chew by mouth daily.   Cyanocobalamin (VITAMIN B-12 PO) Take 5,000 mcg by mouth. Liquid   estradiol (ESTRACE) 0.1 MG/GM vaginal cream Place 1 g vaginally 2 (two) times a week.   gabapentin (NEURONTIN) 100 MG capsule Take 2 capsules (200 mg total) by mouth at bedtime.   hydrochlorothiazide (HYDRODIURIL) 25 MG tablet TAKE 1 TABLET BY MOUTH EVERY DAY   losartan (COZAAR) 100 MG tablet TAKE 1 TABLET BY MOUTH EVERY DAY   MAGNESIUM PO Take by mouth daily.   MULTIPLE VITAMIN PO Take 1 capsule by mouth daily.   OVER THE COUNTER MEDICATION Equate Allergy med prn   vitamin C (ASCORBIC ACID) 500 MG tablet Take 500 mg by mouth daily.   Vitamin D, Ergocalciferol, (DRISDOL) 1.25 MG (50000 UNIT) CAPS capsule TAKE 1 CAPSULE (50,000 UNITS TOTAL) BY MOUTH EVERY 7 (SEVEN) DAYS   No facility-administered encounter medications on file as of 08/01/2021.    [place] Clinical Pharmacist Assistant [phone]

## 2021-08-02 ENCOUNTER — Ambulatory Visit (INDEPENDENT_AMBULATORY_CARE_PROVIDER_SITE_OTHER): Payer: Medicare Other | Admitting: Pharmacist

## 2021-08-02 DIAGNOSIS — I10 Essential (primary) hypertension: Secondary | ICD-10-CM

## 2021-08-02 DIAGNOSIS — E785 Hyperlipidemia, unspecified: Secondary | ICD-10-CM

## 2021-08-02 NOTE — Progress Notes (Signed)
Chronic Care Management Pharmacy Note  08/16/2021 Name:  Kaylee Adams MRN:  [id number] DOB:  1951/09/06  Summary: LDL above goal of < 70 BP is at goal < 130/80 per home readings  Recommendations/Changes made from today's visit: -Recommended high intensity statin based on CAD and LDL > 70 -Recommend switching losartan and HCTZ to combination tablet to minimize bill burden -Recommended moving amlodipine to the evening for CVD benefits and even out BP lowering -Recommend repeat DEXA scan and vitamin D level  Plan: Tolerance assessment in 3-4 weeks Repeat lipid panel in 3 months   Subjective: Kaylee Adams is an 70 y.o. year old female who is a primary patient of Panosh, Standley Brooking, MD.  The CCM team was consulted for assistance with disease management and care coordination needs.    Engaged with patient by telephone for initial visit in response to provider referral for pharmacy case management and/or care coordination services.   Consent to Services:  The patient was given the following information about Chronic Care Management services today, agreed to services, and gave verbal consent: 1. CCM service includes personalized support from designated clinical staff supervised by the primary care provider, including individualized plan of care and coordination with other care providers 2. 24/7 contact phone numbers for assistance for urgent and routine care needs. 3. Service will only be billed when office clinical staff spend 20 minutes or more in a month to coordinate care. 4. Only one practitioner may furnish and bill the service in a calendar month. 5.The patient may stop CCM services at any time (effective at the end of the month) by phone call to the office staff. 6. The patient will be responsible for cost sharing (co-pay) of up to 20% of the service fee (after annual deductible is met). Patient agreed to services and consent obtained.  Patient Care Team: Burnis Medin, MD as PCP -  General Milus Banister, MD (Gastroenterology) Fay Records, MD (Cardiology) Alda Berthold, DO as Consulting Physician (Neurology) Viona Gilmore, [facility] as Pharmacist (Pharmacist)  Recent office visits: 06-05-2021 Burnis Medin, MD - Patient presented for Ear symptom and other concerns. No medication changes.   05-24-2021 Alysia Penna MD - Patient presented for neck mass and other concerns. No medication changes.   05-01-2021 Panosh, Standley Brooking, MD - Patient presented for neck pain and other concerns. No medication changes.   02-27-2021 Panosh, Standley Brooking, MD - Patient presented for Essential hypertension and other concerns. No medication changes.  Recent consult visits: 06-12-2021 Maryan Rued, DO (Otolaryngology) - Patient presented for acute serous otitis media of right ear . Prescribed Augmentin 875-168m for 10 days, Flonase 591m, and Zofran 72m38mRN   02-28-2021 SmiLyndal PulleyO (Sports Med) - Patient presented for degenerative disc disease and atrophy of muscle. No medication changes.   02-09-2021 DevBo MerinoD (Rheumatology) - Patient presented for Positive ANA and other issues. Stopped Prednisone 50m30m Hospital visits: Medication Reconciliation was completed by comparing discharge summary, patient's EMR and Pharmacy list, and upon discussion with patient.   [facility] due to shoulder pain. Discharge date was 05-06-2021. Patient was there for 2 hours.    [facility] -[facility] None, advised to use Tylenol/Ibuprofen as needed   Medication Changes at Hospital Discharge: -Changed None   Medications Discontinued at Hospital Discharge: -Stopped None   Medications that remain the same after Hospital Discharge:?? -All other  medications will remain the same.       Patient visited  [facility] on 05-06-2021 due to neck and shoulder pain. Discharge date was  05-06-2021. Patient was there for 13 minutes. She was transferred to another facility after leaving   New?Medications Started at [facility] Discharge:?? -started None    Medication Changes at Hospital Discharge: -Changed None   Medications Discontinued at Hospital Discharge: -Stopped None   Medications that remain the same after Hospital Discharge:?? -All other medications will remain the same.   Objective:  Lab Results  Component Value Date   CREATININE 0.82 05/25/2021   BUN 20 05/25/2021   GFR 73.01 05/25/2021   GFRNONAA 54 (L) 10/29/2013   GFRAA 63 (L) 10/29/2013   NA 143 05/25/2021   K 4.1 05/25/2021   CALCIUM 10.9 (H) 05/25/2021   CO2 25 05/25/2021   GLUCOSE 82 05/25/2021    Lab Results  Component Value Date/Time   HGBA1C 5.7 02/12/2008 12:30 AM   GFR 73.01 05/25/2021 07:57 AM   GFR 81.40 02/27/2021 10:36 AM    Last diabetic Eye exam: No results found for: HMDIABEYEEXA  Last diabetic Foot exam: No results found for: HMDIABFOOTEX   Lab Results  Component Value Date   CHOL 250 (H) 02/27/2021   HDL 53.20 02/27/2021   LDLCALC 179 (H) 02/27/2021   LDLDIRECT 153.2 06/23/2012   TRIG 89.0 02/27/2021   CHOLHDL 5 02/27/2021    Hepatic Function Latest Ref Rng & Units 02/27/2021 07/29/2020 03/03/2020  Total Protein 6.0 - 8.3 g/dL 6.2 7.1 6.6  Albumin 3.5 - 5.2 g/dL 3.9 - 4.0  AST 0 - 37 U/L '15 21 16  ' ALT 0 - 35 U/L '13 24 17  ' Alk Phosphatase 39 - 117 U/L 49 - 56  Total Bilirubin 0.2 - 1.2 mg/dL 0.5 0.5 0.5  Bilirubin, Direct 0.0 - 0.3 mg/dL - - 0.1    Lab Results  Component Value Date/Time   TSH 1.14 05/25/2021 07:57 AM   TSH 0.75 07/29/2020 09:14 AM   FREET4 0.79 05/25/2021 07:57 AM   FREET4 0.80 02/10/2019 08:57 AM    CBC Latest Ref Rng & Units 05/25/2021 01/17/2021 07/29/2020  WBC 4.0 - 10.5 K/uL 4.9 6.4 4.8  Hemoglobin 12.0 - 15.0 g/dL 13.9 13.4 13.4  Hematocrit 36.0 - 46.0 % 41.4 40.9 41.7  Platelets 150.0 - 400.0 K/uL 329.0 367.0 388    Lab Results   Component Value Date/Time   VD25OH 51.72 02/27/2021 10:36 AM   VD25OH 25 (L) 07/29/2020 09:14 AM    Clinical ASCVD: Yes  The 10-year ASCVD risk score Mikey Bussing DC Jr., et al., 2013) is: 11.8%   Values used to calculate the score:     Age: 70 years     Sex: Female     Is Non-Hispanic African American: Yes     Diabetic: No     Tobacco smoker: No     Systolic Blood Pressure: 287 mmHg     Is BP treated: Yes     HDL Cholesterol: 53.2 mg/dL     Total Cholesterol: 250 mg/dL    Depression screen [facility] 2/9 01/17/2021 08/02/2020 02/26/2020  Decreased Interest 0 0 0  Down, Depressed, Hopeless 0 0 0  PHQ - 2 Score 0 0 0  Altered sleeping - 0 -  Tired, decreased energy - 0 -  Change in appetite - 0 -  Feeling bad or failure about yourself  - 0 -  Trouble concentrating - 0 -  Moving slowly or fidgety/restless - 0 -  Suicidal thoughts - 0 -  PHQ-9 Score - 0 -  Difficult doing work/chores - Not difficult at all -  Some recent data might be hidden      Social History   Tobacco Use  Smoking Status Never  Smokeless Tobacco Never   BP Readings from Last 3 Encounters:  06/05/21 132/66  05/24/21 120/64  05/06/21 129/73   Pulse Readings from Last 3 Encounters:  06/05/21 67  05/24/21 74  05/06/21 81   Wt Readings from Last 3 Encounters:  06/05/21 163 lb (73.9 kg)  05/24/21 162 lb 6.4 oz (73.7 kg)  05/01/21 162 lb 12.8 oz (73.8 kg)   BMI Readings from Last 3 Encounters:  06/05/21 30.80 kg/m  05/24/21 31.72 kg/m  05/01/21 31.79 kg/m    Assessment/Interventions: Review of patient past medical history, allergies, medications, health status, including review of consultants reports, laboratory and other test data, was performed as part of comprehensive evaluation and provision of chronic care management services.   SDOH:  (Social Determinants of Health) assessments and interventions performed: Yes SDOH Interventions    Flowsheet Row Most Recent Value  SDOH Interventions   Financial  Strain Interventions Intervention Not Indicated  Transportation Interventions Intervention Not Indicated      SDOH Screenings   Alcohol Screen: Not on file  Depression (PHQ2-9): Low Risk    PHQ-2 Score: 0  Financial Resource Strain: Low Risk    Difficulty of Paying Living Expenses: Not hard at all  Food Insecurity: Not on file  Housing: Not on file  Physical Activity: Not on file  Social Connections: Not on file  Stress: Not on file  Tobacco Use: Low Risk    Smoking Tobacco Use: Never   Smokeless Tobacco Use: Never  Transportation Needs: No Transportation Needs   Lack of Transportation (Medical): No   Lack of Transportation (Non-Medical): No   Patient gets out and walks every morning and has been walking at least a mile. Patient wants to get a little more physical activity but has been unable to fit that into the day. Patient tries to be careful to avoid straining her back. Patient has been doing a little bit of PT exercises. Patient is active throughout the day and doesn't sit down much.   Patient is doing activities around the house. Patient is not working anymore. Patient is searching for some part time work. 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   Patient is limited with back pain. She first noticed shoulder pain and lump in her shoulder and neck pain and went to urgent care. Lump was confirmed to not be cancerous. Patient reports that it still is painful some. Patient has put an arch in her shoe and this has been helpful.   Patient has been limiting sugar and bread. Patient does have sugar in her coffee and not eating desserts. Patient is eating at home mostly but eats out some. Patient sometimes eats fried foods but not often. Patient eats baked chicken, ground [place], cabbage, broccoli, green beans, and loves corn. Patient is not eating as many vegetables as she would like. Patient does eat some fruit  including bananas, pears, watermelon and strawberries.   Patient forgot to take her aspirin for the last 2 weeks because it wasn't with her other medications.   CCM Care Plan  Allergies  Allergen Reactions   Lisinopril     REACTION: cough  Penicillins Nausea Only    Medications Reviewed Today     Reviewed by Nilda Riggs, CMA (Certified Medical Assistant) on 06/05/21 at [place] List Status: <None>   Medication Order Taking? Sig Documenting Provider Last Dose Status Informant  amLODipine (NORVASC) 10 MG tablet [id number] Yes TAKE 1 TABLET BY MOUTH EVERY DAY Panosh, Standley Brooking, MD Taking Active   aspirin 81 MG chewable tablet [id number] Yes Chew by mouth daily. [provider] Taking Active   Cyanocobalamin (VITAMIN B-12 PO) [id number] Yes Take 5,000 mcg by mouth. Liquid [provider] Taking Active   estradiol (ESTRACE) 0.1 MG/GM vaginal cream [id number] Yes Place 1 g vaginally 2 (two) times a week. [provider] Taking Active   gabapentin (NEURONTIN) 100 MG capsule [id number] Yes Take 2 capsules (200 mg total) by mouth at bedtime. Lyndal Pulley, DO Taking Active   hydrochlorothiazide (HYDRODIURIL) 25 MG tablet [id number] Yes TAKE 1 TABLET BY MOUTH EVERY DAY Burchette, Alinda Sierras, MD Taking Active   losartan (COZAAR) 100 MG tablet [id number] Yes TAKE 1 TABLET BY MOUTH EVERY DAY Eulas Post, MD Taking Active   MAGNESIUM PO [id number] Yes Take by mouth daily. [provider] Taking Active   MULTIPLE VITAMIN PO [id number] Yes Take 1 capsule by mouth daily. [provider] Taking Active   OVER THE Peggs [id number] Yes Equate Allergy med prn [provider] Taking Active   vitamin C (ASCORBIC ACID) 500 MG tablet [id number] Yes Take 500 mg by mouth daily. [provider] Taking Active   Vitamin D, Ergocalciferol, (DRISDOL) 1.25 MG (50000 UNIT) CAPS capsule [id number] Yes TAKE 1 CAPSULE (50,000 UNITS TOTAL) BY MOUTH  EVERY 7 (SEVEN) DAYS. Lyndal Pulley, DO Taking Active             Patient Active Problem List   Diagnosis Date Noted   Pes cavus 01/29/2021   DDD (degenerative disc disease), cervical 01/29/2021   DDD (degenerative disc disease), lumbar 01/29/2021   Vitamin D deficiency 01/29/2021   Atrophy of muscle of right lower leg 07/29/2020   Peroneal cyst, right 06/16/2020   Degenerative joint disease of knee, right 10/13/2019   Right hip pain 10/13/2019   Essential hypertension 04/13/2015   Hoarseness 04/13/2015   Low TSH level 07/30/2014   Neck discomfort 07/30/2014   Goiter ? right  07/30/2014   Trouble swallowing hx of  07/30/2014   Visit for preventive health examination 07/14/2014   GERD (gastroesophageal reflux disease) 01/01/2014   Hypokalemia 12/18/2013   Shortness of breath 12/18/2013   Complete tear of right rotator cuff 11/03/2013   Right shoulder pain 06/30/2013   Skin cyst 07/16/2011   Cyst 07/16/2011   HEPATIC CYST 09/12/2009   ADVERSE REACTION TO MEDICATION 09/12/2009   BACK PAIN, THORACIC REGION, LEFT 03/28/2009   CAD, NATIVE VESSEL 12/26/2008   CHEST PAIN, ATYPICAL 12/02/2008   INTERNAL HEMORRHOIDS 01/09/2008   EXTERNAL HEMORRHOIDS 01/09/2008   LIPOMA NOS 09/16/2007   Hyperlipemia 09/16/2007    Immunization History  Administered Date(s) Administered   Fluad Quad(high Dose 65+) 09/13/2020   Influenza Split 10/26/2013   Influenza, High Dose Seasonal PF 11/13/2018, 08/18/2019   Influenza-Unspecified 10/08/2017, 09/13/2020   PFIZER(Purple Top)SARS-COV-2 Vaccination 03/14/2020, 04/06/2020   PPD Test 03/03/2018   Pneumococcal Conjugate-13 02/01/2017   Pneumococcal Polysaccharide-23 02/07/2018   Tdap 02/01/2017    Conditions to be addressed/monitored:  Hypertension, Hyperlipidemia, Coronary Artery Disease, and GERD  Care Plan : CCM Pharmacy Care Plan  Updates made  by Viona Gilmore, [place] since 08/16/2021 12:00 AM     Problem: Problem: Hypertension,  Hyperlipidemia, Coronary Artery Disease, and GERD      Long-Range Goal: Patient-Specific Goal   Start Date: 08/02/2021  Expected End Date: 08/02/2022  This Visit's Progress: On track  Priority: High  Note:   Current Barriers:  Unable to independently monitor therapeutic efficacy Unable to achieve control of cholesterol   Pharmacist Clinical Goal(s):  Patient will achieve adherence to monitoring guidelines and medication adherence to achieve therapeutic efficacy achieve control of cholesterol as evidenced by next lipid panel  through collaboration with PharmD and provider.   Interventions: 1:1 collaboration with Panosh, Standley Brooking, MD regarding development and update of comprehensive plan of care as evidenced by provider attestation and co-signature Inter-disciplinary care team collaboration (see longitudinal plan of care) Comprehensive medication review performed; medication list updated in electronic medical record  Hypertension (BP goal <130/80) -Not ideally controlled -Current treatment: Amlodipine 10 mg 1 tablet daily Hydrochlorothiazide 25 mg 1 tablet daily Losartan 100 mg 1 tablet daily -Medications previously tried: lisinopril (cough)  -Current home readings: 127/65, 150/65 (stressful day), 121/56, 121/62 (arm cuff) -Current dietary habits: does not add salt when she is cooking; doesn't use a lot of fresh vegetables and more -Current exercise habits: walking daily -Denies hypotensive/hypertensive symptoms -Educated on BP goals and benefits of medications for prevention of heart attack, stroke and kidney damage; Exercise goal of 150 minutes per week; Importance of home blood pressure monitoring; Proper BP monitoring technique; -Counseled to monitor BP at home weekly, document, and provide log at future appointments -Counseled on diet and exercise extensively Recommended to continue current medication Recommended moving amlodipine to the evening to spread out BP  medications.  Hyperlipidemia: (LDL goal < 70) -Uncontrolled -Current treatment: No medications -Medications previously tried: none  -Current dietary patterns: trying to eat healthier -Current exercise habits: walking daily -Educated on Cholesterol goals;  Benefits of statin for ASCVD risk reduction; Importance of limiting foods high in cholesterol; Exercise goal of 150 minutes per week; -Counseled on diet and exercise extensively Recommended starting high intensity statin therapy.  CAD (Goal: prevent heart events) -Controlled -Current treatment  Aspirin 81 mg 1 tablet daily -Medications previously tried: none  -Recommended to continue current medication Counseled on use of Tylenol for pain and avoidance of other NSAIDs  Osteopenia (Goal prevent fractures) -Not ideally controlled -Last DEXA Scan: 07/2019   T-Score femoral neck: -1.2  T-Score total hip: n/a  T-Score lumbar spine: -0.9  T-Score forearm radius: n/a  10-year probability of major osteoporotic fracture: 3.9%  10-year probability of hip fracture: 0.4% -Patient is not a candidate for pharmacologic treatment -Current treatment  Vitamin D 50000 units 1 capsule every 7 days Multivitamin daily (380 mg calcium) -Medications previously tried: none  -Recommend [phone] units of vitamin D daily. Recommend 1200 mg of calcium daily from dietary and supplemental sources. Recommend weight-bearing and muscle strengthening exercises for building and maintaining bone density. -Counseled on diet and exercise extensively Recommended repeat DEXA.   Health Maintenance -Vaccine gaps: shingrix, COVID booster  -Current therapy:  Magnesium needs to take 2-3 days a week Multivitamin 1 tablet daily (25 mcg vitamin D) Vitamin C 500 mg 2-3 days a week Vitamin B12 5000 mcg 2-3 days a week Equate allergy Turmeric daily -Educated on Cost vs benefit of each product must be carefully weighed by individual consumer -Patient is satisfied  with current therapy and denies issues -Recommended to continue current medication  Patient Goals/Self-Care  Activities Patient will:  - take medications as prescribed check blood pressure at least weekly, document, and provide at future appointments target a minimum of 150 minutes of moderate intensity exercise weekly engage in dietary modifications by increasing fiber intake.  Follow Up Plan: Telephone follow up appointment with care management team member scheduled for:       Medication Assistance: None required.  Patient affirms current coverage meets needs.  Compliance/Adherence/Medication fill history: Care Gaps: Shingrix, COVID booster  Star-Rating Drugs: Losartan 147m - Last [facility]26-2021 [facility]  Patient[facility]  [id number] [id number] [id number]: [phone]: [phone] pill box? Yes Pt endorses 100% compliance  We discussed: Current pharmacy is preferred with insurance plan and patient is satisfied with pharmacy services Patient decided to: Continue current medication management strategy  Care Plan and Follow Up Patient Decision:  Patient agrees to Care Plan and Follow-up.  Plan: Telephone follow up appointment with care management team member scheduled for:  4 months  MaJeni SallesPharmD, BCPendletonharmacist [place] Br[phone]

## 2021-08-03 DIAGNOSIS — M50322 Other cervical disc degeneration at C5-C6 level: Secondary | ICD-10-CM | POA: Diagnosis not present

## 2021-08-03 DIAGNOSIS — M9901 Segmental and somatic dysfunction of cervical region: Secondary | ICD-10-CM | POA: Diagnosis not present

## 2021-08-03 DIAGNOSIS — M25512 Pain in left shoulder: Secondary | ICD-10-CM | POA: Diagnosis not present

## 2021-08-03 DIAGNOSIS — M5136 Other intervertebral disc degeneration, lumbar region: Secondary | ICD-10-CM | POA: Diagnosis not present

## 2021-08-03 DIAGNOSIS — M542 Cervicalgia: Secondary | ICD-10-CM | POA: Diagnosis not present

## 2021-08-03 DIAGNOSIS — M9903 Segmental and somatic dysfunction of lumbar region: Secondary | ICD-10-CM | POA: Diagnosis not present

## 2021-08-03 DIAGNOSIS — M9902 Segmental and somatic dysfunction of thoracic region: Secondary | ICD-10-CM | POA: Diagnosis not present

## 2021-08-03 DIAGNOSIS — E785 Hyperlipidemia, unspecified: Secondary | ICD-10-CM

## 2021-08-03 DIAGNOSIS — M791 Myalgia, unspecified site: Secondary | ICD-10-CM | POA: Diagnosis not present

## 2021-08-03 DIAGNOSIS — M25511 Pain in right shoulder: Secondary | ICD-10-CM | POA: Diagnosis not present

## 2021-08-04 MED ORDER — ROSUVASTATIN CALCIUM 10 MG PO TABS: ORAL_TABLET | ORAL | 1 refills | Status: DC

## 2021-08-04 NOTE — Telephone Encounter (Signed)
Ok Please send in 10 mg  crestor Disp 90 refill x 1  , SIg : take 1 po 3 x per week and increase to once a day.after 2 weeks . Arrange  lipid panel fasting in 3-4 months  to see  effects .

## 2021-08-04 NOTE — Telephone Encounter (Signed)
Crestor '10mg'$  sent to pt's pharmacy. Lipid panel placed and pt is aware.

## 2021-08-08 IMAGING — MG DIGITAL SCREENING BILAT W/ TOMO W/ CAD
6 of 12 series · 6 of 36 positions shown · non-contrast
Comparison: Previous exam(s).

CLINICAL DATA: Screening. Prior benign right breast excision.

EXAM:
DIGITAL SCREENING BILATERAL MAMMOGRAM WITH TOMO AND CAD

[R MLO synth-2D (1 of 2)]
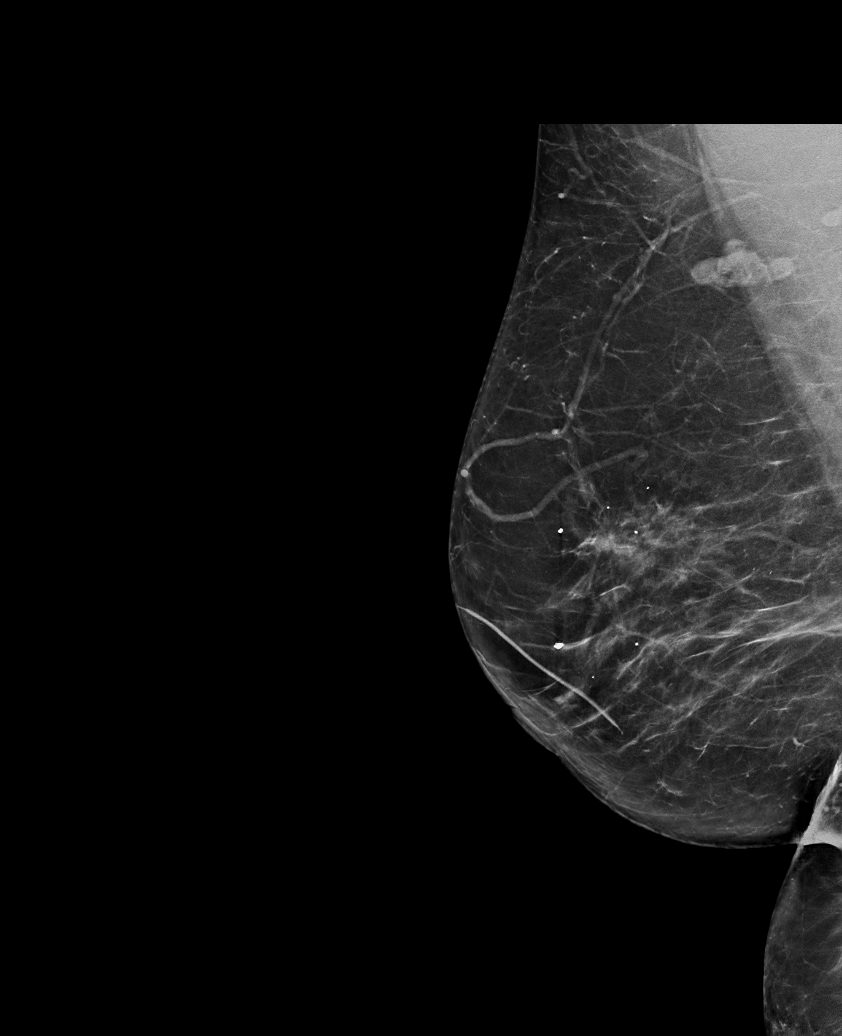

[L CC synth-2D]
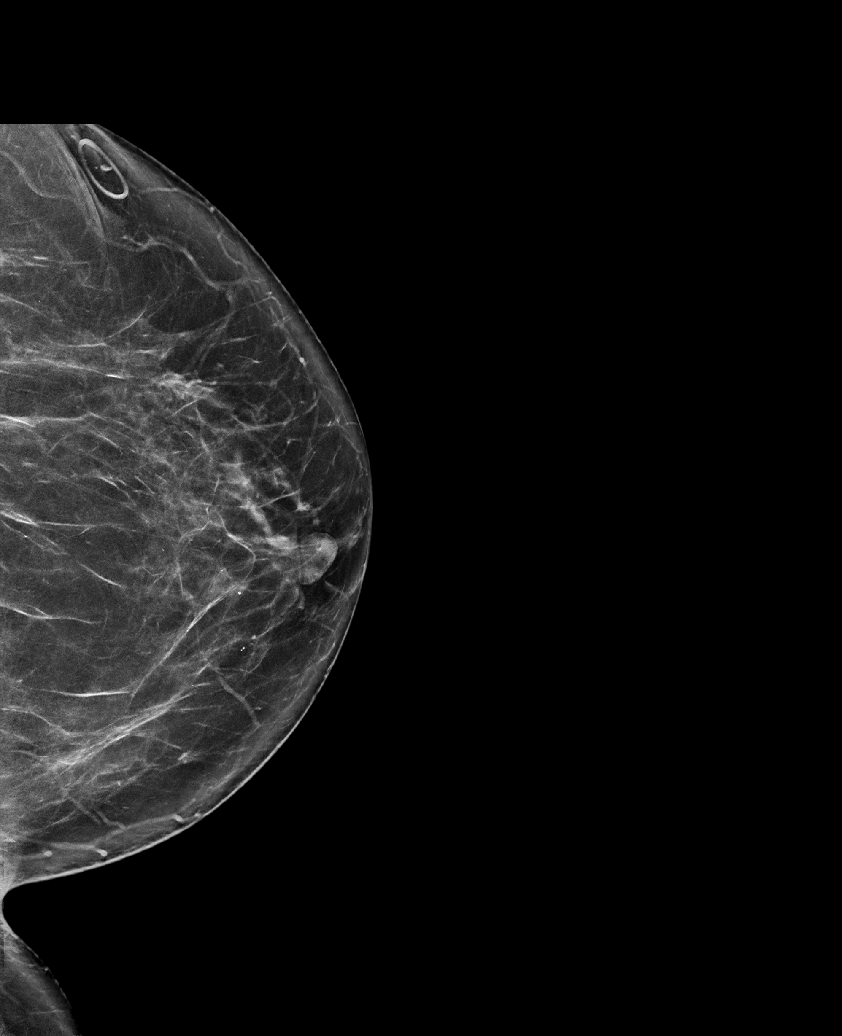

[L MLO synth-2D (1 of 2)]
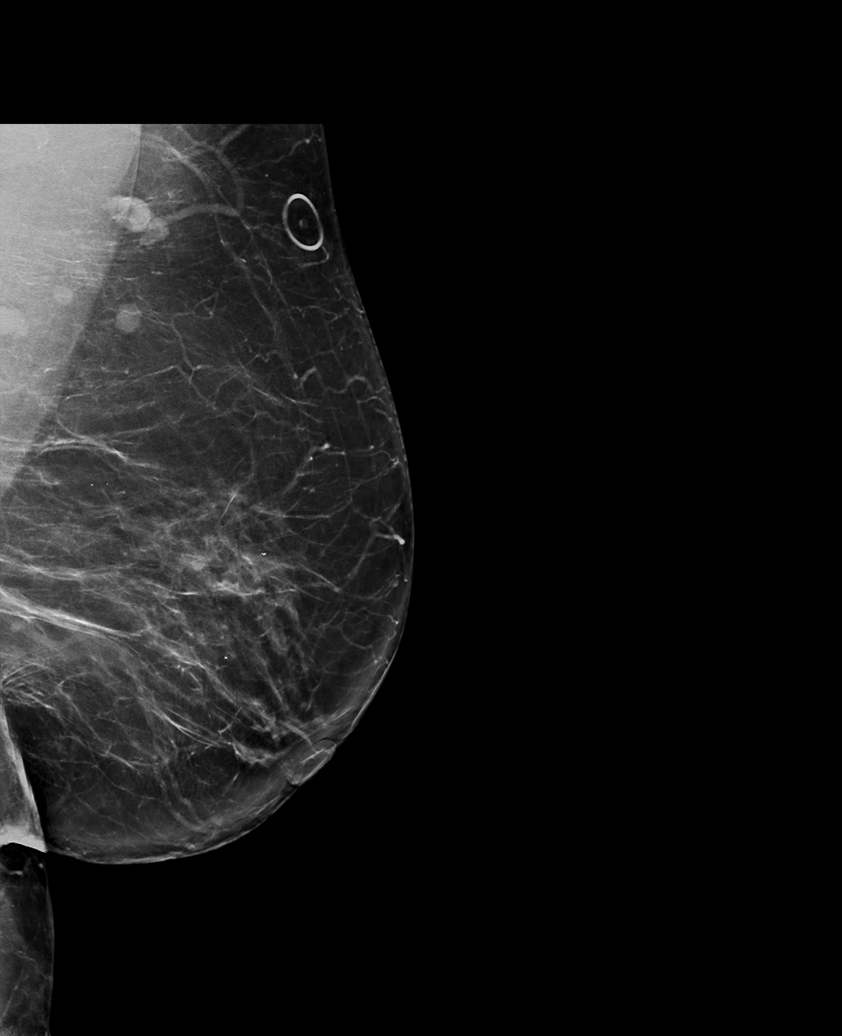

[R MLO synth-2D (2 of 2)]
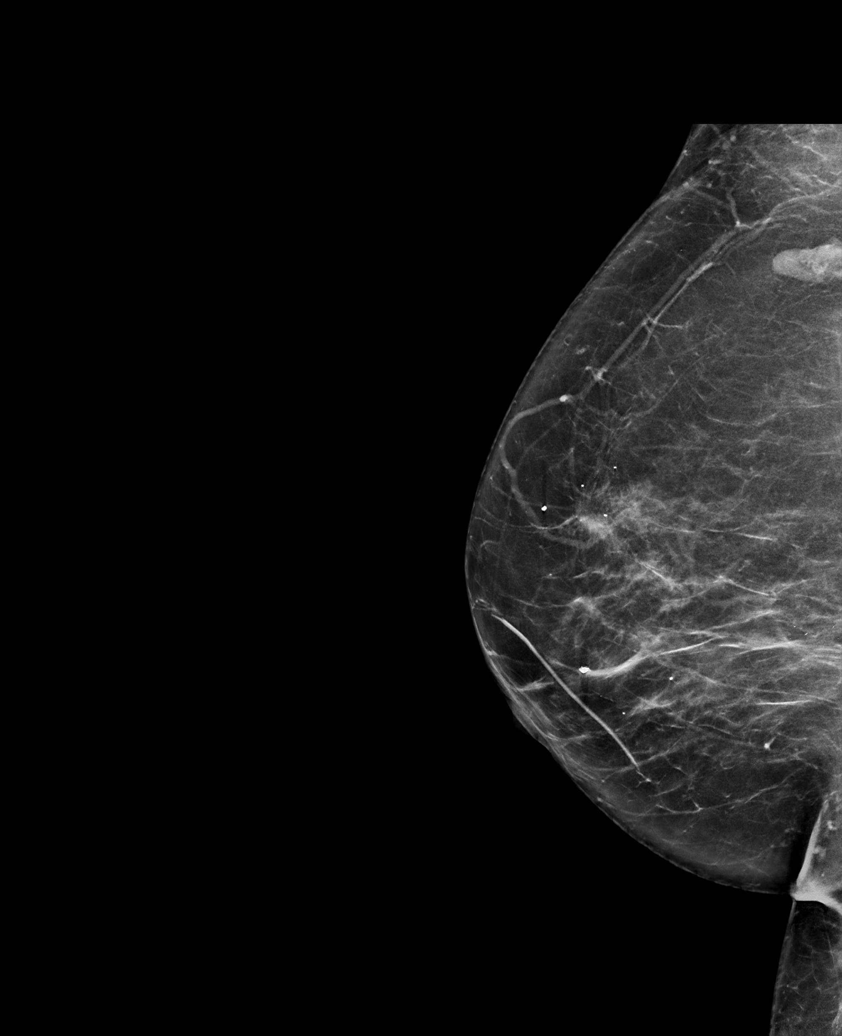

[R CC synth-2D]
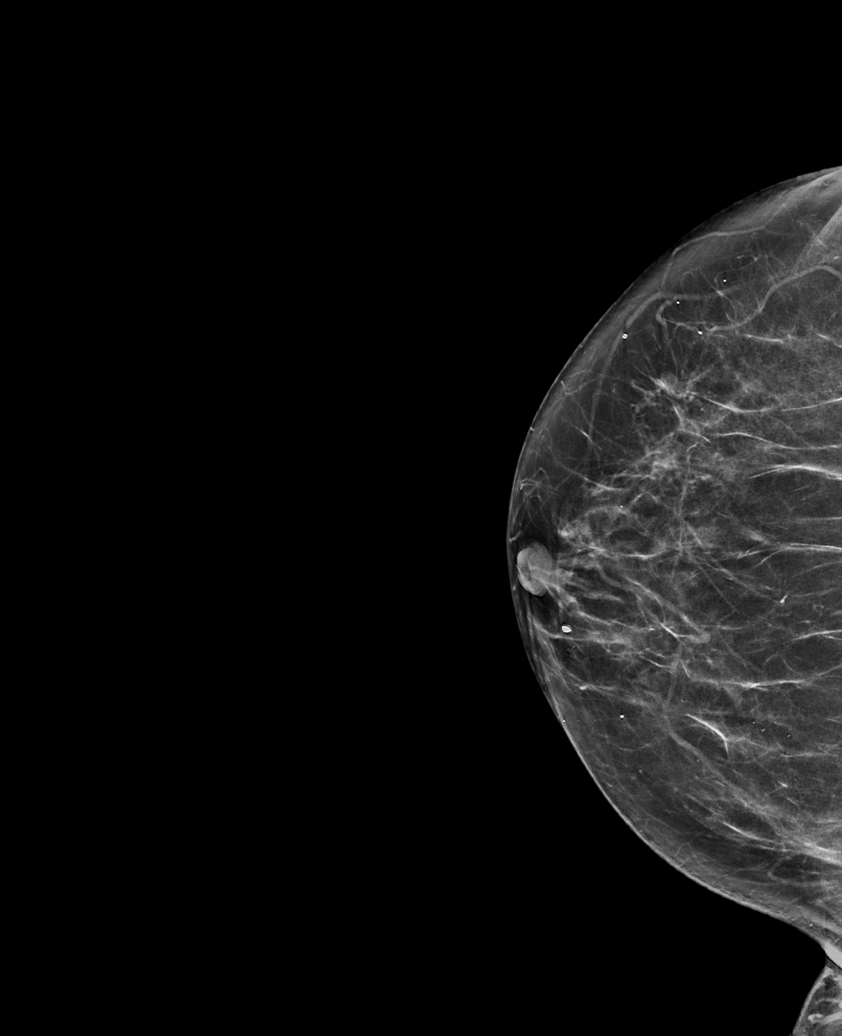

[L MLO synth-2D (2 of 2)]
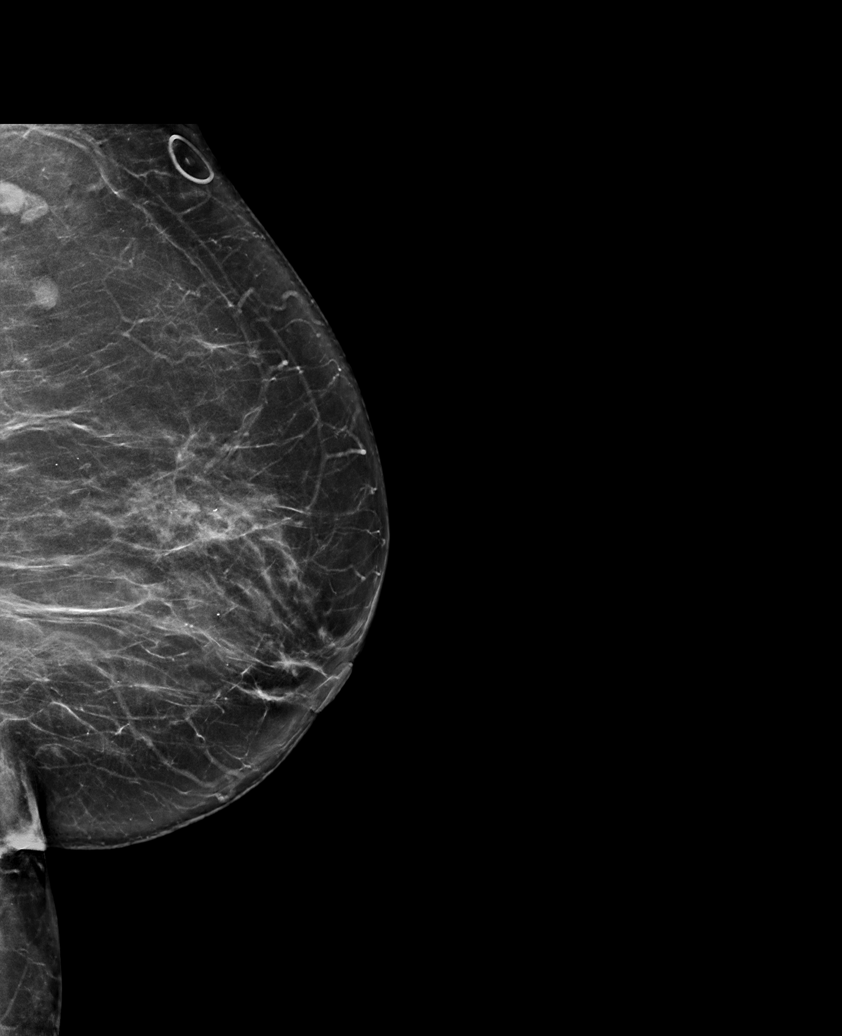

[6 of 36 positions shown; findings below may reference images not displayed]

ACR Breast Density Category b: There are scattered areas of
fibroglandular density.
FINDINGS: There are no findings suspicious for malignancy. Images were
processed with CAD.
IMPRESSION: No mammographic evidence of malignancy. A result letter of this
screening mammogram will be mailed directly to the patient.

RECOMMENDATION:
Screening mammogram in one year. (Code:R8-O-VGT)

BI-RADS CATEGORY  1: Negative.

## 2021-08-10 DIAGNOSIS — M25512 Pain in left shoulder: Secondary | ICD-10-CM | POA: Diagnosis not present

## 2021-08-10 DIAGNOSIS — M9902 Segmental and somatic dysfunction of thoracic region: Secondary | ICD-10-CM | POA: Diagnosis not present

## 2021-08-10 DIAGNOSIS — M9901 Segmental and somatic dysfunction of cervical region: Secondary | ICD-10-CM | POA: Diagnosis not present

## 2021-08-10 DIAGNOSIS — M791 Myalgia, unspecified site: Secondary | ICD-10-CM | POA: Diagnosis not present

## 2021-08-10 DIAGNOSIS — M5136 Other intervertebral disc degeneration, lumbar region: Secondary | ICD-10-CM | POA: Diagnosis not present

## 2021-08-10 DIAGNOSIS — M9903 Segmental and somatic dysfunction of lumbar region: Secondary | ICD-10-CM | POA: Diagnosis not present

## 2021-08-10 DIAGNOSIS — M25511 Pain in right shoulder: Secondary | ICD-10-CM | POA: Diagnosis not present

## 2021-08-10 DIAGNOSIS — M542 Cervicalgia: Secondary | ICD-10-CM | POA: Diagnosis not present

## 2021-08-10 DIAGNOSIS — M50322 Other cervical disc degeneration at C5-C6 level: Secondary | ICD-10-CM | POA: Diagnosis not present

## 2021-08-14 DIAGNOSIS — H903 Sensorineural hearing loss, bilateral: Secondary | ICD-10-CM | POA: Diagnosis not present

## 2021-08-14 DIAGNOSIS — H6981 Other specified disorders of Eustachian tube, right ear: Secondary | ICD-10-CM | POA: Diagnosis not present

## 2021-08-16 NOTE — Patient Instructions (Addendum)
Hi Kaylee Adams,  It was great to get to meet you over the telephone! Below is a summary of some of the topics we discussed.   Please reach out to me if you have any questions or need anything before our follow up!  Best, Maddie  [name], PharmD, [facility]   Visit Information   Goals Addressed             This Visit's Progress    Track and Manage My Blood Pressure-Hypertension       Timeframe:  Short-Term Goal Priority:  Medium Start Date:                             Expected End Date:                       Follow Up Date 12/07/21    - check blood pressure weekly - choose a place to take my blood pressure (home, clinic or office, retail store) - write blood pressure results in a log or diary    Why is this important?   You won't feel high blood pressure, but it can still hurt your blood vessels.  High blood pressure can cause heart or kidney problems. It can also cause a stroke.  Making lifestyle changes like losing a little weight or eating less salt will help.  Checking your blood pressure at home and at different times of the day can help to control blood pressure.  If the doctor prescribes medicine remember to take it the way the doctor ordered.  Call the office if you cannot afford the medicine or if there are questions about it.     Notes:        Patient Care Plan: CCM Pharmacy Care Plan     Problem Identified: Problem: Hypertension, Hyperlipidemia, Coronary Artery Disease, and GERD      Long-Range Goal: Patient-Specific Goal   Start Date: 08/02/2021  Expected End Date: 08/02/2022  This Visit's Progress: On track  Priority: High  Note:   Current Barriers:  Unable to independently monitor therapeutic efficacy Unable to achieve control of cholesterol   Pharmacist Clinical Goal(s):  Patient will achieve adherence to monitoring guidelines and medication adherence to achieve therapeutic  efficacy achieve control of cholesterol as evidenced by next lipid panel  through collaboration with PharmD and provider.   Interventions: 1:1 collaboration with Panosh, Standley Brooking, MD regarding development and update of comprehensive plan of care as evidenced by provider attestation and co-signature Inter-disciplinary care team collaboration (see longitudinal plan of care) Comprehensive medication review performed; medication list updated in electronic medical record  Hypertension (BP goal <130/80) -Not ideally controlled -Current treatment: Amlodipine 10 mg 1 tablet daily Hydrochlorothiazide 25 mg 1 tablet daily Losartan 100 mg 1 tablet daily -Medications previously tried: lisinopril (cough)  -Current home readings: 127/65, 150/65 (stressful day), 121/56, 121/62 (arm cuff) -Current dietary habits: does not add salt when she is cooking; doesn't use a lot of fresh vegetables and more -Current exercise habits: walking daily -Denies hypotensive/hypertensive symptoms -Educated on BP goals and benefits of medications for prevention of heart attack, stroke and kidney damage; Exercise goal of 150 minutes per week; Importance of home blood pressure monitoring; Proper BP monitoring technique; -Counseled to monitor BP at home weekly, document, and provide log at future appointments -Counseled on diet and exercise extensively Recommended to continue current medication Recommended moving  amlodipine to the evening to spread out BP medications.  Hyperlipidemia: (LDL goal < 70) -Uncontrolled -Current treatment: No medications -Medications previously tried: none  -Current dietary patterns: trying to eat healthier -Current exercise habits: walking daily -Educated on Cholesterol goals;  Benefits of statin for ASCVD risk reduction; Importance of limiting foods high in cholesterol; Exercise goal of 150 minutes per week; -Counseled on diet and exercise extensively Recommended starting high intensity  statin therapy.  CAD (Goal: prevent heart events) -Controlled -Current treatment  Aspirin 81 mg 1 tablet daily -Medications previously tried: none  -Recommended to continue current medication Counseled on use of Tylenol for pain and avoidance of other NSAIDs  Osteopenia (Goal prevent fractures) -Not ideally controlled -Last DEXA Scan: 07/2019   T-Score femoral neck: -1.2  T-Score total hip: n/a  T-Score lumbar spine: -0.9  T-Score forearm radius: n/a  10-year probability of major osteoporotic fracture: 3.9%  10-year probability of hip fracture: 0.4% -Patient is not a candidate for pharmacologic treatment -Current treatment  Vitamin D 50000 units 1 capsule every 7 days Multivitamin daily (380 mg calcium) -Medications previously tried: none  -Recommend [phone] units of vitamin D daily. Recommend 1200 mg of calcium daily from dietary and supplemental sources. Recommend weight-bearing and muscle strengthening exercises for building and maintaining bone density. -Counseled on diet and exercise extensively Recommended repeat DEXA.   Health Maintenance -Vaccine gaps: shingrix, COVID booster  -Current therapy:  Magnesium needs to take 2-3 days a week Multivitamin 1 tablet daily (25 mcg vitamin D) Vitamin C 500 mg 2-3 days a week Vitamin B12 5000 mcg 2-3 days a week Equate allergy Turmeric daily -Educated on Cost vs benefit of each product must be carefully weighed by individual consumer -Patient is satisfied with current therapy and denies issues -Recommended to continue current medication  Patient Goals/Self-Care Activities Patient will:  - take medications as prescribed check blood pressure at least weekly, document, and provide at future appointments target a minimum of 150 minutes of moderate intensity exercise weekly engage in dietary modifications by increasing fiber intake.  Follow Up Plan: Telephone follow up appointment with care management team member scheduled  for:      Ms. Rogne was given information about Chronic Care Management services today including:  CCM service includes personalized support from designated clinical staff supervised by her physician, including individualized plan of care and coordination with other care providers 24/7 contact phone numbers for assistance for urgent and routine care needs. Standard insurance, coinsurance, copays and deductibles apply for chronic care management only during months in which we provide at least 20 minutes of these services. Most insurances cover these services at 100%, however patients may be responsible for any copay, coinsurance and/or deductible if applicable. This service may help you avoid the need for more expensive face-to-face services. Only one practitioner may furnish and bill the service in a calendar month. The patient may stop CCM services at any time (effective at the end of the month) by phone call to the office staff.  Patient agreed to services and verbal consent obtained.   Patient verbalizes understanding of instructions provided today and agrees to view in [place].  Telephone follow up appointment with pharmacy team member scheduled for: 4 months  Viona Gilmore, [facility]

## 2021-08-17 ENCOUNTER — Other Ambulatory Visit: Payer: Self-pay

## 2021-08-17 ENCOUNTER — Telehealth: Payer: Self-pay

## 2021-08-17 DIAGNOSIS — M5136 Other intervertebral disc degeneration, lumbar region: Secondary | ICD-10-CM | POA: Diagnosis not present

## 2021-08-17 DIAGNOSIS — M9902 Segmental and somatic dysfunction of thoracic region: Secondary | ICD-10-CM | POA: Diagnosis not present

## 2021-08-17 DIAGNOSIS — M9901 Segmental and somatic dysfunction of cervical region: Secondary | ICD-10-CM | POA: Diagnosis not present

## 2021-08-17 DIAGNOSIS — M25511 Pain in right shoulder: Secondary | ICD-10-CM | POA: Diagnosis not present

## 2021-08-17 DIAGNOSIS — M25512 Pain in left shoulder: Secondary | ICD-10-CM | POA: Diagnosis not present

## 2021-08-17 DIAGNOSIS — M50322 Other cervical disc degeneration at C5-C6 level: Secondary | ICD-10-CM | POA: Diagnosis not present

## 2021-08-17 DIAGNOSIS — M9903 Segmental and somatic dysfunction of lumbar region: Secondary | ICD-10-CM | POA: Diagnosis not present

## 2021-08-17 DIAGNOSIS — M791 Myalgia, unspecified site: Secondary | ICD-10-CM | POA: Diagnosis not present

## 2021-08-17 DIAGNOSIS — M542 Cervicalgia: Secondary | ICD-10-CM | POA: Diagnosis not present

## 2021-08-17 MED ORDER — LOSARTAN POTASSIUM-HCTZ 100-25 MG PO TABS: 1.0000 | ORAL_TABLET | Freq: Every day | ORAL | 0 refills | Status: DC

## 2021-08-17 NOTE — Telephone Encounter (Signed)
-----   Message from Burnis Medin, MD sent at 08/17/2021 10:46 AM EDT ----- Regarding: RE: Combo BP medication Ok to do the combination  med ( may have been a supply shortage  in past)instead of separate rx . WP ----- Message ----- From: Nilda Riggs, CMA Sent: 08/17/2021   8:12 AM EDT To: Burnis Medin, MD Subject: FW: Combo BP medication                        Is this ok to refill pt has not been prescribed this since 09/10/2019? ----- Message ----- From: Viona Gilmore, RPH Sent: 08/17/2021   7:30 AM EDT To: Grafton Warzecha L Srihith Aquilino, CMA Subject: Combo BP medication                            Hi,  Is there any chance you can send in a prescription for the combination pill of the losartan-HCTZ so she doesn't have to take quite as many medications? She requested this when we spoke. I know it looks like Dr. Elease Hashimoto sent it in the last time but I think that may have been in Dr. Velora Mediate absence.  Thanks, Maddie

## 2021-08-17 NOTE — Telephone Encounter (Signed)
-----   Message from Viona Gilmore, [facility] sent at 08/16/2021 11:39 PM EDT ----- Regarding: Combo BP medication Hi,  Is there any chance you can send in a prescription for the combination pill of the losartan-HCTZ so she doesn't have to take quite as many medications? She requested this when we spoke. I know it looks like Dr. Elease Hashimoto sent it in the last time but I think that may have been in Dr. Velora Mediate absence.  Thanks, Maddie

## 2021-08-17 NOTE — Telephone Encounter (Signed)
RX has been sent to the pt's pharmacy.

## 2021-08-24 ENCOUNTER — Telehealth: Payer: Self-pay | Admitting: Pharmacist

## 2021-08-24 DIAGNOSIS — M791 Myalgia, unspecified site: Secondary | ICD-10-CM | POA: Diagnosis not present

## 2021-08-24 DIAGNOSIS — M50322 Other cervical disc degeneration at C5-C6 level: Secondary | ICD-10-CM | POA: Diagnosis not present

## 2021-08-24 DIAGNOSIS — M9903 Segmental and somatic dysfunction of lumbar region: Secondary | ICD-10-CM | POA: Diagnosis not present

## 2021-08-24 DIAGNOSIS — M5136 Other intervertebral disc degeneration, lumbar region: Secondary | ICD-10-CM | POA: Diagnosis not present

## 2021-08-24 DIAGNOSIS — M9901 Segmental and somatic dysfunction of cervical region: Secondary | ICD-10-CM | POA: Diagnosis not present

## 2021-08-24 DIAGNOSIS — M25512 Pain in left shoulder: Secondary | ICD-10-CM | POA: Diagnosis not present

## 2021-08-24 DIAGNOSIS — M25511 Pain in right shoulder: Secondary | ICD-10-CM | POA: Diagnosis not present

## 2021-08-24 DIAGNOSIS — M542 Cervicalgia: Secondary | ICD-10-CM | POA: Diagnosis not present

## 2021-08-24 DIAGNOSIS — M9902 Segmental and somatic dysfunction of thoracic region: Secondary | ICD-10-CM | POA: Diagnosis not present

## 2021-08-24 NOTE — Progress Notes (Signed)
Chronic Care Management Pharmacy Assistant   Name: Kaylee Adams  MRN: [id number] DOB: 07-03-1951    Reason for Encounter: General Assessment Call     Recent office visits:  None  Recent consult visits:  08-14-2021 Kaylee Rued, DO  (Otolaryngology) - Patient presented for follow-up of right aural fullness. No medication changes.  08-14-2021 Kaylee Adams ( Audiology) - Patient presented for Eustachian tube dysfunction. No medication changes or other notes available.  Hospital visits:  Medication Reconciliation was completed by comparing discharge summary, patient's EMR and Pharmacy list, and upon discussion with patient.   Patient visited  [facility] on 05-06-2021 due to shoulder pain. Discharge date was 05-06-2021. Patient was there for 2 hours.    New?Medications Started at [facility] Discharge:?? -started None, advised to use Tylenol/Ibuprofen as needed   Medication Changes at Hospital Discharge: -Changed None   Medications Discontinued at Hospital Discharge: -Stopped None   Medications that remain the same after Hospital Discharge:??  -All other medications will remain the same.       Patient visited  [facility] on 05-06-2021 due to neck and shoulder pain. Discharge date was 05-06-2021. Patient was there for 13 minutes. She was transferred to another facility after leaving   New?Medications Started at [facility] Discharge:?? -started None,    Medication Changes at Hospital Discharge: -Changed None   Medications Discontinued at Hospital Discharge: -Stopped None   Medications that remain the same after Hospital Discharge:??  -All other medications will remain the same.    Medications: Outpatient Encounter Medications as of 08/24/2021  Medication Sig   amLODipine (NORVASC) 10 MG tablet TAKE 1 TABLET BY MOUTH EVERY DAY   aspirin 81 MG chewable tablet Chew by mouth daily.   Cyanocobalamin (VITAMIN B-12 PO)  Take 5,000 mcg by mouth. Liquid   estradiol (ESTRACE) 0.1 MG/GM vaginal cream Place 1 g vaginally 2 (two) times a week. (Patient not taking: Reported on 08/02/2021)   gabapentin (NEURONTIN) 100 MG capsule Take 2 capsules (200 mg total) by mouth at bedtime.   hydrochlorothiazide (HYDRODIURIL) 25 MG tablet TAKE 1 TABLET BY MOUTH EVERY DAY   ibuprofen (ADVIL) 200 MG tablet Take 200 mg by mouth every 6 (six) hours as needed.   losartan (COZAAR) 100 MG tablet TAKE 1 TABLET BY MOUTH EVERY DAY   losartan-hydrochlorothiazide (HYZAAR) 100-25 MG tablet Take 1 tablet by mouth daily.   MAGNESIUM PO Take by mouth daily. (Patient not taking: Reported on 08/02/2021)   MULTIPLE VITAMIN PO Take 1 capsule by mouth daily.   rosuvastatin (CRESTOR) 10 MG tablet Take 1 tablet by mouth 3 times per week and increase to once a day after 2 weeks .   vitamin C (ASCORBIC ACID) 500 MG tablet Take 500 mg by mouth daily.   Vitamin D, Ergocalciferol, (DRISDOL) 1.25 MG (50000 UNIT) CAPS capsule TAKE 1 CAPSULE (50,000 UNITS TOTAL) BY MOUTH EVERY 7 (SEVEN) DAYS   No facility-administered encounter medications on file as of 08/24/2021.    08/31/2021 Name: Kaylee Adams MRN: [id number] DOB: Feb 22, 1951 Kaylee Adams is a 70 y.o. year old female who is a primary care patient of Panosh, Standley Brooking, MD.  Comprehensive medication review performed; Spoke to patient regarding cholesterol  Lipid Panel    Component Value Date/Time   CHOL 250 (H) 02/27/2021 1036   TRIG 89.0 02/27/2021 1036   HDL 53.20 02/27/2021 1036   LDLCALC 179 (H) 02/27/2021 1036   LDLDIRECT 153.2 06/23/2012 [id number]  10-year ASCVD risk score: The 10-year ASCVD risk score (Arnett DK, et al., 2019) is: 11.8%   Values used to calculate the score:     Age: 89 years     Sex: Female     Is Non-Hispanic African American: Yes     Diabetic: No     Tobacco smoker: No     Systolic Blood Pressure: 123456 mmHg     Is BP treated: Yes     HDL Cholesterol: 53.2 mg/dL     Total  Cholesterol: 250 mg/dL  Current antihyperlipidemic regimen:  Rosuvastatin (Crestor) 10 mg  Previous antihyperlipidemic medications tried: None ASCVD risk enhancing conditions: age >36 and HTN What recent interventions/DTPs have been made by any provider to improve Cholesterol control since last CPP Visit: Patient reports none Any recent hospitalizations or ED visits since last visit with CPP? None What diet changes have been made to improve Cholesterol?  Did not discuss What exercise is being done to improve Cholesterol?  Patient is walking in the mornings.  Adherence Review: Does the patient have >5 day gap between last estimated fill dates? No    Notes: Attempted to reach on 08-24-21 left vm Attempted to reach on 08-31-2021 left VM Attempted to reach on 09-05-21 spoke to patients daughter she reports her mom's phone has been acting up and she would have her call me back. Spoke to patient she reports everything is well she is doing just fine. Care Gaps: Zoster Vaccine- Overdue Flu Vaccine - Overdue  AWV - [name] aware to schedule per notes CCM Follow up Call- Scheduled 12-19-21  Star Rating Drugs: Losartan -HCTZ (Hyzaar) 100-25 mg - [facility] '[facility]'$  - Last filled [facility]-26-2021 [facility] Rosuvastatin (Crestor) 10 mg - Last filled 08-04-2021 90 DS at [facility] Pharmacist Assistant [phone]

## 2021-08-31 ENCOUNTER — Telehealth: Payer: Self-pay | Admitting: Internal Medicine

## 2021-08-31 DIAGNOSIS — M542 Cervicalgia: Secondary | ICD-10-CM | POA: Diagnosis not present

## 2021-08-31 DIAGNOSIS — M50322 Other cervical disc degeneration at C5-C6 level: Secondary | ICD-10-CM | POA: Diagnosis not present

## 2021-08-31 DIAGNOSIS — M9903 Segmental and somatic dysfunction of lumbar region: Secondary | ICD-10-CM | POA: Diagnosis not present

## 2021-08-31 DIAGNOSIS — M791 Myalgia, unspecified site: Secondary | ICD-10-CM | POA: Diagnosis not present

## 2021-08-31 DIAGNOSIS — M9902 Segmental and somatic dysfunction of thoracic region: Secondary | ICD-10-CM | POA: Diagnosis not present

## 2021-08-31 DIAGNOSIS — M25512 Pain in left shoulder: Secondary | ICD-10-CM | POA: Diagnosis not present

## 2021-08-31 DIAGNOSIS — M9901 Segmental and somatic dysfunction of cervical region: Secondary | ICD-10-CM | POA: Diagnosis not present

## 2021-08-31 DIAGNOSIS — M25511 Pain in right shoulder: Secondary | ICD-10-CM | POA: Diagnosis not present

## 2021-08-31 DIAGNOSIS — M5136 Other intervertebral disc degeneration, lumbar region: Secondary | ICD-10-CM | POA: Diagnosis not present

## 2021-08-31 NOTE — Telephone Encounter (Signed)
Left message for patient to call back and schedule Medicare Annual Wellness Visit (AWV) either virtually or in office. Left  my Herbie Drape number [phone]   Last AWV 08/02/20  please schedule at anytime with [facility] [place] 1 or 2   This should be a 45 minute visit.

## 2021-09-04 ENCOUNTER — Other Ambulatory Visit: Payer: Self-pay | Admitting: Family Medicine

## 2021-09-05 ENCOUNTER — Telehealth: Payer: Self-pay | Admitting: *Deleted

## 2021-09-05 NOTE — Telephone Encounter (Signed)
Left message for pt to call and schedule her medicare AWV

## 2021-09-07 ENCOUNTER — Other Ambulatory Visit: Payer: Self-pay | Admitting: Internal Medicine

## 2021-09-07 DIAGNOSIS — M9903 Segmental and somatic dysfunction of lumbar region: Secondary | ICD-10-CM | POA: Diagnosis not present

## 2021-09-07 DIAGNOSIS — M25512 Pain in left shoulder: Secondary | ICD-10-CM | POA: Diagnosis not present

## 2021-09-07 DIAGNOSIS — M9902 Segmental and somatic dysfunction of thoracic region: Secondary | ICD-10-CM | POA: Diagnosis not present

## 2021-09-07 DIAGNOSIS — M9901 Segmental and somatic dysfunction of cervical region: Secondary | ICD-10-CM | POA: Diagnosis not present

## 2021-09-07 DIAGNOSIS — M25511 Pain in right shoulder: Secondary | ICD-10-CM | POA: Diagnosis not present

## 2021-09-07 DIAGNOSIS — M791 Myalgia, unspecified site: Secondary | ICD-10-CM | POA: Diagnosis not present

## 2021-09-07 DIAGNOSIS — M5136 Other intervertebral disc degeneration, lumbar region: Secondary | ICD-10-CM | POA: Diagnosis not present

## 2021-09-07 DIAGNOSIS — M542 Cervicalgia: Secondary | ICD-10-CM | POA: Diagnosis not present

## 2021-09-07 DIAGNOSIS — M50322 Other cervical disc degeneration at C5-C6 level: Secondary | ICD-10-CM | POA: Diagnosis not present

## 2021-09-14 DIAGNOSIS — M542 Cervicalgia: Secondary | ICD-10-CM | POA: Diagnosis not present

## 2021-09-14 DIAGNOSIS — M25511 Pain in right shoulder: Secondary | ICD-10-CM | POA: Diagnosis not present

## 2021-09-14 DIAGNOSIS — M9902 Segmental and somatic dysfunction of thoracic region: Secondary | ICD-10-CM | POA: Diagnosis not present

## 2021-09-14 DIAGNOSIS — M9903 Segmental and somatic dysfunction of lumbar region: Secondary | ICD-10-CM | POA: Diagnosis not present

## 2021-09-14 DIAGNOSIS — M25512 Pain in left shoulder: Secondary | ICD-10-CM | POA: Diagnosis not present

## 2021-09-14 DIAGNOSIS — M791 Myalgia, unspecified site: Secondary | ICD-10-CM | POA: Diagnosis not present

## 2021-09-14 DIAGNOSIS — M5136 Other intervertebral disc degeneration, lumbar region: Secondary | ICD-10-CM | POA: Diagnosis not present

## 2021-09-14 DIAGNOSIS — M50322 Other cervical disc degeneration at C5-C6 level: Secondary | ICD-10-CM | POA: Diagnosis not present

## 2021-09-14 DIAGNOSIS — M9901 Segmental and somatic dysfunction of cervical region: Secondary | ICD-10-CM | POA: Diagnosis not present

## 2021-09-21 DIAGNOSIS — M5136 Other intervertebral disc degeneration, lumbar region: Secondary | ICD-10-CM | POA: Diagnosis not present

## 2021-09-21 DIAGNOSIS — M542 Cervicalgia: Secondary | ICD-10-CM | POA: Diagnosis not present

## 2021-09-21 DIAGNOSIS — M25512 Pain in left shoulder: Secondary | ICD-10-CM | POA: Diagnosis not present

## 2021-09-21 DIAGNOSIS — M25511 Pain in right shoulder: Secondary | ICD-10-CM | POA: Diagnosis not present

## 2021-09-21 DIAGNOSIS — M9903 Segmental and somatic dysfunction of lumbar region: Secondary | ICD-10-CM | POA: Diagnosis not present

## 2021-09-21 DIAGNOSIS — M50322 Other cervical disc degeneration at C5-C6 level: Secondary | ICD-10-CM | POA: Diagnosis not present

## 2021-09-21 DIAGNOSIS — M9901 Segmental and somatic dysfunction of cervical region: Secondary | ICD-10-CM | POA: Diagnosis not present

## 2021-09-21 DIAGNOSIS — M9902 Segmental and somatic dysfunction of thoracic region: Secondary | ICD-10-CM | POA: Diagnosis not present

## 2021-09-21 DIAGNOSIS — M791 Myalgia, unspecified site: Secondary | ICD-10-CM | POA: Diagnosis not present

## 2021-10-19 DIAGNOSIS — M791 Myalgia, unspecified site: Secondary | ICD-10-CM | POA: Diagnosis not present

## 2021-10-19 DIAGNOSIS — M5136 Other intervertebral disc degeneration, lumbar region: Secondary | ICD-10-CM | POA: Diagnosis not present

## 2021-10-19 DIAGNOSIS — M542 Cervicalgia: Secondary | ICD-10-CM | POA: Diagnosis not present

## 2021-10-19 DIAGNOSIS — M9903 Segmental and somatic dysfunction of lumbar region: Secondary | ICD-10-CM | POA: Diagnosis not present

## 2021-10-19 DIAGNOSIS — M50322 Other cervical disc degeneration at C5-C6 level: Secondary | ICD-10-CM | POA: Diagnosis not present

## 2021-10-19 DIAGNOSIS — M25512 Pain in left shoulder: Secondary | ICD-10-CM | POA: Diagnosis not present

## 2021-10-19 DIAGNOSIS — M9902 Segmental and somatic dysfunction of thoracic region: Secondary | ICD-10-CM | POA: Diagnosis not present

## 2021-10-19 DIAGNOSIS — M25511 Pain in right shoulder: Secondary | ICD-10-CM | POA: Diagnosis not present

## 2021-10-19 DIAGNOSIS — M9901 Segmental and somatic dysfunction of cervical region: Secondary | ICD-10-CM | POA: Diagnosis not present

## 2021-10-25 DIAGNOSIS — Z23 Encounter for immunization: Secondary | ICD-10-CM | POA: Diagnosis not present

## 2021-10-26 DIAGNOSIS — M5136 Other intervertebral disc degeneration, lumbar region: Secondary | ICD-10-CM | POA: Diagnosis not present

## 2021-10-26 DIAGNOSIS — M25512 Pain in left shoulder: Secondary | ICD-10-CM | POA: Diagnosis not present

## 2021-10-26 DIAGNOSIS — M50322 Other cervical disc degeneration at C5-C6 level: Secondary | ICD-10-CM | POA: Diagnosis not present

## 2021-10-26 DIAGNOSIS — M25511 Pain in right shoulder: Secondary | ICD-10-CM | POA: Diagnosis not present

## 2021-10-26 DIAGNOSIS — M9901 Segmental and somatic dysfunction of cervical region: Secondary | ICD-10-CM | POA: Diagnosis not present

## 2021-10-26 DIAGNOSIS — M542 Cervicalgia: Secondary | ICD-10-CM | POA: Diagnosis not present

## 2021-10-26 DIAGNOSIS — M9903 Segmental and somatic dysfunction of lumbar region: Secondary | ICD-10-CM | POA: Diagnosis not present

## 2021-10-26 DIAGNOSIS — M791 Myalgia, unspecified site: Secondary | ICD-10-CM | POA: Diagnosis not present

## 2021-10-26 DIAGNOSIS — M9902 Segmental and somatic dysfunction of thoracic region: Secondary | ICD-10-CM | POA: Diagnosis not present

## 2021-11-09 DIAGNOSIS — M9902 Segmental and somatic dysfunction of thoracic region: Secondary | ICD-10-CM | POA: Diagnosis not present

## 2021-11-09 DIAGNOSIS — M25511 Pain in right shoulder: Secondary | ICD-10-CM | POA: Diagnosis not present

## 2021-11-09 DIAGNOSIS — M9901 Segmental and somatic dysfunction of cervical region: Secondary | ICD-10-CM | POA: Diagnosis not present

## 2021-11-09 DIAGNOSIS — M25512 Pain in left shoulder: Secondary | ICD-10-CM | POA: Diagnosis not present

## 2021-11-09 DIAGNOSIS — M50322 Other cervical disc degeneration at C5-C6 level: Secondary | ICD-10-CM | POA: Diagnosis not present

## 2021-11-09 DIAGNOSIS — M791 Myalgia, unspecified site: Secondary | ICD-10-CM | POA: Diagnosis not present

## 2021-11-09 DIAGNOSIS — M542 Cervicalgia: Secondary | ICD-10-CM | POA: Diagnosis not present

## 2021-11-09 DIAGNOSIS — M5136 Other intervertebral disc degeneration, lumbar region: Secondary | ICD-10-CM | POA: Diagnosis not present

## 2021-11-09 DIAGNOSIS — M9903 Segmental and somatic dysfunction of lumbar region: Secondary | ICD-10-CM | POA: Diagnosis not present

## 2021-11-13 DIAGNOSIS — M25512 Pain in left shoulder: Secondary | ICD-10-CM | POA: Diagnosis not present

## 2021-11-13 DIAGNOSIS — M50322 Other cervical disc degeneration at C5-C6 level: Secondary | ICD-10-CM | POA: Diagnosis not present

## 2021-11-13 DIAGNOSIS — M5136 Other intervertebral disc degeneration, lumbar region: Secondary | ICD-10-CM | POA: Diagnosis not present

## 2021-11-13 DIAGNOSIS — M9902 Segmental and somatic dysfunction of thoracic region: Secondary | ICD-10-CM | POA: Diagnosis not present

## 2021-11-13 DIAGNOSIS — M9903 Segmental and somatic dysfunction of lumbar region: Secondary | ICD-10-CM | POA: Diagnosis not present

## 2021-11-13 DIAGNOSIS — M25511 Pain in right shoulder: Secondary | ICD-10-CM | POA: Diagnosis not present

## 2021-11-13 DIAGNOSIS — M542 Cervicalgia: Secondary | ICD-10-CM | POA: Diagnosis not present

## 2021-11-13 DIAGNOSIS — M791 Myalgia, unspecified site: Secondary | ICD-10-CM | POA: Diagnosis not present

## 2021-11-13 DIAGNOSIS — M9901 Segmental and somatic dysfunction of cervical region: Secondary | ICD-10-CM | POA: Diagnosis not present

## 2021-11-22 DIAGNOSIS — M50322 Other cervical disc degeneration at C5-C6 level: Secondary | ICD-10-CM | POA: Diagnosis not present

## 2021-11-22 DIAGNOSIS — M9903 Segmental and somatic dysfunction of lumbar region: Secondary | ICD-10-CM | POA: Diagnosis not present

## 2021-11-22 DIAGNOSIS — M25511 Pain in right shoulder: Secondary | ICD-10-CM | POA: Diagnosis not present

## 2021-11-22 DIAGNOSIS — M9901 Segmental and somatic dysfunction of cervical region: Secondary | ICD-10-CM | POA: Diagnosis not present

## 2021-11-22 DIAGNOSIS — M25512 Pain in left shoulder: Secondary | ICD-10-CM | POA: Diagnosis not present

## 2021-11-22 DIAGNOSIS — M542 Cervicalgia: Secondary | ICD-10-CM | POA: Diagnosis not present

## 2021-11-22 DIAGNOSIS — M791 Myalgia, unspecified site: Secondary | ICD-10-CM | POA: Diagnosis not present

## 2021-11-22 DIAGNOSIS — M9902 Segmental and somatic dysfunction of thoracic region: Secondary | ICD-10-CM | POA: Diagnosis not present

## 2021-11-22 DIAGNOSIS — M5136 Other intervertebral disc degeneration, lumbar region: Secondary | ICD-10-CM | POA: Diagnosis not present

## 2021-11-30 DIAGNOSIS — M9902 Segmental and somatic dysfunction of thoracic region: Secondary | ICD-10-CM | POA: Diagnosis not present

## 2021-11-30 DIAGNOSIS — M25511 Pain in right shoulder: Secondary | ICD-10-CM | POA: Diagnosis not present

## 2021-11-30 DIAGNOSIS — M5136 Other intervertebral disc degeneration, lumbar region: Secondary | ICD-10-CM | POA: Diagnosis not present

## 2021-11-30 DIAGNOSIS — M25512 Pain in left shoulder: Secondary | ICD-10-CM | POA: Diagnosis not present

## 2021-11-30 DIAGNOSIS — M542 Cervicalgia: Secondary | ICD-10-CM | POA: Diagnosis not present

## 2021-11-30 DIAGNOSIS — M791 Myalgia, unspecified site: Secondary | ICD-10-CM | POA: Diagnosis not present

## 2021-11-30 DIAGNOSIS — M9901 Segmental and somatic dysfunction of cervical region: Secondary | ICD-10-CM | POA: Diagnosis not present

## 2021-11-30 DIAGNOSIS — M50322 Other cervical disc degeneration at C5-C6 level: Secondary | ICD-10-CM | POA: Diagnosis not present

## 2021-11-30 DIAGNOSIS — M9903 Segmental and somatic dysfunction of lumbar region: Secondary | ICD-10-CM | POA: Diagnosis not present

## 2021-12-04 ENCOUNTER — Telehealth: Payer: Self-pay | Admitting: Internal Medicine

## 2021-12-04 NOTE — Telephone Encounter (Signed)
Left message for patient to call back and schedule Medicare Annual Wellness Visit (AWV) either virtually or in office. Left  my Herbie Drape number [phone]   Last AWV ;08/02/20  please schedule at anytime with [facility] Nurse Health Advisor 1 or 2   This should be a 45 minute visit.

## 2021-12-19 ENCOUNTER — Telehealth: Payer: Medicare Other

## 2021-12-19 ENCOUNTER — Other Ambulatory Visit: Payer: Self-pay | Admitting: Internal Medicine

## 2021-12-28 DIAGNOSIS — M9901 Segmental and somatic dysfunction of cervical region: Secondary | ICD-10-CM | POA: Diagnosis not present

## 2021-12-28 DIAGNOSIS — M25511 Pain in right shoulder: Secondary | ICD-10-CM | POA: Diagnosis not present

## 2021-12-28 DIAGNOSIS — M5136 Other intervertebral disc degeneration, lumbar region: Secondary | ICD-10-CM | POA: Diagnosis not present

## 2021-12-28 DIAGNOSIS — M25512 Pain in left shoulder: Secondary | ICD-10-CM | POA: Diagnosis not present

## 2021-12-28 DIAGNOSIS — M9902 Segmental and somatic dysfunction of thoracic region: Secondary | ICD-10-CM | POA: Diagnosis not present

## 2021-12-28 DIAGNOSIS — M791 Myalgia, unspecified site: Secondary | ICD-10-CM | POA: Diagnosis not present

## 2021-12-28 DIAGNOSIS — M50322 Other cervical disc degeneration at C5-C6 level: Secondary | ICD-10-CM | POA: Diagnosis not present

## 2021-12-28 DIAGNOSIS — M9903 Segmental and somatic dysfunction of lumbar region: Secondary | ICD-10-CM | POA: Diagnosis not present

## 2021-12-28 DIAGNOSIS — M542 Cervicalgia: Secondary | ICD-10-CM | POA: Diagnosis not present

## 2022-01-01 DIAGNOSIS — M9902 Segmental and somatic dysfunction of thoracic region: Secondary | ICD-10-CM | POA: Diagnosis not present

## 2022-01-01 DIAGNOSIS — M25512 Pain in left shoulder: Secondary | ICD-10-CM | POA: Diagnosis not present

## 2022-01-01 DIAGNOSIS — M50322 Other cervical disc degeneration at C5-C6 level: Secondary | ICD-10-CM | POA: Diagnosis not present

## 2022-01-01 DIAGNOSIS — M9901 Segmental and somatic dysfunction of cervical region: Secondary | ICD-10-CM | POA: Diagnosis not present

## 2022-01-01 DIAGNOSIS — M25511 Pain in right shoulder: Secondary | ICD-10-CM | POA: Diagnosis not present

## 2022-01-01 DIAGNOSIS — M791 Myalgia, unspecified site: Secondary | ICD-10-CM | POA: Diagnosis not present

## 2022-01-01 DIAGNOSIS — M9903 Segmental and somatic dysfunction of lumbar region: Secondary | ICD-10-CM | POA: Diagnosis not present

## 2022-01-01 DIAGNOSIS — M542 Cervicalgia: Secondary | ICD-10-CM | POA: Diagnosis not present

## 2022-01-01 DIAGNOSIS — M5136 Other intervertebral disc degeneration, lumbar region: Secondary | ICD-10-CM | POA: Diagnosis not present

## 2022-01-11 DIAGNOSIS — M25512 Pain in left shoulder: Secondary | ICD-10-CM | POA: Diagnosis not present

## 2022-01-11 DIAGNOSIS — M9901 Segmental and somatic dysfunction of cervical region: Secondary | ICD-10-CM | POA: Diagnosis not present

## 2022-01-11 DIAGNOSIS — M50322 Other cervical disc degeneration at C5-C6 level: Secondary | ICD-10-CM | POA: Diagnosis not present

## 2022-01-11 DIAGNOSIS — M542 Cervicalgia: Secondary | ICD-10-CM | POA: Diagnosis not present

## 2022-01-11 DIAGNOSIS — M9903 Segmental and somatic dysfunction of lumbar region: Secondary | ICD-10-CM | POA: Diagnosis not present

## 2022-01-11 DIAGNOSIS — M9902 Segmental and somatic dysfunction of thoracic region: Secondary | ICD-10-CM | POA: Diagnosis not present

## 2022-01-11 DIAGNOSIS — M5136 Other intervertebral disc degeneration, lumbar region: Secondary | ICD-10-CM | POA: Diagnosis not present

## 2022-01-11 DIAGNOSIS — M25511 Pain in right shoulder: Secondary | ICD-10-CM | POA: Diagnosis not present

## 2022-01-11 DIAGNOSIS — M791 Myalgia, unspecified site: Secondary | ICD-10-CM | POA: Diagnosis not present

## 2022-01-22 NOTE — Progress Notes (Signed)
This encounter was created in error - please disregard.

## 2022-01-26 DIAGNOSIS — M50322 Other cervical disc degeneration at C5-C6 level: Secondary | ICD-10-CM | POA: Diagnosis not present

## 2022-01-26 DIAGNOSIS — M542 Cervicalgia: Secondary | ICD-10-CM | POA: Diagnosis not present

## 2022-01-26 DIAGNOSIS — M5136 Other intervertebral disc degeneration, lumbar region: Secondary | ICD-10-CM | POA: Diagnosis not present

## 2022-01-26 DIAGNOSIS — M9901 Segmental and somatic dysfunction of cervical region: Secondary | ICD-10-CM | POA: Diagnosis not present

## 2022-01-26 DIAGNOSIS — M25512 Pain in left shoulder: Secondary | ICD-10-CM | POA: Diagnosis not present

## 2022-01-26 DIAGNOSIS — M791 Myalgia, unspecified site: Secondary | ICD-10-CM | POA: Diagnosis not present

## 2022-01-26 DIAGNOSIS — M9903 Segmental and somatic dysfunction of lumbar region: Secondary | ICD-10-CM | POA: Diagnosis not present

## 2022-01-26 DIAGNOSIS — M9902 Segmental and somatic dysfunction of thoracic region: Secondary | ICD-10-CM | POA: Diagnosis not present

## 2022-01-26 DIAGNOSIS — M25511 Pain in right shoulder: Secondary | ICD-10-CM | POA: Diagnosis not present

## 2022-02-01 ENCOUNTER — Encounter: Payer: Self-pay | Admitting: Internal Medicine

## 2022-02-01 ENCOUNTER — Telehealth: Payer: Self-pay | Admitting: Internal Medicine

## 2022-02-01 ENCOUNTER — Telehealth (INDEPENDENT_AMBULATORY_CARE_PROVIDER_SITE_OTHER): Payer: Medicare Other | Admitting: Internal Medicine

## 2022-02-01 DIAGNOSIS — J988 Other specified respiratory disorders: Secondary | ICD-10-CM | POA: Diagnosis not present

## 2022-02-01 DIAGNOSIS — Z20822 Contact with and (suspected) exposure to covid-19: Secondary | ICD-10-CM | POA: Diagnosis not present

## 2022-02-01 DIAGNOSIS — R519 Headache, unspecified: Secondary | ICD-10-CM | POA: Diagnosis not present

## 2022-02-01 DIAGNOSIS — B9789 Other viral agents as the cause of diseases classified elsewhere: Secondary | ICD-10-CM | POA: Diagnosis not present

## 2022-02-01 DIAGNOSIS — U071 COVID-19: Secondary | ICD-10-CM | POA: Diagnosis not present

## 2022-02-01 NOTE — Telephone Encounter (Signed)
Pt call and stated she went to urgent care and they gave her RX for Lagevrio 200 mg tab and stated if dr.Panosh  need to change it she can call her back.

## 2022-02-01 NOTE — Progress Notes (Signed)
Virtual Visit via Video Note  I connected with Kaylee Adams on 02/01/22 at  9:30 AM EST by a video enabled telemedicine application and verified that I am speaking with the correct person using two identifiers. Location patient: home Location provider:home office Persons participating in the virtual visit: patient, provider  [place] national recommendations  regarding COVID 19 pandemic   video visit is advised over in office visit for this patient.  Patient aware  of the limitations of evaluation and management by telemedicine and  availability of in person appointments. and agreed to proceed.   HPI: Kaylee Adams presents for video visit because of onset yesterday of feeling bad losing her voice developing cough malaise and this morning headache and fatigue without fever chills or specific myalgia.  No shortness of breath.  Husband began having a coughing illness about 5 days ago and he tested positive for COVID 3 days ago.  She did a home COVID test last night and that was negative.  She did have COVID infection December 2021 and was treated with infusion at the time. Blood pressure has been okay far to her knowledge.    ROS: See pertinent positives and negatives per HPI.  Past Medical History:  Diagnosis Date   Allergy    occ takes otc allergy pill   CAD (coronary artery disease)    a. 11/2005 Cath: nonobs dzs, 30% LAD lesion on cath ;  b. 11/2005 Echo: NL EF;  c. 04/2012 Echo: EF 55-60%, Gr 2 DD;  d. 04/2012 Ex MV: EF 72%, ST depression in recovery with NL perfusion imaging - HTN response to exercise.   Chest pressure "fatigue" 05/08/2012   GERD (gastroesophageal reflux disease)    Hepatic cyst    on ct [place]   HLD (hyperlipidemia)    HTN (hypertension)    Midsternal chest pain    cardiology eval with non-ischemic stress test 04/2012   Upper airway cough syndrome 04/29/2015   Trial of max gerd rx 04/29/2015 >>>      Past Surgical History:  Procedure Laterality Date   ABDOMINAL  HYSTERECTOMY     fibroid tumors   BREAST BIOPSY Right    BREAST EXCISIONAL BIOPSY Right    child birth x 5     CHOLECYSTECTOMY     COLONOSCOPY  01/09/2008   SHOULDER ARTHROSCOPY WITH ROTATOR CUFF REPAIR AND SUBACROMIAL DECOMPRESSION Right 11/03/2013   Procedure: RIGHT SHOULDER ARTHROSCOPY WITH DEBRIDEMENT, ROTATOR CUFF REPAIR AND SUBACROMIAL DECOMPRESSION, DISTAL CLAVICLE RESECTION;  Surgeon: Mcarthur Rossetti, MD;  Location: [facility];  Service: Orthopedics;  Laterality: Right;   VESICOVAGINAL FISTULA CLOSURE W/ TAH     for fibroid tumors    Family History  Problem Relation Age of Onset   Breast cancer Sister    Thyroid disease Sister    Hypertension Mother    Colon cancer Brother 24       dx'd about age 81 per pt-    Prostate cancer Brother    Lung cancer Brother    Liver cancer Brother    ALS Brother    Healthy Daughter    Healthy Daughter    Healthy Son    Healthy Son    Healthy Son    Colon polyps Neg Hx    Esophageal cancer Neg Hx    Rectal cancer Neg Hx    Stomach cancer Neg Hx     Social History   Tobacco Use   Smoking status: Never   Smokeless tobacco: Never  Vaping Use   Vaping Use: Never used  Substance Use Topics   Alcohol use: No   Drug use: No      Current Outpatient Medications:    amLODipine (NORVASC) 10 MG tablet, TAKE 1 TABLET BY MOUTH EVERY DAY, Disp: 90 tablet, Rfl: 1   aspirin 81 MG chewable tablet, Chew by mouth daily., Disp: , Rfl:    Cyanocobalamin (VITAMIN B-12 PO), Take 5,000 mcg by mouth. Liquid, Disp: , Rfl:    estradiol (ESTRACE) 0.1 MG/GM vaginal cream, Place 1 g vaginally 2 (two) times a week., Disp: , Rfl:    gabapentin (NEURONTIN) 100 MG capsule, TAKE 2 CAPSULES (200 MG TOTAL) BY MOUTH AT BEDTIME., Disp: 180 capsule, Rfl: 3   hydrochlorothiazide (HYDRODIURIL) 25 MG tablet, TAKE 1 TABLET BY MOUTH EVERY DAY, Disp: 90 tablet, Rfl: 3   ibuprofen (ADVIL) 200 MG tablet, Take 200 mg by mouth every 6 (six) hours as needed., Disp: ,  Rfl:    losartan (COZAAR) 100 MG tablet, TAKE 1 TABLET BY MOUTH EVERY DAY, Disp: 90 tablet, Rfl: 3   losartan-hydrochlorothiazide (HYZAAR) 100-25 MG tablet, TAKE 1 TABLET BY MOUTH EVERY DAY, Disp: 90 tablet, Rfl: 0   MAGNESIUM PO, Take by mouth daily., Disp: , Rfl:    MULTIPLE VITAMIN PO, Take 1 capsule by mouth daily., Disp: , Rfl:    rosuvastatin (CRESTOR) 10 MG tablet, Take 1 tablet by mouth 3 times per week and increase to once a day after 2 weeks ., Disp: 90 tablet, Rfl: 1   vitamin C (ASCORBIC ACID) 500 MG tablet, Take 500 mg by mouth daily., Disp: , Rfl:    Vitamin D, Ergocalciferol, (DRISDOL) 1.25 MG (50000 UNIT) CAPS capsule, TAKE 1 CAPSULE (50,000 UNITS TOTAL) BY MOUTH EVERY 7 (SEVEN) DAYS, Disp: 12 capsule, Rfl: 0  EXAM: BP Readings from Last 3 Encounters:  06/05/21 132/66  05/24/21 120/64  05/06/21 129/73    VITALS per patient if applicable:  GENERAL: alert, oriented, appears well and in no acute distress  non toxic  hoarse  ocass cough  no stridor   HEENT: atraumatic, conjunttiva clear, no obvious abnormalities on inspection of external nose and ears no swelling  nl speech NECK: normal movements of the head and neck LUNGS: on inspection no signs of respiratory distress, breathing rate appears normal, no obvious gross SOB, gasping or wheezing CV: no obvious cyanosis PSYCH/NEURO: pleasant and cooperative, no obvious depression or anxiety, speech and thought processing grossly intact Lab Results  Component Value Date   WBC 4.9 05/25/2021   HGB 13.9 05/25/2021   HCT 41.4 05/25/2021   PLT 329.0 05/25/2021   GLUCOSE 82 05/25/2021   CHOL 250 (H) 02/27/2021   TRIG 89.0 02/27/2021   HDL 53.20 02/27/2021   LDLDIRECT 153.2 06/23/2012   LDLCALC 179 (H) 02/27/2021   ALT 13 02/27/2021   AST 15 02/27/2021   NA 143 05/25/2021   K 4.1 05/25/2021   CL 104 05/25/2021   CREATININE 0.82 05/25/2021   BUN 20 05/25/2021   CO2 25 05/25/2021   TSH 1.14 05/25/2021   INR 0.9 11/29/2008    HGBA1C 5.7 02/12/2008    ASSESSMENT AND PLAN:  Discussed the following assessment and plan:    ICD-10-CM   1. Viral respiratory infection  J98.8    B97.89     2. Close exposure to COVID-19 virus  Z20.822    spouse dx 3 days ago      Based on exposure context , highly suspect COVID  infection. She will get another COVID test PCR preferred if rapid negative Let us know results and would advise Paxlovid antiviral discussed with patient she is a candidate for this medicine should be given within the first 5 days of symptoms onset. Last gfr 73 [facility] [place]. Otherwise supportive symptomatic care with follow-up with alarm symptoms reviewed. Counseled.   Expectant management and discussion of plan and treatment with opportunity to ask questions and all were answered. The patient agreed with the plan and demonstrated an understanding of the instructions.   Advised to call back or seek an in-person evaluation if worsening  or having  further concerns  in interim. Return for depending on results and how doing .    Shanon Ace, MD

## 2022-02-01 NOTE — Telephone Encounter (Signed)
Well her choice about which medicine  . Marland Kitchen Her last renal function was normal .  Hope she feels better soon

## 2022-02-01 NOTE — Telephone Encounter (Signed)
I spoke with the pt and she reported that a Covid test was performed at the ED with positive results. I inquired on why the Lagevrio 200 mg was prescribed by MD at ED but pt stated that she did not know. Pt informed me that MD did notify her of two options for treatment of Covid and stated that one of these medication can affect kidney function. Pt is to take Lagevrio 200 mg 4 time daily for Covid treatment.

## 2022-02-01 NOTE — Telephone Encounter (Signed)
Pt informed of the message below.

## 2022-02-01 NOTE — Telephone Encounter (Signed)
Dose she have a positive  covid test?  Lagevrio 200 mg tab  is not as potent a medication as the Paxlovid  Ask her  why they didn't give her paxlovid ?

## 2022-02-05 ENCOUNTER — Telehealth: Payer: Self-pay | Admitting: Pharmacist

## 2022-02-05 NOTE — Chronic Care Management (AMB) (Signed)
Chronic Care Management Pharmacy Assistant   Name: Kaylee Adams  MRN: [id number] DOB: 05/18/1951  Reason for Encounter: Disease State   Conditions to be addressed/monitored: HTN  Recent office visits:  02/01/22 Panosh, Standley Brooking, MD - Patient presented via telephone visit for Viral respiratory infection and other concerns. No medication changes.  Recent consult visits:  None  Hospital visits:  None in previous 6 months  Medications: Outpatient Encounter Medications as of 02/05/2022  Medication Sig   amLODipine (NORVASC) 10 MG tablet TAKE 1 TABLET BY MOUTH EVERY DAY   aspirin 81 MG chewable tablet Chew by mouth daily.   Cyanocobalamin (VITAMIN B-12 PO) Take 5,000 mcg by mouth. Liquid   estradiol (ESTRACE) 0.1 MG/GM vaginal cream Place 1 g vaginally 2 (two) times a week.   gabapentin (NEURONTIN) 100 MG capsule TAKE 2 CAPSULES (200 MG TOTAL) BY MOUTH AT BEDTIME.   hydrochlorothiazide (HYDRODIURIL) 25 MG tablet TAKE 1 TABLET BY MOUTH EVERY DAY   ibuprofen (ADVIL) 200 MG tablet Take 200 mg by mouth every 6 (six) hours as needed.   losartan (COZAAR) 100 MG tablet TAKE 1 TABLET BY MOUTH EVERY DAY   losartan-hydrochlorothiazide (HYZAAR) 100-25 MG tablet TAKE 1 TABLET BY MOUTH EVERY DAY   MAGNESIUM PO Take by mouth daily.   MULTIPLE VITAMIN PO Take 1 capsule by mouth daily.   rosuvastatin (CRESTOR) 10 MG tablet Take 1 tablet by mouth 3 times per week and increase to once a day after 2 weeks .   vitamin C (ASCORBIC ACID) 500 MG tablet Take 500 mg by mouth daily.   Vitamin D, Ergocalciferol, (DRISDOL) 1.25 MG (50000 UNIT) CAPS capsule TAKE 1 CAPSULE (50,000 UNITS TOTAL) BY MOUTH EVERY 7 (SEVEN) DAYS   No facility-administered encounter medications on file as of 02/05/2022.  Reviewed chart prior to disease state call. Spoke with patient regarding BP  Recent Office Vitals: BP Readings from Last 3 Encounters:  06/05/21 132/66  05/24/21 120/64  05/06/21 129/73   Pulse Readings from Last  3 Encounters:  06/05/21 67  05/24/21 74  05/06/21 81    Wt Readings from Last 3 Encounters:  06/05/21 163 lb (73.9 kg)  05/24/21 162 lb 6.4 oz (73.7 kg)  05/01/21 162 lb 12.8 oz (73.8 kg)     Kidney Function Lab Results  Component Value Date/Time   CREATININE 0.82 05/25/2021 07:57 AM   CREATININE 0.75 02/27/2021 10:36 AM   CREATININE 0.69 07/29/2020 09:15 AM   CREATININE 0.77 12/10/2013 09:13 AM   GFR 73.01 05/25/2021 07:57 AM   GFRNONAA 54 (L) 10/29/2013 08:41 AM   GFRAA 63 (L) 10/29/2013 08:41 AM    BMP Latest Ref Rng & Units 05/25/2021 02/27/2021 07/29/2020  Glucose 70 - 99 mg/dL 82 83 95  BUN 6 - 23 mg/dL 20 18 15   Creatinine 0.40 - 1.20 mg/dL 0.82 0.75 0.69  BUN/Creat Ratio 6 - 22 (calc) - - NOT APPLICABLE  Sodium 503 - 145 mEq/L 143 141 142  Potassium 3.5 - 5.1 mEq/L 4.1 3.6 4.0  Chloride 96 - 112 mEq/L 104 106 106  CO2 19 - 32 mEq/L 25 29 25   Calcium 8.4 - 10.5 mg/dL 10.9(H) 10.2 10.7(H)    Current antihypertensive regimen:  Amlodipine 10 mg 1 tablet daily Hydrochlorothiazide 25 mg 1 tablet daily Losartan 100 mg 1 tablet daily How often are you checking your Blood Pressure? weekly Current home BP readings: 135/75 What recent interventions/DTPs have been made by any provider to improve Blood Pressure  control since last CPP Visit: Patient reports none Any recent hospitalizations or ED visits since last visit with CPP? No What diet changes have been made to improve Blood Pressure Control?  Patient reports her meal patterns are unchanged is watching sodium wth her meals What exercise is being done to improve your Blood Pressure Control?   Patient reports she hadnt been feeling well the past few days had been taking it easy but is on the mend now  Adherence Review: Is the patient currently on ACE/ARB medication? Yes Does the patient have >5 day gap between last estimated fill dates? No  Notes:  Patient reports she has questions concerning her Annual Wellness, advised  her that the office was reaching out to her to schedule it, she reports she does not have any changes with anything so does not feel it is necessary, she reports she will give them a call to discuss.   Care Gaps: AWV- Office aware to scheduled as of 12/22 BP- 130/75 Zoster Vaccine- Overdue COVID Booster - Overdue CCM - not interested at this time  Star Rating Drugs: Losartan (Cozaar) 100 mg - Last filled 06/26/21 90 [facility] Losartan- HCTZ- 100-25 mg - Last filled 12/19/21 90 [facility] at [facility] Rosuvastatin (Cerestor) 10 mg - Last filled 11/18/21 90 DS at [facility] Pharmacist Assistant [phone]

## 2022-02-07 DIAGNOSIS — M9902 Segmental and somatic dysfunction of thoracic region: Secondary | ICD-10-CM | POA: Diagnosis not present

## 2022-02-07 DIAGNOSIS — M25512 Pain in left shoulder: Secondary | ICD-10-CM | POA: Diagnosis not present

## 2022-02-07 DIAGNOSIS — M9901 Segmental and somatic dysfunction of cervical region: Secondary | ICD-10-CM | POA: Diagnosis not present

## 2022-02-07 DIAGNOSIS — M50322 Other cervical disc degeneration at C5-C6 level: Secondary | ICD-10-CM | POA: Diagnosis not present

## 2022-02-07 DIAGNOSIS — M5136 Other intervertebral disc degeneration, lumbar region: Secondary | ICD-10-CM | POA: Diagnosis not present

## 2022-02-07 DIAGNOSIS — M542 Cervicalgia: Secondary | ICD-10-CM | POA: Diagnosis not present

## 2022-02-07 DIAGNOSIS — M791 Myalgia, unspecified site: Secondary | ICD-10-CM | POA: Diagnosis not present

## 2022-02-07 DIAGNOSIS — M25511 Pain in right shoulder: Secondary | ICD-10-CM | POA: Diagnosis not present

## 2022-02-07 DIAGNOSIS — M9903 Segmental and somatic dysfunction of lumbar region: Secondary | ICD-10-CM | POA: Diagnosis not present

## 2022-02-14 DIAGNOSIS — M5136 Other intervertebral disc degeneration, lumbar region: Secondary | ICD-10-CM | POA: Diagnosis not present

## 2022-02-14 DIAGNOSIS — M9902 Segmental and somatic dysfunction of thoracic region: Secondary | ICD-10-CM | POA: Diagnosis not present

## 2022-02-14 DIAGNOSIS — M25511 Pain in right shoulder: Secondary | ICD-10-CM | POA: Diagnosis not present

## 2022-02-14 DIAGNOSIS — M9901 Segmental and somatic dysfunction of cervical region: Secondary | ICD-10-CM | POA: Diagnosis not present

## 2022-02-14 DIAGNOSIS — M9903 Segmental and somatic dysfunction of lumbar region: Secondary | ICD-10-CM | POA: Diagnosis not present

## 2022-02-14 DIAGNOSIS — M25512 Pain in left shoulder: Secondary | ICD-10-CM | POA: Diagnosis not present

## 2022-02-14 DIAGNOSIS — M791 Myalgia, unspecified site: Secondary | ICD-10-CM | POA: Diagnosis not present

## 2022-02-14 DIAGNOSIS — M542 Cervicalgia: Secondary | ICD-10-CM | POA: Diagnosis not present

## 2022-02-14 DIAGNOSIS — M50322 Other cervical disc degeneration at C5-C6 level: Secondary | ICD-10-CM | POA: Diagnosis not present

## 2022-02-21 ENCOUNTER — Other Ambulatory Visit: Payer: Self-pay | Admitting: Internal Medicine

## 2022-02-22 DIAGNOSIS — M542 Cervicalgia: Secondary | ICD-10-CM | POA: Diagnosis not present

## 2022-02-22 DIAGNOSIS — M50322 Other cervical disc degeneration at C5-C6 level: Secondary | ICD-10-CM | POA: Diagnosis not present

## 2022-02-22 DIAGNOSIS — M9903 Segmental and somatic dysfunction of lumbar region: Secondary | ICD-10-CM | POA: Diagnosis not present

## 2022-02-22 DIAGNOSIS — M9901 Segmental and somatic dysfunction of cervical region: Secondary | ICD-10-CM | POA: Diagnosis not present

## 2022-02-22 DIAGNOSIS — M5136 Other intervertebral disc degeneration, lumbar region: Secondary | ICD-10-CM | POA: Diagnosis not present

## 2022-02-22 DIAGNOSIS — M791 Myalgia, unspecified site: Secondary | ICD-10-CM | POA: Diagnosis not present

## 2022-02-22 DIAGNOSIS — M9902 Segmental and somatic dysfunction of thoracic region: Secondary | ICD-10-CM | POA: Diagnosis not present

## 2022-02-22 DIAGNOSIS — M25511 Pain in right shoulder: Secondary | ICD-10-CM | POA: Diagnosis not present

## 2022-02-22 DIAGNOSIS — M25512 Pain in left shoulder: Secondary | ICD-10-CM | POA: Diagnosis not present

## 2022-03-01 ENCOUNTER — Other Ambulatory Visit: Payer: Self-pay | Admitting: Internal Medicine

## 2022-03-08 DIAGNOSIS — M791 Myalgia, unspecified site: Secondary | ICD-10-CM | POA: Diagnosis not present

## 2022-03-08 DIAGNOSIS — M9901 Segmental and somatic dysfunction of cervical region: Secondary | ICD-10-CM | POA: Diagnosis not present

## 2022-03-08 DIAGNOSIS — M50322 Other cervical disc degeneration at C5-C6 level: Secondary | ICD-10-CM | POA: Diagnosis not present

## 2022-03-08 DIAGNOSIS — M9902 Segmental and somatic dysfunction of thoracic region: Secondary | ICD-10-CM | POA: Diagnosis not present

## 2022-03-08 DIAGNOSIS — M9903 Segmental and somatic dysfunction of lumbar region: Secondary | ICD-10-CM | POA: Diagnosis not present

## 2022-03-08 DIAGNOSIS — M25512 Pain in left shoulder: Secondary | ICD-10-CM | POA: Diagnosis not present

## 2022-03-08 DIAGNOSIS — M542 Cervicalgia: Secondary | ICD-10-CM | POA: Diagnosis not present

## 2022-03-08 DIAGNOSIS — M25511 Pain in right shoulder: Secondary | ICD-10-CM | POA: Diagnosis not present

## 2022-03-08 DIAGNOSIS — M5136 Other intervertebral disc degeneration, lumbar region: Secondary | ICD-10-CM | POA: Diagnosis not present

## 2022-03-12 NOTE — Progress Notes (Signed)
? ?Chief Complaint  ?Patient presents with  ? Shoulder Pain  ?  Tingling and painful   ? ? ?HPI: ?Kaylee Adams 71 y.o. come in for chonric shoulder pain  and worse left shoulder . ? ?Numbness andtoingling  into hands and to hands  comes and goes.   Very pain ful SUnday . And when tries to raise arm   shoulder pain.    No triggers.  Not in neck  pain .   ?Lasted ;long time Sunday  hours .  No rx  was out of ibuprofen .  ? ?Laid to sleep and got better  ?Sx off and on for almost a year but  this episode of numbness left arm to hand lasted linger  over 30 minutes . No weakness  cp sib  ?Is under care from chiropractor? for shoulder   left ongoing hard to elevated .  ? ?Area of numnbess tingling  shoulder down to palm of hand   ?ROS: See pertinent positives and negatives per HPI. ? ?Past Medical History:  ?Diagnosis Date  ? Allergy   ? occ takes otc allergy pill  ? CAD (coronary artery disease)   ? a. 11/2005 Cath: nonobs dzs, 30% LAD lesion on cath ;  b. 11/2005 Echo: NL EF;  c. 04/2012 Echo: EF 55-60%, Gr 2 DD;  d. 04/2012 Ex MV: EF 72%, ST depression in recovery with NL perfusion imaging - HTN response to exercise.  ? Chest pressure "fatigue" 05/08/2012  ? GERD (gastroesophageal reflux disease)   ? Hepatic cyst   ? on ct [place]  ? HLD (hyperlipidemia)   ? HTN (hypertension)   ? Midsternal chest pain   ? cardiology eval with non-ischemic stress test 04/2012  ? Upper airway cough syndrome 04/29/2015  ? Trial of max gerd rx 04/29/2015 >>>    ? ? ?Family History  ?Problem Relation Age of Onset  ? Breast cancer Sister   ? Thyroid disease Sister   ? Hypertension Mother   ? Colon cancer Brother 72  ?     dx'd about age 91 per pt-   ? Prostate cancer Brother   ? Lung cancer Brother   ? Liver cancer Brother   ? ALS Brother   ? Healthy Daughter   ? Healthy Daughter   ? Healthy Son   ? Healthy Son   ? Healthy Son   ? Colon polyps Neg Hx   ? Esophageal cancer Neg Hx   ? Rectal cancer Neg Hx   ? Stomach cancer Neg Hx   ? ? ?Social History   ? ?Socioeconomic History  ? Marital status: Married  ?  Spouse name: Not on file  ? Number of children: 5  ? Years of education: Not on file  ? Highest education level: Not on file  ?Occupational History  ?  Employer: [employer]  ?Tobacco Use  ? Smoking status: Never  ? Smokeless tobacco: Never  ?Vaping Use  ? Vaping Use: Never used  ?Substance and Sexual Activity  ? Alcohol use: No  ? Drug use: No  ? Sexual activity: Not on file  ?Other Topics Concern  ? Not on file  ?Social History Narrative  ? Married, full time Network engineer. 3 pets   ? [place]  ? 6-8 hours sleep   ? Right Handed  ? Lives in a two story home.   ? Drinks 1 cup of Coffee  ? ?Social Determinants of Health  ? ?Financial Resource Strain:  Low Risk   ? Difficulty of Paying Living Expenses: Not hard at all  ?Food Insecurity: Not on file  ?Transportation Needs: No Transportation Needs  ? Lack of Transportation (Medical): No  ? Lack of Transportation (Non-Medical): No  ?Physical Activity: Not on file  ?Stress: Not on file  ?Social Connections: Not on file  ? ? ?Outpatient Medications Prior to Visit  ?Medication Sig Dispense Refill  ? amLODipine (NORVASC) 10 MG tablet TAKE 1 TABLET BY MOUTH EVERY DAY 90 tablet 1  ? aspirin 81 MG chewable tablet Chew by mouth daily.    ? Cyanocobalamin (VITAMIN B-12 PO) Take 5,000 mcg by mouth. Liquid    ? estradiol (ESTRACE) 0.1 MG/GM vaginal cream Place 1 g vaginally 2 (two) times a week.    ? gabapentin (NEURONTIN) 100 MG capsule TAKE 2 CAPSULES (200 MG TOTAL) BY MOUTH AT BEDTIME. 180 capsule 3  ? hydrochlorothiazide (HYDRODIURIL) 25 MG tablet TAKE 1 TABLET BY MOUTH EVERY DAY 90 tablet 3  ? ibuprofen (ADVIL) 200 MG tablet Take 200 mg by mouth every 6 (six) hours as needed.    ? losartan-hydrochlorothiazide (HYZAAR) 100-25 MG tablet TAKE 1 TABLET BY MOUTH EVERY DAY 90 tablet 0  ? MAGNESIUM PO Take by mouth daily.    ? MULTIPLE VITAMIN PO Take 1 capsule by mouth daily.    ? rosuvastatin (CRESTOR) 10 MG tablet TAKE  1 TABLET BY MOUTH 3 TIMES PER WEEK AND INCREASE TO ONCE A DAY AFTER 2 WEEKS . 90 tablet 1  ? vitamin C (ASCORBIC ACID) 500 MG tablet Take 500 mg by mouth daily.    ? Vitamin D, Ergocalciferol, (DRISDOL) 1.25 MG (50000 UNIT) CAPS capsule TAKE 1 CAPSULE (50,000 UNITS TOTAL) BY MOUTH EVERY 7 (SEVEN) DAYS 12 capsule 0  ? losartan (COZAAR) 100 MG tablet TAKE 1 TABLET BY MOUTH EVERY DAY 90 tablet 3  ? ?No facility-administered medications prior to visit.  ? ? ? ?EXAM: ? ?BP 133/66 (BP Location: Left Arm, Patient Position: Sitting, Cuff Size: Normal)   Pulse 87   Temp 98.7 ?F (37.1 ?C) (Oral)   Ht '5\' 1"'$  (1.549 m)   Wt 159 lb (72.1 kg)   SpO2 99%   BMI 30.04 kg/m?  ? ?Body mass index is 30.04 kg/m?. ? ?GENERAL: vitals reviewed and listed above, alert, oriented, appears well hydrated and in no acute distress ?HEENT: atraumatic, conjunctiva  clear, no obvious abnormalities on inspection of external nose and ears OP : masked  ?NECK: no obvious masses on inspection palpation no masses noted no midline tenderness ?LUNGS: clear to auscultation bilaterally, no wheezes, rales or rhonchi, good air movement ?CV: HRRR, no clubbing cyanosis or  peripheral edema nl cap refill  ?MS: moves all extremities without noticeable focal  abnormality left upper extremity pain on elevation limitation left shoulder.  No acute swelling ?Neuro seems intact upper extremities grip strength adequate right hand predominant interosseous without wasting perfusion normal ?PSYCH: pleasant and cooperative, no obvious depression or anxiety ? ?BP Readings from Last 3 Encounters:  ?03/13/22 133/66  ?06/05/21 132/66  ?05/24/21 120/64  ?Skeleton: Multilevel degenerative changes of the cervical spine, ?greatest at C4-C5 and C5-C6. ?  ?Upper chest: No apical lung mass. ?  ?IMPRESSION: ?No neck mass or adenopathy. ?  ?Electronically Signed: ?By: Macy Mis M.D. ?On: 06/04/2021 13:27 ? ?ASSESSMENT AND PLAN: ? ?Discussed the following assessment and  plan: ? ?Left arm numbness with pain - Plan: Ambulatory referral to Sports Medicine ? ?Chronic left shoulder pain -  Plan: Ambulatory referral to Sports Medicine ?Discussed possible etiology sounds more like a neuritis and not a cardiovascular or neurologic event. ?She has significant limitation in pain with her shoulder may have impingement.  States that she has been getting chiropractic care ?Agrees that might be helpful to reach out to Dr. Tamala Julian and I will do a rereferral and also his opinion on the left arm numbness ?If becomes persistent progressive consider getting a dedicated MRI of the C-spine as we know she has some degenerative disease. ?And/or neurologic evaluation. ?She is not having the symptoms today ?-Patient advised to return or notify health care team  if  new concerns arise. ?Record review including imaging of neck from last year ?Patient Instructions  ?Good to see you today  exam is ok  ? ?Consider getting more help for shoulder pain  ? ?May be a pinched nerve in neck  area  ? ?Will reach out to Dr Tamala Julian  and referral .     ?Do not think this is cardiovascular. ? ?If  persistent or progressive consider getting  neurology evaluation  or more imaging of neck .  ? ?Standley Brooking. Chirstopher Iovino M.D. ?

## 2022-03-13 ENCOUNTER — Ambulatory Visit (INDEPENDENT_AMBULATORY_CARE_PROVIDER_SITE_OTHER): Payer: Medicare Other | Admitting: Internal Medicine

## 2022-03-13 ENCOUNTER — Encounter: Payer: Self-pay | Admitting: Internal Medicine

## 2022-03-13 VITALS — BP 133/66 | HR 87 | Temp 98.7°F | Ht 61.0 in | Wt 159.0 lb

## 2022-03-13 DIAGNOSIS — R200 Anesthesia of skin: Secondary | ICD-10-CM

## 2022-03-13 DIAGNOSIS — M25512 Pain in left shoulder: Secondary | ICD-10-CM

## 2022-03-13 DIAGNOSIS — G8929 Other chronic pain: Secondary | ICD-10-CM

## 2022-03-13 NOTE — Patient Instructions (Addendum)
Good to see you today  exam is ok  ? ?Consider getting more help for shoulder pain  ? ?May be a pinched nerve in neck  area  ? ?Will reach out to Dr Tamala Julian  and referral .     ?Do not think this is cardiovascular. ? ?If  persistent or progressive consider getting  neurology evaluation  or more imaging of neck .  ? ? ?

## 2022-03-14 NOTE — Progress Notes (Signed)
?Charlann Boxer D.O. ?[facility] ?[place] ?Phone: [phone] ?Subjective:   ?I, [name], am serving as a Education administrator for Dr. Hulan Saas. ?This visit occurred during the SARS-CoV-2 public health emergency.  Safety protocols were in place, including screening questions prior to the visit, additional usage of staff PPE, and extensive cleaning of exam room while observing appropriate contact time as indicated for disinfecting solutions.  ? ?I'm seeing this patient by the request  of:  Panosh, Standley Brooking, MD ? ?CC: left shoulder pain  ? ?MBT:[id number]  ?Kaylee Adams is a 71 y.o. female coming in with complaint of L shoulder pain. Symptoms for past year but recently pain has increased especially with flexion. Does describe numbness and tingling in arm/hand. Last seen in March 2022 for lumbar DDD. Patient states numbness and tingling and pain going down into hand. Has been happening for a while, but now happening more often. Pain can wake her up at night. Pain is sporadic.   ? ? ?  ? ?Past Medical History:  ?Diagnosis Date  ? Allergy   ? occ takes otc allergy pill  ? CAD (coronary artery disease)   ? a. 11/2005 Cath: nonobs dzs, 30% LAD lesion on cath ;  b. 11/2005 Echo: NL EF;  c. 04/2012 Echo: EF 55-60%, Gr 2 DD;  d. 04/2012 Ex MV: EF 72%, ST depression in recovery with NL perfusion imaging - HTN response to exercise.  ? Chest pressure "fatigue" 05/08/2012  ? GERD (gastroesophageal reflux disease)   ? Hepatic cyst   ? on ct [place]  ? HLD (hyperlipidemia)   ? HTN (hypertension)   ? Midsternal chest pain   ? cardiology eval with non-ischemic stress test 04/2012  ? Upper airway cough syndrome 04/29/2015  ? Trial of max gerd rx 04/29/2015 >>>    ? ?Past Surgical History:  ?Procedure Laterality Date  ? ABDOMINAL HYSTERECTOMY    ? fibroid tumors  ? BREAST BIOPSY Right   ? BREAST EXCISIONAL BIOPSY Right   ? child birth x 5    ? CHOLECYSTECTOMY    ? COLONOSCOPY  01/09/2008  ? SHOULDER ARTHROSCOPY  WITH ROTATOR CUFF REPAIR AND SUBACROMIAL DECOMPRESSION Right 11/03/2013  ? Procedure: RIGHT SHOULDER ARTHROSCOPY WITH DEBRIDEMENT, ROTATOR CUFF REPAIR AND SUBACROMIAL DECOMPRESSION, DISTAL CLAVICLE RESECTION;  Surgeon: Mcarthur Rossetti, MD;  Location: [facility];  Service: Orthopedics;  Laterality: Right;  ? VESICOVAGINAL FISTULA CLOSURE W/ TAH    ? for fibroid tumors  ? ?Social History  ? ?Socioeconomic History  ? Marital status: Married  ?  Spouse name: Not on file  ? Number of children: 5  ? Years of education: Not on file  ? Highest education level: Not on file  ?Occupational History  ?  Employer: [employer]  ?Tobacco Use  ? Smoking status: Never  ? Smokeless tobacco: Never  ?Vaping Use  ? Vaping Use: Never used  ?Substance and Sexual Activity  ? Alcohol use: No  ? Drug use: No  ? Sexual activity: Not on file  ?Other Topics Concern  ? Not on file  ?Social History Narrative  ? Married, full time Network engineer. 3 pets   ? [place]  ? 6-8 hours sleep   ? Right Handed  ? Lives in a two story home.   ? Drinks 1 cup of Coffee  ? ?Social Determinants of Health  ? ?Financial Resource Strain: Low Risk   ? Difficulty of Paying Living Expenses: Not hard  at all  ?Food Insecurity: Not on file  ?Transportation Needs: No Transportation Needs  ? Lack of Transportation (Medical): No  ? Lack of Transportation (Non-Medical): No  ?Physical Activity: Not on file  ?Stress: Not on file  ?Social Connections: Not on file  ? ?Allergies  ?Allergen Reactions  ? Lisinopril   ?  REACTION: cough  ? Penicillins Nausea Only  ? ?Family History  ?Problem Relation Age of Onset  ? Breast cancer Sister   ? Thyroid disease Sister   ? Hypertension Mother   ? Colon cancer Brother 61  ?     dx'd about age 40 per pt-   ? Prostate cancer Brother   ? Lung cancer Brother   ? Liver cancer Brother   ? ALS Brother   ? Healthy Daughter   ? Healthy Daughter   ? Healthy Son   ? Healthy Son   ? Healthy Son   ? Colon polyps Neg Hx   ? Esophageal cancer Neg  Hx   ? Rectal cancer Neg Hx   ? Stomach cancer Neg Hx   ? ? ? ?Current Outpatient Medications (Cardiovascular):  ?  amLODipine (NORVASC) 10 MG tablet, TAKE 1 TABLET BY MOUTH EVERY DAY ?  hydrochlorothiazide (HYDRODIURIL) 25 MG tablet, TAKE 1 TABLET BY MOUTH EVERY DAY ?  losartan (COZAAR) 100 MG tablet, TAKE 1 TABLET BY MOUTH EVERY DAY ?  losartan-hydrochlorothiazide (HYZAAR) 100-25 MG tablet, TAKE 1 TABLET BY MOUTH EVERY DAY ?  rosuvastatin (CRESTOR) 10 MG tablet, TAKE 1 TABLET BY MOUTH 3 TIMES PER WEEK AND INCREASE TO ONCE A DAY AFTER 2 WEEKS . ? ? ?Current Outpatient Medications (Analgesics):  ?  aspirin 81 MG chewable tablet, Chew by mouth daily. ?  ibuprofen (ADVIL) 200 MG tablet, Take 200 mg by mouth every 6 (six) hours as needed. ? ?Current Outpatient Medications (Hematological):  ?  Cyanocobalamin (VITAMIN B-12 PO), Take 5,000 mcg by mouth. Liquid ? ?Current Outpatient Medications (Other):  ?  estradiol (ESTRACE) 0.1 MG/GM vaginal cream, Place 1 g vaginally 2 (two) times a week. ?  gabapentin (NEURONTIN) 100 MG capsule, TAKE 2 CAPSULES (200 MG TOTAL) BY MOUTH AT BEDTIME. ?  MAGNESIUM PO, Take by mouth daily. ?  MULTIPLE VITAMIN PO, Take 1 capsule by mouth daily. ?  vitamin C (ASCORBIC ACID) 500 MG tablet, Take 500 mg by mouth daily. ?  Vitamin D, Ergocalciferol, (DRISDOL) 1.25 MG (50000 UNIT) CAPS capsule, TAKE 1 CAPSULE (50,000 UNITS TOTAL) BY MOUTH EVERY 7 (SEVEN) DAYS ? ? ?Reviewed prior external information including notes and imaging from  ?primary care provider ?As well as notes that were available from care everywhere and other healthcare systems. ? ?Past medical history, social, surgical and family history all reviewed in electronic medical record.  No pertanent information unless stated regarding to the chief complaint.  ? ?Review of Systems: ? No headache, visual changes, nausea, vomiting, diarrhea, constipation, dizziness, abdominal pain, skin rash, fevers, chills, night sweats, weight loss,  swollen lymph nodes, body aches, joint swelling, chest pain, shortness of breath, mood changes. POSITIVE muscle aches ? ?Objective  ?Blood pressure 120/68, pulse 78, height '5\' 1"'$  (1.549 m), weight 161 lb (73 kg), SpO2 99 %. ?  ?General: No apparent distress alert and oriented x3 mood and affect normal, dressed appropriately.  ?HEENT: Pupils equal, extraocular movements intact  ?Respiratory: Patient's speak in full sentences and does not appear short of breath  ?Cardiovascular: No lower extremity edema, non tender, no erythema  ?Gait normal  with good balance and coordination.  ?MSK:  shoulder exam shows patient does have positive impingement noted.  Patient does have rotator cuff strength 4 out of 5 compared to the contralateral side.  Some mild limited range of motion noted.  Neck exam does have some loss of lordosis and does have lacking 5 degrees of extension but negative Spurling's.  Patient does have weakness noted in the C8 distribution on the left side. ? ? 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 ?Impression: Left shoulder rotator cuff tear partial with retraction ?  ?Impression and Recommendations:  ?  ? ?The above documentation has been reviewed and is accurate and complete Lyndal Pulley, DO ? ? ? ?

## 2022-03-15 ENCOUNTER — Other Ambulatory Visit: Payer: Self-pay

## 2022-03-15 ENCOUNTER — Ambulatory Visit (INDEPENDENT_AMBULATORY_CARE_PROVIDER_SITE_OTHER): Payer: Medicare Other | Admitting: Family Medicine

## 2022-03-15 ENCOUNTER — Encounter: Payer: Self-pay | Admitting: Family Medicine

## 2022-03-15 VITALS — BP 120/68 | HR 78 | Ht 61.0 in | Wt 161.0 lb

## 2022-03-15 DIAGNOSIS — M75112 Incomplete rotator cuff tear or rupture of left shoulder, not specified as traumatic: Secondary | ICD-10-CM | POA: Insufficient documentation

## 2022-03-15 DIAGNOSIS — M75102 Unspecified rotator cuff tear or rupture of left shoulder, not specified as traumatic: Secondary | ICD-10-CM

## 2022-03-15 NOTE — Assessment & Plan Note (Signed)
Partial tear noted.  We will discuss with patient about different treatment options.  Patient has elected to try with formal physical therapy.  Differential does include potential cervical radiculopathy.  Patient has a negative Spurling's today but does have weakness in the C8 distribution.  Discussed with patient about different medications.  Patient is already on gabapentin and could potentially increase the dosing if necessary.  We will monitor at the moment.  Discussed icing regimen.  Follow-up with me again weeks to see how patient is responding.  If worsening pain we will consider injection or advanced imaging. ?

## 2022-03-15 NOTE — Patient Instructions (Signed)
PT [facility] ?See you again in 6-8 weeks ?

## 2022-03-19 ENCOUNTER — Other Ambulatory Visit: Payer: Self-pay | Admitting: Internal Medicine

## 2022-03-21 DIAGNOSIS — M75102 Unspecified rotator cuff tear or rupture of left shoulder, not specified as traumatic: Secondary | ICD-10-CM | POA: Diagnosis not present

## 2022-03-23 ENCOUNTER — Telehealth: Payer: Self-pay | Admitting: Internal Medicine

## 2022-03-23 DIAGNOSIS — M75102 Unspecified rotator cuff tear or rupture of left shoulder, not specified as traumatic: Secondary | ICD-10-CM | POA: Diagnosis not present

## 2022-03-23 NOTE — Telephone Encounter (Signed)
Pt was sent to triage nurse and decline to give triage nurse her info she did not feeling comfortable and felt like triage nurse should of been able to look at her chart. Pt is having trouble breathing and also has lump on her right shoulder. Pt does have an appt with dr fry on Monday and wanted to speak with triage nurse in office.  ?

## 2022-03-23 NOTE — Telephone Encounter (Signed)
Pt states she is having discomfort with the lump. Describes the lump as intermittently hard, feels like it's pressing against something. Pt states lump is on right shoulder between shoulder & neck; been there over year. States she can't tell if it's gotten larger during that time. Pt rates discomfort (only when lump is hard) as 3 on 0-10 pain scale when she feels it. Pt cannot say that she's having difficulty breathing at this time; this RN does not appreciate SOB over the phone at this time. Suspect SOB (when noticed by pt) is directly r/t concern over lump. Pt has appt scheduled with Dr Sarajane Jews on Monday Apr 3. She is urged to keep this appt & f/u with UC if SOB is problematic. Pt verb understanding. ?

## 2022-03-24 ENCOUNTER — Other Ambulatory Visit: Payer: Self-pay | Admitting: Family Medicine

## 2022-03-26 ENCOUNTER — Ambulatory Visit (INDEPENDENT_AMBULATORY_CARE_PROVIDER_SITE_OTHER): Payer: Medicare Other | Admitting: Family Medicine

## 2022-03-26 ENCOUNTER — Encounter: Payer: Self-pay | Admitting: Family Medicine

## 2022-03-26 VITALS — BP 100/62 | HR 71 | Temp 98.4°F | Wt 158.8 lb

## 2022-03-26 DIAGNOSIS — D1721 Benign lipomatous neoplasm of skin and subcutaneous tissue of right arm: Secondary | ICD-10-CM | POA: Diagnosis not present

## 2022-03-26 DIAGNOSIS — M75102 Unspecified rotator cuff tear or rupture of left shoulder, not specified as traumatic: Secondary | ICD-10-CM | POA: Diagnosis not present

## 2022-03-26 NOTE — Progress Notes (Signed)
? ?  Subjective:  ? ? Patient ID: Kaylee Adams, female    DOB: Dec 01, 1951, 71 y.o.   MRN: [id number] ? ?HPI ?Here for a knot on the right shoulder that first appeared about one year ago. No hx of trauma. It is slowly enlarging. 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 Sometimes it swells and "gets hard", and when it does this is causes pain in the top of the right shoulder which shoots up the right side of the neck. No SOB. She does not treat this with anything.  ? ? ?Review of Systems  ?Constitutional: Negative.   ?Respiratory: Negative.    ?Cardiovascular: Negative.   ?Musculoskeletal:  Positive for myalgias.  ? ?   ?Objective:  ? Physical Exam ?Constitutional:   ?   General: She is not in acute distress. ?   Appearance: Normal appearance.  ?Cardiovascular:  ?   Rate and Rhythm: Normal rate and regular rhythm.  ?   Pulses: Normal pulses.  ?   Heart sounds: Normal heart sounds.  ?Pulmonary:  ?   Effort: Pulmonary effort is normal.  ?   Breath sounds: Normal breath sounds.  ?Musculoskeletal:  ?   Comments: There is a firm mobile slightly tender mass under the skin of the superior right shoulder which extends to the distal clavicle.   ?Neurological:  ?   Mental Status: She is alert.  ? ? ? ? ? ?   ?Assessment & Plan:  ?Painful lipoma. We will refer her to Surgery to consider removal.  ?Alysia Penna, MD ? ? ?

## 2022-03-28 DIAGNOSIS — M75102 Unspecified rotator cuff tear or rupture of left shoulder, not specified as traumatic: Secondary | ICD-10-CM | POA: Diagnosis not present

## 2022-04-02 DIAGNOSIS — M75102 Unspecified rotator cuff tear or rupture of left shoulder, not specified as traumatic: Secondary | ICD-10-CM | POA: Diagnosis not present

## 2022-04-04 ENCOUNTER — Telehealth: Payer: Self-pay | Admitting: Pharmacist

## 2022-04-04 DIAGNOSIS — M75102 Unspecified rotator cuff tear or rupture of left shoulder, not specified as traumatic: Secondary | ICD-10-CM | POA: Diagnosis not present

## 2022-04-04 NOTE — Chronic Care Management (AMB) (Signed)
? ? ?Chronic Care Management ?Pharmacy Assistant  ? ?Name: Kaylee Adams  MRN: [id number] DOB: [birth year] ? ?Reason for Encounter: Disease State ?  ?Conditions to be addressed/monitored: ?HTN ? ?Recent office visits:  ?03/26/22 Laurey Morale, MD - Patient presented for Lipoma of right shoulder. Stopped Estradiol. ? ?03/13/22 Panosh, Standley Brooking, MD - Patient presented for left arm numbness with pain and other concerns. No medication changes. ? ?02/01/22 Panosh, Standley Brooking, MD - Patient presented for Viral respiratory infection and other concerns. No medication changes. ? ? ?Recent consult visits:  ?03/15/22 Lyndal Pulley, DO (Sports Med) - Patient presented for Tear of left rotator cuff, unspecified tear extent and other concerns. No medication changes. ? ?02/22/22 Eugenia Pancoast - Patient presented for Segmental and somatic dysfunction of cervical region and other concerns. No other visit details available. ? ?02/14/22 Eugenia Pancoast - Patient presented for Segmental and somatic dysfunction of cervical region and other concerns. No other visit details available. ? ?02/01/22 Kalman Drape - Patient presented for Contact with and suspected exposure to COVID. No other visit details available ? ?01/26/22 Eugenia Pancoast - Patient presented for Segmental and somatic dysfunction of cervical region and other concerns. No other visit details available. ? ?Hospital visits:  ?None in previous 6 months ? ?Medications: ?Outpatient Encounter Medications as of 04/04/2022  ?Medication Sig  ? amLODipine (NORVASC) 10 MG tablet TAKE 1 TABLET BY MOUTH EVERY DAY  ? aspirin 81 MG chewable tablet Chew by mouth daily.  ? Cyanocobalamin (VITAMIN B-12 PO) Take 5,000 mcg by mouth. Liquid  ? gabapentin (NEURONTIN) 100 MG capsule TAKE 2 CAPSULES (200 MG TOTAL) BY MOUTH AT BEDTIME.  ? hydrochlorothiazide (HYDRODIURIL) 25 MG tablet TAKE 1 TABLET BY MOUTH EVERY DAY  ? ibuprofen (ADVIL) 200 MG tablet Take 200 mg by mouth every 6 (six) hours as needed.  ?  losartan (COZAAR) 100 MG tablet TAKE 1 TABLET BY MOUTH EVERY DAY (Patient not taking: Reported on 03/26/2022)  ? losartan-hydrochlorothiazide (HYZAAR) 100-25 MG tablet TAKE 1 TABLET BY MOUTH EVERY DAY  ? MAGNESIUM PO Take by mouth daily.  ? MULTIPLE VITAMIN PO Take 1 capsule by mouth daily.  ? rosuvastatin (CRESTOR) 10 MG tablet TAKE 1 TABLET BY MOUTH 3 TIMES PER WEEK AND INCREASE TO ONCE A DAY AFTER 2 WEEKS .  ? vitamin C (ASCORBIC ACID) 500 MG tablet Take 500 mg by mouth daily.  ? Vitamin D, Ergocalciferol, (DRISDOL) 1.25 MG (50000 UNIT) CAPS capsule TAKE 1 CAPSULE (50,000 UNITS TOTAL) BY MOUTH EVERY 7 (SEVEN) DAYS  ? ?No facility-administered encounter medications on file as of 04/04/2022.  ?Reviewed chart prior to disease state call. Spoke with patient regarding BP ? ?Recent Office Vitals: ?BP Readings from Last 3 Encounters:  ?03/26/22 100/62  ?03/15/22 120/68  ?03/13/22 133/66  ? ?Pulse Readings from Last 3 Encounters:  ?03/26/22 71  ?03/15/22 78  ?03/13/22 87  ?  ?Wt Readings from Last 3 Encounters:  ?03/26/22 158 lb 12.8 oz (72 kg)  ?03/15/22 161 lb (73 kg)  ?03/13/22 159 lb (72.1 kg)  ?  ? ?Kidney Function ?Lab Results  ?Component Value Date/Time  ? CREATININE 0.82 05/25/2021 07:57 AM  ? CREATININE 0.75 02/27/2021 10:36 AM  ? CREATININE 0.69 07/29/2020 09:15 AM  ? CREATININE 0.77 12/10/2013 09:13 AM  ? GFR 73.01 05/25/2021 07:57 AM  ? GFRNONAA 54 (L) 10/29/2013 08:41 AM  ? GFRAA 63 (L) 10/29/2013 08:41 AM  ? ? ? ?  Latest Ref Rng & Units  05/25/2021  ?  7:57 AM 02/27/2021  ? 10:36 AM 07/29/2020  ?  9:15 AM  ?BMP  ?Glucose 70 - 99 mg/dL 82   83   95    ?BUN 6 - 23 mg/dL '20   18   15    '$ ?Creatinine 0.40 - 1.20 mg/dL 0.82   0.75   0.69    ?BUN/Creat Ratio 6 - 22 (calc)   NOT APPLICABLE    ?Sodium 135 - 145 mEq/L 143   141   142    ?Potassium 3.5 - 5.1 mEq/L 4.1   3.6   4.0    ?Chloride 96 - 112 mEq/L 104   106   106    ?CO2 19 - 32 mEq/L '25   29   25    '$ ?Calcium 8.4 - 10.5 mg/dL 10.9   10.2   10.7    ? ? ?Current  antihypertensive regimen:  ?Amlodipine 10 mg 1 tablet daily ?Hydrochlorothiazide 25 mg 1 tablet daily ?Losartan 100 mg 1 tablet daily ?Unable to reach for assessment ? ?Care Gaps: ?AWV- 8/21 patient & office aware to sched ?BP- 100/62 (03/26/22) ?CCM- Need ? ?Star Rating Drugs: ?Losartan- HCTZ- 100-25 mg - Last filled 03/19/22 90 [facility] ?Rosuvastatin (Cerestor) 10 mg - Last filled 02/21/22 [facility] at [facility] ? ? ?Ned Clines CMA ?Clinical Pharmacist Assistant ?[phone] ? ?

## 2022-04-10 ENCOUNTER — Ambulatory Visit: Payer: Medicare Other | Admitting: Internal Medicine

## 2022-04-24 DIAGNOSIS — Z6831 Body mass index (BMI) 31.0-31.9, adult: Secondary | ICD-10-CM | POA: Diagnosis not present

## 2022-04-24 DIAGNOSIS — Z01419 Encounter for gynecological examination (general) (routine) without abnormal findings: Secondary | ICD-10-CM | POA: Diagnosis not present

## 2022-04-24 DIAGNOSIS — N952 Postmenopausal atrophic vaginitis: Secondary | ICD-10-CM | POA: Diagnosis not present

## 2022-04-24 DIAGNOSIS — Z1231 Encounter for screening mammogram for malignant neoplasm of breast: Secondary | ICD-10-CM | POA: Diagnosis not present

## 2022-04-24 DIAGNOSIS — N760 Acute vaginitis: Secondary | ICD-10-CM | POA: Diagnosis not present

## 2022-04-26 NOTE — Progress Notes (Signed)
?Charlann Boxer D.O. ?[facility] ?[place] ?Phone: [phone] ?Subjective:   ?I, Kaylee Adams, am serving as a Education administrator for Dr. Hulan Saas. ?This visit occurred during the SARS-CoV-2 public health emergency.  Safety protocols were in place, including screening questions prior to the visit, additional usage of staff PPE, and extensive cleaning of exam room while observing appropriate contact time as indicated for disinfecting solutions.  ? ?I'm seeing this patient by the request  of:  Panosh, Standley Brooking, MD ? ?CC: Left shoulder pain follow-up ? ?[id number]  ?03/15/2022 ?Partial tear noted.  We will discuss with patient about different treatment options.  Patient has elected to try with formal physical therapy.  Differential does include potential cervical radiculopathy.  Patient has a negative Spurling's today but does have weakness in the C8 distribution.  Discussed with patient about different medications.  Patient is already on gabapentin and could potentially increase the dosing if necessary.  We will monitor at the moment.  Discussed icing regimen.  Follow-up with me again weeks to see how patient is responding.  If worsening pain we will consider injection or advanced imaging. ? ?Updated 04/30/2022 ?Kaylee Adams is a 71 y.o. female coming in with complaint of left shoulder pain. Does feel better. Feel like PT has been helping. No pain at the moment. No new complaints.  Patient states that she can do all daily activities without any significant difficulty. ? ? ? ?  ? ?Past Medical History:  ?Diagnosis Date  ? Allergy   ? occ takes otc allergy pill  ? CAD (coronary artery disease)   ? a. 11/2005 Cath: nonobs dzs, 30% LAD lesion on cath ;  b. 11/2005 Echo: NL EF;  c. 04/2012 Echo: EF 55-60%, Gr 2 DD;  d. 04/2012 Ex MV: EF 72%, ST depression in recovery with NL perfusion imaging - HTN response to exercise.  ? Chest pressure "fatigue" 05/08/2012  ? GERD (gastroesophageal reflux  disease)   ? Hepatic cyst   ? on ct [place]  ? HLD (hyperlipidemia)   ? HTN (hypertension)   ? Midsternal chest pain   ? cardiology eval with non-ischemic stress test 04/2012  ? Upper airway cough syndrome 04/29/2015  ? Trial of max gerd rx 04/29/2015 >>>    ? ?Past Surgical History:  ?Procedure Laterality Date  ? ABDOMINAL HYSTERECTOMY    ? fibroid tumors  ? BREAST BIOPSY Right   ? BREAST EXCISIONAL BIOPSY Right   ? child birth x 5    ? CHOLECYSTECTOMY    ? COLONOSCOPY  01/09/2008  ? SHOULDER ARTHROSCOPY WITH ROTATOR CUFF REPAIR AND SUBACROMIAL DECOMPRESSION Right 11/03/2013  ? Procedure: RIGHT SHOULDER ARTHROSCOPY WITH DEBRIDEMENT, ROTATOR CUFF REPAIR AND SUBACROMIAL DECOMPRESSION, DISTAL CLAVICLE RESECTION;  Surgeon: Mcarthur Rossetti, MD;  Location: [facility];  Service: Orthopedics;  Laterality: Right;  ? VESICOVAGINAL FISTULA CLOSURE W/ TAH    ? for fibroid tumors  ? ?Social History  ? ?Socioeconomic History  ? Marital status: Married  ?  Spouse name: Not on file  ? Number of children: 5  ? Years of education: Not on file  ? Highest education level: Not on file  ?Occupational History  ?  Employer: [employer]  ?Tobacco Use  ? Smoking status: Never  ? Smokeless tobacco: Never  ?Vaping Use  ? Vaping Use: Never used  ?Substance and Sexual Activity  ? Alcohol use: No  ? Drug use: No  ? Sexual activity: Not on file  ?  Other Topics Concern  ? Not on file  ?Social History Narrative  ? Married, full time Network engineer. 3 pets   ? [place]  ? 6-8 hours sleep   ? Right Handed  ? Lives in a two story home.   ? Drinks 1 cup of Coffee  ? ?Social Determinants of Health  ? ?Financial Resource Strain: Low Risk   ? Difficulty of Paying Living Expenses: Not hard at all  ?Food Insecurity: Not on file  ?Transportation Needs: No Transportation Needs  ? Lack of Transportation (Medical): No  ? Lack of Transportation (Non-Medical): No  ?Physical Activity: Not on file  ?Stress: Not on file  ?Social Connections: Not on file  ? ?Allergies   ?Allergen Reactions  ? Lisinopril   ?  REACTION: cough  ? Penicillins Nausea Only  ? ?Family History  ?Problem Relation Age of Onset  ? Breast cancer Sister   ? Thyroid disease Sister   ? Hypertension Mother   ? Colon cancer Brother 27  ?     dx'd about age 71 per pt-   ? Prostate cancer Brother   ? Lung cancer Brother   ? Liver cancer Brother   ? ALS Brother   ? Healthy Daughter   ? Healthy Daughter   ? Healthy Son   ? Healthy Son   ? Healthy Son   ? Colon polyps Neg Hx   ? Esophageal cancer Neg Hx   ? Rectal cancer Neg Hx   ? Stomach cancer Neg Hx   ? ? ? ?Current Outpatient Medications (Cardiovascular):  ?  amLODipine (NORVASC) 10 MG tablet, TAKE 1 TABLET BY MOUTH EVERY DAY ?  losartan-hydrochlorothiazide (HYZAAR) 100-25 MG tablet, TAKE 1 TABLET BY MOUTH EVERY DAY ? ? ?Current Outpatient Medications (Analgesics):  ?  aspirin 81 MG chewable tablet, Chew by mouth daily. ?  ibuprofen (ADVIL) 200 MG tablet, Take 200 mg by mouth every 6 (six) hours as needed. ? ?Current Outpatient Medications (Hematological):  ?  Cyanocobalamin (VITAMIN B-12 PO), Take 5,000 mcg by mouth. Liquid ? ?Current Outpatient Medications (Other):  ?  gabapentin (NEURONTIN) 100 MG capsule, TAKE 2 CAPSULES (200 MG TOTAL) BY MOUTH AT BEDTIME. ?  MAGNESIUM PO, Take by mouth daily. ?  MULTIPLE VITAMIN PO, Take 1 capsule by mouth daily. ?  vitamin C (ASCORBIC ACID) 500 MG tablet, Take 500 mg by mouth daily. ?  Vitamin D, Ergocalciferol, (DRISDOL) 1.25 MG (50000 UNIT) CAPS capsule, TAKE 1 CAPSULE (50,000 UNITS TOTAL) BY MOUTH EVERY 7 (SEVEN) DAYS ? ? ?Objective  ?Blood pressure 128/72, pulse 77, height '5\' 1"'$  (1.549 m), weight 160 lb (72.6 kg), SpO2 93 %. ?  ?General: No apparent distress alert and oriented x3 mood and affect normal, dressed appropriately.  ?HEENT: Pupils equal, extraocular movements intact  ?Respiratory: Patient's speak in full sentences and does not appear short of breath  ?Cardiovascular: No lower extremity edema, non tender, no  erythema  ?Gait normal with good balance and coordination.  ?MSK:  left shoulder does have tightness noted positive hawkins ?4/5 strength  ? ?Limited muscular skeletal ultrasound was performed and interpreted by Hulan Saas, M  ?Limited ultrasound of patient's left rotator cuff shows the subscapularis has significant hypoechoic changes that is consistent with a severe tendinitis.  Patient supraspinatus does show some improvement with decreased hypoechoic changes but still has some intersubstance tearing and mild retraction at the endplate noted. ?Impression: Interval improvement but still partial tearing of the left rotator cuff ? ?  ?  Impression and Recommendations:  ?  ? ?The above documentation has been reviewed and is accurate and complete Lyndal Pulley, DO ? ? ? ?

## 2022-04-29 NOTE — Progress Notes (Signed)
? ?Chief Complaint  ?Patient presents with  ? Annual Exam  ?  1/2 cup coffee sugar and cream @ 7:30  ? ? ?HPI: ?Patient  Kaylee Adams  71 y.o. comes in today for  Chronic disease management yearly check ? ?BP   hyzaar amlodipine blood pressure seems to be controlled no side effects ?HLD  crestor was ordered in past but patient never took it she just does not want to take something that could affect her body. ?She is taking vitamin D high-dose weekly per Dr. Tamala Julian. ?She is still taking gabapentin at night which has been helpful. ?Has  appt Dr Tamala Julian today  ? ?Health Maintenance  ?Topic Date Due  ? MAMMOGRAM  09/13/2022 (Originally 02/22/2022)  ? COVID-19 Vaccine (4 - Booster for [place] series) 09/13/2022 (Originally 09/21/2021)  ? Zoster Vaccines- Shingrix (1 of 2) 09/13/2022 (Originally 01/10/1970)  ? INFLUENZA VACCINE  07/24/2022  ? COLONOSCOPY (Pts 45-2yr Insurance coverage will need to be confirmed)  09/09/2024  ? TETANUS/TDAP  02/01/2027  ? Pneumonia Vaccine 70 Years old  Completed  ? DEXA SCAN  Completed  ? Hepatitis C Screening  Completed  ? HPV VACCINES  Aged Out  ? ?Health Maintenance Review ?LIFESTYLE:  ?Exercise:  walking   back up to 1 moiles 3 d per week .  ?Tobacco/ETS: ?Alcohol:   no ?Sugar beverages:  coffee sugar honey and cream  ?Sleep: good  ?Drug use: no ?HH of   2 no pets  ? ? ? ? ?ROS: No major changes hearing vision breasts are always sore no new orthopedic musculoskeletal symptoms.  No chest pain shortness of breath heart symptoms feels that exercise and walking has been helpful. ? ?REST of 12 system review negative except as per HPI ? ? ?Past Medical History:  ?Diagnosis Date  ? Allergy   ? occ takes otc allergy pill  ? CAD (coronary artery disease)   ? a. 11/2005 Cath: nonobs dzs, 30% LAD lesion on cath ;  b. 11/2005 Echo: NL EF;  c. 04/2012 Echo: EF 55-60%, Gr 2 DD;  d. 04/2012 Ex MV: EF 72%, ST depression in recovery with NL perfusion imaging - HTN response to exercise.  ? Chest pressure  "fatigue" 05/08/2012  ? GERD (gastroesophageal reflux disease)   ? Hepatic cyst   ? on ct [place] ? HLD (hyperlipidemia)   ? HTN (hypertension)   ? Midsternal chest pain   ? cardiology eval with non-ischemic stress test 04/2012  ? Upper airway cough syndrome 04/29/2015  ? Trial of max gerd rx 04/29/2015 >>>    ? ? ?Past Surgical History:  ?Procedure Laterality Date  ? ABDOMINAL HYSTERECTOMY    ? fibroid tumors  ? BREAST BIOPSY Right   ? BREAST EXCISIONAL BIOPSY Right   ? child birth x 5    ? CHOLECYSTECTOMY    ? COLONOSCOPY  01/09/2008  ? SHOULDER ARTHROSCOPY WITH ROTATOR CUFF REPAIR AND SUBACROMIAL DECOMPRESSION Right 11/03/2013  ? Procedure: RIGHT SHOULDER ARTHROSCOPY WITH DEBRIDEMENT, ROTATOR CUFF REPAIR AND SUBACROMIAL DECOMPRESSION, DISTAL CLAVICLE RESECTION;  Surgeon: CMcarthur Rossetti MD;  Location: [facility]  Service: Orthopedics;  Laterality: Right;  ? VESICOVAGINAL FISTULA CLOSURE W/ TAH    ? for fibroid tumors  ? ? ?Family History  ?Problem Relation Age of Onset  ? Breast cancer Sister   ? Thyroid disease Sister   ? Hypertension Mother   ? Colon cancer Brother 663 ?     dx'd about age 6549per pt-   ?  Prostate cancer Brother   ? Lung cancer Brother   ? Liver cancer Brother   ? ALS Brother   ? Healthy Daughter   ? Healthy Daughter   ? Healthy Son   ? Healthy Son   ? Healthy Son   ? Colon polyps Neg Hx   ? Esophageal cancer Neg Hx   ? Rectal cancer Neg Hx   ? Stomach cancer Neg Hx   ? ? ?Social History  ? ?Socioeconomic History  ? Marital status: Married  ?  Spouse name: Not on file  ? Number of children: 5  ? Years of education: Not on file  ? Highest education level: Not on file  ?Occupational History  ?  Employer: [employer]  ?Tobacco Use  ? Smoking status: Never  ? Smokeless tobacco: Never  ?Vaping Use  ? Vaping Use: Never used  ?Substance and Sexual Activity  ? Alcohol use: No  ? Drug use: No  ? Sexual activity: Not on file  ?Other Topics Concern  ? Not on file  ?Social History Narrative  ? Married, full  time Network engineer. 3 pets   ? [place]  ? 6-8 hours sleep   ? Right Handed  ? Lives in a two story home.   ? Drinks 1 cup of Coffee  ? ?Social Determinants of Health  ? ?Financial Resource Strain: Low Risk   ? Difficulty of Paying Living Expenses: Not hard at all  ?Food Insecurity: Not on file  ?Transportation Needs: No Transportation Needs  ? Lack of Transportation (Medical): No  ? Lack of Transportation (Non-Medical): No  ?Physical Activity: Not on file  ?Stress: Not on file  ?Social Connections: Not on file  ? ? ?Outpatient Medications Prior to Visit  ?Medication Sig Dispense Refill  ? amLODipine (NORVASC) 10 MG tablet TAKE 1 TABLET BY MOUTH EVERY DAY 90 tablet 1  ? aspirin 81 MG chewable tablet Chew by mouth daily.    ? Cyanocobalamin (VITAMIN B-12 PO) Take 5,000 mcg by mouth. Liquid    ? gabapentin (NEURONTIN) 100 MG capsule TAKE 2 CAPSULES (200 MG TOTAL) BY MOUTH AT BEDTIME. 180 capsule 3  ? ibuprofen (ADVIL) 200 MG tablet Take 200 mg by mouth every 6 (six) hours as needed.    ? losartan-hydrochlorothiazide (HYZAAR) 100-25 MG tablet TAKE 1 TABLET BY MOUTH EVERY DAY 90 tablet 0  ? MAGNESIUM PO Take by mouth daily.    ? MULTIPLE VITAMIN PO Take 1 capsule by mouth daily.    ? vitamin C (ASCORBIC ACID) 500 MG tablet Take 500 mg by mouth daily.    ? Vitamin D, Ergocalciferol, (DRISDOL) 1.25 MG (50000 UNIT) CAPS capsule TAKE 1 CAPSULE (50,000 UNITS TOTAL) BY MOUTH EVERY 7 (SEVEN) DAYS 12 capsule 0  ? hydrochlorothiazide (HYDRODIURIL) 25 MG tablet TAKE 1 TABLET BY MOUTH EVERY DAY 90 tablet 3  ? losartan (COZAAR) 100 MG tablet TAKE 1 TABLET BY MOUTH EVERY DAY 90 tablet 3  ? rosuvastatin (CRESTOR) 10 MG tablet TAKE 1 TABLET BY MOUTH 3 TIMES PER WEEK AND INCREASE TO ONCE A DAY AFTER 2 WEEKS . 90 tablet 1  ? ?No facility-administered medications prior to visit.  ? ? ? ?EXAM: ? ?BP 128/70 (BP Location: Left Arm, Patient Position: Sitting, Cuff Size: Normal)   Pulse 69   Temp 98.4 ?F (36.9 ?C) (Oral)   Ht '5\' 1"'$  (1.549  m)   Wt 159 lb 3.2 oz (72.2 kg)   SpO2 100%   BMI 30.08 kg/m?  ? ?  Body mass index is 30.08 kg/m?. ?Wt Readings from Last 3 Encounters:  ?04/30/22 159 lb 3.2 oz (72.2 kg)  ?03/26/22 158 lb 12.8 oz (72 kg)  ?03/15/22 161 lb (73 kg)  ? ? ?Physical Exam: ?Vital signs reviewed ?MMC:RFVO is a well-developed well-nourished alert cooperative    who appearsr stated age in no acute distress.  ?HEENT: normocephalic atraumatic , Eyes: PERRL EOM's full, conjunctiva clear, Nares: paten,t no deformity discharge or tenderness., Ears: no deformity EAC's clear TMs with normal landmarks. NECK: supple without masses, bruits.  Thyroid minimally palpable ?CHEST/PULM:  Clear to auscultation and percussion breath sounds equal no wheeze , rales or rhonchi. No chest wall deformities or tenderness. ?Breast: normal by inspection . No dimpling, discharge, masses, tenderness or discharge . ?CV: PMI is nondisplaced, S1 S2 no gallops, murmurs, rubs. Peripheral pulses are full without delay.N.  ?ABDOMEN: Bowel sounds normal nontender  No guard or rebound, no hepato splenomegal no CVA tenderness.   ?Extremtities:  No clubbing cyanosis or edema, no acute joint swelling foot some cavus and stiffness no edema NEURO:  Oriented x3, cranial nerves 3-12 appear to be intact, no obvious focal weakness, ?SKIN: No acute rashes normal turgor, color, no bruising or petechiae. ?PSYCH: Oriented, good eye contact, no obvious depression anxiety, cognition and judgment appear normal. ?LN: no cervical axillaryadenopathy ? ?Lab Results  ?Component Value Date  ? WBC 4.9 05/25/2021  ? HGB 13.9 05/25/2021  ? HCT 41.4 05/25/2021  ? PLT 329.0 05/25/2021  ? GLUCOSE 82 05/25/2021  ? CHOL 250 (H) 02/27/2021  ? TRIG 89.0 02/27/2021  ? HDL 53.20 02/27/2021  ? LDLDIRECT 153.2 06/23/2012  ? LDLCALC 179 (H) 02/27/2021  ? ALT 13 02/27/2021  ? AST 15 02/27/2021  ? NA 143 05/25/2021  ? K 4.1 05/25/2021  ? CL 104 05/25/2021  ? CREATININE 0.82 05/25/2021  ? BUN 20 05/25/2021  ? CO2  25 05/25/2021  ? TSH 1.14 05/25/2021  ? INR 0.9 11/29/2008  ? HGBA1C 5.7 02/12/2008  ? ? ?BP Readings from Last 3 Encounters:  ?04/30/22 128/70  ?03/26/22 100/62  ?03/15/22 120/68  ? ? ?Lab r plan reviewed with

## 2022-04-30 ENCOUNTER — Encounter: Payer: Self-pay | Admitting: Internal Medicine

## 2022-04-30 ENCOUNTER — Ambulatory Visit (INDEPENDENT_AMBULATORY_CARE_PROVIDER_SITE_OTHER): Payer: Medicare Other | Admitting: Internal Medicine

## 2022-04-30 ENCOUNTER — Ambulatory Visit: Payer: Self-pay

## 2022-04-30 ENCOUNTER — Ambulatory Visit (INDEPENDENT_AMBULATORY_CARE_PROVIDER_SITE_OTHER): Payer: Medicare Other | Admitting: Family Medicine

## 2022-04-30 VITALS — BP 128/72 | HR 77 | Ht 61.0 in | Wt 160.0 lb

## 2022-04-30 VITALS — BP 128/70 | HR 69 | Temp 98.4°F | Ht 61.0 in | Wt 159.2 lb

## 2022-04-30 DIAGNOSIS — E785 Hyperlipidemia, unspecified: Secondary | ICD-10-CM

## 2022-04-30 DIAGNOSIS — M75112 Incomplete rotator cuff tear or rupture of left shoulder, not specified as traumatic: Secondary | ICD-10-CM | POA: Diagnosis not present

## 2022-04-30 DIAGNOSIS — E042 Nontoxic multinodular goiter: Secondary | ICD-10-CM

## 2022-04-30 DIAGNOSIS — Z79899 Other long term (current) drug therapy: Secondary | ICD-10-CM | POA: Diagnosis not present

## 2022-04-30 DIAGNOSIS — I10 Essential (primary) hypertension: Secondary | ICD-10-CM

## 2022-04-30 DIAGNOSIS — Z5320 Procedure and treatment not carried out because of patient's decision for unspecified reasons: Secondary | ICD-10-CM | POA: Diagnosis not present

## 2022-04-30 DIAGNOSIS — E559 Vitamin D deficiency, unspecified: Secondary | ICD-10-CM | POA: Diagnosis not present

## 2022-04-30 NOTE — Assessment & Plan Note (Signed)
Patient still has a partial tear noted of the subscapularis and the supraspinatus but overall not anything significant.  We discussed icing regimen and home exercises, which activities to doing twice to avoid, increase activity slowly.  I believe with patient having improvement in the strength already at this time she should do well with conservative therapy and can follow-up with me as needed. ?

## 2022-04-30 NOTE — Patient Instructions (Addendum)
Good  to  see you today . ?Get appt for fasting labs  water black coffee and meds ok  ? ?Bp is ok today and continue same medication. ? ?Consider other options for cholesterol in addition to Continue lifestyle intervention healthy eating and exercise . That are not statin meds ?

## 2022-04-30 NOTE — Patient Instructions (Signed)
Good to see you! ?Continue to do exercises twice a week ?Keep hands in peripheral vision as much as possible ?See me when you need me ?

## 2022-05-01 ENCOUNTER — Other Ambulatory Visit (INDEPENDENT_AMBULATORY_CARE_PROVIDER_SITE_OTHER): Payer: Medicare Other

## 2022-05-01 DIAGNOSIS — E785 Hyperlipidemia, unspecified: Secondary | ICD-10-CM

## 2022-05-01 DIAGNOSIS — I10 Essential (primary) hypertension: Secondary | ICD-10-CM | POA: Diagnosis not present

## 2022-05-01 DIAGNOSIS — E042 Nontoxic multinodular goiter: Secondary | ICD-10-CM | POA: Diagnosis not present

## 2022-05-01 DIAGNOSIS — Z79899 Other long term (current) drug therapy: Secondary | ICD-10-CM | POA: Diagnosis not present

## 2022-05-01 DIAGNOSIS — E559 Vitamin D deficiency, unspecified: Secondary | ICD-10-CM | POA: Diagnosis not present

## 2022-05-01 LAB — CBC WITH DIFFERENTIAL/PLATELET
Basophils Absolute: 0 10*3/uL (ref 0.0–0.1)
Basophils Relative: 0.3 % (ref 0.0–3.0)
Eosinophils Absolute: 0 10*3/uL (ref 0.0–0.7)
Eosinophils Relative: 0.1 % (ref 0.0–5.0)
HCT: 40.8 % (ref 36.0–46.0)
Hemoglobin: 13.5 g/dL (ref 12.0–15.0)
Lymphocytes Relative: 39 % (ref 12.0–46.0)
Lymphs Abs: 2.1 10*3/uL (ref 0.7–4.0)
MCHC: 33 g/dL (ref 30.0–36.0)
MCV: 78.8 fl (ref 78.0–100.0)
Monocytes Absolute: 0.4 10*3/uL (ref 0.1–1.0)
Monocytes Relative: 8.2 % (ref 3.0–12.0)
Neutro Abs: 2.8 10*3/uL (ref 1.4–7.7)
Neutrophils Relative %: 52.4 % (ref 43.0–77.0)
Platelets: 305 10*3/uL (ref 150.0–400.0)
RBC: 5.18 Mil/uL — ABNORMAL HIGH (ref 3.87–5.11)
RDW: 13.6 % (ref 11.5–15.5)
WBC: 5.4 10*3/uL (ref 4.0–10.5)

## 2022-05-01 LAB — BASIC METABOLIC PANEL
BUN: 18 mg/dL (ref 6–23)
CO2: 29 mEq/L (ref 19–32)
Calcium: 10.2 mg/dL (ref 8.4–10.5)
Chloride: 104 mEq/L (ref 96–112)
Creatinine, Ser: 0.78 mg/dL (ref 0.40–1.20)
GFR: 77.02 mL/min (ref >60.00)
Glucose, Bld: 88 mg/dL (ref 70–99)
Potassium: 3.5 mEq/L (ref 3.5–5.1)
Sodium: 140 mEq/L (ref 135–145)

## 2022-05-01 LAB — HEPATIC FUNCTION PANEL
ALT: 18 U/L (ref 0–35)
AST: 19 U/L (ref 0–37)
Albumin: 4.1 g/dL (ref 3.5–5.2)
Alkaline Phosphatase: 56 U/L (ref 39–117)
Bilirubin, Direct: 0.1 mg/dL (ref 0.0–0.3)
Total Bilirubin: 0.4 mg/dL (ref 0.2–1.2)
Total Protein: 6.9 g/dL (ref 6.0–8.3)

## 2022-05-01 LAB — VITAMIN D 25 HYDROXY (VIT D DEFICIENCY, FRACTURES): VITD: 46.19 ng/mL (ref 30.00–100.00)

## 2022-05-01 LAB — LIPID PANEL
Cholesterol: 227 mg/dL — ABNORMAL HIGH (ref 0–200)
HDL: 59.3 mg/dL (ref >39.00)
LDL Cholesterol: 150 mg/dL — ABNORMAL HIGH (ref 0–99)
NonHDL: 168.15
Total CHOL/HDL Ratio: 4
Triglycerides: 90 mg/dL (ref 0.0–149.0)
VLDL: 18 mg/dL (ref 0.0–40.0)

## 2022-05-01 LAB — HEMOGLOBIN A1C: Hgb A1c MFr Bld: 5.6 % (ref 4.6–6.5)

## 2022-05-01 LAB — TSH: TSH: 0.64 u[IU]/mL (ref 0.35–5.50)

## 2022-05-06 ENCOUNTER — Encounter: Payer: Self-pay | Admitting: Internal Medicine

## 2022-05-06 NOTE — Progress Notes (Signed)
Cholesterol improved but ldl still high at 150. Blood sugar and kidney function is normal .   ?Continue lifestyle intervention healthy eating and exercise .  And Bp control  ?The 10-year ASCVD risk score (Arnett DK, et al., 2019) is: 13.5% ?  Values used to calculate the score: ?    Age: 71 years ?    Sex: Female ?    Is Non-Hispanic African American: Yes ?    Diabetic: No ?    Tobacco smoker: No ?    Systolic Blood Pressure: 584 mmHg ?    Is BP treated: Yes ?    HDL Cholesterol: 59.3 mg/dL ?    Total Cholesterol: 227 mg/dL ?

## 2022-05-07 NOTE — Telephone Encounter (Signed)
See result note.  

## 2022-05-31 DIAGNOSIS — H5203 Hypermetropia, bilateral: Secondary | ICD-10-CM | POA: Diagnosis not present

## 2022-05-31 DIAGNOSIS — H2513 Age-related nuclear cataract, bilateral: Secondary | ICD-10-CM | POA: Diagnosis not present

## 2022-05-31 DIAGNOSIS — H524 Presbyopia: Secondary | ICD-10-CM | POA: Diagnosis not present

## 2022-06-12 ENCOUNTER — Telehealth: Payer: Self-pay | Admitting: Internal Medicine

## 2022-06-12 NOTE — Telephone Encounter (Signed)
Spoke to patient to schedule awv  Patient declined

## 2022-06-13 ENCOUNTER — Telehealth: Payer: Self-pay | Admitting: Pharmacist

## 2022-06-13 NOTE — Chronic Care Management (AMB) (Signed)
Chronic Care Management Pharmacy Assistant   Name: Kaylee Adams  MRN: [id number] DOB: 1951-04-18  Reason for Encounter: Disease State Hypertension Assessment   Conditions to be addressed/monitored: HTN  Recent office visits:  04/30/22 Panosh, Standley Brooking, MD - Patient presented for essential hypertension and other concerns. Stopped HCTZ 25 mg. Stopped Losartan 100 mg. Stopped Rosuvastatin 10 mg. Prescribed combo Losartan HCTZ 100-25.  Recent consult visits:  04/30/22 Lyndal Pulley, DO (Sports Med) - Patient presented for Incomplete tear of rotator cuff unspecified whether traumatic. US done, no medication changes. Icing and home exercises recommended.  04/24/22 [name] (OBGYN) - Patient presented for Atrophic vaginitis and GYN exam. No medication changes.  Hospital visits:  None in previous 6 months  Medications: Outpatient Encounter Medications as of 06/13/2022  Medication Sig   amLODipine (NORVASC) 10 MG tablet TAKE 1 TABLET BY MOUTH EVERY DAY   aspirin 81 MG chewable tablet Chew by mouth daily.   Cyanocobalamin (VITAMIN B-12 PO) Take 5,000 mcg by mouth. Liquid   gabapentin (NEURONTIN) 100 MG capsule TAKE 2 CAPSULES (200 MG TOTAL) BY MOUTH AT BEDTIME.   ibuprofen (ADVIL) 200 MG tablet Take 200 mg by mouth every 6 (six) hours as needed.   losartan-hydrochlorothiazide (HYZAAR) 100-25 MG tablet TAKE 1 TABLET BY MOUTH EVERY DAY   MAGNESIUM PO Take by mouth daily.   MULTIPLE VITAMIN PO Take 1 capsule by mouth daily.   vitamin C (ASCORBIC ACID) 500 MG tablet Take 500 mg by mouth daily.   Vitamin D, Ergocalciferol, (DRISDOL) 1.25 MG (50000 UNIT) CAPS capsule TAKE 1 CAPSULE (50,000 UNITS TOTAL) BY MOUTH EVERY 7 (SEVEN) DAYS   No facility-administered encounter medications on file as of 06/13/2022.   Reviewed chart prior to disease state call. Spoke with patient regarding BP  Recent Office Vitals: BP Readings from Last 3 Encounters:  04/30/22 128/72  04/30/22 128/70  03/26/22  100/62   Pulse Readings from Last 3 Encounters:  04/30/22 77  04/30/22 69  03/26/22 71    Wt Readings from Last 3 Encounters:  04/30/22 160 lb (72.6 kg)  04/30/22 159 lb 3.2 oz (72.2 kg)  03/26/22 158 lb 12.8 oz (72 kg)     Kidney Function Lab Results  Component Value Date/Time   CREATININE 0.78 05/01/2022 07:21 AM   CREATININE 0.82 05/25/2021 07:57 AM   CREATININE 0.69 07/29/2020 09:15 AM   CREATININE 0.77 12/10/2013 09:13 AM   GFR 77.02 05/01/2022 07:21 AM   GFRNONAA 54 (L) 10/29/2013 08:41 AM   GFRAA 63 (L) 10/29/2013 08:41 AM       Latest Ref Rng & Units 05/01/2022    7:21 AM 05/25/2021    7:57 AM 02/27/2021   10:36 AM  BMP  Glucose 70 - 99 mg/dL 88  82  83   BUN 6 - 23 mg/dL '18  20  18   '$ Creatinine 0.40 - 1.20 mg/dL 0.78  0.82  0.75   Sodium 135 - 145 mEq/L 140  143  141   Potassium 3.5 - 5.1 mEq/L 3.5  4.1  3.6   Chloride 96 - 112 mEq/L 104  104  106   CO2 19 - 32 mEq/L '29  25  29   '$ Calcium 8.4 - 10.5 mg/dL 10.2  10.9  10.2     Current antihypertensive regimen:  Amlodipine 10 mg 1 tablet daily Hydrochlorothiazide 25 mg 1 tablet daily Losartan 100 mg 1 tablet daily  Did not reach plan to re-attempt in a  few weeks   Care Gaps: AWV- per chart notes pt declined BP- 128/70 ( 04/30/22) CCM- Need  Star Rating Drugs: Losartan- HCTZ- 100-25 mg - Last filled 03/19/22 90 DS at [facility] Rosuvastatin (Cerestor) 10 mg - Last filled 02/21/22 90 DS at [facility] Pharmacist Assistant [phone]

## 2022-06-19 ENCOUNTER — Ambulatory Visit (INDEPENDENT_AMBULATORY_CARE_PROVIDER_SITE_OTHER): Payer: Medicare Other | Admitting: Family Medicine

## 2022-06-19 ENCOUNTER — Ambulatory Visit (INDEPENDENT_AMBULATORY_CARE_PROVIDER_SITE_OTHER): Payer: Medicare Other

## 2022-06-19 VITALS — BP 150/72 | HR 81 | Ht 61.0 in | Wt 159.6 lb

## 2022-06-19 DIAGNOSIS — M5412 Radiculopathy, cervical region: Secondary | ICD-10-CM | POA: Diagnosis not present

## 2022-06-19 DIAGNOSIS — M75112 Incomplete rotator cuff tear or rupture of left shoulder, not specified as traumatic: Secondary | ICD-10-CM

## 2022-06-19 DIAGNOSIS — M47812 Spondylosis without myelopathy or radiculopathy, cervical region: Secondary | ICD-10-CM | POA: Diagnosis not present

## 2022-06-19 DIAGNOSIS — M19012 Primary osteoarthritis, left shoulder: Secondary | ICD-10-CM | POA: Diagnosis not present

## 2022-06-19 DIAGNOSIS — M4802 Spinal stenosis, cervical region: Secondary | ICD-10-CM | POA: Diagnosis not present

## 2022-06-19 MED ORDER — PREDNISONE 50 MG PO TABS: ORAL_TABLET | ORAL | 0 refills | Status: DC

## 2022-06-21 NOTE — Progress Notes (Signed)
Left shoulder x-ray shows arthritis changes at the small joint on top of the shoulder.  This is the [facility] joint.

## 2022-06-21 NOTE — Progress Notes (Signed)
Cervical spine x-ray shows some medium arthritis changes

## 2022-06-25 ENCOUNTER — Emergency Department (HOSPITAL_COMMUNITY): Payer: Medicare Other

## 2022-06-25 ENCOUNTER — Encounter (HOSPITAL_COMMUNITY): Payer: Self-pay

## 2022-06-25 ENCOUNTER — Other Ambulatory Visit: Payer: Self-pay

## 2022-06-25 ENCOUNTER — Emergency Department (HOSPITAL_COMMUNITY)
Admission: EM | Admit: 2022-06-25 | Discharge: 2022-06-25 | Disposition: A | Discharge status: Home / Self Care | Payer: Medicare Other | Attending: Student | Admitting: Student

## 2022-06-25 DIAGNOSIS — R112 Nausea with vomiting, unspecified: Secondary | ICD-10-CM | POA: Insufficient documentation

## 2022-06-25 DIAGNOSIS — N2889 Other specified disorders of kidney and ureter: Secondary | ICD-10-CM | POA: Diagnosis not present

## 2022-06-25 DIAGNOSIS — Z7982 Long term (current) use of aspirin: Secondary | ICD-10-CM | POA: Diagnosis not present

## 2022-06-25 DIAGNOSIS — T189XXA Foreign body of alimentary tract, part unspecified, initial encounter: Secondary | ICD-10-CM | POA: Insufficient documentation

## 2022-06-25 DIAGNOSIS — Z79899 Other long term (current) drug therapy: Secondary | ICD-10-CM | POA: Diagnosis not present

## 2022-06-25 DIAGNOSIS — I10 Essential (primary) hypertension: Secondary | ICD-10-CM | POA: Diagnosis not present

## 2022-06-25 DIAGNOSIS — M47816 Spondylosis without myelopathy or radiculopathy, lumbar region: Secondary | ICD-10-CM | POA: Diagnosis not present

## 2022-06-25 DIAGNOSIS — K7689 Other specified diseases of liver: Secondary | ICD-10-CM | POA: Diagnosis not present

## 2022-06-25 DIAGNOSIS — R1013 Epigastric pain: Secondary | ICD-10-CM | POA: Diagnosis not present

## 2022-06-25 DIAGNOSIS — X58XXXA Exposure to other specified factors, initial encounter: Secondary | ICD-10-CM | POA: Diagnosis not present

## 2022-06-25 DIAGNOSIS — I2510 Atherosclerotic heart disease of native coronary artery without angina pectoris: Secondary | ICD-10-CM | POA: Insufficient documentation

## 2022-06-25 DIAGNOSIS — R1111 Vomiting without nausea: Secondary | ICD-10-CM

## 2022-06-25 DIAGNOSIS — Z9049 Acquired absence of other specified parts of digestive tract: Secondary | ICD-10-CM | POA: Diagnosis not present

## 2022-06-25 NOTE — ED Notes (Signed)
Pt to radiology at this time.

## 2022-06-25 NOTE — ED Triage Notes (Signed)
Patient was eating her dinner and one of the little pieces was missing off of the fork. Went to bed and when she got up at 12:30am felt sick on her stomach and threw up. Nauseous since then along with epigastric pain in her stomach. Did not see the piece in her vomit.

## 2022-06-25 NOTE — ED Provider Notes (Signed)
[facility] DEPT Provider Note  CSN: [id number] Arrival date & time: 06/25/22 0223  Chief Complaint(s) Swallowed Foreign Body  HPI Kaylee Adams is a 71 y.o. female with PMH GERD, HTN, HLD, CAD who presents emergency department for evaluation of nausea, vomiting and concern for swallowed foreign body.  Patient states that she was eating fruit with a plastic fork at approximately 6 PM today and awoke this evening with epigastric pain, nausea and vomiting.  No hematemesis or hematochezia.  Currently endorses epigastric abdominal pain but here in the emergency department denies nausea, headache, fever, chest pain, shortness of breath or other systemic symptoms.  Patient is concerned that she swallowed a small piece of a plastic fork.   Past Medical History Past Medical History:  Diagnosis Date   Allergy    occ takes otc allergy pill   CAD (coronary artery disease)    a. 11/2005 Cath: nonobs dzs, 30% LAD lesion on cath ;  b. 11/2005 Echo: NL EF;  c. 04/2012 Echo: EF 55-60%, Gr 2 DD;  d. 04/2012 Ex MV: EF 72%, ST depression in recovery with NL perfusion imaging - HTN response to exercise.   Chest pressure "fatigue" 05/08/2012   GERD (gastroesophageal reflux disease)    Hepatic cyst    on ct [place]   HLD (hyperlipidemia)    HTN (hypertension)    Midsternal chest pain    cardiology eval with non-ischemic stress test 04/2012   Upper airway cough syndrome 04/29/2015   Trial of max gerd rx 04/29/2015 >>>     Patient Active Problem List   Diagnosis Date Noted   Partial tear of left rotator cuff 03/15/2022   Pes cavus 01/29/2021   DDD (degenerative disc disease), cervical 01/29/2021   DDD (degenerative disc disease), lumbar 01/29/2021   Vitamin D deficiency 01/29/2021   Atrophy of muscle of right lower leg 07/29/2020   Peroneal cyst, right 06/16/2020   Degenerative joint disease of knee, right 10/13/2019   Right hip pain 10/13/2019   Essential hypertension 04/13/2015    Hoarseness 04/13/2015   Low TSH level 07/30/2014   Neck discomfort 07/30/2014   Goiter ? right  07/30/2014   Trouble swallowing hx of  07/30/2014   Visit for preventive health examination 07/14/2014   GERD (gastroesophageal reflux disease) 01/01/2014   Hypokalemia 12/18/2013   Complete tear of right rotator cuff 11/03/2013   Right shoulder pain 06/30/2013   Skin cyst 07/16/2011   Cyst 07/16/2011   HEPATIC CYST 09/12/2009   BACK PAIN, THORACIC REGION, LEFT 03/28/2009   CAD, NATIVE VESSEL 12/26/2008   CHEST PAIN, ATYPICAL 12/02/2008   INTERNAL HEMORRHOIDS 01/09/2008   EXTERNAL HEMORRHOIDS 01/09/2008   LIPOMA NOS 09/16/2007   Hyperlipemia 09/16/2007   Home Medication(s) Prior to Admission medications   Medication Sig Start Date End Date Taking? Authorizing Provider  amLODipine (NORVASC) 10 MG tablet TAKE 1 TABLET BY MOUTH EVERY DAY Patient taking differently: Take 10 mg by mouth daily. 03/01/22  Yes Panosh, Standley Brooking, MD  aspirin 81 MG chewable tablet Chew by mouth daily.   Yes [provider]  Cyanocobalamin (VITAMIN B-12 PO) Take 5,000 mcg by mouth. Liquid   Yes [provider]  gabapentin (NEURONTIN) 100 MG capsule TAKE 2 CAPSULES (200 MG TOTAL) BY MOUTH AT BEDTIME. 09/05/21  Yes Lyndal Pulley, DO  ibuprofen (ADVIL) 200 MG tablet Take 200 mg by mouth every 6 (six) hours as needed.   Yes [provider]  losartan-hydrochlorothiazide (HYZAAR) 100-25 MG  tablet TAKE 1 TABLET BY MOUTH EVERY DAY Patient taking differently: Take 1 tablet by mouth daily. 03/19/22  Yes Panosh, Standley Brooking, MD  MAGNESIUM PO Take by mouth daily.   Yes [provider]  MULTIPLE VITAMIN PO Take 1 capsule by mouth daily.   Yes [provider]  vitamin C (ASCORBIC ACID) 500 MG tablet Take 500 mg by mouth daily.   Yes [provider]  Vitamin D, Ergocalciferol, (DRISDOL) 1.25 MG (50000 UNIT) CAPS capsule TAKE 1 CAPSULE (50,000 UNITS TOTAL) BY MOUTH EVERY 7 (SEVEN)  DAYS 03/27/22  Yes Hulan Saas M, DO  predniSONE (DELTASONE) 50 MG tablet Take 1 pill daily for 5 days 06/19/22   Gregor Hams, MD  rosuvastatin (CRESTOR) 10 MG tablet Take 10 mg by mouth at bedtime. Patient not taking: Reported on 06/25/2022 06/18/22   [provider]                                                                                                                                    Past Surgical History Past Surgical History:  Procedure Laterality Date   ABDOMINAL HYSTERECTOMY     fibroid tumors   BREAST BIOPSY Right    BREAST EXCISIONAL BIOPSY Right    child birth x 5     CHOLECYSTECTOMY     COLONOSCOPY  01/09/2008   SHOULDER ARTHROSCOPY WITH ROTATOR CUFF REPAIR AND SUBACROMIAL DECOMPRESSION Right 11/03/2013   Procedure: RIGHT SHOULDER ARTHROSCOPY WITH DEBRIDEMENT, ROTATOR CUFF REPAIR AND SUBACROMIAL DECOMPRESSION, DISTAL CLAVICLE RESECTION;  Surgeon: Mcarthur Rossetti, MD;  Location: [facility];  Service: Orthopedics;  Laterality: Right;   VESICOVAGINAL FISTULA CLOSURE W/ TAH     for fibroid tumors   Family History Family History  Problem Relation Age of Onset   Breast cancer Sister    Thyroid disease Sister    Hypertension Mother    Colon cancer Brother 73       dx'd about age 78 per pt-    Prostate cancer Brother    Lung cancer Brother    Liver cancer Brother    ALS Brother    Healthy Daughter    Healthy Daughter    Healthy Son    Healthy Son    Healthy Son    Colon polyps Neg Hx    Esophageal cancer Neg Hx    Rectal cancer Neg Hx    Stomach cancer Neg Hx     Social History Social History   Tobacco Use   Smoking status: Never   Smokeless tobacco: Never  Vaping Use   Vaping Use: Never used  Substance Use Topics   Alcohol use: No   Drug use: No   Allergies Lisinopril and Penicillins  Review of Systems Review of Systems  Gastrointestinal:  Positive for abdominal pain, nausea and vomiting.    Physical Exam Vital Signs  I have  reviewed the triage vital signs BP Marland Kitchen)  119/54   Pulse 65   Temp 98.6 F (37 C) (Oral)   Resp 16   Ht '5\' 1"'$  (1.549 m)   Wt 72.6 kg   SpO2 98%   BMI 30.23 kg/m   Physical Exam Vitals and nursing note reviewed.  Constitutional:      General: She is not in acute distress.    Appearance: She is well-developed.  HENT:     Head: Normocephalic and atraumatic.  Eyes:     Conjunctiva/sclera: Conjunctivae normal.  Cardiovascular:     Rate and Rhythm: Normal rate and regular rhythm.     Heart sounds: No murmur heard. Pulmonary:     Effort: Pulmonary effort is normal. No respiratory distress.     Breath sounds: Normal breath sounds.  Abdominal:     Palpations: Abdomen is soft.     Tenderness: There is abdominal tenderness.  Musculoskeletal:        General: No swelling.     Cervical back: Neck supple.  Skin:    General: Skin is warm and dry.     Capillary Refill: Capillary refill takes less than 2 seconds.  Neurological:     Mental Status: She is alert.  Psychiatric:        Mood and Affect: Mood normal.     ED Results and Treatments Labs (all labs ordered are listed, but only abnormal results are displayed) Labs Reviewed - No data to display                                                                                                                        Radiology CT ABDOMEN PELVIS WO CONTRAST  Result Date: 06/25/2022 CLINICAL DATA:  71 year old female swallowed a piece of a plastic fork. EXAM: CT ABDOMEN AND PELVIS WITHOUT CONTRAST TECHNIQUE: Multidetector CT imaging of the abdomen and pelvis was performed following the standard protocol without IV contrast. RADIATION DOSE REDUCTION: This exam was performed according to the departmental dose-optimization program which includes automated exposure control, adjustment of the mA and/or kV according to patient size and/or use of iterative reconstruction technique. COMPARISON:  CT Abdomen and Pelvis 03/19/2009. FINDINGS: Lower  chest: Negative. Decompressed and negative visible distal esophagus. Hepatobiliary: Chronically absent gallbladder and chronic benign hepatic cysts. The largest are circumscribed with simple fluid density and most were present in 2010 (no follow-up imaging recommended). Pancreas: Negative. Spleen: Negative. Adrenals/Urinary Tract: Stable mild chronic dystrophic calcification of the left adrenal which is otherwise normal. Normal right adrenal gland. Noncontrast kidneys appear stable since 2010 and within normal limits. No nephrolithiasis, hydroureter or pararenal inflammation. Unremarkable bladder. Stomach/Bowel: Occasional large bowel diverticula (splenic flexure series 2, image 20). Retained stool in the right and transverse colon. Normal appendix on series 2, image 51. No large bowel inflammation. Terminal ileum appears negative. No dilated small bowel. Stomach and duodenum are decompressed and negative. No small bowel inflammation, dilatation or retained foreign body identified. No free air or free fluid. Vascular/Lymphatic: Aortoiliac calcified atherosclerosis. Normal caliber  abdominal aorta. No lymphadenopathy identified. Reproductive: Absent uterus.  Diminutive or absent ovaries. Other: No pelvic free fluid.  Chronic pelvic phleboliths. Musculoskeletal: No acute osseous abnormality identified. Chronic mild lumbar spondylolisthesis with moderate to severe facet degeneration. IMPRESSION: 1. No retained foreign body identified. No acute or inflammatory process identified in the noncontrast abdomen and pelvis. 2. Aortic Atherosclerosis (ICD10-I70.0). Electronically Signed   By: Genevie Ann M.D.   On: 06/25/2022 04:32   DG Abdomen Acute W/Chest  Result Date: 06/25/2022 CLINICAL DATA:  Possible swallowed foreign body, initial encounter EXAM: DG ABDOMEN ACUTE WITH 1 VIEW CHEST COMPARISON:  11/13/2018 FINDINGS: Cardiac shadow is within normal limits. Lungs are well aerated bilaterally. No bony abnormality is seen.  Scattered large and small bowel gas is noted. No obstructive changes are seen. No abnormal mass or abnormal calcifications are noted. Changes of prior cholecystectomy are seen. Degenerative change of the lumbar spine is noted. No radiopaque foreign body is noted. IMPRESSION: No acute abnormality in the chest and abdomen. No radiopaque foreign body is noted. Electronically Signed   By: Inez Catalina M.D.   On: 06/25/2022 03:25    Pertinent labs & imaging results that were available during my care of the patient were reviewed by me and considered in my medical decision making (see MDM for details).  Medications Ordered in ED Medications - No data to display                                                                                                                                   Procedures Procedures  (including critical care time)  Medical Decision Making / ED Course   This patient presents to the ED for concern of abdominal pain, vomiting and concern for swallowed foreign body, this involves an extensive number of treatment options, and is a complaint that carries with it a high risk of complications and morbidity.  The differential diagnosis includes swallowed foreign body, food poisoning, gastritis, pancreatitis  MDM: Patient seen in the emergency room for evaluation of abdominal pain and concern for swallowed foreign body.  Physical exam unremarkable and patient states that her abdominal pain nausea are gone but she is quite concerned about a swallowed plastic fork piece.  Initial x-ray imaging unremarkable.  Follow-up CT abdomen pelvis unremarkable for retained foreign body.  Given that patient symptoms have completely resolved, patient safe for discharge with outpatient follow-up and return precautions which she voiced understanding.   Additional history obtained: -Additional history obtained from husband -External records from outside source obtained and reviewed including: Chart  review including previous notes, labs, imaging, consultation notes   Lab Tests: -I ordered, reviewed, and interpreted labs.   The pertinent results include:   Labs Reviewed - No data to display     Imaging Studies ordered: I ordered imaging studies including x-ray acute abdominal series, CT abdomen pelvis I independently visualized and interpreted imaging. I agree with the radiologist  interpretation   Medicines ordered and prescription drug management: No orders of the defined types were placed in this encounter.   -I have reviewed the patients home medicines and have made adjustments as needed  Critical interventions none    Cardiac Monitoring: The patient was maintained on a cardiac monitor.  I personally viewed and interpreted the cardiac monitored which showed an underlying rhythm of: NSR  Social Determinants of Health:  Factors impacting patients care include: none   Reevaluation: After the interventions noted above, I reevaluated the patient and found that they have :improved  Co morbidities that complicate the patient evaluation  Past Medical History:  Diagnosis Date   Allergy    occ takes otc allergy pill   CAD (coronary artery disease)    a. 11/2005 Cath: nonobs dzs, 30% LAD lesion on cath ;  b. 11/2005 Echo: NL EF;  c. 04/2012 Echo: EF 55-60%, Gr 2 DD;  d. 04/2012 Ex MV: EF 72%, ST depression in recovery with NL perfusion imaging - HTN response to exercise.   Chest pressure "fatigue" 05/08/2012   GERD (gastroesophageal reflux disease)    Hepatic cyst    on ct [place]   HLD (hyperlipidemia)    HTN (hypertension)    Midsternal chest pain    cardiology eval with non-ischemic stress test 04/2012   Upper airway cough syndrome 04/29/2015   Trial of max gerd rx 04/29/2015 >>>        Dispostion: I considered admission for this patient, but with negative imaging and symptoms resolved, patient safe for discharge with outpatient follow-up     Final Clinical  Impression(s) / ED Diagnoses Final diagnoses:  Vomiting without nausea, unspecified vomiting type     '@PCDICTATION'$ @    Teressa Lower, MD 06/25/22 [phone]

## 2022-06-27 ENCOUNTER — Other Ambulatory Visit: Payer: Self-pay | Admitting: Family Medicine

## 2022-06-27 ENCOUNTER — Other Ambulatory Visit: Payer: Self-pay | Admitting: Internal Medicine

## 2022-06-28 DIAGNOSIS — K5900 Constipation, unspecified: Secondary | ICD-10-CM | POA: Diagnosis not present

## 2022-06-28 DIAGNOSIS — R1084 Generalized abdominal pain: Secondary | ICD-10-CM | POA: Diagnosis not present

## 2022-06-28 DIAGNOSIS — R112 Nausea with vomiting, unspecified: Secondary | ICD-10-CM | POA: Diagnosis not present

## 2022-07-03 DIAGNOSIS — D480 Neoplasm of uncertain behavior of bone and articular cartilage: Secondary | ICD-10-CM | POA: Diagnosis not present

## 2022-07-03 DIAGNOSIS — Z9889 Other specified postprocedural states: Secondary | ICD-10-CM | POA: Diagnosis not present

## 2022-07-10 ENCOUNTER — Ambulatory Visit (INDEPENDENT_AMBULATORY_CARE_PROVIDER_SITE_OTHER): Payer: Medicare Other | Admitting: Internal Medicine

## 2022-07-10 ENCOUNTER — Encounter: Payer: Self-pay | Admitting: Internal Medicine

## 2022-07-10 VITALS — BP 156/84 | HR 73 | Temp 98.2°F | Ht 61.0 in | Wt 160.6 lb

## 2022-07-10 DIAGNOSIS — R935 Abnormal findings on diagnostic imaging of other abdominal regions, including retroperitoneum: Secondary | ICD-10-CM

## 2022-07-10 DIAGNOSIS — I10 Essential (primary) hypertension: Secondary | ICD-10-CM | POA: Diagnosis not present

## 2022-07-10 DIAGNOSIS — R194 Change in bowel habit: Secondary | ICD-10-CM | POA: Diagnosis not present

## 2022-07-10 NOTE — Patient Instructions (Addendum)
Take Miralax as needed.   Will get  Dr stark team to review  scan report to see if need a fu  evaluation.   Presuming your bp is up today for stress.   Make sure coming down .

## 2022-07-10 NOTE — Progress Notes (Signed)
Chief Complaint  Patient presents with   Hospitalization Follow-up    HPI: Kaylee Adams 71 y.o. come in for fu after ed visit 7 6 for abd pain /  new onset constipation  .   Miralax idi help.  Not  wlaking as much recently .   Ct  of abd  Calcium 10.8 ( nl pth in past for boerderline elevated calcium) Had vomiting episode   after  poss ate part of plastic fork   ct no acute findings .  About 10 minutes    and  inability to evacuate    since then  :miralax has been helpful  but all was distressing  No obv .  Scan in ed had some ? Of attenuation at pancreatic head  vs  post choly changes  Feels BP is up from stress today  but has been ok   Has seen [facility] for [facility] prob lipoma  can follow  if enlarging benign process prn fu.  ROS: See pertinent positives and negatives per HPI. Feels tired of all the   Past Medical History:  Diagnosis Date   Allergy    occ takes otc allergy pill   CAD (coronary artery disease)    a. 11/2005 Cath: nonobs dzs, 30% LAD lesion on cath ;  b. 11/2005 Echo: NL EF;  c. 04/2012 Echo: EF 55-60%, Gr 2 DD;  d. 04/2012 Ex MV: EF 72%, ST depression in recovery with NL perfusion imaging - HTN response to exercise.   Chest pressure "fatigue" 05/08/2012   GERD (gastroesophageal reflux disease)    Hepatic cyst    on ct [place]   HLD (hyperlipidemia)    HTN (hypertension)    Midsternal chest pain    cardiology eval with non-ischemic stress test 04/2012   Upper airway cough syndrome 04/29/2015   Trial of max gerd rx 04/29/2015 >>>      Family History  Problem Relation Age of Onset   Breast cancer Sister    Thyroid disease Sister    Hypertension Mother    Colon cancer Brother 81       dx'd about age 8 per pt-    Prostate cancer Brother    Lung cancer Brother    Liver cancer Brother    ALS Brother    Healthy Daughter    Healthy Daughter    Healthy Son    Healthy Son    Healthy Son    Colon polyps Neg Hx    Esophageal cancer Neg Hx    Rectal cancer Neg Hx     Stomach cancer Neg Hx     Social History   Socioeconomic History   Marital status: Married    Spouse name: Not on file   Number of children: 5   Years of education: Not on file   Highest education level: Not on file  Occupational History    Employer: [employer]  Tobacco Use   Smoking status: Never   Smokeless tobacco: Never  Vaping Use   Vaping Use: Never used  Substance and Sexual Activity   Alcohol use: No   Drug use: No   Sexual activity: Not on file  Other Topics Concern   Not on file  Social History Narrative   Married, full time Network engineer. 3 pets    [place]   6-8 hours sleep    Right Handed   Lives in a two story home.    Drinks 1 cup of Coffee  Social Determinants of Health   Financial Resource Strain: Low Risk  (08/16/2021)   Overall Financial Resource Strain (CARDIA)    Difficulty of Paying Living Expenses: Not hard at all  Food Insecurity: No Food Insecurity (08/02/2020)   Hunger Vital Sign    Worried About Running Out of Food in the Last Year: Never true    Ran Out of Food in the Last Year: Never true  Transportation Needs: No Transportation Needs (08/16/2021)   PRAPARE - Hydrologist (Medical): No    Lack of Transportation (Non-Medical): No  Physical Activity: Insufficiently Active (08/02/2020)   Exercise Vital Sign    Days of Exercise per Week: 3 days    Minutes of Exercise per Session: 30 min  Stress: Stress Concern Present (08/02/2020)   [place]    Feeling of Stress : To some extent  Social Connections: Moderately Integrated (08/02/2020)   Social Connection and Isolation Panel [NHANES]    Frequency of Communication with Friends and Family: More than three times a week    Frequency of Social Gatherings with Friends and Family: Three times a week    Attends Religious Services: Never    Active Member of Clubs or Organizations: Yes    Attends Arts development officer: More than 4 times per year    Marital Status: Married    Outpatient Medications Prior to Visit  Medication Sig Dispense Refill   amLODipine (NORVASC) 10 MG tablet TAKE 1 TABLET BY MOUTH EVERY DAY 90 tablet 1   aspirin 81 MG chewable tablet Chew by mouth daily.     Cyanocobalamin (VITAMIN B-12 PO) Take 5,000 mcg by mouth. Liquid     gabapentin (NEURONTIN) 100 MG capsule TAKE 2 CAPSULES (200 MG TOTAL) BY MOUTH AT BEDTIME. 180 capsule 3   ibuprofen (ADVIL) 200 MG tablet Take 200 mg by mouth every 6 (six) hours as needed.     losartan-hydrochlorothiazide (HYZAAR) 100-25 MG tablet TAKE 1 TABLET BY MOUTH EVERY DAY 90 tablet 0   MAGNESIUM PO Take by mouth daily.     MULTIPLE VITAMIN PO Take 1 capsule by mouth daily.     predniSONE (DELTASONE) 50 MG tablet Take 1 pill daily for 5 days 5 tablet 0   rosuvastatin (CRESTOR) 10 MG tablet Take 10 mg by mouth at bedtime.     vitamin C (ASCORBIC ACID) 500 MG tablet Take 500 mg by mouth daily.     Vitamin D, Ergocalciferol, (DRISDOL) 1.25 MG (50000 UNIT) CAPS capsule TAKE 1 CAPSULE (50,000 UNITS TOTAL) BY MOUTH EVERY 7 (SEVEN) DAYS 12 capsule 0   No facility-administered medications prior to visit.     EXAM:  BP (!) 156/84   Pulse 73   Temp 98.2 F (36.8 C) (Oral)   Ht '5\' 1"'$  (1.549 m)   Wt 160 lb 9.6 oz (72.8 kg)   SpO2 98%   BMI 30.35 kg/m   Body mass index is 30.35 kg/m.  GENERAL: vitals reviewed and listed above, alert, oriented, appears well hydrated and in no acute distress HEENT: atraumatic, conjunctiva  clear, no obvious abnormalities on inspection of external nose and ears  NECK: no obvious masses on inspection palpation  LUNGS: clear to auscultation bilaterally, no wheezes, rales or rhonchi, good air movement CV: HRRR, no clubbing cyanosis or  peripheral edema nl cap refill  Abdomen:  Sof,t normal bowel sounds without hepatosplenomegaly, no guarding rebound or masses no CVA tenderness  MS: moves all  extremities without noticeable focal  abnormality PSYCH: pleasant and cooperative, no obvious depression or anxiety Lab Results  Component Value Date   WBC 5.4 05/01/2022   HGB 13.5 05/01/2022   HCT 40.8 05/01/2022   PLT 305.0 05/01/2022   GLUCOSE 88 05/01/2022   CHOL 227 (H) 05/01/2022   TRIG 90.0 05/01/2022   HDL 59.30 05/01/2022   LDLDIRECT 153.2 06/23/2012   LDLCALC 150 (H) 05/01/2022   ALT 18 05/01/2022   AST 19 05/01/2022   NA 140 05/01/2022   K 3.5 05/01/2022   CL 104 05/01/2022   CREATININE 0.78 05/01/2022   BUN 18 05/01/2022   CO2 29 05/01/2022   TSH 0.64 05/01/2022   INR 0.9 11/29/2008   HGBA1C 5.6 05/01/2022   BP Readings from Last 3 Encounters:  07/10/22 (!) 156/84  06/25/22 134/82  06/19/22 (!) 150/72     ASSESSMENT AND PLAN:  Discussed the following assessment and plan:  Change in bowel habit - after vomiting episode  and concern about FB  ingestion getting better   Abnormal CT of the abdomen - ?pancreatic  head attenuation nl vs other rad advised ;patient exhausted from so much testing ;since doing ok will ask Dr Fuller Plan to review opine as to fu needed   Essential hypertension - up today has been controlled  -Patient advised to return or notify health care team  if  new concerns arise.  Patient Instructions  Take Miralax as needed.   Will get  Dr stark team to review  scan report to see if need a fu  evaluation.   Presuming your bp is up today for stress.   Make sure coming down .     Standley Brooking. Julionna Marczak M.D. Ct [facility]  LOWER CHEST: . Mediastinum: Within normal limits.  . Heart/vessel: Normal heart size. No pericardial effusion.  . Lungs/pleura: Bibasilar subsegmental atelectasis. No pleural effusions.  ABDOMEN/PELVIS: . Liver: Multiple simple cysts. The largest measures 3.7 cm. Subcentimeter hypodensities are too small to characterize, but are also favored to represent benign cysts. . Gallbladder/biliary: Cholecystectomy. There is  mild intrahepatic biliary distention and distention of the CBD up to 9.5 mm. The CBD tapers without obvious filling defect. At the distal tip of the CBD there is a subtle 8 x 12 mm area of decreased attenuation within the pancreatic head. Marland Kitchen Spleen: Within normal limits. . Pancreas: Mild prominence of the pancreatic duct measuring up to 4 mm terminating at the subtle 8 x 12 mm area of decreased attenuation at the pancreatic head. The pancreas is otherwise unremarkable. . Adrenals: Within normal limits. . Kidneys: Left peripelvic renal cysts suspected. No evidence for hydronephrosis or nephrolithiasis. . Peritoneum/mesentery/extraperitoneum: Within normal limits. . GI tract: Diverticulosis without diverticulitis. Normal appendix. No obstruction. . Ureters: Within normal limits. . Bladder: Within normal limits. . Reproductive system: Within normal limits. No adnexal masses. . Vascular: Moderate mixed calcific/noncalcific aortoiliac atherosclerosis. No aneurysm.  MSK: No acute osseous findings. Mild retrolisthesis at L1-L2. Grade 1 anterolisthesis at L3-4 and L4-L5. Asymmetric atrophy of the right paraspinal musculature.  CONCLUSION:   1. Cholecystectomy. Mild biliary and pancreatic distention may be postoperative in nature. There are slightly greater than expected and are associated with a subtle area of decreased attenuation within the pancreatic head. MRCP suggested if not known to be stable or previously evaluated.  2. Incidental findings described above.   Imaging Results - CT ABDOMEN PELVIS W CONTRAST (ROUTINE) (06/28/2022 9:40 AM  EDT) Authorizing Provider Result Type

## 2022-07-11 NOTE — Progress Notes (Signed)
[facility] CT AP 06/28/2022 1. Cholecystectomy. Mild biliary and pancreatic distention may be postoperative in nature. There are slightly greater than expected and are associated with a subtle area of decreased attenuation within the pancreatic head. MRCP suggested if not known to be stable or previously evaluated.  2. Incidental findings described above.  Agree with abdominal MRI/MRCP to further evaluate.

## 2022-08-06 ENCOUNTER — Telehealth: Payer: Self-pay | Admitting: Pharmacist

## 2022-08-06 NOTE — Chronic Care Management (AMB) (Signed)
Chronic Care Management Pharmacy Assistant   Name: Kaylee Adams  MRN: [id number] DOB: [birth year]  Reason for Encounter: Disease State   Conditions to be addressed/monitored: HTN  Recent office visits:  07/10/22 Burnis Medin, MD - Patient presented for Change in bowel habit and other concerns. No medication changes.  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 No medication changes.  06/19/22 Gregor Hams, MD (Sports Med) - Patient presented for Cervical radiculopathy and other concerns. Prescribed Prednisone 50 mg.  Hospital visits:  Medication Reconciliation was completed by comparing discharge summary, patient's EMR and Pharmacy list, and upon discussion with patient.  Patient presented to [facility] on 06/28/22 due to Abdominal Pain. Discharge date was 06/28/22.  New?Medications Started at [facility] Discharge:?? Recommended Mirilax   Medication Changes at Hospital Discharge: -Changed  none  Medications Discontinued at Hospital Discharge: -Stopped  none  Medications that remain the same after Hospital Discharge:??  -All other medications will remain the same.     Hospital visits:  Medication Reconciliation was completed by comparing discharge summary, patient's EMR and Pharmacy list, and upon discussion with patient.  Patient presented to [facility] on 06/25/22 due to Vomiting without nausea unspecified type. Discharge date was 7/323.  New?Medications Started at [facility] Discharge:?? none  Medication Changes at Hospital Discharge: -Changed  none  Medications Discontinued at Hospital Discharge: -Stopped  none  Medications that remain the same after Hospital Discharge:??  -All other medications will remain the same.      Medications: Outpatient Encounter Medications as of 08/06/2022  Medication Sig Note    amLODipine (NORVASC) 10 MG tablet TAKE 1 TABLET BY MOUTH EVERY DAY    aspirin 81 MG chewable tablet Chew by mouth daily.    Cyanocobalamin (VITAMIN B-12 PO) Take 5,000 mcg by mouth. Liquid    gabapentin (NEURONTIN) 100 MG capsule TAKE 2 CAPSULES (200 MG TOTAL) BY MOUTH AT BEDTIME.    ibuprofen (ADVIL) 200 MG tablet Take 200 mg by mouth every 6 (six) hours as needed.    losartan-hydrochlorothiazide (HYZAAR) 100-25 MG tablet TAKE 1 TABLET BY MOUTH EVERY DAY    MAGNESIUM PO Take by mouth daily.    MULTIPLE VITAMIN PO Take 1 capsule by mouth daily.    predniSONE (DELTASONE) 50 MG tablet Take 1 pill daily for 5 days    rosuvastatin (CRESTOR) 10 MG tablet Take 10 mg by mouth at bedtime. 06/25/2022: Pt states she does not take this but last dispensed what june   vitamin C (ASCORBIC ACID) 500 MG tablet Take 500 mg by mouth daily.    Vitamin D, Ergocalciferol, (DRISDOL) 1.25 MG (50000 UNIT) CAPS capsule TAKE 1 CAPSULE (50,000 UNITS TOTAL) BY MOUTH EVERY 7 (SEVEN) DAYS    No facility-administered encounter medications on file as of 08/06/2022.   Reviewed chart prior to disease state call. Spoke with patient regarding BP  Recent Office Vitals: BP Readings from Last 3 Encounters:  07/10/22 (!) 156/84  06/25/22 134/82  06/19/22 (!) 150/72   Pulse Readings from Last 3 Encounters:  07/10/22 73  06/25/22 68  06/19/22 81    Wt Readings from Last 3 Encounters:  07/10/22 160 lb 9.6 oz (72.8 kg)  06/25/22 160 lb (72.6 kg)  06/19/22 159 lb 9.6 oz (72.4 kg)     Kidney Function Lab Results  Component Value Date/Time   CREATININE 0.78 05/01/2022 07:21  AM   CREATININE 0.82 05/25/2021 07:57 AM   CREATININE 0.69 07/29/2020 09:15 AM   CREATININE 0.77 12/10/2013 09:13 AM   GFR 77.02 05/01/2022 07:21 AM   GFRNONAA 54 (L) 10/29/2013 08:41 AM   GFRAA 63 (L) 10/29/2013 08:41 AM       Latest Ref Rng & Units 05/01/2022    7:21 AM 05/25/2021    7:57 AM 02/27/2021   10:36 AM  BMP  Glucose 70 - 99 mg/dL 88  82   83   BUN 6 - 23 mg/dL '18  20  18   '$ Creatinine 0.40 - 1.20 mg/dL 0.78  0.82  0.75   Sodium 135 - 145 mEq/L 140  143  141   Potassium 3.5 - 5.1 mEq/L 3.5  4.1  3.6   Chloride 96 - 112 mEq/L 104  104  106   CO2 19 - 32 mEq/L '29  25  29   '$ Calcium 8.4 - 10.5 mg/dL 10.2  10.9  10.2     Current antihypertensive regimen:  Amlodipine 10 mg 1 tablet daily Losartan -Hydrochlorothiazide 100-25 mg 1 tablet daily How often are you checking your Blood Pressure? Patient reports she has been checking and it has not been high, no complaint of any hyper/hypotensive symptoms. Current home BP readings: Patient reports she was unsure and could not provide past or current readings at this time What recent interventions/DTPs have been made by any provider to improve Blood Pressure control since last CPP Visit: Patient reports none she states she has "followed up with PCP after Urgent care visits and she is good notes are in her chart and she is not stressing over anything." She reports no concerns or questions at this time. Any recent hospitalizations or ED visits since last visit with CPP? Yes Patient reports she is not interested in follow up with Cpp in the Future.  Adherence Review: Is the patient currently on ACE/ARB medication? Yes Does the patient have >5 day gap between last estimated fill dates? No    Care Gaps: Flu Vaccine - Overdue CCM-  Declined BP- 156/84 01/10/22 AWV-8/21 per chart notes pt declined office aware to sched  Star Rating Drugs: Losartan- HCTZ- 100-25 mg - Last filled 06/27/22 90 DS at [facility] Rosuvastatin (Cerestor) 10 mg - Last filled 06/18/22 90 DS at [place] Pharmacist Assistant [phone]

## 2022-08-07 NOTE — Progress Notes (Unsigned)
[place] Phone: [phone] Subjective:   Kaylee Adams, am serving as a scribe for Dr. Hulan Saas.  I'm seeing this patient by the request  of:  Panosh, Standley Brooking, MD  CC: Left shoulder pain follow-up  KCL:[id number]  04/30/2022 Patient still has a partial tear noted of the subscapularis and the supraspinatus but overall not anything significant.  We discussed icing regimen and home exercises, which activities to doing twice to avoid, increase activity slowly.  I believe with patient having improvement in the strength already at this time she should do well with conservative therapy and can follow-up with me as needed.  Did see Dr. Georgina Snell 06/19/2022.  Updated 08/08/2022 Kaylee Adams is a 71 y.o. female coming in with complaint of left shoulder pain. Patient states that she is unable to sleep through the night due to pain that is ongoing for past few months. Numbness going down her arm into her hand. Taking IBU prn.   Xray IMPRESSION: 1. Adams acute fracture or dislocation. 2. 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     Past Medical History:  Diagnosis Date   Allergy    occ takes otc allergy pill   CAD (coronary artery disease)    a. 11/2005 Cath: nonobs dzs, 30% LAD lesion on cath ;  b. 11/2005 Echo: NL EF;  c. 04/2012 Echo: EF 55-60%, Gr 2 DD;  d. 04/2012 Ex MV: EF 72%, ST depression in recovery with NL perfusion imaging - HTN response to exercise.   Chest pressure "fatigue" 05/08/2012   GERD (gastroesophageal reflux disease)    Hepatic cyst    on ct [place]   HLD (hyperlipidemia)    HTN (hypertension)    Midsternal chest pain    cardiology eval with non-ischemic stress test 04/2012   Upper airway cough syndrome 04/29/2015   Trial of max gerd rx 04/29/2015 >>>     Past Surgical History:  Procedure Laterality Date   ABDOMINAL HYSTERECTOMY     fibroid tumors   BREAST BIOPSY Right     BREAST EXCISIONAL BIOPSY Right    child birth x 5     CHOLECYSTECTOMY     COLONOSCOPY  01/09/2008   SHOULDER ARTHROSCOPY WITH ROTATOR CUFF REPAIR AND SUBACROMIAL DECOMPRESSION Right 11/03/2013   Procedure: RIGHT SHOULDER ARTHROSCOPY WITH DEBRIDEMENT, ROTATOR CUFF REPAIR AND SUBACROMIAL DECOMPRESSION, DISTAL CLAVICLE RESECTION;  Surgeon: Mcarthur Rossetti, MD;  Location: [facility];  Service: Orthopedics;  Laterality: Right;   VESICOVAGINAL FISTULA CLOSURE W/ TAH     for fibroid tumors   Social History   Socioeconomic History   Marital status: Married    Spouse name: Not on file   Number of children: 5   Years of education: Not on file   Highest education level: Not on file  Occupational History    Employer: [employer]  Tobacco Use   Smoking status: Never   Smokeless tobacco: Never  Vaping Use   Vaping Use: Never used  Substance and Sexual Activity   Alcohol use: Adams   Drug use: Adams   Sexual activity: Not on file  Other Topics Concern   Not on file  Social History Narrative   Married, full time Network engineer. 3 pets    [place]   6-8 hours sleep    Right Handed   Lives in a two story home.    Drinks 1 cup of Coffee   Social Determinants  of Health   Financial Resource Strain: Low Risk  (08/16/2021)   Overall Financial Resource Strain (CARDIA)    Difficulty of Paying Living Expenses: Not hard at all  Food Insecurity: Adams Food Insecurity (08/02/2020)   Hunger Vital Sign    Worried About Running Out of Food in the Last Year: Never true    Ran Out of Food in the Last Year: Never true  Transportation Needs: Adams Transportation Needs (08/16/2021)   PRAPARE - Hydrologist (Medical): Adams    Lack of Transportation (Non-Medical): Adams  Physical Activity: Insufficiently Active (08/02/2020)   Exercise Vital Sign    Days of Exercise per Week: 3 days    Minutes of Exercise per Session: 30 min  Stress: Stress Concern Present (08/02/2020)   [place]    Feeling of Stress : To some extent  Social Connections: Moderately Integrated (08/02/2020)   Social Connection and Isolation Panel [NHANES]    Frequency of Communication with Friends and Family: More than three times a week    Frequency of Social Gatherings with Friends and Family: Three times a week    Attends Religious Services: Never    Active Member of Clubs or Organizations: Yes    Attends Music therapist: More than 4 times per year    Marital Status: Married   Allergies  Allergen Reactions   Lisinopril     REACTION: cough   Penicillins Nausea Only   Family History  Problem Relation Age of Onset   Breast cancer Sister    Thyroid disease Sister    Hypertension Mother    Colon cancer Brother 36       dx'd about age 23 per pt-    Prostate cancer Brother    Lung cancer Brother    Liver cancer Brother    ALS Brother    Healthy Daughter    Healthy Daughter    Healthy Son    Healthy Son    Healthy Son    Colon polyps Neg Hx    Esophageal cancer Neg Hx    Rectal cancer Neg Hx    Stomach cancer Neg Hx     Current Outpatient Medications (Endocrine & Metabolic):    predniSONE (DELTASONE) 50 MG tablet, Take 1 pill daily for 5 days  Current Outpatient Medications (Cardiovascular):    amLODipine (NORVASC) 10 MG tablet, TAKE 1 TABLET BY MOUTH EVERY DAY   losartan-hydrochlorothiazide (HYZAAR) 100-25 MG tablet, TAKE 1 TABLET BY MOUTH EVERY DAY   rosuvastatin (CRESTOR) 10 MG tablet, Take 10 mg by mouth at bedtime.   Current Outpatient Medications (Analgesics):    aspirin 81 MG chewable tablet, Chew by mouth daily.   ibuprofen (ADVIL) 200 MG tablet, Take 200 mg by mouth every 6 (six) hours as needed.  Current Outpatient Medications (Hematological):    Cyanocobalamin (VITAMIN B-12 PO), Take 5,000 mcg by mouth. Liquid  Current Outpatient Medications (Other):    gabapentin (NEURONTIN) 100 MG capsule,  TAKE 2 CAPSULES (200 MG TOTAL) BY MOUTH AT BEDTIME.   MAGNESIUM PO, Take by mouth daily.   MULTIPLE VITAMIN PO, Take 1 capsule by mouth daily.   vitamin C (ASCORBIC ACID) 500 MG tablet, Take 500 mg by mouth daily.   Vitamin D, Ergocalciferol, (DRISDOL) 1.25 MG (50000 UNIT) CAPS capsule, TAKE 1 CAPSULE (50,000 UNITS TOTAL) BY MOUTH EVERY 7 (SEVEN) DAYS   Reviewed prior external information including notes and imaging from  primary care provider As well as notes that were available from care everywhere and other healthcare systems.  Past medical history, social, surgical and family history all reviewed in electronic medical record.  Adams pertanent information unless stated regarding to the chief complaint.   Review of Systems:  Adams headache, visual changes, nausea, vomiting, diarrhea, constipation, dizziness, abdominal pain, skin rash, fevers, chills, night sweats, weight loss, swollen lymph nodes, body aches, joint swelling, chest pain, shortness of breath, mood changes. POSITIVE muscle aches  Objective  Blood pressure 108/62, pulse 75, height '5\' 1"'$  (1.549 m), weight 162 lb (73.5 kg), SpO2 99 %.   General: Adams apparent distress alert and oriented x3 mood and affect normal, dressed appropriately.  HEENT: Pupils equal, extraocular movements intact  Respiratory: Patient's speak in full sentences and does not appear short of breath  Cardiovascular: Adams lower extremity edema, non tender, Adams erythema  Left shoulder does have some tenderness to palpation noted.  Seems to be over the acromioclavicular joint as well as over the anterior aspect of the joint.  Positive impingement noted.  Procedure: Real-time Ultrasound Guided Injection of left acromioclavicular joint Device: GE Logiq Q7 Ultrasound guided injection is preferred based studies that show increased duration, increased effect, greater accuracy, decreased procedural pain, increased response rate, and decreased cost with ultrasound guided versus  blind injection.  Verbal informed consent obtained.  Time-out conducted.  Noted Adams overlying erythema, induration, or other signs of local infection.  Skin prepped in a sterile fashion.  Local anesthesia: Topical Ethyl chloride.  With sterile technique and under real time ultrasound guidance: With a 25-gauge half inch needle injecting 0.5 cc of 0.5% Marcaine and 0.5 cc of Kenalog 40 mg. Completed without difficulty  Pain immediately resolved suggesting accurate placement of the medication.  Advised to call if fevers/chills, erythema, induration, drainage, or persistent bleeding.  Impression: Technically successful ultrasound guided injection.  Procedure: Real-time Ultrasound Guided Injection of left glenohumeral joint Device: GE Logiq E  Ultrasound guided injection is preferred based studies that show increased duration, increased effect, greater accuracy, decreased procedural pain, increased response rate with ultrasound guided versus blind injection.  Verbal informed consent obtained.  Time-out conducted.  Noted Adams overlying erythema, induration, or other signs of local infection.  Skin prepped in a sterile fashion.  Local anesthesia: Topical Ethyl chloride.  With sterile technique and under real time ultrasound guidance:  Joint visualized.  21g 2 inch needle inserted posterior approach. Pictures taken for needle placement. Patient did have injection of 2 cc of 0.5% Marcaine, and 1cc of Kenalog 40 mg/dL. Completed without difficulty  Advised to call if fevers/chills, erythema, induration, drainage, or persistent bleeding.  Images permanently stored and available for review in the ultrasound unit.  Impression: Technically successful ultrasound guided injection.    Impression and Recommendations:     The above documentation has been reviewed and is accurate and complete Lyndal Pulley, DO

## 2022-08-08 ENCOUNTER — Ambulatory Visit (INDEPENDENT_AMBULATORY_CARE_PROVIDER_SITE_OTHER): Payer: Medicare Other | Admitting: Family Medicine

## 2022-08-08 ENCOUNTER — Encounter: Payer: Self-pay | Admitting: Family Medicine

## 2022-08-08 ENCOUNTER — Ambulatory Visit: Payer: Self-pay

## 2022-08-08 VITALS — BP 108/62 | HR 75 | Ht 61.0 in | Wt 162.0 lb

## 2022-08-08 DIAGNOSIS — M75112 Incomplete rotator cuff tear or rupture of left shoulder, not specified as traumatic: Secondary | ICD-10-CM

## 2022-08-08 DIAGNOSIS — M25512 Pain in left shoulder: Secondary | ICD-10-CM

## 2022-08-08 DIAGNOSIS — G8929 Other chronic pain: Secondary | ICD-10-CM | POA: Diagnosis not present

## 2022-08-08 DIAGNOSIS — M25519 Pain in unspecified shoulder: Secondary | ICD-10-CM | POA: Insufficient documentation

## 2022-08-08 NOTE — Patient Instructions (Signed)
Good to see you  Injected the shoulder today  Ice 20 minutes 2 times daily. Usually after activity and before bed.. Exercises 3 times a week.  See me again in 7-8 weeks to make sure you are doing good

## 2022-08-08 NOTE — Assessment & Plan Note (Signed)
Patient is found to have effusion noted today.  Given injection today and tolerated the procedure well, discussed icing regimen and home exercises.  Increase activity slowly follow-up again in 6 to 8 weeks

## 2022-08-08 NOTE — Assessment & Plan Note (Signed)
Patient given injection because patient continued to have some discomfort and pain.  Over-the-counter patient has been continued well.  Discussed which activities to do and which ones to avoid.  Increase activity slowly.  If continuing to have worsening pain I do feel advanced imaging will be warranted.  Follow-up again in 6 to 8 weeks

## 2022-09-17 DIAGNOSIS — M25552 Pain in left hip: Secondary | ICD-10-CM | POA: Diagnosis not present

## 2022-09-17 DIAGNOSIS — Z23 Encounter for immunization: Secondary | ICD-10-CM | POA: Diagnosis not present

## 2022-09-24 ENCOUNTER — Other Ambulatory Visit: Payer: Self-pay | Admitting: Internal Medicine

## 2022-09-28 ENCOUNTER — Other Ambulatory Visit: Payer: Self-pay

## 2022-09-28 ENCOUNTER — Other Ambulatory Visit: Payer: Self-pay | Admitting: Family Medicine

## 2022-09-28 MED ORDER — VITAMIN D (ERGOCALCIFEROL) 1.25 MG (50000 UNIT) PO CAPS: 50000.0000 [IU] | ORAL_CAPSULE | ORAL | 0 refills | Status: AC

## 2022-10-01 NOTE — Progress Notes (Unsigned)
[place] Phone: [phone] Subjective:   Kaylee Adams, am serving as a scribe for Dr. Hulan Saas.  I'm seeing this patient by the request  of:  Panosh, Standley Brooking, MD  CC: Left shoulder pain,  IOE:[id number]  08/08/2022 Patient is found to have effusion noted today.  Given injection today and tolerated the procedure well, discussed icing regimen and home exercises.  Increase activity slowly follow-up again in 6 to 8 weeks  Patient given injection because patient continued to have some discomfort and pain.  Over-the-counter patient has been continued well.  Discussed which activities to do and which ones to avoid.  Increase activity slowly.  If continuing to have worsening pain I do feel advanced imaging will be warranted.  Follow-up again in 6 to 8 weeks  Update 10/02/2022 Kaylee Adams is a 71 y.o. female coming in with complaint of L shoulder pain. Patient states that she had one week of relief from the shoulder injection. Continued pain with most shoulder motions.   Also having pain in knee and hip on L side. Unable to walk due to hip pain. Pain over lateral aspect of both joints.      Past Medical History:  Diagnosis Date   Allergy    occ takes otc allergy pill   CAD (coronary artery disease)    a. 11/2005 Cath: nonobs dzs, 30% LAD lesion on cath ;  b. 11/2005 Echo: NL EF;  c. 04/2012 Echo: EF 55-60%, Gr 2 DD;  d. 04/2012 Ex MV: EF 72%, ST depression in recovery with NL perfusion imaging - HTN response to exercise.   Chest pressure "fatigue" 05/08/2012   GERD (gastroesophageal reflux disease)    Hepatic cyst    on ct [place]   HLD (hyperlipidemia)    HTN (hypertension)    Midsternal chest pain    cardiology eval with non-ischemic stress test 04/2012   Upper airway cough syndrome 04/29/2015   Trial of max gerd rx 04/29/2015 >>>     Past Surgical History:  Procedure Laterality Date   ABDOMINAL HYSTERECTOMY      fibroid tumors   BREAST BIOPSY Right    BREAST EXCISIONAL BIOPSY Right    child birth x 5     CHOLECYSTECTOMY     COLONOSCOPY  01/09/2008   SHOULDER ARTHROSCOPY WITH ROTATOR CUFF REPAIR AND SUBACROMIAL DECOMPRESSION Right 11/03/2013   Procedure: RIGHT SHOULDER ARTHROSCOPY WITH DEBRIDEMENT, ROTATOR CUFF REPAIR AND SUBACROMIAL DECOMPRESSION, DISTAL CLAVICLE RESECTION;  Surgeon: Mcarthur Rossetti, MD;  Location: [facility];  Service: Orthopedics;  Laterality: Right;   VESICOVAGINAL FISTULA CLOSURE W/ TAH     for fibroid tumors   Social History   Socioeconomic History   Marital status: Married    Spouse name: Not on file   Number of children: 5   Years of education: Not on file   Highest education level: Not on file  Occupational History    Employer: [employer]  Tobacco Use   Smoking status: Never   Smokeless tobacco: Never  Vaping Use   Vaping Use: Never used  Substance and Sexual Activity   Alcohol use: Adams   Drug use: Adams   Sexual activity: Not on file  Other Topics Concern   Not on file  Social History Narrative   Married, full time Network engineer. 3 pets    [place]   6-8 hours sleep    Right Handed   Lives in  a two story home.    Drinks 1 cup of Coffee   Social Determinants of Health   Financial Resource Strain: Low Risk  (08/16/2021)   Overall Financial Resource Strain (CARDIA)    Difficulty of Paying Living Expenses: Not hard at all  Food Insecurity: Adams Food Insecurity (08/02/2020)   Hunger Vital Sign    Worried About Running Out of Food in the Last Year: Never true    Ran Out of Food in the Last Year: Never true  Transportation Needs: Adams Transportation Needs (08/16/2021)   PRAPARE - Hydrologist (Medical): Adams    Lack of Transportation (Non-Medical): Adams  Physical Activity: Insufficiently Active (08/02/2020)   Exercise Vital Sign    Days of Exercise per Week: 3 days    Minutes of Exercise per Session: 30 min  Stress: Stress  Concern Present (08/02/2020)   [place]    Feeling of Stress : To some extent  Social Connections: Moderately Integrated (08/02/2020)   Social Connection and Isolation Panel [NHANES]    Frequency of Communication with Friends and Family: More than three times a week    Frequency of Social Gatherings with Friends and Family: Three times a week    Attends Religious Services: Never    Active Member of Clubs or Organizations: Yes    Attends Music therapist: More than 4 times per year    Marital Status: Married   Allergies  Allergen Reactions   Lisinopril     REACTION: cough   Penicillins Nausea Only   Family History  Problem Relation Age of Onset   Breast cancer Sister    Thyroid disease Sister    Hypertension Mother    Colon cancer Brother 45       dx'd about age 69 per pt-    Prostate cancer Brother    Lung cancer Brother    Liver cancer Brother    ALS Brother    Healthy Daughter    Healthy Daughter    Healthy Son    Healthy Son    Healthy Son    Colon polyps Neg Hx    Esophageal cancer Neg Hx    Rectal cancer Neg Hx    Stomach cancer Neg Hx     Current Outpatient Medications (Endocrine & Metabolic):    predniSONE (DELTASONE) 50 MG tablet, Take 1 pill daily for 5 days  Current Outpatient Medications (Cardiovascular):    amLODipine (NORVASC) 10 MG tablet, TAKE 1 TABLET BY MOUTH EVERY DAY   losartan-hydrochlorothiazide (HYZAAR) 100-25 MG tablet, TAKE 1 TABLET BY MOUTH EVERY DAY   rosuvastatin (CRESTOR) 10 MG tablet, Take 10 mg by mouth at bedtime.   Current Outpatient Medications (Analgesics):    aspirin 81 MG chewable tablet, Chew by mouth daily.   ibuprofen (ADVIL) 200 MG tablet, Take 200 mg by mouth every 6 (six) hours as needed.  Current Outpatient Medications (Hematological):    Cyanocobalamin (VITAMIN B-12 PO), Take 5,000 mcg by mouth. Liquid  Current Outpatient Medications  (Other):    gabapentin (NEURONTIN) 100 MG capsule, TAKE 2 CAPSULES (200 MG TOTAL) BY MOUTH AT BEDTIME.   MAGNESIUM PO, Take by mouth daily.   MULTIPLE VITAMIN PO, Take 1 capsule by mouth daily.   vitamin C (ASCORBIC ACID) 500 MG tablet, Take 500 mg by mouth daily.   Vitamin D, Ergocalciferol, (DRISDOL) 1.25 MG (50000 UNIT) CAPS capsule, Take 1 capsule (50,000 Units total) by  mouth every 7 (seven) days.   Reviewed prior external information including notes and imaging from  primary care provider As well as notes that were available from care everywhere and other healthcare systems.  Past medical history, social, surgical and family history all reviewed in electronic medical record.  Adams pertanent information unless stated regarding to the chief complaint.   Review of Systems:  Adams headache, visual changes, nausea, vomiting, diarrhea, constipation, dizziness, abdominal pain, skin rash, fevers, chills, night sweats, weight loss, swollen lymph nodes, body aches, joint swelling, chest pain, shortness of breath, mood changes. POSITIVE muscle aches  Objective  Blood pressure 118/82, pulse 72, height '5\' 1"'$  (1.549 m), weight 163 lb (73.9 kg), SpO2 99 %.   General: Adams apparent distress alert and oriented x3 mood and affect normal, dressed appropriately.  HEENT: Pupils equal, extraocular movements intact  Respiratory: Patient's speak in full sentences and does not appear short of breath  Cardiovascular: Adams lower extremity edema, non tender, Adams erythema  Left shoulder exam does have a positive O'Brien's, positive crossover noted. History worsening previous exam.  Questionable 4-5 strength of the rotator cuff with empty can sign.  Left knee does have a trace effusion noted at the patellofemoral joint.  Adams crepitus noted.  Lateral tracking of the patella noted.  Severe tenderness to palpation over the greater trochanteric area left greater than right.  Positive FABER test.  Negative straight leg  test.   Procedure: Real-time Ultrasound Guided Injection of left  greater trochanteric bursitis secondary to patient's body habitus Device: GE Logiq Q7  Ultrasound guided injection is preferred based studies that show increased duration, increased effect, greater accuracy, decreased procedural pain, increased response rate, and decreased cost with ultrasound guided versus blind injection.  Verbal informed consent obtained.  Time-out conducted.  Noted Adams overlying erythema, induration, or other signs of local infection.  Skin prepped in a sterile fashion.  Local anesthesia: Topical Ethyl chloride.  With sterile technique and under real time ultrasound guidance:  Greater trochanteric area was visualized and patient's bursa was noted. A 22-gauge 3 inch needle was inserted and 4 cc of 0.5% Marcaine and 1 cc of Kenalog 40 mg/dL was injected. Pictures taken Completed without difficulty  Pain immediately resolved suggesting accurate placement of the medication.  Advised to call if fevers/chills, erythema, induration, drainage, or persistent bleeding.  Impression: Technically successful ultrasound guided injection.  After informed written and verbal consent, patient was seated on exam table. Left knee was prepped with alcohol swab and utilizing anterolateral approach, patient's left knee space was injected with 4:1  marcaine 0.5%: Kenalog '40mg'$ /dL. Patient tolerated the procedure well without immediate complications.    Impression and Recommendations:      The above documentation has been reviewed and is accurate and complete Lyndal Pulley, DO

## 2022-10-02 ENCOUNTER — Encounter: Payer: Self-pay | Admitting: Family Medicine

## 2022-10-02 ENCOUNTER — Ambulatory Visit (INDEPENDENT_AMBULATORY_CARE_PROVIDER_SITE_OTHER): Payer: Medicare Other | Admitting: Family Medicine

## 2022-10-02 ENCOUNTER — Ambulatory Visit: Payer: Self-pay

## 2022-10-02 VITALS — BP 118/82 | HR 72 | Ht 61.0 in | Wt 163.0 lb

## 2022-10-02 DIAGNOSIS — M25562 Pain in left knee: Secondary | ICD-10-CM | POA: Diagnosis not present

## 2022-10-02 DIAGNOSIS — M1712 Unilateral primary osteoarthritis, left knee: Secondary | ICD-10-CM

## 2022-10-02 DIAGNOSIS — M7062 Trochanteric bursitis, left hip: Secondary | ICD-10-CM

## 2022-10-02 DIAGNOSIS — M25512 Pain in left shoulder: Secondary | ICD-10-CM | POA: Diagnosis not present

## 2022-10-02 DIAGNOSIS — M75112 Incomplete rotator cuff tear or rupture of left shoulder, not specified as traumatic: Secondary | ICD-10-CM

## 2022-10-02 DIAGNOSIS — M25552 Pain in left hip: Secondary | ICD-10-CM | POA: Diagnosis not present

## 2022-10-02 NOTE — Assessment & Plan Note (Signed)
Patient given injection and tolerated the procedure well, discussed icing regimen and home exercises, discussed which activities to do and which ones to avoid.  Increase activity slowly otherwise.  Differential includes lumbar radiculopathy likely discussed with patient having degenerative disc disease.  The patient is already on gabapentin and we will monitor.  X-rays pending at this time.  Follow-up again in 6 to 8 weeks

## 2022-10-02 NOTE — Assessment & Plan Note (Signed)
Patient's left shoulder is not responding as well as what we would anticipate.  Unfortunately patient is having acromioclavicular pain as well as impingement noted.  Has had complete tear of the right rotator cuff tear previously I am concerned that we may have some interstitial tearing that is not noted on ultrasound.  At this point I do feel an MR arthrogram is warranted.  Has failed steroid injections, formal physical therapy, and gabapentin at this point.  Depending on findings we will discuss conservative or surgical intervention at the next follow-up.

## 2022-10-02 NOTE — Assessment & Plan Note (Signed)
Patient given injection and tolerated the procedure well, discussed icing regimen and home exercises.  Discussed which activities to do and which ones to avoid.  Increase activity slowly otherwise.  Follow-up again in 6 to 8 weeks.  Could be candidate for viscosupplementation.

## 2022-10-02 NOTE — Patient Instructions (Addendum)
Injection in knee and GT today Good to see you! Do prescribed exercises at least 3x a week

## 2022-10-12 ENCOUNTER — Other Ambulatory Visit: Payer: Self-pay

## 2022-10-12 DIAGNOSIS — G8929 Other chronic pain: Secondary | ICD-10-CM

## 2022-10-31 ENCOUNTER — Ambulatory Visit
Admission: RE | Admit: 2022-10-31 | Discharge: 2022-10-31 | Disposition: A | Discharge status: Home / Self Care | Payer: Medicare Other | Source: Ambulatory Visit | Attending: Family Medicine | Admitting: Family Medicine

## 2022-10-31 DIAGNOSIS — S46012A Strain of muscle(s) and tendon(s) of the rotator cuff of left shoulder, initial encounter: Secondary | ICD-10-CM | POA: Diagnosis not present

## 2022-10-31 DIAGNOSIS — G8929 Other chronic pain: Secondary | ICD-10-CM

## 2022-10-31 DIAGNOSIS — M25512 Pain in left shoulder: Secondary | ICD-10-CM

## 2022-10-31 MED ORDER — IOPAMIDOL (ISOVUE-M 200) INJECTION 41%
20.0000 mL | Freq: Once | INTRAMUSCULAR | Status: AC
  Administered 2022-10-31: 20 mL via INTRA_ARTICULAR

## 2022-11-06 ENCOUNTER — Other Ambulatory Visit: Payer: Self-pay

## 2022-11-06 ENCOUNTER — Encounter: Payer: Self-pay | Admitting: Family Medicine

## 2022-11-06 DIAGNOSIS — M25512 Pain in left shoulder: Secondary | ICD-10-CM

## 2022-11-12 ENCOUNTER — Telehealth: Payer: Self-pay | Admitting: Family Medicine

## 2022-11-12 DIAGNOSIS — M75122 Complete rotator cuff tear or rupture of left shoulder, not specified as traumatic: Secondary | ICD-10-CM | POA: Diagnosis not present

## 2022-11-12 NOTE — Telephone Encounter (Signed)
Patient called stating that she is seeing Dr Tamera Punt today for her shoulder. Is has gotten really painful. She also said that she having a lot of hip pain on the left side. She asked if Dr Tamala Julian would be able to give her an injection? (She is already scheduled for next Tuesday with him)  She has been taking ibuprofen to help with the pain but it is now giving her stomach issues.

## 2022-11-12 NOTE — Progress Notes (Unsigned)
[place] [facility] [place] Phone: [phone] Subjective:   [name], am serving as a scribe for Dr. Hulan Saas.  I'm seeing this patient by the request  of:  Panosh, Standley Brooking, MD  CC: Left shoulder pain follow-up  [id number]  10/02/2022 Patient given injection and tolerated the procedure well, discussed icing regimen and home exercises, discussed which activities to do and which ones to avoid.  Increase activity slowly otherwise.  Differential includes lumbar radiculopathy likely discussed with patient having degenerative disc disease.  The patient is already on gabapentin and we will monitor.  X-rays pending at this time.  Follow-up again in 6 to 8 weeks   Update 11/20/2022 CRYSTELLE FERRUFINO is a 71 y.o. female coming in with complaint of L hip and shoulder pain.  Patient was having shoulder pain previously and was given an injection back in August 2023 in the acromioclavicular joint.  Unfortunately shoulder was not getting better and he was referred to orthopedic surgery.  Patient states that she seems to be having flare ups in knee, hip and shoulder. Today her knee and hip feel fine. Her shoulder remains her main source of pain. Would like handicap placard.      Past Medical History:  Diagnosis Date   Allergy    occ takes otc allergy pill   CAD (coronary artery disease)    a. 11/2005 Cath: nonobs dzs, 30% LAD lesion on cath ;  b. 11/2005 Echo: NL EF;  c. 04/2012 Echo: EF 55-60%, Gr 2 DD;  d. 04/2012 Ex MV: EF 72%, ST depression in recovery with NL perfusion imaging - HTN response to exercise.   Chest pressure "fatigue" 05/08/2012   GERD (gastroesophageal reflux disease)    Hepatic cyst    on ct [place]   HLD (hyperlipidemia)    HTN (hypertension)    Midsternal chest pain    cardiology eval with non-ischemic stress test 04/2012   Upper airway cough syndrome 04/29/2015   Trial of max gerd rx 04/29/2015 >>>     Past Surgical  History:  Procedure Laterality Date   ABDOMINAL HYSTERECTOMY     fibroid tumors   BREAST BIOPSY Right    BREAST EXCISIONAL BIOPSY Right    child birth x 5     CHOLECYSTECTOMY     COLONOSCOPY  01/09/2008   SHOULDER ARTHROSCOPY WITH ROTATOR CUFF REPAIR AND SUBACROMIAL DECOMPRESSION Right 11/03/2013   Procedure: RIGHT SHOULDER ARTHROSCOPY WITH DEBRIDEMENT, ROTATOR CUFF REPAIR AND SUBACROMIAL DECOMPRESSION, DISTAL CLAVICLE RESECTION;  Surgeon: Mcarthur Rossetti, MD;  Location: [facility];  Service: Orthopedics;  Laterality: Right;   VESICOVAGINAL FISTULA CLOSURE W/ TAH     for fibroid tumors   Social History   Socioeconomic History   Marital status: Married    Spouse name: Not on file   Number of children: 5   Years of education: Not on file   Highest education level: Not on file  Occupational History    Employer: [employer]  Tobacco Use   Smoking status: Never   Smokeless tobacco: Never  Vaping Use   Vaping Use: Never used  Substance and Sexual Activity   Alcohol use: No   Drug use: No   Sexual activity: Not on file  Other Topics Concern   Not on file  Social History Narrative   Married, full time Network engineer. 3 pets    [place]   6-8 hours sleep    Right Handed  Lives in a two story home.    Drinks 1 cup of Coffee   Social Determinants of Health   Financial Resource Strain: Low Risk  (08/16/2021)   Overall Financial Resource Strain (CARDIA)    Difficulty of Paying Living Expenses: Not hard at all  Food Insecurity: No Food Insecurity (08/02/2020)   Hunger Vital Sign    Worried About Running Out of Food in the Last Year: Never true    Ran Out of Food in the Last Year: Never true  Transportation Needs: No Transportation Needs (08/16/2021)   PRAPARE - Hydrologist (Medical): No    Lack of Transportation (Non-Medical): No  Physical Activity: Insufficiently Active (08/02/2020)   Exercise Vital Sign    Days of Exercise per Week: 3 days     Minutes of Exercise per Session: 30 min  Stress: Stress Concern Present (08/02/2020)   [place]    Feeling of Stress : To some extent  Social Connections: Moderately Integrated (08/02/2020)   Social Connection and Isolation Panel [NHANES]    Frequency of Communication with Friends and Family: More than three times a week    Frequency of Social Gatherings with Friends and Family: Three times a week    Attends Religious Services: Never    Active Member of Clubs or Organizations: Yes    Attends Music therapist: More than 4 times per year    Marital Status: Married   Allergies  Allergen Reactions   Lisinopril     REACTION: cough   Penicillins Nausea Only   Family History  Problem Relation Age of Onset   Breast cancer Sister    Thyroid disease Sister    Hypertension Mother    Colon cancer Brother 64       dx'd about age 43 per pt-    Prostate cancer Brother    Lung cancer Brother    Liver cancer Brother    ALS Brother    Healthy Daughter    Healthy Daughter    Healthy Son    Healthy Son    Healthy Son    Colon polyps Neg Hx    Esophageal cancer Neg Hx    Rectal cancer Neg Hx    Stomach cancer Neg Hx     Current Outpatient Medications (Endocrine & Metabolic):    predniSONE (DELTASONE) 50 MG tablet, Take 1 pill daily for 5 days  Current Outpatient Medications (Cardiovascular):    amLODipine (NORVASC) 10 MG tablet, TAKE 1 TABLET BY MOUTH EVERY DAY   losartan-hydrochlorothiazide (HYZAAR) 100-25 MG tablet, TAKE 1 TABLET BY MOUTH EVERY DAY   rosuvastatin (CRESTOR) 10 MG tablet, Take 10 mg by mouth at bedtime.   Current Outpatient Medications (Analgesics):    aspirin 81 MG chewable tablet, Chew by mouth daily.   ibuprofen (ADVIL) 200 MG tablet, Take 200 mg by mouth every 6 (six) hours as needed.  Current Outpatient Medications (Hematological):    Cyanocobalamin (VITAMIN B-12 PO), Take 5,000  mcg by mouth. Liquid  Current Outpatient Medications (Other):    gabapentin (NEURONTIN) 100 MG capsule, TAKE 2 CAPSULES (200 MG TOTAL) BY MOUTH AT BEDTIME.   MAGNESIUM PO, Take by mouth daily.   MULTIPLE VITAMIN PO, Take 1 capsule by mouth daily.   vitamin C (ASCORBIC ACID) 500 MG tablet, Take 500 mg by mouth daily.   Vitamin D, Ergocalciferol, (DRISDOL) 1.25 MG (50000 UNIT) CAPS capsule, Take 1 capsule (50,000 Units  total) by mouth every 7 (seven) days.   Reviewed prior external information including notes and imaging from  primary care provider As well as notes that were available from care everywhere and other healthcare systems.  Past medical history, social, surgical and family history all reviewed in electronic medical record.  No pertanent information unless stated regarding to the chief complaint.   Review of Systems:  No headache, visual changes, nausea, vomiting, diarrhea, constipation, dizziness, abdominal pain, skin rash, fevers, chills, night sweats, weight loss, swollen lymph nodes, body aches, joint swelling, chest pain, shortness of breath, mood changes. POSITIVE muscle aches  Objective  Blood pressure (!) 142/74, pulse 68, height '5\' 1"'$  (1.549 m), weight 168 lb (76.2 kg), SpO2 99 %.   General: No apparent distress alert and oriented x3 mood and affect normal, dressed appropriately.  HEENT: Pupils equal, extraocular movements intact  Respiratory: Patient's speak in full sentences and does not appear short of breath  Cardiovascular: No lower extremity edema, non tender, no erythema  Left shoulder exam shows impingement and positivePatient still has also were noted to have rotator cuff strength is 4 out of 5   Hip exam shows tenderness over the greater trochanteric area.  Patient has a mild positive FABER test.  Loss of lordosis of the lumbar spine.  Antalgic gait  Left knee does have a trace effusion noted as well.  Crepitus noted.  Instability with valgus and varus  force.  Abnormal thigh to calf ratio.   Impression and Recommendations:     The above documentation has been reviewed and is accurate and complete Lyndal Pulley, DO

## 2022-11-12 NOTE — Telephone Encounter (Signed)
Attempted to contact patient via phone 2x but her VM box is full. Sent patient MyChart message.

## 2022-11-20 ENCOUNTER — Ambulatory Visit (INDEPENDENT_AMBULATORY_CARE_PROVIDER_SITE_OTHER): Payer: Medicare Other | Admitting: Family Medicine

## 2022-11-20 ENCOUNTER — Ambulatory Visit: Payer: Self-pay

## 2022-11-20 VITALS — BP 142/74 | HR 68 | Ht 61.0 in | Wt 168.0 lb

## 2022-11-20 DIAGNOSIS — M7062 Trochanteric bursitis, left hip: Secondary | ICD-10-CM | POA: Diagnosis not present

## 2022-11-20 DIAGNOSIS — G8929 Other chronic pain: Secondary | ICD-10-CM | POA: Diagnosis not present

## 2022-11-20 DIAGNOSIS — M1712 Unilateral primary osteoarthritis, left knee: Secondary | ICD-10-CM

## 2022-11-20 DIAGNOSIS — M25512 Pain in left shoulder: Secondary | ICD-10-CM

## 2022-11-20 NOTE — Assessment & Plan Note (Signed)
Chronic problem but stable at the moment.  Did discuss great amount of time.  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  Has responded well to the gabapentin and encouraged her to take 200 mg at night and can add Tylenol if necessary.  Worsening symptoms we would consider injection but patient would like to hold at this time.  Given a handout again to discuss home exercises.  Follow-up again in 6 to 8 weeks total time reviewing patient's imaging as well as discussing with patient interaction with other providers 31 minutes.

## 2022-11-20 NOTE — Patient Instructions (Signed)
Exercises Gabapentin '200mg'$  at night with tylenol See me in 5 weeks See Dr. Griffin Basil for shoulder

## 2022-11-20 NOTE — Assessment & Plan Note (Signed)
Patient has not responded as well.  Has been referred to orthopedic surgery to discuss the possibility of acromioplasty.  Still think that this could be a potentially good idea for her with patient still having difficulty with daily activities such as even sleep.  Patient has failed all other conservative therapy including formal physical therapy, home exercises, icing regimen, and multiple injections.

## 2022-11-20 NOTE — Addendum Note (Signed)
Addended by: Carmie Kanner on: 11/20/2022 11:58 AM   Modules accepted: Orders

## 2022-11-20 NOTE — Assessment & Plan Note (Signed)
Known arthritic changes again.  Patient states that she is stable at the moment.  Would like to hold off.  Patient though because of the antalgic gait and having difficulty we will give her a handicap spot.  With patient having difficulty with ambulation was due to social determinants of health as well.  Patient does have some difficulty with transportation as well.

## 2022-11-27 ENCOUNTER — Encounter (HOSPITAL_BASED_OUTPATIENT_CLINIC_OR_DEPARTMENT_OTHER): Payer: Self-pay | Admitting: Orthopaedic Surgery

## 2022-11-27 ENCOUNTER — Encounter (HOSPITAL_BASED_OUTPATIENT_CLINIC_OR_DEPARTMENT_OTHER)
Admission: RE | Admit: 2022-11-27 | Discharge: 2022-11-27 | Disposition: A | Discharge status: Home / Self Care | Payer: Medicare Other | Source: Ambulatory Visit | Attending: Orthopaedic Surgery | Admitting: Orthopaedic Surgery

## 2022-11-27 ENCOUNTER — Other Ambulatory Visit: Payer: Self-pay

## 2022-11-27 DIAGNOSIS — I10 Essential (primary) hypertension: Secondary | ICD-10-CM | POA: Insufficient documentation

## 2022-11-27 DIAGNOSIS — M25512 Pain in left shoulder: Secondary | ICD-10-CM | POA: Diagnosis not present

## 2022-11-27 DIAGNOSIS — Z01818 Encounter for other preprocedural examination: Secondary | ICD-10-CM | POA: Diagnosis not present

## 2022-11-27 LAB — BASIC METABOLIC PANEL
Anion gap: 10 (ref 5–15)
BUN: 15 mg/dL (ref 8–23)
CO2: 24 mmol/L (ref 22–32)
Calcium: 10.1 mg/dL (ref 8.9–10.3)
Chloride: 109 mmol/L (ref 98–111)
Creatinine, Ser: 0.91 mg/dL (ref 0.44–1.00)
GFR, Estimated: >60 mL/min (ref >60)
Glucose, Bld: 66 mg/dL — ABNORMAL LOW (ref 70–99)
Potassium: 3.3 mmol/L — ABNORMAL LOW (ref 3.5–5.1)
Sodium: 143 mmol/L (ref 135–145)

## 2022-11-27 NOTE — Progress Notes (Signed)
Surgical soap given with instructions, pt verbalized understanding. Benzoyl peroxide gel given with instructions, pt verbalized understanding.  

## 2022-11-28 DIAGNOSIS — M25512 Pain in left shoulder: Secondary | ICD-10-CM | POA: Diagnosis not present

## 2022-11-28 NOTE — Discharge Instructions (Signed)
Post Anesthesia Home Care Instructions  Activity: Get plenty of rest for the remainder of the day. A responsible individual must stay with you for 24 hours following the procedure.  For the next 24 hours, DO NOT: -Drive a car -Paediatric nurse -Drink alcoholic beverages -Take any medication unless instructed by your physician -Make any legal decisions or sign important papers.  Meals: Start with liquid foods such as gelatin or soup. Progress to regular foods as tolerated. Avoid greasy, spicy, heavy foods. If nausea and/or vomiting occur, drink only clear liquids until the nausea and/or vomiting subsides. Call your physician if vomiting continues.  Special Instructions/Symptoms: Your throat may feel dry or sore from the anesthesia or the breathing tube placed in your throat during surgery. If this causes discomfort, gargle with warm salt water. The discomfort should disappear within 24 hours.  If you had a scopolamine patch placed behind your ear for the management of post- operative nausea and/or vomiting:  1. The medication in the patch is effective for 72 hours, after which it should be removed.  Wrap patch in a tissue and discard in the trash. Wash hands thoroughly with soap and water. 2. You may remove the patch earlier than 72 hours if you experience unpleasant side effects which may include dry mouth, dizziness or visual disturbances. 3. Avoid touching the patch. Wash your hands with soap and water after contact with the patch.     Regional Anesthesia Blocks  1. Numbness or the inability to move the "blocked" extremity may last from 3-48 hours after placement. The length of time depends on the medication injected and your individual response to the medication. If the numbness is not going away after 48 hours, call your surgeon.  2. The extremity that is blocked will need to be protected until the numbness is gone and the  Strength has returned. Because you cannot feel it, you  will need to take extra care to avoid injury. Because it may be weak, you may have difficulty moving it or using it. You may not know what position it is in without looking at it while the block is in effect.  3. For blocks in the legs and feet, returning to weight bearing and walking needs to be done carefully. You will need to wait until the numbness is entirely gone and the strength has returned. You should be able to move your leg and foot normally before you try and bear weight or walk. You will need someone to be with you when you first try to ensure you do not fall and possibly risk injury.  4. Bruising and tenderness at the needle site are common side effects and will resolve in a few days.  5. Persistent numbness or new problems with movement should be communicated to the surgeon or the [facility] [phone] [place] [phone]).  Next dose of tylenol may be taken at 4p  Ophelia Charter MD, MPH Noemi Chapel, PA-C [place] [address], [address] [phone] (tel)   [phone] (fax)   POST-OPERATIVE INSTRUCTIONS - SHOULDER ARTHROSCOPY  WOUND CARE You may remove the Operative Dressing on Post-Op Day #3 (72hrs after surgery).   Alternatively if you would like you can leave dressing on until follow-up if within 7-8 days but keep it dry. Leave steri-strips in place until they fall off on their own, usually 2 weeks postop. There may be a small amount of fluid/bleeding leaking at the surgical site.  This  is normal; the shoulder is filled with fluid during the procedure and can leak for 24-48hrs after surgery.  You may change/reinforce the bandage as needed.  Use the Cryocuff or Ice as often as possible for the first 7 days, then as needed for pain relief. Always keep a towel, ACE wrap or other barrier between the cooling unit and your skin.  You may shower on Post-Op Day #3. Gently pat the area dry.  Do not soak the  shoulder in water or submerge it.  Keep incisions as dry as possible. Do not go swimming in the pool or ocean until 4 weeks after surgery or when otherwise instructed.    EXERCISES Wear the sling at all times  You may remove the sling for showering, but keep the arm across the chest or in a secondary sling.     It is normal for your fingers/hand to become more swollen after surgery and discolored from bruising.   This will resolve over the first few weeks usually after surgery. Please continue to ambulate and do not stay sitting or lying for too long.  Perform foot and wrist pumps to assist in circulation.  PHYSICAL THERAPY - You will begin physical therapy soon after surgery (unless otherwise specified) - Please call to set up an appointment, if you do not already have one  - Let our office if there are any issues with scheduling your therapy  - Our physical therapy office will call you to schedule a physical therapy appointment  REGIONAL ANESTHESIA (NERVE BLOCKS) The anesthesia team may have performed a nerve block for you this is a great tool used to minimize pain.   The block may start wearing off overnight (between 8-24 hours postop) When the block wears off, your pain may go from nearly zero to the pain you would have had postop without the block. This is an abrupt transition but nothing dangerous is happening.   This can be a challenging period but utilize your as needed pain medications to try and manage this period. We suggest you use the pain medication the first night prior to going to bed, to ease this transition.  You may take an extra dose of narcotic when this happens if needed  POST-OP MEDICATIONS- Multimodal approach to pain control In general your pain will be controlled with a combination of substances.  Prescriptions unless otherwise discussed are electronically sent to your pharmacy.  This is a carefully made plan we use to minimize narcotic use.     Celebrex -  Anti-inflammatory medication taken on a scheduled basis Acetaminophen - Non-narcotic pain medicine taken on a scheduled basis  Oxycodone - This is a strong narcotic, to be used only on an "as needed" basis for SEVERE pain. Zofran - take as needed for nausea   FOLLOW-UP If you develop a Fever (?101.5), Redness or Drainage from the surgical incision site, please call our office to arrange for an evaluation. Please call the office to schedule a follow-up appointment for your first post-operative appointment, 7-10 days post-operatively.    HELPFUL INFORMATION   You may be more comfortable sleeping in a semi-seated position the first few nights following surgery.  Keep a pillow propped under the elbow and forearm for comfort.  If you have a recliner type of chair it might be beneficial.  If not that is fine too, but it would be helpful to sleep propped up with pillows behind your operated shoulder as well under your elbow and forearm.  This will reduce pulling on the suture lines.  When dressing, put your operative arm in the sleeve first.  When getting undressed, take your operative arm out last.  Loose fitting, button-down shirts are recommended.  Often in the first days after surgery you may be more comfortable keeping your operative arm under your shirt and not through the sleeve.  You may return to work/school in the next couple of days when you feel up to it.  Desk work and typing in the sling is fine.  We suggest you use the pain medication the first night prior to going to bed, in order to ease any pain when the anesthesia wears off. You should avoid taking pain medications on an empty stomach as it will make you nauseous.  You should wean off your narcotic medicines as soon as you are able.  Most patients will be off or using minimal narcotics before their first postop appointment.   Do not drink alcoholic beverages or take illicit drugs when taking pain medications.  It is against the  law to drive while taking narcotics.  In some states it is against the law to drive while your arm is in a sling.   Pain medication may make you constipated.  Below are a few solutions to try in this order: Decrease the amount of pain medication if you aren't having pain. Drink lots of decaffeinated fluids. Drink prune juice and/or eat dried prunes  If the first 3 don't work start with additional solutions Take Colace - an over-the-counter stool softener Take Senokot - an over-the-counter laxative Take Miralax - a stronger over-the-counter laxative  For more information including helpful videos and documents visit our website:   [email or web address]

## 2022-11-28 NOTE — H&P (Signed)
PREOPERATIVE H&P  Chief Complaint: LEFT SHOULDER CARTILAGE DISORDER, IMPINGMENT SYNDROME, OA, ROTATOR CUFF TEAR, BICEPS TENDINITIS  HPI: Kaylee Adams is a 71 y.o. female who is scheduled for, Procedure(s): SHOULDER ARTHROSCOPY WITH SUBACROMIAL DECOMPRESSION, ROTATOR CUFF REPAIR AND BICEP TENDON REPAIR SHOULDER ARTHROSCOPY WITH DISTAL CLAVICLE EXCISION.   Patient has a past medical history significant for CAD, GERD, HLD, HTN.   Patient has had left shoulder pain for quite some time. She notes the pain has been getting worse. She has tried and failed injections. Injections are no longer providing her with relief. She is frustrated by the function of her shoulder.   Symptoms are rated as moderate to severe, and have been worsening.  This is significantly impairing activities of daily living.    Please see clinic note for further details on this patient's care.    She has elected for surgical management.   Past Medical History:  Diagnosis Date   Allergy    occ takes otc allergy pill   Arthritis    CAD (coronary artery disease)    a. 11/2005 Cath: nonobs dzs, 30% LAD lesion on cath ;  b. 11/2005 Echo: NL EF;  c. 04/2012 Echo: EF 55-60%, Gr 2 DD;  d. 04/2012 Ex MV: EF 72%, ST depression in recovery with NL perfusion imaging - HTN response to exercise.   Chest pressure "fatigue" 05/08/2012   GERD (gastroesophageal reflux disease)    occ prilosec OTC   Hepatic cyst    on ct [place]   HLD (hyperlipidemia)    HTN (hypertension)    Midsternal chest pain    cardiology eval with non-ischemic stress test 04/2012   Upper airway cough syndrome 04/29/2015   Trial of max gerd rx 04/29/2015 >>>     Past Surgical History:  Procedure Laterality Date   ABDOMINAL HYSTERECTOMY     fibroid tumors   BREAST BIOPSY Right    BREAST EXCISIONAL BIOPSY Right    child birth x 5     CHOLECYSTECTOMY     COLONOSCOPY  01/09/2008   SHOULDER ARTHROSCOPY WITH ROTATOR CUFF REPAIR AND SUBACROMIAL DECOMPRESSION  Right 11/03/2013   Procedure: RIGHT SHOULDER ARTHROSCOPY WITH DEBRIDEMENT, ROTATOR CUFF REPAIR AND SUBACROMIAL DECOMPRESSION, DISTAL CLAVICLE RESECTION;  Surgeon: Mcarthur Rossetti, MD;  Location: [facility];  Service: Orthopedics;  Laterality: Right;   VESICOVAGINAL FISTULA CLOSURE W/ TAH     for fibroid tumors   Social History   Socioeconomic History   Marital status: Married    Spouse name: Not on file   Number of children: 5   Years of education: Not on file   Highest education level: Not on file  Occupational History    Employer: [employer]  Tobacco Use   Smoking status: Never   Smokeless tobacco: Never  Vaping Use   Vaping Use: Never used  Substance and Sexual Activity   Alcohol use: No   Drug use: No   Sexual activity: Not Currently    Birth control/protection: Surgical    Comment: Hyst  Other Topics Concern   Not on file  Social History Narrative   Married, full time Network engineer. 3 pets    [place]   6-8 hours sleep    Right Handed   Lives in a two story home.    Drinks 1 cup of Coffee   Social Determinants of Health   Financial Resource Strain: Low Risk  (08/16/2021)   Overall Financial Resource Strain (CARDIA)    Difficulty of Paying  Living Expenses: Not hard at all  Food Insecurity: No Food Insecurity (08/02/2020)   Hunger Vital Sign    Worried About Running Out of Food in the Last Year: Never true    Ran Out of Food in the Last Year: Never true  Transportation Needs: No Transportation Needs (08/16/2021)   PRAPARE - Hydrologist (Medical): No    Lack of Transportation (Non-Medical): No  Physical Activity: Insufficiently Active (08/02/2020)   Exercise Vital Sign    Days of Exercise per Week: 3 days    Minutes of Exercise per Session: 30 min  Stress: Stress Concern Present (08/02/2020)   [place]    Feeling of Stress : To some extent  Social Connections:  Moderately Integrated (08/02/2020)   Social Connection and Isolation Panel [NHANES]    Frequency of Communication with Friends and Family: More than three times a week    Frequency of Social Gatherings with Friends and Family: Three times a week    Attends Religious Services: Never    Active Member of Clubs or Organizations: Yes    Attends Music therapist: More than 4 times per year    Marital Status: Married   Family History  Problem Relation Age of Onset   Breast cancer Sister    Thyroid disease Sister    Hypertension Mother    Colon cancer Brother 7       dx'd about age 44 per pt-    Prostate cancer Brother    Lung cancer Brother    Liver cancer Brother    ALS Brother    Healthy Daughter    Healthy Daughter    Healthy Son    Healthy Son    Healthy Son    Colon polyps Neg Hx    Esophageal cancer Neg Hx    Rectal cancer Neg Hx    Stomach cancer Neg Hx    Allergies  Allergen Reactions   Lisinopril     REACTION: cough   Penicillins Nausea Only   Prior to Admission medications   Medication Sig Start Date End Date Taking? Authorizing Provider  acetaminophen (TYLENOL) 500 MG tablet Take 1,000 mg by mouth every 6 (six) hours as needed.   Yes [provider]  amLODipine (NORVASC) 10 MG tablet TAKE 1 TABLET BY MOUTH EVERY DAY 06/27/22  Yes Panosh, Standley Brooking, MD  aspirin 81 MG chewable tablet Chew by mouth daily.   Yes [provider]  Cyanocobalamin (VITAMIN B-12 PO) Take 5,000 mcg by mouth. Liquid   Yes [provider]  gabapentin (NEURONTIN) 100 MG capsule TAKE 2 CAPSULES (200 MG TOTAL) BY MOUTH AT BEDTIME. 09/05/21  Yes Lyndal Pulley, DO  ibuprofen (ADVIL) 200 MG tablet Take 200 mg by mouth every 6 (six) hours as needed.   Yes [provider]  losartan-hydrochlorothiazide (HYZAAR) 100-25 MG tablet TAKE 1 TABLET BY MOUTH EVERY DAY 09/24/22  Yes Panosh, Standley Brooking, MD  MAGNESIUM PO Take by mouth daily.   Yes [provider]   MULTIPLE VITAMIN PO Take 1 capsule by mouth daily.   Yes [provider]  omeprazole (PRILOSEC OTC) 20 MG tablet Take 20 mg by mouth daily.   Yes [provider]  Vitamin D, Ergocalciferol, (DRISDOL) 1.25 MG (50000 UNIT) CAPS capsule Take 1 capsule (50,000 Units total) by mouth every 7 (seven) days. 09/28/22  Yes Hulan Saas M, DO    ROS: All  other systems have been reviewed and were otherwise negative with the exception of those mentioned in the HPI and as above.  Physical Exam: General: Alert, no acute distress Cardiovascular: No pedal edema Respiratory: No cyanosis, no use of accessory musculature GI: No organomegaly, abdomen is soft and non-tender Skin: No lesions in the area of chief complaint Neurologic: Sensation intact distally Psychiatric: Patient is competent for consent with normal mood and affect Lymphatic: No axillary or cervical lymphadenopathy  MUSCULOSKELETAL:  Pain with ROM of the left shoulder. Weakness and pain with supraspinatus testing. +O'briens. +Impingement.   Imaging: MRI of the left shoulder demonstrates full thickness rotator cuff tear. Significant fluid around the biceps tendon. Type 2 acromion. Moderate AC joint arthritis.   Assessment: LEFT SHOULDER CARTILAGE DISORDER, IMPINGMENT SYNDROME, OA, ROTATOR CUFF TEAR, BICEPS TENDINITIS  Plan: Plan for Procedure(s): SHOULDER ARTHROSCOPY WITH SUBACROMIAL DECOMPRESSION, ROTATOR CUFF REPAIR AND BICEP TENDON REPAIR SHOULDER ARTHROSCOPY WITH DISTAL CLAVICLE EXCISION  The risks benefits and alternatives were discussed with the patient including but not limited to the risks of nonoperative treatment, versus surgical intervention including infection, bleeding, nerve injury,  blood clots, cardiopulmonary complications, morbidity, mortality, among others, and they were willing to proceed.   The patient acknowledged the explanation, agreed to proceed with the plan and consent was signed.    Operative Plan: Left shoulder scope with SAD, DCE, BT, RCR Discharge Medications: Standard DVT Prophylaxis: None Physical Therapy: Outpatient PT Special Discharge needs: Sling. Jeneen Montgomery, [place]  11/28/2022 5:01 PM

## 2022-11-29 ENCOUNTER — Ambulatory Visit (HOSPITAL_BASED_OUTPATIENT_CLINIC_OR_DEPARTMENT_OTHER): Payer: Medicare Other | Admitting: Certified Registered"

## 2022-11-29 ENCOUNTER — Encounter (HOSPITAL_BASED_OUTPATIENT_CLINIC_OR_DEPARTMENT_OTHER)
Admission: RE | Disposition: A | Discharge status: Home / Self Care | Payer: Self-pay | Source: Home / Self Care | Attending: Orthopaedic Surgery

## 2022-11-29 ENCOUNTER — Other Ambulatory Visit: Payer: Self-pay

## 2022-11-29 ENCOUNTER — Ambulatory Visit (HOSPITAL_BASED_OUTPATIENT_CLINIC_OR_DEPARTMENT_OTHER)
Admission: RE | Admit: 2022-11-29 | Discharge: 2022-11-29 | Disposition: A | Discharge status: Home / Self Care | Payer: Medicare Other | Attending: Orthopaedic Surgery | Admitting: Orthopaedic Surgery

## 2022-11-29 ENCOUNTER — Encounter (HOSPITAL_BASED_OUTPATIENT_CLINIC_OR_DEPARTMENT_OTHER): Payer: Self-pay | Admitting: Orthopaedic Surgery

## 2022-11-29 DIAGNOSIS — S43432A Superior glenoid labrum lesion of left shoulder, initial encounter: Secondary | ICD-10-CM | POA: Diagnosis not present

## 2022-11-29 DIAGNOSIS — X58XXXA Exposure to other specified factors, initial encounter: Secondary | ICD-10-CM | POA: Insufficient documentation

## 2022-11-29 DIAGNOSIS — M25812 Other specified joint disorders, left shoulder: Secondary | ICD-10-CM | POA: Insufficient documentation

## 2022-11-29 DIAGNOSIS — Z79899 Other long term (current) drug therapy: Secondary | ICD-10-CM | POA: Diagnosis not present

## 2022-11-29 DIAGNOSIS — S46012A Strain of muscle(s) and tendon(s) of the rotator cuff of left shoulder, initial encounter: Secondary | ICD-10-CM | POA: Insufficient documentation

## 2022-11-29 DIAGNOSIS — M7552 Bursitis of left shoulder: Secondary | ICD-10-CM | POA: Diagnosis not present

## 2022-11-29 DIAGNOSIS — E785 Hyperlipidemia, unspecified: Secondary | ICD-10-CM | POA: Insufficient documentation

## 2022-11-29 DIAGNOSIS — K219 Gastro-esophageal reflux disease without esophagitis: Secondary | ICD-10-CM | POA: Insufficient documentation

## 2022-11-29 DIAGNOSIS — M19012 Primary osteoarthritis, left shoulder: Secondary | ICD-10-CM | POA: Diagnosis not present

## 2022-11-29 DIAGNOSIS — I10 Essential (primary) hypertension: Secondary | ICD-10-CM | POA: Diagnosis not present

## 2022-11-29 DIAGNOSIS — M75102 Unspecified rotator cuff tear or rupture of left shoulder, not specified as traumatic: Secondary | ICD-10-CM

## 2022-11-29 DIAGNOSIS — I2510 Atherosclerotic heart disease of native coronary artery without angina pectoris: Secondary | ICD-10-CM | POA: Insufficient documentation

## 2022-11-29 DIAGNOSIS — G8918 Other acute postprocedural pain: Secondary | ICD-10-CM | POA: Diagnosis not present

## 2022-11-29 DIAGNOSIS — M7542 Impingement syndrome of left shoulder: Secondary | ICD-10-CM

## 2022-11-29 DIAGNOSIS — M7522 Bicipital tendinitis, left shoulder: Secondary | ICD-10-CM

## 2022-11-29 DIAGNOSIS — M24112 Other articular cartilage disorders, left shoulder: Secondary | ICD-10-CM | POA: Diagnosis not present

## 2022-11-29 HISTORY — PX: SHOULDER ARTHROSCOPY WITH DISTAL CLAVICLE RESECTION: SHX5675

## 2022-11-29 HISTORY — PX: SHOULDER ARTHROSCOPY WITH SUBACROMIAL DECOMPRESSION, ROTATOR CUFF REPAIR AND BICEP TENDON REPAIR: SHX5687

## 2022-11-29 HISTORY — DX: Unspecified osteoarthritis, unspecified site: M19.90

## 2022-11-29 SURGERY — SHOULDER ARTHROSCOPY WITH SUBACROMIAL DECOMPRESSION, ROTATOR CUFF REPAIR AND BICEP TENDON REPAIR
Anesthesia: Regional | Site: Shoulder | Laterality: Left

## 2022-11-29 MED ORDER — ACETAMINOPHEN 500 MG PO TABS: 1000.0000 mg | ORAL_TABLET | Freq: Three times a day (TID) | ORAL | 0 refills | Status: AC

## 2022-11-29 MED ORDER — ROCURONIUM BROMIDE 10 MG/ML (PF) SYRINGE
PREFILLED_SYRINGE | INTRAVENOUS | Status: AC
  Filled 2022-11-29: qty 10

## 2022-11-29 MED ORDER — PROPOFOL 10 MG/ML IV BOLUS
INTRAVENOUS | Status: DC | PRN
  Administered 2022-11-29: 150 mg via INTRAVENOUS

## 2022-11-29 MED ORDER — OXYCODONE HCL 5 MG PO TABS: ORAL_TABLET | ORAL | 0 refills | Status: AC

## 2022-11-29 MED ORDER — ONDANSETRON HCL 4 MG/2ML IJ SOLN
INTRAMUSCULAR | Status: AC
  Filled 2022-11-29: qty 2

## 2022-11-29 MED ORDER — ONDANSETRON HCL 4 MG/2ML IJ SOLN
INTRAMUSCULAR | Status: DC | PRN
  Administered 2022-11-29: 4 mg via INTRAVENOUS

## 2022-11-29 MED ORDER — TRANEXAMIC ACID-NACL 1000-0.7 MG/100ML-% IV SOLN
1000.0000 mg | INTRAVENOUS | Status: AC
  Administered 2022-11-29: 1000 mg via INTRAVENOUS

## 2022-11-29 MED ORDER — CELECOXIB 100 MG PO CAPS: 100.0000 mg | ORAL_CAPSULE | Freq: Two times a day (BID) | ORAL | 0 refills | Status: AC

## 2022-11-29 MED ORDER — FENTANYL CITRATE (PF) 100 MCG/2ML IJ SOLN: 25.0000 ug | INTRAMUSCULAR | Status: DC | PRN

## 2022-11-29 MED ORDER — BUPIVACAINE LIPOSOME 1.3 % IJ SUSP
INTRAMUSCULAR | Status: DC | PRN
  Administered 2022-11-29: 10 mL via PERINEURAL

## 2022-11-29 MED ORDER — LACTATED RINGERS IV SOLN: INTRAVENOUS | Status: DC

## 2022-11-29 MED ORDER — SUGAMMADEX SODIUM 200 MG/2ML IV SOLN
INTRAVENOUS | Status: DC | PRN
  Administered 2022-11-29: 299.2 mg via INTRAVENOUS

## 2022-11-29 MED ORDER — LIDOCAINE 2% (20 MG/ML) 5 ML SYRINGE
INTRAMUSCULAR | Status: AC
  Filled 2022-11-29: qty 5

## 2022-11-29 MED ORDER — PHENYLEPHRINE HCL-NACL 20-0.9 MG/250ML-% IV SOLN
INTRAVENOUS | Status: DC | PRN
  Administered 2022-11-29: 25 ug/min via INTRAVENOUS

## 2022-11-29 MED ORDER — LIDOCAINE 2% (20 MG/ML) 5 ML SYRINGE
INTRAMUSCULAR | Status: DC | PRN
  Administered 2022-11-29: 100 mg via INTRAVENOUS

## 2022-11-29 MED ORDER — GABAPENTIN 300 MG PO CAPS
ORAL_CAPSULE | ORAL | Status: AC
  Filled 2022-11-29: qty 1

## 2022-11-29 MED ORDER — ACETAMINOPHEN 10 MG/ML IV SOLN: 1000.0000 mg | Freq: Once | INTRAVENOUS | Status: DC | PRN

## 2022-11-29 MED ORDER — FENTANYL CITRATE (PF) 100 MCG/2ML IJ SOLN
INTRAMUSCULAR | Status: DC | PRN
  Administered 2022-11-29 (×2): 50 ug via INTRAVENOUS

## 2022-11-29 MED ORDER — FENTANYL CITRATE (PF) 100 MCG/2ML IJ SOLN
100.0000 ug | Freq: Once | INTRAMUSCULAR | Status: AC
  Administered 2022-11-29: 50 ug via INTRAVENOUS

## 2022-11-29 MED ORDER — MIDAZOLAM HCL 2 MG/2ML IJ SOLN
INTRAMUSCULAR | Status: AC
  Filled 2022-11-29: qty 2

## 2022-11-29 MED ORDER — ROCURONIUM BROMIDE 100 MG/10ML IV SOLN
INTRAVENOUS | Status: DC | PRN
  Administered 2022-11-29: 50 mg via INTRAVENOUS

## 2022-11-29 MED ORDER — ACETAMINOPHEN 500 MG PO TABS
1000.0000 mg | ORAL_TABLET | Freq: Once | ORAL | Status: AC
  Administered 2022-11-29: 1000 mg via ORAL

## 2022-11-29 MED ORDER — CEFAZOLIN SODIUM-DEXTROSE 2-4 GM/100ML-% IV SOLN
INTRAVENOUS | Status: AC
  Filled 2022-11-29: qty 100

## 2022-11-29 MED ORDER — FENTANYL CITRATE (PF) 100 MCG/2ML IJ SOLN
INTRAMUSCULAR | Status: AC
  Filled 2022-11-29: qty 2

## 2022-11-29 MED ORDER — ACETAMINOPHEN 500 MG PO TABS
ORAL_TABLET | ORAL | Status: AC
  Filled 2022-11-29: qty 2

## 2022-11-29 MED ORDER — ONDANSETRON HCL 4 MG PO TABS: 4.0000 mg | ORAL_TABLET | Freq: Three times a day (TID) | ORAL | 0 refills | Status: AC | PRN

## 2022-11-29 MED ORDER — BUPIVACAINE HCL (PF) 0.5 % IJ SOLN
INTRAMUSCULAR | Status: DC | PRN
  Administered 2022-11-29: 15 mL via PERINEURAL

## 2022-11-29 MED ORDER — OXYCODONE HCL 5 MG PO TABS
5.0000 mg | ORAL_TABLET | Freq: Once | ORAL | Status: AC
  Administered 2022-11-29: 5 mg via ORAL

## 2022-11-29 MED ORDER — MIDAZOLAM HCL 2 MG/2ML IJ SOLN
2.0000 mg | Freq: Once | INTRAMUSCULAR | Status: AC
  Administered 2022-11-29: 1 mg via INTRAVENOUS

## 2022-11-29 MED ORDER — GABAPENTIN 300 MG PO CAPS
300.0000 mg | ORAL_CAPSULE | Freq: Once | ORAL | Status: AC
  Administered 2022-11-29: 300 mg via ORAL

## 2022-11-29 MED ORDER — TRANEXAMIC ACID-NACL 1000-0.7 MG/100ML-% IV SOLN
INTRAVENOUS | Status: AC
  Filled 2022-11-29: qty 100

## 2022-11-29 MED ORDER — PHENYLEPHRINE HCL (PRESSORS) 10 MG/ML IV SOLN
INTRAVENOUS | Status: DC | PRN
  Administered 2022-11-29: 80 ug via INTRAVENOUS
  Administered 2022-11-29: 160 ug via INTRAVENOUS
  Administered 2022-11-29 (×2): 80 ug via INTRAVENOUS

## 2022-11-29 MED ORDER — DEXAMETHASONE SODIUM PHOSPHATE 10 MG/ML IJ SOLN
INTRAMUSCULAR | Status: DC | PRN
  Administered 2022-11-29: 5 mg via INTRAVENOUS

## 2022-11-29 MED ORDER — OXYCODONE HCL 5 MG PO TABS
ORAL_TABLET | ORAL | Status: AC
  Filled 2022-11-29: qty 1

## 2022-11-29 MED ORDER — SUGAMMADEX SODIUM 500 MG/5ML IV SOLN
INTRAVENOUS | Status: AC
  Filled 2022-11-29: qty 5

## 2022-11-29 MED ORDER — DEXAMETHASONE SODIUM PHOSPHATE 10 MG/ML IJ SOLN
INTRAMUSCULAR | Status: AC
  Filled 2022-11-29: qty 1

## 2022-11-29 MED ORDER — ONDANSETRON HCL 4 MG/2ML IJ SOLN: 4.0000 mg | Freq: Once | INTRAMUSCULAR | Status: DC | PRN

## 2022-11-29 MED ORDER — CEFAZOLIN SODIUM-DEXTROSE 2-4 GM/100ML-% IV SOLN
2.0000 g | INTRAVENOUS | Status: AC
  Administered 2022-11-29: 2 g via INTRAVENOUS

## 2022-11-29 MED ORDER — SODIUM CHLORIDE 0.9 % IR SOLN
Status: DC | PRN
  Administered 2022-11-29: 6000 mL

## 2022-11-29 SURGICAL SUPPLY — 63 items
AID PSTN UNV HD RSTRNT DISP (MISCELLANEOUS) ×2
ANCH SUT 2 FBRTK KNTLS 1.8 (Anchor) ×4 IMPLANT
ANCH SUT 2.6 FBRSTK 1.7 (Anchor) ×2 IMPLANT
ANCH SUT 2.6 FBRTK 1.7 (Anchor) ×2 IMPLANT
ANCH SUT 2.6 FBRTK 1.7 KNTLS (Anchor) ×2 IMPLANT
ANCH SUT SWLK 19.1X4.75 (Anchor) ×4 IMPLANT
ANCHOR FIBERTAK RC 2.6 (BLUE) (Anchor) IMPLANT
ANCHOR SUT 1.8 FIBERTAK SB KL (Anchor) IMPLANT
ANCHOR SUT BIO SW 4.75X19.1 (Anchor) IMPLANT
ANCHOR SUT FBRTK 2.6X1.7X2 (Anchor) IMPLANT
APL PRP STRL LF DISP 70% ISPRP (MISCELLANEOUS) ×2
BLADE EXCALIBUR 4.0X13 (MISCELLANEOUS) ×2 IMPLANT
BURR OVAL 8 FLU 4.0X13 (MISCELLANEOUS) ×2 IMPLANT
CANNULA 5.75X71 LONG (CANNULA) IMPLANT
CANNULA PASSPORT 5 (CANNULA) IMPLANT
CANNULA PASSPORT BUTTON 10-40 (CANNULA) IMPLANT
CANNULA TWIST IN 8.25X7CM (CANNULA) IMPLANT
CHLORAPREP W/TINT 26 (MISCELLANEOUS) ×2 IMPLANT
CLSR STERI-STRIP ANTIMIC 1/2X4 (GAUZE/BANDAGES/DRESSINGS) ×2 IMPLANT
COOLER ICEMAN CLASSIC (MISCELLANEOUS) ×2 IMPLANT
DRAPE IMP U-DRAPE 54X76 (DRAPES) ×2 IMPLANT
DRAPE INCISE IOBAN 66X45 STRL (DRAPES) IMPLANT
DRAPE SHOULDER BEACH CHAIR (DRAPES) ×2 IMPLANT
DW OUTFLOW CASSETTE/TUBE SET (MISCELLANEOUS) ×2 IMPLANT
GAUZE PAD ABD 8X10 STRL (GAUZE/BANDAGES/DRESSINGS) ×2 IMPLANT
GAUZE SPONGE 4X4 12PLY STRL (GAUZE/BANDAGES/DRESSINGS) ×2 IMPLANT
GLOVE BIO SURGEON STRL SZ 6.5 (GLOVE) ×2 IMPLANT
GLOVE BIOGEL PI IND STRL 6.5 (GLOVE) ×2 IMPLANT
GLOVE BIOGEL PI IND STRL 8 (GLOVE) ×2 IMPLANT
GLOVE ECLIPSE 8.0 STRL XLNG CF (GLOVE) ×2 IMPLANT
GOWN STRL REUS W/ TWL LRG LVL3 (GOWN DISPOSABLE) ×4 IMPLANT
GOWN STRL REUS W/TWL LRG LVL3 (GOWN DISPOSABLE) ×4
GOWN STRL REUS W/TWL XL LVL3 (GOWN DISPOSABLE) ×2 IMPLANT
IMPL FIBERTAK KNTLS 2.6 (Anchor) IMPLANT
IMPLANT FIBERTAK KNTLS 2.6 (Anchor) ×2 IMPLANT
KIT STABILIZATION SHOULDER (MISCELLANEOUS) ×2 IMPLANT
KIT STR SPEAR 1.8 FBRTK DISP (KITS) IMPLANT
LASSO CRESCENT QUICKPASS (SUTURE) IMPLANT
MANIFOLD NEPTUNE II (INSTRUMENTS) ×2 IMPLANT
NDL SAFETY ECLIP 18X1.5 (MISCELLANEOUS) ×2 IMPLANT
NDL SCORPION MULTI FIRE (NEEDLE) IMPLANT
NEEDLE SCORPION MULTI FIRE (NEEDLE) ×2 IMPLANT
PACK ARTHROSCOPY DSU (CUSTOM PROCEDURE TRAY) ×2 IMPLANT
PACK BASIN DAY SURGERY FS (CUSTOM PROCEDURE TRAY) ×2 IMPLANT
PAD COLD SHLDR WRAP-ON (PAD) ×2 IMPLANT
PORT APPOLLO RF 90DEGREE MULTI (SURGICAL WAND) ×2 IMPLANT
RESTRAINT HEAD UNIVERSAL NS (MISCELLANEOUS) ×2 IMPLANT
SHEET MEDIUM DRAPE 40X70 STRL (DRAPES) ×2 IMPLANT
SLEEVE SCD COMPRESS KNEE MED (STOCKING) ×2 IMPLANT
SLING ARM FOAM STRAP LRG (SOFTGOODS) IMPLANT
SUT FIBERWIRE #2 38 T-5 BLUE (SUTURE)
SUT MNCRL AB 4-0 PS2 18 (SUTURE) ×2 IMPLANT
SUT PDS AB 1 CT  36 (SUTURE)
SUT PDS AB 1 CT 36 (SUTURE) IMPLANT
SUT TIGER TAPE 7 IN WHITE (SUTURE) IMPLANT
SUTURE FIBERWR #2 38 T-5 BLUE (SUTURE) IMPLANT
SUTURE TAPE TIGERLINK 1.3MM BL (SUTURE) IMPLANT
SUTURETAPE TIGERLINK 1.3MM BL (SUTURE)
SYR 5ML LL (SYRINGE) ×2 IMPLANT
TAPE FIBER 2MM 7IN #2 BLUE (SUTURE) IMPLANT
TOWEL GREEN STERILE FF (TOWEL DISPOSABLE) ×4 IMPLANT
TUBE CONNECTING 20X1/4 (TUBING) ×2 IMPLANT
TUBING ARTHROSCOPY IRRIG 16FT (MISCELLANEOUS) ×2 IMPLANT

## 2022-11-29 NOTE — Anesthesia Postprocedure Evaluation (Signed)
Anesthesia Post Note  Patient: Kaylee Adams  Procedure(s) Performed: SHOULDER ARTHROSCOPY WITH SUBACROMIAL DECOMPRESSION, ROTATOR CUFF REPAIR AND BICEP TENDON REPAIR (Left) SHOULDER ARTHROSCOPY WITH DISTAL CLAVICLE EXCISION (Left: Shoulder)     Patient location during evaluation: PACU Anesthesia Type: Regional and General Level of consciousness: awake and alert Pain management: pain level controlled Vital Signs Assessment: post-procedure vital signs reviewed and stable Respiratory status: spontaneous breathing, nonlabored ventilation, respiratory function stable and patient connected to nasal cannula oxygen Cardiovascular status: blood pressure returned to baseline and stable Postop Assessment: no apparent nausea or vomiting Anesthetic complications: no  No notable events documented.  Last Vitals:  Vitals:   11/29/22 1338 11/29/22 1350  BP:  131/60  Pulse: 83 80  Resp: (!) 23 18  Temp:  36.6 C  SpO2: 94% 94%    Last Pain:  Vitals:   11/29/22 1426  TempSrc:   PainSc: 2                  Barnet Glasgow

## 2022-11-29 NOTE — Interval H&P Note (Signed)
All questions answered, patient wants to proceed with procedure. ? ?

## 2022-11-29 NOTE — Anesthesia Preprocedure Evaluation (Addendum)
Anesthesia Evaluation  Patient identified by MRN, date of birth, ID band Patient awake    Reviewed: Allergy & Precautions, NPO status , Patient's Chart, lab work & pertinent test results  Airway Mallampati: II  TM Distance: >3 FB Neck ROM: Full    Dental no notable dental hx. (+) Teeth Intact, Dental Advisory Given   Pulmonary neg pulmonary ROS   Pulmonary exam normal breath sounds clear to auscultation       Cardiovascular hypertension, Normal cardiovascular exam Rhythm:Regular Rate:Normal     Neuro/Psych negative neurological ROS  negative psych ROS   GI/Hepatic ,GERD  ,,  Endo/Other    Renal/GU Lab Results      Component                Value               Date                      CREATININE               0.91                11/27/2022                BUN                      15                  11/27/2022                NA                       143                 11/27/2022                K                        3.3 (L)             11/27/2022                CL                       109                 11/27/2022                CO2                      24                  11/27/2022                Musculoskeletal  (+) Arthritis ,    Abdominal   Peds  Hematology   Anesthesia Other Findings All: PC lisinopril  Reproductive/Obstetrics                             Anesthesia Physical Anesthesia Plan  ASA: 2  Anesthesia Plan: General and Regional   Post-op Pain Management: Regional block*, Minimal or no pain anticipated and Precedex   Induction: Intravenous  PONV Risk Score and Plan: 4 or greater and Treatment may vary due to age or medical condition, Midazolam, Dexamethasone  and Ondansetron  Airway Management Planned: Oral ETT  Additional Equipment: None  Intra-op Plan:   Post-operative Plan: Extubation in OR  Informed Consent: I have reviewed the patients History and  Physical, chart, labs and discussed the procedure including the risks, benefits and alternatives for the proposed anesthesia with the patient or authorized representative who has indicated his/her understanding and acceptance.     Dental advisory given  Plan Discussed with: Anesthesiologist, CRNA and Surgeon  Anesthesia Plan Comments: (GA w L ISB)       Anesthesia Quick Evaluation

## 2022-11-29 NOTE — Progress Notes (Signed)
Assisted Dr. Valma Cava with left, interscalene , ultrasound guided block. Side rails up, monitors on throughout procedure. See vital signs in flow sheet. Tolerated Procedure well.

## 2022-11-29 NOTE — Op Note (Signed)
Orthopaedic Surgery Operative Note (CSN: [id number])  Kaylee Adams  12/13/1951 Date of Surgery: 11/29/2022   DIAGNOSES: Left shoulder, chronic rotator cuff tear, SLAP tear, biceps tendinitis, AC arthritis, and subacromial impingement.  POST-OPERATIVE DIAGNOSIS: same  PROCEDURE: Arthroscopic extensive debridement - 29823 Subdeltoid Bursa, Supraspinatus Tendon, Anterior Labrum, Superior Labrum, and glenoid bone, glenoid cartilage, humeral bone, humeral cartilage. Arthroscopic distal clavicle excision - 78295 Arthroscopic subacromial decompression - 62130 Arthroscopic rotator cuff repair - 86578 Arthroscopic biceps tenodesis - 46962   OPERATIVE FINDING: Exam under anesthesia: Normal Articular space:  Significant anterior superior labral fraying Chondral surfaces: Normal Biceps:  Type II SLAP tear and intrasubstance tearing Subscapularis: Incomplete tear mild fraying about 3 to 5% of the upper border Supraspinatus: Complete tear retracted about 2 cm, moderate tissue quality at best. Infraspinatus: Complete tear as above   Post-operative plan: The patient will be non-weightbearing in a sling for 6 weeks.  The patient will be discharged home.  DVT prophylaxis not indicated in ambulatory upper extremity patient without known risk factors.   Pain control with PRN pain medication preferring oral medicines.  Follow up plan will be scheduled in approximately 7 days for incision check and XR.  Surgeons:Primary: Hiram Gash, MD Assistants:Caroline McBane PA-C Location: [facility] OR ROOM 1 Anesthesia: General with Exparel interscalene block Antibiotics: Ancef 2 g Tourniquet time: None Estimated Blood Loss: Minimal Complications: None Specimens: None Implants: Implant Name Type Inv. Item Serial No. Manufacturer Lot No. LRB No. Used Action  ANCHOR SUT 1.8 FIBERTAK SB KL - [id number] Anchor ANCHOR SUT 1.8 FIBERTAK SB KL  [employer] [id number] Left 1 Implanted  ANCHOR SUT 1.8 FIBERTAK SB KL -  [id number] Anchor ANCHOR SUT 1.8 FIBERTAK SB KL  [employer] [id number] Left 1 Implanted  IMPLANT FIBERTAK KNTLS 2.6 - [id number] Anchor IMPLANT FIBERTAK KNTLS 2.6  [employer] [id number] Left 1 Implanted  ANCHOR FIBERTAK RC 2.6 (BLUE) - [id number] Anchor ANCHOR FIBERTAK RC 2.6 (BLUE)  [employer] [id number] Left 1 Implanted  ANCHOR SUT FBRTK 2.6X1.7X2 - [id number] Anchor ANCHOR SUT FBRTK 2.6X1.7X2  [employer] [id number] Left 1 Implanted  ANCHOR SUT BIO SW 4.75X19.1 - [id number] Anchor ANCHOR SUT BIO SW 4.75X19.1  [employer] [id number] Left 1 Implanted  ANCHOR SUT BIO SW 4.75X19.1 - [id number] Anchor ANCHOR SUT BIO SW 4.75X19.1  Rolinda Roan [id number] Left 1 Implanted    Indications for Surgery:   Kaylee Adams is a 71 y.o. female with continued shoulder pain refractory to nonoperative measures for extended period of time.    The risks and benefits were explained at length including but not limited to continued pain, cuff failure, biceps tenodesis failure, stiffness, need for further surgery and infection.   Procedure:   Patient was correctly identified in the preoperative holding area and operative site marked.  Patient brought to OR and positioned beachchair on an [place] table ensuring that all bony prominences were padded and the head was in an appropriate location.  Anesthesia was induced and the operative shoulder was prepped and draped in the usual sterile fashion.  Timeout was called preincision.  A standard posterior viewing portal was made after localizing the portal with a spinal needle.  An anterior accessory portal was also made.  After clearing the articular space the camera was positioned in the subacromial space.  Findings above.    Extensive debridement was performed of the anterior interval tissue, labral fraying and the bursa. Humeral, glenoid bone and cartilage  Subacromial decompression: We made a lateral portal  with spinal needle guidance. We then proceeded to debride bursal tissue  extensively with a shaver and arthrocare device. At that point we continued to identify the borders of the acromion and identify the spur. We then carefully preserved the deltoid fascia and used a burr to convert the acromion to a Type 1 flat acromion without issue.  Arthroscopic Rotator Cuff Repair: Tuberosity was prepared with a burr to a bleeding bed.  Following completion of the above we placed 2 4.7 Swivelock anchor loaded with a tape at inserted at the medial articular margin and an scorpion suture passing device, shuttled  sutures medially in a horizontal mattress suture configuration.  We then tied using arthroscopic knot tying techniques  each suture to its partner reducing the tendon at the prepared insertion site.  The fiber tape was not tied. With a medial row suture limbs then incorporated, 2 anteriorly and  2 posteriorly, into each of two 4.75 PEEK SwiveLock anchors, each placed 8 to 10 mm below the tip of the tuberosity and spanning anterior-posterior width of the tear with care to avoid over tensioning.   Biceps tenodesis: We marked the tendon and then performed a tenotomy and debridement of the stump in the articular space. We then identified the biceps tendon in its groove suprapec with the arthroscope in the lateral portal taking care to move from lateral to medial to avoid injury to the subscapularis. At that point we unroofed the tendon itself and mobilized it. An accessory anterior portal was made in line with the tendon and we grasped it from the anterior superior portal and worked from the accessory anterior portal. Two Fibertak 1.27m knotless anchors were placed in the groove and the tendon was secured in a luggage loop style fashion with a pass of the limb of suture through the tendon using a scorpion device to avoid pull-through.  Repair was completed with good tension on the tendon.  Residual stump of the tendon was removed after being resected with a RF ablator.  Arthroscopic  rotator cuff repair: Once the above is complete we used a shaver as well as a bur to prepare the tuberosity and clear soft tissue so that there was bony bleeding and appropriate bloody flecks for healing.  We then placed 3 Medial Row 2.6 mm fiber tack anchors with knotless mechanisms and tape.  We used a scorpion as well as a link suture to pass all of the sutures from each anchor through the cuff.  We then were able to use the knotless mechanism sutures to perform a double medial row tiedown compressing the medial row without overtensioning.  Once this was complete we cut the switch sutures and performed a speed bridge type repair pulling the tapes over to 2 lateral row 4.75 mm bio composite swivel locks.  This provided compression of the cuff to the tuberosity.  The incisions were closed with absorbable monocryl and steri strips.  A sterile dressing was placed along with a sling. The patient was awoken from general anesthesia and taken to the PACU in stable condition without complication.   CNoemi Chapel PA-C, present and scrubbed throughout the case, critical for completion in a timely fashion, and for retraction, instrumentation, closure.

## 2022-11-29 NOTE — Anesthesia Procedure Notes (Signed)
Procedure Name: Intubation Date/Time: 11/29/2022 11:32 AM  Performed by: Lavonia Dana, CRNAPre-anesthesia Checklist: Patient identified, Emergency Drugs available, Suction available and Patient being monitored Patient Re-evaluated:Patient Re-evaluated prior to induction Oxygen Delivery Method: Circle system utilized Preoxygenation: Pre-oxygenation with 100% oxygen Induction Type: IV induction Ventilation: Mask ventilation without difficulty Laryngoscope Size: Mac and 3 Grade View: Grade II Tube type: Oral Tube size: 7.0 mm Number of attempts: 1 Airway Equipment and Method: Stylet and Bite block Placement Confirmation: ETT inserted through vocal cords under direct vision, positive ETCO2 and breath sounds checked- equal and bilateral Secured at: 22 cm Tube secured with: Tape Dental Injury: Teeth and Oropharynx as per pre-operative assessment

## 2022-11-29 NOTE — Anesthesia Procedure Notes (Signed)
Anesthesia Regional Block: Interscalene brachial plexus block   Pre-Anesthetic Checklist: , timeout performed,  Correct Patient, Correct Site, Correct Laterality,  Correct Procedure, Correct Position, site marked,  Risks and benefits discussed,  Surgical consent,  Pre-op evaluation,  At surgeon's request and post-op pain management  Laterality: Upper and Left  Prep: Maximum Sterile Barrier Precautions used, chloraprep       Needles:  Injection technique: Single-shot  Needle Type: Echogenic Needle     Needle Length: 5cm  Needle Gauge: 21     Additional Needles:   Procedures:,,,, ultrasound used (permanent image in chart),,    Narrative:  Start time: 11/29/2022 10:33 AM End time: 11/29/2022 10:40 AM Injection made incrementally with aspirations every 5 mL.  Performed by: Personally  Anesthesiologist: Barnet Glasgow, MD  Additional Notes: Block assessed prior to procedure. Patient tolerated procedure well.

## 2022-11-29 NOTE — Transfer of Care (Signed)
Immediate Anesthesia Transfer of Care Note  Patient: Kaylee Adams  Procedure(s) Performed: SHOULDER ARTHROSCOPY WITH SUBACROMIAL DECOMPRESSION, ROTATOR CUFF REPAIR AND BICEP TENDON REPAIR (Left) SHOULDER ARTHROSCOPY WITH DISTAL CLAVICLE EXCISION (Left: Shoulder)  Patient Location: PACU  Anesthesia Type:General and Regional  Level of Consciousness: drowsy  Airway & Oxygen Therapy: Patient Spontanous Breathing and Patient connected to face mask oxygen  Post-op Assessment: Report given to RN and Post -op Vital signs reviewed and stable  Post vital signs: Reviewed and stable  Last Vitals:  Vitals Value Taken Time  BP 150/63 11/29/22 1300  Temp 36.6 C 11/29/22 1300  Pulse 70 11/29/22 1306  Resp 12 11/29/22 1306  SpO2 100 % 11/29/22 1306  Vitals shown include unvalidated device data.  Last Pain:  Vitals:   11/29/22 0932  TempSrc: Oral  PainSc: 2       Patients Stated Pain Goal: 6 (61/68/37 2902)  Complications: No notable events documented.

## 2022-11-30 ENCOUNTER — Encounter (HOSPITAL_BASED_OUTPATIENT_CLINIC_OR_DEPARTMENT_OTHER): Payer: Self-pay | Admitting: Orthopaedic Surgery

## 2022-11-30 ENCOUNTER — Other Ambulatory Visit: Payer: Self-pay | Admitting: Family Medicine

## 2022-12-04 DIAGNOSIS — M6281 Muscle weakness (generalized): Secondary | ICD-10-CM | POA: Diagnosis not present

## 2022-12-04 DIAGNOSIS — M25612 Stiffness of left shoulder, not elsewhere classified: Secondary | ICD-10-CM | POA: Diagnosis not present

## 2022-12-04 DIAGNOSIS — S46012D Strain of muscle(s) and tendon(s) of the rotator cuff of left shoulder, subsequent encounter: Secondary | ICD-10-CM | POA: Diagnosis not present

## 2022-12-04 DIAGNOSIS — S46112D Strain of muscle, fascia and tendon of long head of biceps, left arm, subsequent encounter: Secondary | ICD-10-CM | POA: Diagnosis not present

## 2022-12-04 DIAGNOSIS — M7542 Impingement syndrome of left shoulder: Secondary | ICD-10-CM | POA: Diagnosis not present

## 2022-12-06 DIAGNOSIS — M25612 Stiffness of left shoulder, not elsewhere classified: Secondary | ICD-10-CM | POA: Diagnosis not present

## 2022-12-06 DIAGNOSIS — S46112D Strain of muscle, fascia and tendon of long head of biceps, left arm, subsequent encounter: Secondary | ICD-10-CM | POA: Diagnosis not present

## 2022-12-06 DIAGNOSIS — S46012D Strain of muscle(s) and tendon(s) of the rotator cuff of left shoulder, subsequent encounter: Secondary | ICD-10-CM | POA: Diagnosis not present

## 2022-12-06 DIAGNOSIS — M6281 Muscle weakness (generalized): Secondary | ICD-10-CM | POA: Diagnosis not present

## 2022-12-06 DIAGNOSIS — M7542 Impingement syndrome of left shoulder: Secondary | ICD-10-CM | POA: Diagnosis not present

## 2022-12-07 DIAGNOSIS — M7542 Impingement syndrome of left shoulder: Secondary | ICD-10-CM | POA: Diagnosis not present

## 2022-12-10 DIAGNOSIS — S46112D Strain of muscle, fascia and tendon of long head of biceps, left arm, subsequent encounter: Secondary | ICD-10-CM | POA: Diagnosis not present

## 2022-12-10 DIAGNOSIS — M7542 Impingement syndrome of left shoulder: Secondary | ICD-10-CM | POA: Diagnosis not present

## 2022-12-10 DIAGNOSIS — M6281 Muscle weakness (generalized): Secondary | ICD-10-CM | POA: Diagnosis not present

## 2022-12-10 DIAGNOSIS — S46012D Strain of muscle(s) and tendon(s) of the rotator cuff of left shoulder, subsequent encounter: Secondary | ICD-10-CM | POA: Diagnosis not present

## 2022-12-10 DIAGNOSIS — M25612 Stiffness of left shoulder, not elsewhere classified: Secondary | ICD-10-CM | POA: Diagnosis not present

## 2022-12-11 ENCOUNTER — Ambulatory Visit (INDEPENDENT_AMBULATORY_CARE_PROVIDER_SITE_OTHER): Payer: Medicare Other | Admitting: Family Medicine

## 2022-12-11 ENCOUNTER — Encounter: Payer: Self-pay | Admitting: Family Medicine

## 2022-12-11 VITALS — BP 132/60 | HR 86 | Temp 98.4°F | Ht 61.0 in | Wt 167.2 lb

## 2022-12-11 DIAGNOSIS — R5383 Other fatigue: Secondary | ICD-10-CM | POA: Diagnosis not present

## 2022-12-11 DIAGNOSIS — E559 Vitamin D deficiency, unspecified: Secondary | ICD-10-CM | POA: Diagnosis not present

## 2022-12-11 DIAGNOSIS — E876 Hypokalemia: Secondary | ICD-10-CM

## 2022-12-11 DIAGNOSIS — E162 Hypoglycemia, unspecified: Secondary | ICD-10-CM

## 2022-12-11 LAB — VITAMIN B12: Vitamin B-12: 380 pg/mL (ref 211–911)

## 2022-12-11 LAB — BASIC METABOLIC PANEL
BUN: 22 mg/dL (ref 6–23)
CO2: 26 mEq/L (ref 19–32)
Calcium: 11 mg/dL — ABNORMAL HIGH (ref 8.4–10.5)
Chloride: 105 mEq/L (ref 96–112)
Creatinine, Ser: 0.82 mg/dL (ref 0.40–1.20)
GFR: 72.22 mL/min (ref >60.00)
Glucose, Bld: 85 mg/dL (ref 70–99)
Potassium: 3.5 mEq/L (ref 3.5–5.1)
Sodium: 142 mEq/L (ref 135–145)

## 2022-12-11 LAB — VITAMIN D 25 HYDROXY (VIT D DEFICIENCY, FRACTURES): VITD: 39.8 ng/mL (ref 30.00–100.00)

## 2022-12-11 MED ORDER — BLOOD GLUCOSE MONITOR KIT: PACK | 0 refills | Status: DC

## 2022-12-11 NOTE — Progress Notes (Signed)
Established Patient Office Visit  Subjective   Patient ID: Kaylee Adams, female    DOB: 1951/12/16  Age: 71 y.o. MRN: [id number]  Chief Complaint  Patient presents with   Follow-up    Patient requests to discuss pre-op lab work ordered by surgeon on 12/5    Patient is here to discuss her preop bloodwork that she had done earlier in the month before her shoulder surgery. States that her glucose and potassium were low and she was wondering what could be causing this. Patient is also reporting that she sometimes is experiencing extreme fatigue and feels very sluggish at times, no dizziness, no lightheadedness/presyncope symptoms. States she eats a regular diet. Also reports feeling that sometimes she is not getting enough air into her lungs, is taking an antihistamine and PPI to control her acid reflux. She does not have a history of lung issues or COPD. Nonsmoker.  I reviewed the BMP with the patient and discussed the low glucose and the low potassium. I reviewed her medication list and she is taking HCTZ 25 mg for her BP which could potentially cause the low potassium, I reviewed her previous bloodwork and usually her potassium is WNL, it could also represent a lab error.     Current Outpatient Medications  Medication Instructions   acetaminophen (TYLENOL) 1,000 mg, Oral, Every 8 hours   amLODipine (NORVASC) 10 MG tablet TAKE 1 TABLET BY MOUTH EVERY DAY   aspirin 81 MG chewable tablet Oral, Daily   blood glucose meter kit and supplies KIT Dispense based on patient and insurance preference. Use up to four times daily as directed.   celecoxib (CELEBREX) 100 mg, Oral, 2 times daily, For 2 weeks. Then take as needed   Cyanocobalamin (VITAMIN B-12 PO) 5,000 mcg, Oral, Liquid    gabapentin (NEURONTIN) 200 mg, Oral, Daily at bedtime   losartan-hydrochlorothiazide (HYZAAR) 100-25 MG tablet TAKE 1 TABLET BY MOUTH EVERY DAY   MAGNESIUM PO Oral, Daily   MULTIPLE VITAMIN PO 1 capsule, Oral, Daily    omeprazole (PRILOSEC OTC) 20 mg, Oral, Daily   Vitamin D (Ergocalciferol) (DRISDOL) 50,000 Units, Oral, Every 7 days    Patient Active Problem List   Diagnosis Date Noted   Degenerative arthritis of left knee 10/02/2022   Acromioclavicular pain 08/08/2022   Partial tear of left rotator cuff 03/15/2022   Pes cavus 01/29/2021   DDD (degenerative disc disease), cervical 01/29/2021   DDD (degenerative disc disease), lumbar 01/29/2021   Vitamin D deficiency 01/29/2021   Atrophy of muscle of right lower leg 07/29/2020   Peroneal cyst, right 06/16/2020   Degenerative joint disease of knee, right 10/13/2019   Right hip pain 10/13/2019   Essential hypertension 04/13/2015   Hoarseness 04/13/2015   Low TSH level 07/30/2014   Neck discomfort 07/30/2014   Goiter ? right  07/30/2014   Trouble swallowing hx of  07/30/2014   Visit for preventive health examination 07/14/2014   GERD (gastroesophageal reflux disease) 01/01/2014   Hypokalemia 12/18/2013   Complete tear of right rotator cuff 11/03/2013   Right shoulder pain 06/30/2013   Greater trochanteric bursitis of left hip 05/08/2012   Skin cyst 07/16/2011   Cyst 07/16/2011   HEPATIC CYST 09/12/2009   BACK PAIN, THORACIC REGION, LEFT 03/28/2009   CAD, NATIVE VESSEL 12/26/2008   CHEST PAIN, ATYPICAL 12/02/2008   INTERNAL HEMORRHOIDS 01/09/2008   EXTERNAL HEMORRHOIDS 01/09/2008   LIPOMA NOS 09/16/2007   Hyperlipemia 09/16/2007      Review of Systems  All other systems reviewed and are negative.     Objective:     BP 132/60 (BP Location: Right Arm, Patient Position: Sitting, Cuff Size: Normal)   Pulse 86   Temp 98.4 F (36.9 C) (Oral)   Ht _0  (1.549 m)   Wt 167 lb 3.2 oz (75.8 kg)   SpO2 98%   BMI 31.59 kg/m    Physical Exam Vitals reviewed.  Constitutional:      Appearance: Normal appearance. She is obese.  Eyes:     Conjunctiva/sclera: Conjunctivae normal.  Pulmonary:     Effort: Pulmonary effort is normal.   Musculoskeletal:     Right lower leg: No edema.     Left lower leg: No edema.  Neurological:     Mental Status: She is alert and oriented to person, place, and time. Mental status is at baseline.  Psychiatric:        Mood and Affect: Mood normal.        Behavior: Behavior normal.        Judgment: Judgment normal.      No results found for any visits on 12/11/22.  Last metabolic panel Lab Results  Component Value Date   GLUCOSE 66 (L) 11/27/2022   NA 143 11/27/2022   K 3.3 (L) 11/27/2022   CL 109 11/27/2022   CO2 24 11/27/2022   BUN 15 11/27/2022   CREATININE 0.91 11/27/2022   GFRNONAA >60 11/27/2022   CALCIUM 10.1 11/27/2022   PROT 6.9 05/01/2022   ALBUMIN 4.1 05/01/2022   BILITOT 0.4 05/01/2022   ALKPHOS 56 05/01/2022   AST 19 05/01/2022   ALT 18 05/01/2022   ANIONGAP 10 11/27/2022      The 10-year ASCVD risk score (Arnett DK, et al., 2019) is: 14.2%    Assessment & Plan:   Problem List Items Addressed This Visit       Unprioritized   Hypokalemia   Relevant Orders   3.3 which is slightly low, I recommended eating potassium rich food such as bananas and raisins and will recheck her level in 1 month, medication changes are not recommended at this time.  Basic Metabolic Panel   Vitamin D deficiency   Relevant Orders   History of, she is on supplements once weekly, will recheck the level today Vitamin D, 25-hydroxy   Other Visit Diagnoses     Hypoglycemia    -  Primary   Relevant Medications   A1C 7 months ago was 5.6 which is normal, I will order the patient a glucometer for her to check her sugars at home and keep a record of this. She does report fatigue but no other symptoms of hypoglycemia.  blood glucose meter kit and supplies KIT   Other Relevant Orders   Basic Metabolic Panel   Other fatigue       Relevant Orders   Pt is reporting sluggishness and fatigue at times, will recheck B12 level today also.   Vitamin B12   Vitamin D, 25-hydroxy        No follow-ups on file.    Farrel Conners, MD

## 2022-12-11 NOTE — Patient Instructions (Signed)
Add raisins/ bananas at home to help support postassium levels.

## 2022-12-12 DIAGNOSIS — S46112D Strain of muscle, fascia and tendon of long head of biceps, left arm, subsequent encounter: Secondary | ICD-10-CM | POA: Diagnosis not present

## 2022-12-12 DIAGNOSIS — M7542 Impingement syndrome of left shoulder: Secondary | ICD-10-CM | POA: Diagnosis not present

## 2022-12-12 DIAGNOSIS — M6281 Muscle weakness (generalized): Secondary | ICD-10-CM | POA: Diagnosis not present

## 2022-12-12 DIAGNOSIS — S46012D Strain of muscle(s) and tendon(s) of the rotator cuff of left shoulder, subsequent encounter: Secondary | ICD-10-CM | POA: Diagnosis not present

## 2022-12-12 DIAGNOSIS — M25612 Stiffness of left shoulder, not elsewhere classified: Secondary | ICD-10-CM | POA: Diagnosis not present

## 2022-12-13 ENCOUNTER — Telehealth: Payer: Self-pay | Admitting: Internal Medicine

## 2022-12-13 NOTE — Telephone Encounter (Signed)
Patient requesting a call to discuss results of her last labs

## 2022-12-14 NOTE — Telephone Encounter (Signed)
I think I already addressed this in the result note.

## 2022-12-14 NOTE — Telephone Encounter (Signed)
See results note. 

## 2022-12-19 NOTE — Progress Notes (Signed)
[place] Phone: [phone] Subjective:   [name], am serving as a scribe for Dr. Hulan Saas.  I'm seeing this patient by the request  of:  Panosh, Standley Brooking, MD  CC: Multiple complaint follow-up  BXI:[id number]  11/20/2022 Known arthritic changes again.  Patient states that she is stable at the moment.  Would like to hold off.  Patient though because of the antalgic gait and having difficulty we will give her a handicap spot.  With patient having difficulty with ambulation was due to social determinants of health as well.  Patient does have some difficulty with transportation as well.           Chronic problem but stable at the moment.  Did discuss great amount of time.  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  Has responded well to the gabapentin and encouraged her to take 200 mg at night and can add Tylenol if necessary.  Worsening symptoms we would consider injection but patient would like to hold at this time.  Given a handout again to discuss home exercises.  Follow-up again in 6 to 8 weeks total time reviewing patient's imaging as well as discussing with patient interaction with other providers 31 minutes.     Patient has not responded as well. Has been referred to orthopedic surgery to discuss the possibility of acromioplasty. Still think that this could be a potentially good idea for her with patient still having difficulty with daily activities such as even sleep. Patient has failed all other conservative therapy including formal physical therapy, home exercises, icing regimen, and multiple injections.   Update 12/26/2022 LINNETTE PANELLA is a 71 y.o. female coming in with complaint of L hip, L knee and L shoulder pain. Patient states that she had surgery on shoulder on 12/9. Hip and knee pain are intermittent. Pain over lateral aspect of hip. Also has pain in lateral aspect of lower leg, L side.        Past Medical History:  Diagnosis Date   Allergy    occ takes otc allergy pill   Arthritis    CAD (coronary artery disease)    a. 11/2005 Cath: nonobs dzs, 30% LAD lesion on cath ;  b. 11/2005 Echo: NL EF;  c. 04/2012 Echo: EF 55-60%, Gr 2 DD;  d. 04/2012 Ex MV: EF 72%, ST depression in recovery with NL perfusion imaging - HTN response to exercise.   Chest pressure "fatigue" 05/08/2012   GERD (gastroesophageal reflux disease)    occ prilosec OTC   Hepatic cyst    on ct [place]   HLD (hyperlipidemia)    HTN (hypertension)    Midsternal chest pain    cardiology eval with non-ischemic stress test 04/2012   Upper airway cough syndrome 04/29/2015   Trial of max gerd rx 04/29/2015 >>>     Past Surgical History:  Procedure Laterality Date   ABDOMINAL HYSTERECTOMY     fibroid tumors   BREAST BIOPSY Right    BREAST EXCISIONAL BIOPSY Right    child birth x 5     CHOLECYSTECTOMY     COLONOSCOPY  01/09/2008   SHOULDER ARTHROSCOPY WITH DISTAL CLAVICLE RESECTION Left 11/29/2022   Procedure: SHOULDER ARTHROSCOPY WITH DISTAL CLAVICLE EXCISION;  Surgeon: Hiram Gash, MD;  Location: [facility];  Service: Orthopedics;  Laterality: Left;   SHOULDER ARTHROSCOPY WITH ROTATOR CUFF REPAIR AND SUBACROMIAL DECOMPRESSION Right 11/03/2013  Procedure: RIGHT SHOULDER ARTHROSCOPY WITH DEBRIDEMENT, ROTATOR CUFF REPAIR AND SUBACROMIAL DECOMPRESSION, DISTAL CLAVICLE RESECTION;  Surgeon: Mcarthur Rossetti, MD;  Location: [facility];  Service: Orthopedics;  Laterality: Right;   SHOULDER ARTHROSCOPY WITH SUBACROMIAL DECOMPRESSION, ROTATOR CUFF REPAIR AND BICEP TENDON REPAIR Left 11/29/2022   Procedure: SHOULDER ARTHROSCOPY WITH SUBACROMIAL DECOMPRESSION, ROTATOR CUFF REPAIR AND BICEP TENDON REPAIR;  Surgeon: Hiram Gash, MD;  Location: [facility];  Service: Orthopedics;  Laterality: Left;   VESICOVAGINAL FISTULA CLOSURE W/ TAH     for fibroid tumors   Social History   Socioeconomic  History   Marital status: Married    Spouse name: Not on file   Number of children: 5   Years of education: Not on file   Highest education level: Not on file  Occupational History    Employer: [employer]  Tobacco Use   Smoking status: Never   Smokeless tobacco: Never  Vaping Use   Vaping Use: Never used  Substance and Sexual Activity   Alcohol use: No   Drug use: No   Sexual activity: Not Currently    Birth control/protection: Surgical    Comment: Hyst  Other Topics Concern   Not on file  Social History Narrative   Married, full time Network engineer. 3 pets    [place]aker   6-8 hours sleep    Right Handed   Lives in a two story home.    Drinks 1 cup of Coffee   Social Determinants of Health   Financial Resource Strain: Low Risk  (08/16/2021)   Overall Financial Resource Strain (CARDIA)    Difficulty of Paying Living Expenses: Not hard at all  Food Insecurity: No Food Insecurity (08/02/2020)   Hunger Vital Sign    Worried About Running Out of Food in the Last Year: Never true    Ran Out of Food in the Last Year: Never true  Transportation Needs: No Transportation Needs (08/16/2021)   PRAPARE - Hydrologist (Medical): No    Lack of Transportation (Non-Medical): No  Physical Activity: Insufficiently Active (08/02/2020)   Exercise Vital Sign    Days of Exercise per Week: 3 days    Minutes of Exercise per Session: 30 min  Stress: Stress Concern Present (08/02/2020)   [place]    Feeling of Stress : To some extent  Social Connections: Moderately Integrated (08/02/2020)   Social Connection and Isolation Panel [NHANES]    Frequency of Communication with Friends and Family: More than three times a week    Frequency of Social Gatherings with Friends and Family: Three times a week    Attends Religious Services: Never    Active Member of Clubs or Organizations: Yes    Attends Programme researcher, broadcasting/film/video: More than 4 times per year    Marital Status: Married   Allergies  Allergen Reactions   Lisinopril     REACTION: cough   Penicillins Nausea Only   Family History  Problem Relation Age of Onset   Breast cancer Sister    Thyroid disease Sister    Hypertension Mother    Colon cancer Brother 56       dx'd about age 69 per pt-    Prostate cancer Brother    Lung cancer Brother    Liver cancer Brother    ALS Brother    Healthy Daughter    Healthy Daughter    Healthy Son  Healthy Son    Healthy Son    Colon polyps Neg Hx    Esophageal cancer Neg Hx    Rectal cancer Neg Hx    Stomach cancer Neg Hx      Current Outpatient Medications (Cardiovascular):    amLODipine (NORVASC) 10 MG tablet, TAKE 1 TABLET BY MOUTH EVERY DAY   losartan-hydrochlorothiazide (HYZAAR) 100-25 MG tablet, TAKE 1 TABLET BY MOUTH EVERY DAY   Current Outpatient Medications (Analgesics):    aspirin 81 MG chewable tablet, Chew by mouth daily.   celecoxib (CELEBREX) 100 MG capsule, Take 1 capsule (100 mg total) by mouth 2 (two) times daily. For 2 weeks. Then take as needed  Current Outpatient Medications (Hematological):    Cyanocobalamin (VITAMIN B-12 PO), Take 5,000 mcg by mouth. Liquid  Current Outpatient Medications (Other):    blood glucose meter kit and supplies KIT, Dispense based on patient and insurance preference. Use up to four times daily as directed.   gabapentin (NEURONTIN) 100 MG capsule, TAKE 2 CAPSULES BY MOUTH AT BEDTIME.   MAGNESIUM PO, Take by mouth daily.   MULTIPLE VITAMIN PO, Take 1 capsule by mouth daily.   omeprazole (PRILOSEC OTC) 20 MG tablet, Take 20 mg by mouth daily.   Vitamin D, Ergocalciferol, (DRISDOL) 1.25 MG (50000 UNIT) CAPS capsule, Take 1 capsule (50,000 Units total) by mouth every 7 (seven) days.   Reviewed prior external information including notes and imaging from  primary care provider As well as notes that were available from care  everywhere and other healthcare systems.  Past medical history, social, surgical and family history all reviewed in electronic medical record.  No pertanent information unless stated regarding to the chief complaint.   Review of Systems:  No headache, visual changes, nausea, vomiting, diarrhea, constipation, dizziness, abdominal pain, skin rash, fevers, chills, night sweats, weight loss, swollen lymph nodes, joint swelling, chest pain, shortness of breath, mood changes. POSITIVE muscle aches, body aches   Objective  Blood pressure 132/74, pulse 80, height _0  (1.549 m), weight 166 lb (75.3 kg), SpO2 98 %.   General: No apparent distress alert and oriented x3 mood and affect normal, dressed appropriately.  HEENT: Pupils equal, extraocular movements intact  Respiratory: Patient's speak in full sentences and does not appear short of breath  Cardiovascular: No lower extremity edema, non tender, no erythema  Left leg still has some mild tenderness to palpation over the greater trochanteric area.  Patient is sitting comfortably.  Deferred most of the exam secondary to recent surgery on the left shoulder.    Impression and Recommendations:    The above documentation has been reviewed and is accurate and complete Lyndal Pulley, DO

## 2022-12-20 DIAGNOSIS — M6281 Muscle weakness (generalized): Secondary | ICD-10-CM | POA: Diagnosis not present

## 2022-12-20 DIAGNOSIS — M7542 Impingement syndrome of left shoulder: Secondary | ICD-10-CM | POA: Diagnosis not present

## 2022-12-20 DIAGNOSIS — S46112D Strain of muscle, fascia and tendon of long head of biceps, left arm, subsequent encounter: Secondary | ICD-10-CM | POA: Diagnosis not present

## 2022-12-20 DIAGNOSIS — M25612 Stiffness of left shoulder, not elsewhere classified: Secondary | ICD-10-CM | POA: Diagnosis not present

## 2022-12-20 DIAGNOSIS — S46012D Strain of muscle(s) and tendon(s) of the rotator cuff of left shoulder, subsequent encounter: Secondary | ICD-10-CM | POA: Diagnosis not present

## 2022-12-25 DIAGNOSIS — M6281 Muscle weakness (generalized): Secondary | ICD-10-CM | POA: Diagnosis not present

## 2022-12-25 DIAGNOSIS — S46112D Strain of muscle, fascia and tendon of long head of biceps, left arm, subsequent encounter: Secondary | ICD-10-CM | POA: Diagnosis not present

## 2022-12-25 DIAGNOSIS — M7542 Impingement syndrome of left shoulder: Secondary | ICD-10-CM | POA: Diagnosis not present

## 2022-12-25 DIAGNOSIS — M25612 Stiffness of left shoulder, not elsewhere classified: Secondary | ICD-10-CM | POA: Diagnosis not present

## 2022-12-25 DIAGNOSIS — S46012D Strain of muscle(s) and tendon(s) of the rotator cuff of left shoulder, subsequent encounter: Secondary | ICD-10-CM | POA: Diagnosis not present

## 2022-12-26 ENCOUNTER — Ambulatory Visit (INDEPENDENT_AMBULATORY_CARE_PROVIDER_SITE_OTHER): Payer: Medicare Other | Admitting: Family Medicine

## 2022-12-26 ENCOUNTER — Ambulatory Visit: Payer: Self-pay

## 2022-12-26 VITALS — BP 132/74 | HR 80 | Ht 61.0 in | Wt 166.0 lb

## 2022-12-26 DIAGNOSIS — M75112 Incomplete rotator cuff tear or rupture of left shoulder, not specified as traumatic: Secondary | ICD-10-CM

## 2022-12-26 DIAGNOSIS — M7062 Trochanteric bursitis, left hip: Secondary | ICD-10-CM | POA: Diagnosis not present

## 2022-12-26 DIAGNOSIS — M25612 Stiffness of left shoulder, not elsewhere classified: Secondary | ICD-10-CM | POA: Diagnosis not present

## 2022-12-26 DIAGNOSIS — M2550 Pain in unspecified joint: Secondary | ICD-10-CM

## 2022-12-26 DIAGNOSIS — G8929 Other chronic pain: Secondary | ICD-10-CM

## 2022-12-26 DIAGNOSIS — S46112D Strain of muscle, fascia and tendon of long head of biceps, left arm, subsequent encounter: Secondary | ICD-10-CM | POA: Diagnosis not present

## 2022-12-26 DIAGNOSIS — S46012D Strain of muscle(s) and tendon(s) of the rotator cuff of left shoulder, subsequent encounter: Secondary | ICD-10-CM | POA: Diagnosis not present

## 2022-12-26 DIAGNOSIS — M25512 Pain in left shoulder: Secondary | ICD-10-CM | POA: Diagnosis not present

## 2022-12-26 DIAGNOSIS — M6281 Muscle weakness (generalized): Secondary | ICD-10-CM | POA: Diagnosis not present

## 2022-12-26 DIAGNOSIS — M62561 Muscle wasting and atrophy, not elsewhere classified, right lower leg: Secondary | ICD-10-CM

## 2022-12-26 DIAGNOSIS — M7542 Impingement syndrome of left shoulder: Secondary | ICD-10-CM | POA: Diagnosis not present

## 2022-12-26 LAB — COMPREHENSIVE METABOLIC PANEL
ALT: 18 U/L (ref 0–35)
AST: 20 U/L (ref 0–37)
Albumin: 4.4 g/dL (ref 3.5–5.2)
Alkaline Phosphatase: 62 U/L (ref 39–117)
BUN: 18 mg/dL (ref 6–23)
CO2: 29 mEq/L (ref 19–32)
Calcium: 11 mg/dL — ABNORMAL HIGH (ref 8.4–10.5)
Chloride: 103 mEq/L (ref 96–112)
Creatinine, Ser: 0.81 mg/dL (ref 0.40–1.20)
GFR: 73.27 mL/min (ref >60.00)
Glucose, Bld: 86 mg/dL (ref 70–99)
Potassium: 3.5 mEq/L (ref 3.5–5.1)
Sodium: 140 mEq/L (ref 135–145)
Total Bilirubin: 0.5 mg/dL (ref 0.2–1.2)
Total Protein: 7.4 g/dL (ref 6.0–8.3)

## 2022-12-26 LAB — IBC PANEL
Iron: 92 ug/dL (ref 42–145)
Saturation Ratios: 27.5 % (ref 20.0–50.0)
TIBC: 334.6 ug/dL (ref 250.0–450.0)
Transferrin: 239 mg/dL (ref 212.0–360.0)

## 2022-12-26 LAB — SEDIMENTATION RATE: Sed Rate: 29 mm/hr (ref 0–30)

## 2022-12-26 LAB — VITAMIN B12: Vitamin B-12: 358 pg/mL (ref 211–911)

## 2022-12-26 LAB — FERRITIN: Ferritin: 65.4 ng/mL (ref 10.0–291.0)

## 2022-12-26 NOTE — Assessment & Plan Note (Signed)
Patient is doing relatively well but does have more discomfort noted on the left leg secondary to multiple other conditions.  We discussed with patient to start increasing activity, patient has difficulty with weight loss we can refer otherwise.  Will consider metabolic medicine.  Discussed with patient about the degenerative arthritis of the knee as well as the greater trochanteric and right hip.  We discussed which activities to do and which ones to avoid.  Follow-up with me again 6 to 8 weeks.

## 2022-12-26 NOTE — Assessment & Plan Note (Signed)
Postsurgical and is being followed by orthopedics.  Discussed with patient again at great length about this and her other problems.  Patient has had hypercalcemia.  Will recheck labs.  Total time with patient 31 minutes

## 2022-12-26 NOTE — Assessment & Plan Note (Signed)
Significant improvement after the injection.  We will continue to monitor.  Discussed the hip abductor strengthening but patient has not been able to do it as much secondary to recent surgical intervention.  Follow-up with me again in 6 to 8 weeks just to check in.

## 2022-12-26 NOTE — Assessment & Plan Note (Signed)
Patient continues to have hypercalcemia and does have a chronic discomfort and pain.  Will check to make sure that this is not progressing up and get a PTH as well

## 2022-12-26 NOTE — Patient Instructions (Addendum)
Dr. Raoul Pitch Labs today B complex with 1028mg of B12, '100mg'$  of B6 Vit C [phone] daily See me in 2 months

## 2022-12-28 LAB — PTH, INTACT AND CALCIUM
Calcium: 10.9 mg/dL — ABNORMAL HIGH (ref 8.6–10.4)
PTH: 45 pg/mL (ref 16–77)

## 2022-12-28 LAB — CALCIUM, IONIZED: Calcium, Ion: 5.7 mg/dL — ABNORMAL HIGH (ref 4.7–5.5)

## 2022-12-31 DIAGNOSIS — S46012D Strain of muscle(s) and tendon(s) of the rotator cuff of left shoulder, subsequent encounter: Secondary | ICD-10-CM | POA: Diagnosis not present

## 2022-12-31 DIAGNOSIS — M7542 Impingement syndrome of left shoulder: Secondary | ICD-10-CM | POA: Diagnosis not present

## 2022-12-31 DIAGNOSIS — S46112D Strain of muscle, fascia and tendon of long head of biceps, left arm, subsequent encounter: Secondary | ICD-10-CM | POA: Diagnosis not present

## 2022-12-31 DIAGNOSIS — M6281 Muscle weakness (generalized): Secondary | ICD-10-CM | POA: Diagnosis not present

## 2022-12-31 DIAGNOSIS — M25612 Stiffness of left shoulder, not elsewhere classified: Secondary | ICD-10-CM | POA: Diagnosis not present

## 2023-01-03 DIAGNOSIS — S46112D Strain of muscle, fascia and tendon of long head of biceps, left arm, subsequent encounter: Secondary | ICD-10-CM | POA: Diagnosis not present

## 2023-01-03 DIAGNOSIS — M7542 Impingement syndrome of left shoulder: Secondary | ICD-10-CM | POA: Diagnosis not present

## 2023-01-03 DIAGNOSIS — M25612 Stiffness of left shoulder, not elsewhere classified: Secondary | ICD-10-CM | POA: Diagnosis not present

## 2023-01-03 DIAGNOSIS — S46012D Strain of muscle(s) and tendon(s) of the rotator cuff of left shoulder, subsequent encounter: Secondary | ICD-10-CM | POA: Diagnosis not present

## 2023-01-03 DIAGNOSIS — M6281 Muscle weakness (generalized): Secondary | ICD-10-CM | POA: Diagnosis not present

## 2023-01-10 DIAGNOSIS — M25612 Stiffness of left shoulder, not elsewhere classified: Secondary | ICD-10-CM | POA: Diagnosis not present

## 2023-01-10 DIAGNOSIS — M7542 Impingement syndrome of left shoulder: Secondary | ICD-10-CM | POA: Diagnosis not present

## 2023-01-10 DIAGNOSIS — M6281 Muscle weakness (generalized): Secondary | ICD-10-CM | POA: Diagnosis not present

## 2023-01-10 DIAGNOSIS — S46112D Strain of muscle, fascia and tendon of long head of biceps, left arm, subsequent encounter: Secondary | ICD-10-CM | POA: Diagnosis not present

## 2023-01-10 DIAGNOSIS — S46012D Strain of muscle(s) and tendon(s) of the rotator cuff of left shoulder, subsequent encounter: Secondary | ICD-10-CM | POA: Diagnosis not present

## 2023-01-11 ENCOUNTER — Other Ambulatory Visit: Payer: Medicare Other

## 2023-01-11 DIAGNOSIS — M7542 Impingement syndrome of left shoulder: Secondary | ICD-10-CM | POA: Diagnosis not present

## 2023-01-17 DIAGNOSIS — M6281 Muscle weakness (generalized): Secondary | ICD-10-CM | POA: Diagnosis not present

## 2023-01-17 DIAGNOSIS — S46112D Strain of muscle, fascia and tendon of long head of biceps, left arm, subsequent encounter: Secondary | ICD-10-CM | POA: Diagnosis not present

## 2023-01-17 DIAGNOSIS — M25612 Stiffness of left shoulder, not elsewhere classified: Secondary | ICD-10-CM | POA: Diagnosis not present

## 2023-01-17 DIAGNOSIS — S46012D Strain of muscle(s) and tendon(s) of the rotator cuff of left shoulder, subsequent encounter: Secondary | ICD-10-CM | POA: Diagnosis not present

## 2023-01-17 DIAGNOSIS — M7542 Impingement syndrome of left shoulder: Secondary | ICD-10-CM | POA: Diagnosis not present

## 2023-01-22 DIAGNOSIS — S46112D Strain of muscle, fascia and tendon of long head of biceps, left arm, subsequent encounter: Secondary | ICD-10-CM | POA: Diagnosis not present

## 2023-01-22 DIAGNOSIS — S46012D Strain of muscle(s) and tendon(s) of the rotator cuff of left shoulder, subsequent encounter: Secondary | ICD-10-CM | POA: Diagnosis not present

## 2023-01-22 DIAGNOSIS — M7542 Impingement syndrome of left shoulder: Secondary | ICD-10-CM | POA: Diagnosis not present

## 2023-01-22 DIAGNOSIS — M25612 Stiffness of left shoulder, not elsewhere classified: Secondary | ICD-10-CM | POA: Diagnosis not present

## 2023-01-22 DIAGNOSIS — M6281 Muscle weakness (generalized): Secondary | ICD-10-CM | POA: Diagnosis not present

## 2023-01-29 DIAGNOSIS — S46012D Strain of muscle(s) and tendon(s) of the rotator cuff of left shoulder, subsequent encounter: Secondary | ICD-10-CM | POA: Diagnosis not present

## 2023-01-29 DIAGNOSIS — M6281 Muscle weakness (generalized): Secondary | ICD-10-CM | POA: Diagnosis not present

## 2023-01-29 DIAGNOSIS — M25612 Stiffness of left shoulder, not elsewhere classified: Secondary | ICD-10-CM | POA: Diagnosis not present

## 2023-01-29 DIAGNOSIS — M7542 Impingement syndrome of left shoulder: Secondary | ICD-10-CM | POA: Diagnosis not present

## 2023-01-29 DIAGNOSIS — S46112D Strain of muscle, fascia and tendon of long head of biceps, left arm, subsequent encounter: Secondary | ICD-10-CM | POA: Diagnosis not present

## 2023-02-05 DIAGNOSIS — S46012D Strain of muscle(s) and tendon(s) of the rotator cuff of left shoulder, subsequent encounter: Secondary | ICD-10-CM | POA: Diagnosis not present

## 2023-02-05 DIAGNOSIS — M6281 Muscle weakness (generalized): Secondary | ICD-10-CM | POA: Diagnosis not present

## 2023-02-05 DIAGNOSIS — M7542 Impingement syndrome of left shoulder: Secondary | ICD-10-CM | POA: Diagnosis not present

## 2023-02-05 DIAGNOSIS — M25612 Stiffness of left shoulder, not elsewhere classified: Secondary | ICD-10-CM | POA: Diagnosis not present

## 2023-02-05 DIAGNOSIS — S46112D Strain of muscle, fascia and tendon of long head of biceps, left arm, subsequent encounter: Secondary | ICD-10-CM | POA: Diagnosis not present

## 2023-02-09 ENCOUNTER — Emergency Department (HOSPITAL_COMMUNITY)
Admission: EM | Admit: 2023-02-09 | Discharge: 2023-02-10 | Disposition: A | Discharge status: Home / Self Care | Payer: Medicare Other | Attending: Emergency Medicine | Admitting: Emergency Medicine

## 2023-02-09 ENCOUNTER — Emergency Department (HOSPITAL_COMMUNITY): Payer: Medicare Other

## 2023-02-09 ENCOUNTER — Other Ambulatory Visit: Payer: Self-pay

## 2023-02-09 DIAGNOSIS — J029 Acute pharyngitis, unspecified: Secondary | ICD-10-CM | POA: Diagnosis not present

## 2023-02-09 DIAGNOSIS — Z20822 Contact with and (suspected) exposure to covid-19: Secondary | ICD-10-CM | POA: Diagnosis not present

## 2023-02-09 DIAGNOSIS — Z7982 Long term (current) use of aspirin: Secondary | ICD-10-CM | POA: Insufficient documentation

## 2023-02-09 DIAGNOSIS — R791 Abnormal coagulation profile: Secondary | ICD-10-CM | POA: Insufficient documentation

## 2023-02-09 DIAGNOSIS — R0602 Shortness of breath: Secondary | ICD-10-CM | POA: Diagnosis not present

## 2023-02-09 DIAGNOSIS — R059 Cough, unspecified: Secondary | ICD-10-CM | POA: Diagnosis not present

## 2023-02-09 DIAGNOSIS — E876 Hypokalemia: Secondary | ICD-10-CM | POA: Diagnosis not present

## 2023-02-09 DIAGNOSIS — Z79899 Other long term (current) drug therapy: Secondary | ICD-10-CM | POA: Diagnosis not present

## 2023-02-09 DIAGNOSIS — I10 Essential (primary) hypertension: Secondary | ICD-10-CM | POA: Diagnosis not present

## 2023-02-09 DIAGNOSIS — J40 Bronchitis, not specified as acute or chronic: Secondary | ICD-10-CM

## 2023-02-09 DIAGNOSIS — I700 Atherosclerosis of aorta: Secondary | ICD-10-CM | POA: Diagnosis not present

## 2023-02-09 LAB — CBC WITH DIFFERENTIAL/PLATELET
Abs Immature Granulocytes: 0.02 10*3/uL (ref 0.00–0.07)
Basophils Absolute: 0 10*3/uL (ref 0.0–0.1)
Basophils Relative: 0 %
Eosinophils Absolute: 0 10*3/uL (ref 0.0–0.5)
Eosinophils Relative: 0 %
HCT: 41.6 % (ref 36.0–46.0)
Hemoglobin: 13.2 g/dL (ref 12.0–15.0)
Immature Granulocytes: 0 %
Lymphocytes Relative: 36 %
Lymphs Abs: 2.6 10*3/uL (ref 0.7–4.0)
MCH: 25.5 pg — ABNORMAL LOW (ref 26.0–34.0)
MCHC: 31.7 g/dL (ref 30.0–36.0)
MCV: 80.3 fL (ref 80.0–100.0)
Monocytes Absolute: 0.6 10*3/uL (ref 0.1–1.0)
Monocytes Relative: 8 %
Neutro Abs: 4.1 10*3/uL (ref 1.7–7.7)
Neutrophils Relative %: 56 %
Platelets: 345 10*3/uL (ref 150–400)
RBC: 5.18 MIL/uL — ABNORMAL HIGH (ref 3.87–5.11)
RDW: 13.2 % (ref 11.5–15.5)
WBC: 7.4 10*3/uL (ref 4.0–10.5)
nRBC: 0 % (ref 0.0–0.2)

## 2023-02-09 LAB — RESP PANEL BY RT-PCR (RSV, FLU A&B, COVID)  RVPGX2
Influenza A by PCR: NEGATIVE
Influenza B by PCR: NEGATIVE
Resp Syncytial Virus by PCR: NEGATIVE
SARS Coronavirus 2 by RT PCR: NEGATIVE

## 2023-02-09 LAB — BASIC METABOLIC PANEL
Anion gap: 9 (ref 5–15)
BUN: 15 mg/dL (ref 8–23)
CO2: 26 mmol/L (ref 22–32)
Calcium: 9.9 mg/dL (ref 8.9–10.3)
Chloride: 99 mmol/L (ref 98–111)
Creatinine, Ser: 0.89 mg/dL (ref 0.44–1.00)
GFR, Estimated: >60 mL/min (ref >60)
Glucose, Bld: 93 mg/dL (ref 70–99)
Potassium: 3 mmol/L — ABNORMAL LOW (ref 3.5–5.1)
Sodium: 134 mmol/L — ABNORMAL LOW (ref 135–145)

## 2023-02-09 LAB — BRAIN NATRIURETIC PEPTIDE: B Natriuretic Peptide: 31.3 pg/mL (ref 0.0–100.0)

## 2023-02-09 MED ORDER — POTASSIUM CHLORIDE CRYS ER 20 MEQ PO TBCR
40.0000 meq | EXTENDED_RELEASE_TABLET | Freq: Once | ORAL | Status: AC
  Administered 2023-02-09: 40 meq via ORAL
  Filled 2023-02-09: qty 2

## 2023-02-09 NOTE — ED Triage Notes (Signed)
Pt c/o SOB, dry non-productive cough, and sore throat that started Tuesday. No chest pain.

## 2023-02-09 NOTE — ED Provider Notes (Signed)
[facility] Provider Note   CSN: [id number] Arrival date & time: 02/09/23  2049     History {Add pertinent medical, surgical, social history, OB history to HPI:1} Chief Complaint  Patient presents with   Shortness of Breath    Kaylee Adams is a 72 y.o. female.  Patient with history of hypertension, hyperlipidemia, acid reflux disease presenting with a 5-day history of dry cough, sore throat, "discomfort in my chest" but denies chest pain.  States she has had a dry cough as well as hoarse voice and congestion and sore throat for the past 5 days.  Using over-the-counter remedies without relief.  Feels "discomfort in my chest" that is worse at night the last for several hours at a time but denies chest pain.  She believes it is coming from her throat.  Denies any cardiac history.  Denies any history of MI.  Denies any abdominal pain, nausea, vomiting, fever.  No travel or sick contacts.  Good p.o. intake and urine output.  No leg pain or leg swelling.  No urinary symptoms.  No significant headache.  The history is provided by the patient.  Shortness of Breath Associated symptoms: sore throat   Associated symptoms: no abdominal pain, no chest pain, no fever, no headaches, no rash and no vomiting        Home Medications Prior to Admission medications   Medication Sig Start Date End Date Taking? Authorizing Provider  amLODipine (NORVASC) 10 MG tablet TAKE 1 TABLET BY MOUTH EVERY DAY 06/27/22   Panosh, Standley Brooking, MD  aspirin 81 MG chewable tablet Chew by mouth daily.    [provider]  blood glucose meter kit and supplies KIT Dispense based on patient and insurance preference. Use up to four times daily as directed. 12/11/22   Farrel Conners, MD  Cyanocobalamin (VITAMIN B-12 PO) Take 5,000 mcg by mouth. Liquid    [provider]  gabapentin (NEURONTIN) 100 MG capsule TAKE 2 CAPSULES BY MOUTH AT BEDTIME. 12/04/22   Lyndal Pulley, DO  losartan-hydrochlorothiazide (HYZAAR) 100-25 MG tablet TAKE 1 TABLET BY MOUTH EVERY DAY 09/24/22   Panosh, Standley Brooking, MD  MAGNESIUM PO Take by mouth daily.    [provider]  MULTIPLE VITAMIN PO Take 1 capsule by mouth daily.    [provider]  omeprazole (PRILOSEC OTC) 20 MG tablet Take 20 mg by mouth daily.    [provider]  Vitamin D, Ergocalciferol, (DRISDOL) 1.25 MG (50000 UNIT) CAPS capsule Take 1 capsule (50,000 Units total) by mouth every 7 (seven) days. 09/28/22   Lyndal Pulley, DO      Allergies    Lisinopril and Penicillins    Review of Systems   Review of Systems  Constitutional:  Positive for fatigue. Negative for activity change, appetite change and fever.  HENT:  Positive for congestion, rhinorrhea and sore throat.   Respiratory:  Positive for shortness of breath.   Cardiovascular:  Negative for chest pain and leg swelling.  Gastrointestinal:  Negative for abdominal pain, nausea and vomiting.  Genitourinary:  Negative for dysuria, flank pain and hematuria.  Musculoskeletal:  Positive for arthralgias and myalgias.  Skin:  Negative for rash.  Neurological:  Positive for weakness. Negative for dizziness, light-headedness and headaches.   all other systems are negative except as noted in the HPI and PMH.    Physical Exam Updated Vital Signs BP 136/76 (BP Location: Left Arm)   Pulse  100   Temp 98.8 F (37.1 C) (Oral)   Resp 19   Ht 5' 1"$  (1.549 m)   Wt 73 kg   SpO2 100%   BMI 30.42 kg/m  Physical Exam Vitals and nursing note reviewed.  Constitutional:      General: She is not in acute distress.    Appearance: She is well-developed.     Comments: No distress Hoarse voice  HENT:     Head: Normocephalic and atraumatic.     Right Ear: Tympanic membrane normal.     Left Ear: Tympanic membrane normal.     Nose: No congestion.     Mouth/Throat:     Pharynx: Posterior oropharyngeal erythema present. No oropharyngeal  exudate.  Eyes:     Conjunctiva/sclera: Conjunctivae normal.     Pupils: Pupils are equal, round, and reactive to light.  Neck:     Comments: No meningismus. Cardiovascular:     Rate and Rhythm: Normal rate and regular rhythm.     Heart sounds: Normal heart sounds. No murmur heard. Pulmonary:     Effort: Pulmonary effort is normal. No respiratory distress.     Breath sounds: Normal breath sounds.  Chest:     Chest wall: No tenderness.  Abdominal:     Palpations: Abdomen is soft.     Tenderness: There is no abdominal tenderness. There is no guarding or rebound.  Musculoskeletal:        General: No tenderness. Normal range of motion.     Cervical back: Normal range of motion and neck supple.  Skin:    General: Skin is warm.     Capillary Refill: Capillary refill takes less than 2 seconds.  Neurological:     General: No focal deficit present.     Mental Status: She is alert and oriented to person, place, and time. Mental status is at baseline.     Cranial Nerves: No cranial nerve deficit.     Motor: No abnormal muscle tone.     Coordination: Coordination normal.     Comments:  5/5 strength throughout. CN 2-12 intact.Equal grip strength.   Psychiatric:        Behavior: Behavior normal.     ED Results / Procedures / Treatments   Labs (all labs ordered are listed, but only abnormal results are displayed) Labs Reviewed  BASIC METABOLIC PANEL - Abnormal; Notable for the following components:      Result Value   Sodium 134 (*)    Potassium 3.0 (*)    All other components within normal limits  CBC WITH DIFFERENTIAL/PLATELET - Abnormal; Notable for the following components:   RBC 5.18 (*)    MCH 25.5 (*)    All other components within normal limits  RESP PANEL BY RT-PCR (RSV, FLU A&B, COVID)  [id number]  GROUP A STREP BY PCR  BRAIN NATRIURETIC PEPTIDE  D-DIMER, QUANTITATIVE  TROPONIN I (HIGH SENSITIVITY)    EKG None ED ECG REPORT   Date: 02/09/2023  Rate: 83  Rhythm:  normal sinus rhythm  QRS Axis: normal  Intervals: normal  ST/T Wave abnormalities: normal  Conduction Disutrbances:none  Narrative Interpretation:   Old EKG Reviewed: none available  I have personally reviewed the EKG tracing and agree with the computerized printout as noted.  Radiology DG Chest 2 View  Result Date: 02/09/2023 CLINICAL DATA:  Short of breath, cough, sore throat EXAM: CHEST - 2 VIEW COMPARISON:  06/25/2022 FINDINGS: The heart size and mediastinal contours are within normal limits. Both  lungs are clear. The visualized skeletal structures are unremarkable. IMPRESSION: No active cardiopulmonary disease. Electronically Signed   By: Randa Ngo M.D.   On: 02/09/2023 21:55    Procedures Procedures  {Document cardiac monitor, telemetry assessment procedure when appropriate:1}  Medications Ordered in ED Medications  potassium chloride SA (KLOR-CON M) CR tablet 40 mEq (has no administration in time range)    ED Course/ Medical Decision Making/ A&P   {   Click here for ABCD2, HEART and other calculatorsREFRESH Note before signing :1}                          Medical Decision Making Amount and/or Complexity of Data Reviewed Labs: ordered. Decision-making details documented in ED Course. Radiology: ordered and independent interpretation performed. Decision-making details documented in ED Course. ECG/medicine tests: ordered and independent interpretation performed. Decision-making details documented in ED Course.  Risk Prescription drug management.  5 days of sore throat, cough, shortness of breath and discomfort in chest.  No hypoxia or increased work of breathing, lungs are clear.  Chest x-ray obtained in triage is negative for pneumonia or infiltrate.  Results reviewed and interpreted by me.  {Document critical care time when appropriate:1} {Document review of labs and clinical decision tools ie heart score, Chads2Vasc2 etc:1}  {Document your independent review of  radiology images, and any outside records:1} {Document your discussion with family members, caretakers, and with consultants:1} {Document social determinants of health affecting pt's care:1} {Document your decision making why or why not admission, treatments were needed:1} Final Clinical Impression(s) / ED Diagnoses Final diagnoses:  None    Rx / DC Orders ED Discharge Orders     None

## 2023-02-09 NOTE — ED Provider Triage Note (Signed)
Emergency Medicine Provider Triage Evaluation Note  Kaylee Adams , a 72 y.o. female  was evaluated in triage.  Pt complains of cough, sore throat, and shortness of breath that began Wednesday.  Patient denies fevers, chest pain  Review of Systems  Positive: As above Negative: As above  Physical Exam  BP 136/76 (BP Location: Left Arm)   Pulse 100   Temp 98.8 F (37.1 C) (Oral)   Resp 19   Ht 5' 1"$  (1.549 m)   Wt 73 kg   SpO2 100%   BMI 30.42 kg/m  Gen:   Awake, no distress   Resp:  Normal effort  MSK:   Moves extremities without difficulty  Other:    Medical Decision Making  Medically screening exam initiated at 9:22 PM.  Appropriate orders placed.  [name] was informed that the remainder of the evaluation will be completed by another provider, this initial triage assessment does not replace that evaluation, and the importance of remaining in the ED until their evaluation is complete.     [name] 02/09/23 2123

## 2023-02-10 ENCOUNTER — Emergency Department (HOSPITAL_COMMUNITY): Payer: Medicare Other

## 2023-02-10 ENCOUNTER — Encounter (HOSPITAL_COMMUNITY): Payer: Self-pay

## 2023-02-10 DIAGNOSIS — I700 Atherosclerosis of aorta: Secondary | ICD-10-CM | POA: Diagnosis not present

## 2023-02-10 DIAGNOSIS — J40 Bronchitis, not specified as acute or chronic: Secondary | ICD-10-CM | POA: Diagnosis not present

## 2023-02-10 LAB — GROUP A STREP BY PCR: Group A Strep by PCR: NOT DETECTED

## 2023-02-10 LAB — D-DIMER, QUANTITATIVE: D-Dimer, Quant: 0.82 ug/mL-FEU — ABNORMAL HIGH (ref 0.00–0.50)

## 2023-02-10 LAB — TROPONIN I (HIGH SENSITIVITY)
Troponin I (High Sensitivity): 6 ng/L (ref <18)
Troponin I (High Sensitivity): 6 ng/L (ref <18)

## 2023-02-10 MED ORDER — ALBUTEROL SULFATE HFA 108 (90 BASE) MCG/ACT IN AERS: 1.0000 | INHALATION_SPRAY | Freq: Four times a day (QID) | RESPIRATORY_TRACT | 0 refills | Status: AC | PRN

## 2023-02-10 MED ORDER — IOHEXOL 350 MG/ML SOLN
75.0000 mL | Freq: Once | INTRAVENOUS | Status: AC | PRN
  Administered 2023-02-10: 75 mL via INTRAVENOUS

## 2023-02-10 MED ORDER — DOXYCYCLINE HYCLATE 100 MG PO CAPS: 100.0000 mg | ORAL_CAPSULE | Freq: Two times a day (BID) | ORAL | 0 refills | Status: DC

## 2023-02-10 NOTE — ED Notes (Signed)
Pulse oximetry assessed while ambulating. SpO2 remains 96% or greater on RA throughout with pulse increased to 110bpm during. Gait steady; pt denies dizziness throughout. Once assisted back to bed, pulse returned to 86bpm and 98%RA.

## 2023-02-10 NOTE — Discharge Instructions (Signed)
Your testing is reassuring.  Take the antibiotics as prescribed and use the albuterol as needed for difficulty breathing.  Keep your self hydrated.  Follow-up with your doctor.  Return to the ED if you develop difficulty breathing, chest pain, not able to eat or drink or other concerns.

## 2023-02-13 ENCOUNTER — Ambulatory Visit (INDEPENDENT_AMBULATORY_CARE_PROVIDER_SITE_OTHER): Payer: Medicare Other | Admitting: Internal Medicine

## 2023-02-13 ENCOUNTER — Other Ambulatory Visit: Payer: Self-pay | Admitting: Internal Medicine

## 2023-02-13 ENCOUNTER — Encounter: Payer: Self-pay | Admitting: Internal Medicine

## 2023-02-13 VITALS — BP 138/68 | HR 80 | Temp 98.3°F | Ht 61.0 in | Wt 165.0 lb

## 2023-02-13 DIAGNOSIS — I10 Essential (primary) hypertension: Secondary | ICD-10-CM

## 2023-02-13 DIAGNOSIS — E876 Hypokalemia: Secondary | ICD-10-CM | POA: Diagnosis not present

## 2023-02-13 DIAGNOSIS — M25552 Pain in left hip: Secondary | ICD-10-CM | POA: Diagnosis not present

## 2023-02-13 DIAGNOSIS — R059 Cough, unspecified: Secondary | ICD-10-CM

## 2023-02-13 NOTE — Progress Notes (Signed)
Chief Complaint  Patient presents with   Hip Pain    Pt c/o R hip pain radiates down to R leg. Going on 2 wks now. Pt states she sense the pain at night.    Cough   Follow-up    Pt is follow up from ED visit on 02/09/2023. Pt reports she is taking sudafed for cough. Feeling better.     HPI: Kaylee Adams 72 y.o. come in for FU ed visit for  dyspnea   with cough? dx as "bronchitisi)  on feb 17    neg ct for slightly elevated  d dimer net for pna or pe  3 mm nodule  old no concerning  Managed as  resp infection given doxycyxline  and albuterol  Since then  a lot better  took sudafed and getting better   Reports jhx of chest tightness and dyspnea with exposure to certain odors perfumes , chemical  kerosene heated air and others.  No sneezing runne nose with such .    Left hip pain Worse at night .  Left hip to   side of thigh and calf.  Level 3/10 no awakening  no change gait  no fever no injury.  In record remot hx of bursitis and infection left hip  Shot in hip  area  6 mos  ago   ROS: See pertinent positives and negatives per HPI.  Past Medical History:  Diagnosis Date   Allergy    occ takes otc allergy pill   Arthritis    CAD (coronary artery disease)    a. 11/2005 Cath: nonobs dzs, 30% LAD lesion on cath ;  b. 11/2005 Echo: NL EF;  c. 04/2012 Echo: EF 55-60%, Gr 2 DD;  d. 04/2012 Ex MV: EF 72%, ST depression in recovery with NL perfusion imaging - HTN response to exercise.   Chest pressure "fatigue" 05/08/2012   GERD (gastroesophageal reflux disease)    occ prilosec OTC   Hepatic cyst    on ct [place]   HLD (hyperlipidemia)    HTN (hypertension)    Midsternal chest pain    cardiology eval with non-ischemic stress test 04/2012   Upper airway cough syndrome 04/29/2015   Trial of max gerd rx 04/29/2015 >>>      Family History  Problem Relation Age of Onset   Breast cancer Sister    Thyroid disease Sister    Hypertension Mother    Colon cancer Brother 78       dx'd about age 56  per pt-    Prostate cancer Brother    Lung cancer Brother    Liver cancer Brother    ALS Brother    Healthy Daughter    Healthy Daughter    Healthy Son    Healthy Son    Healthy Son    Colon polyps Neg Hx    Esophageal cancer Neg Hx    Rectal cancer Neg Hx    Stomach cancer Neg Hx     Social History   Socioeconomic History   Marital status: Married    Spouse name: Not on file   Number of children: 5   Years of education: Not on file   Highest education level: Not on file  Occupational History    Employer: [employer]  Tobacco Use   Smoking status: Never   Smokeless tobacco: Never  Vaping Use   Vaping Use: Never used  Substance and Sexual Activity   Alcohol use: No  Drug use: No   Sexual activity: Not Currently    Birth control/protection: Surgical    Comment: Hyst  Other Topics Concern   Not on file  Social History Narrative   Married, full time Network engineer. 3 pets    [place]   6-8 hours sleep    Right Handed   Lives in a two story home.    Drinks 1 cup of Coffee   Social Determinants of Health   Financial Resource Strain: Low Risk  (08/16/2021)   Overall Financial Resource Strain (CARDIA)    Difficulty of Paying Living Expenses: Not hard at all  Food Insecurity: No Food Insecurity (08/02/2020)   Hunger Vital Sign    Worried About Running Out of Food in the Last Year: Never true    Ran Out of Food in the Last Year: Never true  Transportation Needs: No Transportation Needs (08/16/2021)   PRAPARE - Hydrologist (Medical): No    Lack of Transportation (Non-Medical): No  Physical Activity: Insufficiently Active (08/02/2020)   Exercise Vital Sign    Days of Exercise per Week: 3 days    Minutes of Exercise per Session: 30 min  Stress: Stress Concern Present (08/02/2020)   [place]    Feeling of Stress : To some extent  Social Connections: Moderately  Integrated (08/02/2020)   Social Connection and Isolation Panel [NHANES]    Frequency of Communication with Friends and Family: More than three times a week    Frequency of Social Gatherings with Friends and Family: Three times a week    Attends Religious Services: Never    Active Member of Clubs or Organizations: Yes    Attends Music therapist: More than 4 times per year    Marital Status: Married    Outpatient Medications Prior to Visit  Medication Sig Dispense Refill   albuterol (VENTOLIN HFA) 108 (90 Base) MCG/ACT inhaler Inhale 1-2 puffs into the lungs every 6 (six) hours as needed for wheezing or shortness of breath. 1 each 0   ascorbic acid (VITAMIN C) 500 MG tablet Take 1 tablet by mouth daily.     aspirin 81 MG chewable tablet Chew by mouth daily.     blood glucose meter kit and supplies KIT Dispense based on patient and insurance preference. Use up to four times daily as directed. 1 each 0   Cyanocobalamin (VITAMIN B-12 PO) Take 5,000 mcg by mouth. Liquid     doxycycline (VIBRAMYCIN) 100 MG capsule Take 1 capsule (100 mg total) by mouth 2 (two) times daily. 20 capsule 0   gabapentin (NEURONTIN) 100 MG capsule TAKE 2 CAPSULES BY MOUTH AT BEDTIME. 180 capsule 3   losartan-hydrochlorothiazide (HYZAAR) 100-25 MG tablet TAKE 1 TABLET BY MOUTH EVERY DAY 90 tablet 1   MAGNESIUM PO Take by mouth daily.     MULTIPLE VITAMIN PO Take 1 capsule by mouth daily.     omeprazole (PRILOSEC OTC) 20 MG tablet Take 20 mg by mouth daily.     Vitamin D, Ergocalciferol, (DRISDOL) 1.25 MG (50000 UNIT) CAPS capsule Take 1 capsule (50,000 Units total) by mouth every 7 (seven) days. 12 capsule 0   amLODipine (NORVASC) 10 MG tablet TAKE 1 TABLET BY MOUTH EVERY DAY 90 tablet 1   No facility-administered medications prior to visit.     EXAM:  BP 138/68 (BP Location: Right Arm, Patient Position: Sitting, Cuff Size: Large)   Pulse 80  Temp 98.3 F (36.8 C) (Oral)   Ht 5' 1"$  (1.549 m)    Wt 165 lb (74.8 kg)   SpO2 98%   BMI 31.18 kg/m   Body mass index is 31.18 kg/m.  GENERAL: vitals reviewed and listed above, alert, oriented, appears well hydrated and in no acute distress HEENT: atraumatic, conjunctiva  clear, no obvious abnormalities on inspection of external nose and earmasked  NECK: no obvious masses on inspection palpation  LUNGS: clear to auscultation bilaterally, no wheezes, rales or rhonchi, good air movement CV: HRRR, no clubbing cyanosis or  peripheral edema nl cap refill  MS: moves all extremities without noticeable focal  abnormality basically nl gait PSYCH: pleasant and cooperative, no obvious depression or anxiety Lab Results  Component Value Date   WBC 7.4 02/09/2023   HGB 13.2 02/09/2023   HCT 41.6 02/09/2023   PLT 345 02/09/2023   GLUCOSE 93 02/09/2023   CHOL 227 (H) 05/01/2022   TRIG 90.0 05/01/2022   HDL 59.30 05/01/2022   LDLDIRECT 153.2 06/23/2012   LDLCALC 150 (H) 05/01/2022   ALT 18 12/26/2022   AST 20 12/26/2022   NA 134 (L) 02/09/2023   K 3.0 (L) 02/09/2023   CL 99 02/09/2023   CREATININE 0.89 02/09/2023   BUN 15 02/09/2023   CO2 26 02/09/2023   TSH 0.64 05/01/2022   INR 0.9 11/29/2008   HGBA1C 5.6 05/01/2022   BP Readings from Last 3 Encounters:  02/13/23 138/68  02/10/23 (!) 158/48  12/26/22 132/74   Ed record review  ASSESSMENT AND PLAN:  Discussed the following assessment and plan:  Cough in adult with dyspena - Plan: Ambulatory referral to Allergy  Low blood potassium - Plan: Basic metabolic panel  Essential hypertension - Plan: Basic metabolic panel  Hip pain, left Episodes of dyspnea with cough under certain triggers most recently probably a viral infection but on further questioning reacts to environmental inhalation strong smells perfumes etc.  This current episode states that she was short of breath and felt scared with this and her husband encouraged her to seek care in the ED.  Fortunately no evidence of  alarming diagnosis. She is much better today we discussed possible allergy consult Will do referral to check for possible allergen reaction if symptoms recur try the albuterol inhaler.  In regard to her left hip leg pain follow-up with sports medicine she has an appointment for other problems in March with Dr. Tamala Julian.    Recheck potassium level today she is on losartan HCTZ for hypertension that has been controlled t advised to return or notify health care team  if  new concerns arise.  Patient Instructions  Exam is good.  Will do referral to allergy . To help [place] if you have allergy  aggravating these sx.  ? If getting bronchospasm  with certain irritants infection etc.   Can see ortho or  sports team  if hip problem progresses  could be bursitis again.      Standley Brooking. Rodgerick Gilliand M.D.

## 2023-02-13 NOTE — Patient Instructions (Addendum)
Exam is good.  Will do referral to allergy . To help [place] if you have allergy  aggravating these sx.  ? If getting bronchospasm  with certain irritants infection etc.   Can see ortho or  sports team  if hip problem progresses  could be bursitis again.

## 2023-02-14 LAB — BASIC METABOLIC PANEL
BUN: 15 mg/dL (ref 6–23)
CO2: 22 mEq/L (ref 19–32)
Calcium: 11.1 mg/dL — ABNORMAL HIGH (ref 8.4–10.5)
Chloride: 108 mEq/L (ref 96–112)
Creatinine, Ser: 0.8 mg/dL (ref 0.40–1.20)
GFR: 74.3 mL/min (ref >60.00)
Glucose, Bld: 77 mg/dL (ref 70–99)
Potassium: 3.7 mEq/L (ref 3.5–5.1)
Sodium: 143 mEq/L (ref 135–145)

## 2023-02-18 ENCOUNTER — Telehealth: Payer: Self-pay

## 2023-02-18 NOTE — Telephone Encounter (Signed)
        Patient  visited [facility] on 02/10/2023  for bronchitis.   Telephone encounter attempt :  1st  A HIPAA compliant voice message was left requesting a return call.  Instructed patient to call back at [phone].   [place] Resource Care Guide   ??[email or web address]  ?? [phone]   Website: [email or web address]  [email or web address]

## 2023-02-19 ENCOUNTER — Ambulatory Visit (INDEPENDENT_AMBULATORY_CARE_PROVIDER_SITE_OTHER): Payer: Medicare Other | Admitting: Allergy & Immunology

## 2023-02-19 ENCOUNTER — Telehealth: Payer: Self-pay

## 2023-02-19 ENCOUNTER — Encounter: Payer: Self-pay | Admitting: Allergy & Immunology

## 2023-02-19 ENCOUNTER — Other Ambulatory Visit: Payer: Self-pay

## 2023-02-19 VITALS — BP 130/70 | HR 75 | Temp 98.0°F | Resp 12 | Ht 59.45 in | Wt 161.3 lb

## 2023-02-19 DIAGNOSIS — J310 Chronic rhinitis: Secondary | ICD-10-CM | POA: Diagnosis not present

## 2023-02-19 DIAGNOSIS — S46112D Strain of muscle, fascia and tendon of long head of biceps, left arm, subsequent encounter: Secondary | ICD-10-CM | POA: Diagnosis not present

## 2023-02-19 DIAGNOSIS — R0602 Shortness of breath: Secondary | ICD-10-CM

## 2023-02-19 DIAGNOSIS — M6281 Muscle weakness (generalized): Secondary | ICD-10-CM | POA: Diagnosis not present

## 2023-02-19 DIAGNOSIS — M7542 Impingement syndrome of left shoulder: Secondary | ICD-10-CM | POA: Diagnosis not present

## 2023-02-19 DIAGNOSIS — S46012D Strain of muscle(s) and tendon(s) of the rotator cuff of left shoulder, subsequent encounter: Secondary | ICD-10-CM | POA: Diagnosis not present

## 2023-02-19 DIAGNOSIS — M25612 Stiffness of left shoulder, not elsewhere classified: Secondary | ICD-10-CM | POA: Diagnosis not present

## 2023-02-19 NOTE — Patient Instructions (Addendum)
1. Chronic rhinitis - We are going to get some environmental allergy testing via the blood to see if there is evidence of environmental allergies. - We will call you in 1-2 weeks with the results of the testing.  - We might consider doing skin testing in the future.  - You still have a lot of mucous on the right side, so we are going to start a different antibiotic to see if we can clear this up. - We might have to get a sinus CT if there is no improvement. - Start cefdinir '300mg'$  twice daily for ten days.  2. SOB (shortness of breath) - Lung testing looked great today.  - It did not really improve with the albuterol treatment. - We are going to start a controller (daily) medication to see if this helps with your shortness of breath sensation.  - I am not sure that this is asthma, but time will tell.  - Sample of Breztri provided today. - Spacer sample and demonstration provided. - Daily controller medication(s): Breztri two puffs twice daily with spacer - Prior to physical activity: albuterol 2 puffs 10-15 minutes before physical activity. - Rescue medications: albuterol 4 puffs every 4-6 hours as needed - Asthma control goals:  * Full participation in all desired activities (may need albuterol before activity) * Albuterol use two time or less a week on average (not counting use with activity) * Cough interfering with sleep two time or less a month * Oral steroids no more than once a year * No hospitalizations  3. Return in about 2 weeks (around 03/05/2023) for SKIN TESTING.    Please inform us of any Emergency Department visits, hospitalizations, or changes in symptoms. Call us before going to the ED for breathing or allergy symptoms since we might be able to fit you in for a sick visit. Feel free to contact us anytime with any questions, problems, or concerns.  It was a pleasure to meet you today!  Websites that have reliable patient information: 1. American Academy of Asthma,  Allergy, and Immunology: www.aaaai.org 2. Food Allergy Research and Education (FARE): foodallergy.org 3. Mothers of Asthmatics: http://www.asthmacommunitynetwork.org 4. American College of Allergy, Asthma, and Immunology: www.acaai.org   COVID-19 Vaccine Information can be found at: ShippingScam.co.uk For questions related to vaccine distribution or appointments, please email [email or web address] or call [phone].   We realize that you might be concerned about having an allergic reaction to the COVID19 vaccines. To help with that concern, WE ARE OFFERING THE COVID19 VACCINES IN OUR OFFICE! Ask the front desk for dates!     "Like" [place] on Facebook and Instagram for our latest updates!      A healthy democracy works best when [employer] participate! Make sure you are registered to vote! If you have moved or changed any of your contact information, you will need to get this updated before voting!  In some cases, you MAY be able to register to vote online: [email or web address]

## 2023-02-19 NOTE — Progress Notes (Signed)
NEW PATIENT  Date of Service/Encounter:  02/20/23  Consult requested by: Burnis Medin, MD   Assessment:   SOB (shortness of breath) - treating as asthma today  Chronic rhinitis - getting lab work today  GERD - on pantoprazole  Plan/Recommendations:   1. Chronic rhinitis - We are going to get some environmental allergy testing via the blood to see if there is evidence of environmental allergies. - We will call you in 1-2 weeks with the results of the testing.  - We might consider doing skin testing in the future.  - You still have a lot of mucous on the right side, so we are going to start a different antibiotic to see if we can clear this up. - We might have to get a sinus CT if there is no improvement. - Start cefdinir '300mg'$  twice daily for ten days.  2. SOB (shortness of breath) - Lung testing looked great today.  - It did not really improve with the albuterol treatment. - We are going to start a controller (daily) medication to see if this helps with your shortness of breath sensation.  - I am not sure that this is asthma, but time will tell.  - Sample of Breztri provided today. - Spacer sample and demonstration provided. - Daily controller medication(s): Breztri two puffs twice daily with spacer - Prior to physical activity: albuterol 2 puffs 10-15 minutes before physical activity. - Rescue medications: albuterol 4 puffs every 4-6 hours as needed - Asthma control goals:  * Full participation in all desired activities (may need albuterol before activity) * Albuterol use two time or less a week on average (not counting use with activity) * Cough interfering with sleep two time or less a month * Oral steroids no more than once a year * No hospitalizations  3. Return in about 2 weeks (around 03/05/2023) for SKIN TESTING.    This note in its entirety was forwarded to the Provider who requested this consultation.  Subjective:   Kaylee Adams is a 72 y.o. female  presenting today for evaluation of  Chief Complaint  Patient presents with   Cough   Nasal Congestion   Breathing Problem    Kaylee Adams has a history of the following: Patient Active Problem List   Diagnosis Date Noted   Hypercalcemia 12/26/2022   Degenerative arthritis of left knee 10/02/2022   Acromioclavicular pain 08/08/2022   Partial tear of left rotator cuff 03/15/2022   Pes cavus 01/29/2021   DDD (degenerative disc disease), cervical 01/29/2021   DDD (degenerative disc disease), lumbar 01/29/2021   Vitamin D deficiency 01/29/2021   Atrophy of muscle of right lower leg 07/29/2020   Peroneal cyst, right 06/16/2020   Degenerative joint disease of knee, right 10/13/2019   Right hip pain 10/13/2019   Essential hypertension 04/13/2015   Hoarseness 04/13/2015   Low TSH level 07/30/2014   Neck discomfort 07/30/2014   Goiter ? right  07/30/2014   Trouble swallowing hx of  07/30/2014   Visit for preventive health examination 07/14/2014   GERD (gastroesophageal reflux disease) 01/01/2014   Hypokalemia 12/18/2013   Complete tear of right rotator cuff 11/03/2013   Right shoulder pain 06/30/2013   Greater trochanteric bursitis of left hip 05/08/2012   Skin cyst 07/16/2011   Cyst 07/16/2011   HEPATIC CYST 09/12/2009   BACK PAIN, THORACIC REGION, LEFT 03/28/2009   CAD, NATIVE VESSEL 12/26/2008   CHEST PAIN, ATYPICAL 12/02/2008   INTERNAL HEMORRHOIDS 01/09/2008  EXTERNAL HEMORRHOIDS 01/09/2008   LIPOMA NOS 09/16/2007   Hyperlipemia 09/16/2007    History obtained from: chart review and patient.  [name] was referred by Panosh, Standley Brooking, MD.     Kaylee Adams is a 72 y.o. female presenting for an evaluation of coughing and SOB .   Asthma/Respiratory Symptom History: She has never been diagnosed with any asthma. She was given albuterol in the ED but she has not used it. She has never realized when she was supposed to use it.  She was never been a smoker. 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 She worked for [employer] in [place]. Prior to that, she was working in a Engineer, maintenance (IT). She was around a lot of dust in that environment. She did wear a mask when she sewed the blanket. She never had breathing issues then.   Allergic Rhinitis Symptom History: She reports that she has a lot of sinus issues. It has been going on for two weeks now. She is having a lot of sinus pain and pressure. She did feel that she could not get enough air. She did  to the ED and she was diagnosed with bronchitis. She did get an antibiotic.   Last antihistamine was last night. She has [employer] anyway, so we cannot do testing today anyway.   Infectious history is largely unremarkable. She did have an antibiotic in her tooth a month ago. She completed that. Otherwise, she has not required the use of antibiotics at all for any infections.   She had extensive workup in ED including a chest CT which was negative for a pulmonary embolism. Evidently she was reporting that she was SOB and they wanted to do for an extra precaution. She had a normal EKG and troponin and viral testing.   She continues to have congestion in her chest. She is almost finished with her doxycycline. She was hesitant to take it because she was "scared". She was having trouble sleeping and had to sit up in the bed. She got some OTC allergy pills and nasal sprays; this did help somewhat.   She had GERD, but she has not been using her Protonix consistently. She is open to trying that regularly.   She lives in [place]. She has been living there for 47 years or so.  She did not have any history of asthma, allergic rhinitis, or food allergies. She does report that she has had gluey stringy mucous when stuff comes out of her nose. This is not green, but is not clear.   Otherwise, there is no history of other atopic diseases, including food allergies, drug allergies, stinging insect allergies, or  contact dermatitis. There is no significant infectious history. Vaccinations are up to date.    Past Medical History: Patient Active Problem List   Diagnosis Date Noted   Hypercalcemia 12/26/2022   Degenerative arthritis of left knee 10/02/2022   Acromioclavicular pain 08/08/2022   Partial tear of left rotator cuff 03/15/2022   Pes cavus 01/29/2021   DDD (degenerative disc disease), cervical 01/29/2021   DDD (degenerative disc disease), lumbar 01/29/2021   Vitamin D deficiency 01/29/2021   Atrophy of muscle of right lower leg 07/29/2020   Peroneal cyst, right 06/16/2020   Degenerative joint disease of knee, right 10/13/2019   Right hip pain 10/13/2019   Essential hypertension 04/13/2015   Hoarseness 04/13/2015   Low TSH level 07/30/2014   Neck discomfort 07/30/2014   Goiter ? right  07/30/2014   Trouble swallowing hx of  07/30/2014   Visit for preventive health examination 07/14/2014   GERD (gastroesophageal reflux disease) 01/01/2014   Hypokalemia 12/18/2013   Complete tear of right rotator cuff 11/03/2013   Right shoulder pain 06/30/2013   Greater trochanteric bursitis of left hip 05/08/2012   Skin cyst 07/16/2011   Cyst 07/16/2011   HEPATIC CYST 09/12/2009   BACK PAIN, THORACIC REGION, LEFT 03/28/2009   CAD, NATIVE VESSEL 12/26/2008   CHEST PAIN, ATYPICAL 12/02/2008   INTERNAL HEMORRHOIDS 01/09/2008   EXTERNAL HEMORRHOIDS 01/09/2008   LIPOMA NOS 09/16/2007   Hyperlipemia 09/16/2007    Medication List:  Allergies as of 02/19/2023       Reactions   Lisinopril    REACTION: cough   Penicillins Nausea Only        Medication List        Accurate as of February 19, 2023 11:59 PM. If you have any questions, ask your nurse or doctor.          albuterol 108 (90 Base) MCG/ACT inhaler Commonly known as: VENTOLIN HFA Inhale 1-2 puffs into the lungs every 6 (six) hours as needed for wheezing or shortness of breath.   amLODipine 10 MG tablet Commonly known as:  NORVASC TAKE 1 TABLET BY MOUTH EVERY DAY   ascorbic acid 500 MG tablet Commonly known as: VITAMIN C Take 1 tablet by mouth daily.   aspirin 81 MG chewable tablet Chew by mouth daily.   blood glucose meter kit and supplies Kit Dispense based on patient and insurance preference. Use up to four times daily as directed.   doxycycline 100 MG capsule Commonly known as: VIBRAMYCIN Take 1 capsule (100 mg total) by mouth 2 (two) times daily.   fluticasone 50 MCG/ACT nasal spray Commonly known as: FLONASE   gabapentin 100 MG capsule Commonly known as: NEURONTIN TAKE 2 CAPSULES BY MOUTH AT BEDTIME.   losartan-hydrochlorothiazide 100-25 MG tablet Commonly known as: HYZAAR TAKE 1 TABLET BY MOUTH EVERY DAY   MAGNESIUM PO Take by mouth daily.   MULTIPLE VITAMIN PO Take 1 capsule by mouth daily.   omeprazole 20 MG tablet Commonly known as: PRILOSEC OTC Take 20 mg by mouth daily.   VITAMIN B-12 PO Take 5,000 mcg by mouth. Liquid   Vitamin D (Ergocalciferol) 1.25 MG (50000 UNIT) Caps capsule Commonly known as: DRISDOL Take 1 capsule (50,000 Units total) by mouth every 7 (seven) days.        Birth History: non-contributory  Developmental History: non-contributory  Past Surgical History: Past Surgical History:  Procedure Laterality Date   ABDOMINAL HYSTERECTOMY     fibroid tumors   BREAST BIOPSY Right    BREAST EXCISIONAL BIOPSY Right    child birth x 5     CHOLECYSTECTOMY     COLONOSCOPY  01/09/2008   SHOULDER ARTHROSCOPY WITH DISTAL CLAVICLE RESECTION Left 11/29/2022   Procedure: SHOULDER ARTHROSCOPY WITH DISTAL CLAVICLE EXCISION;  Surgeon: Hiram Gash, MD;  Location: [facility];  Service: Orthopedics;  Laterality: Left;   SHOULDER ARTHROSCOPY WITH ROTATOR CUFF REPAIR AND SUBACROMIAL DECOMPRESSION Right 11/03/2013   Procedure: RIGHT SHOULDER ARTHROSCOPY WITH DEBRIDEMENT, ROTATOR CUFF REPAIR AND SUBACROMIAL DECOMPRESSION, DISTAL CLAVICLE RESECTION;   Surgeon: Mcarthur Rossetti, MD;  Location: [facility];  Service: Orthopedics;  Laterality: Right;   SHOULDER ARTHROSCOPY WITH SUBACROMIAL DECOMPRESSION, ROTATOR CUFF REPAIR AND BICEP TENDON REPAIR Left 11/29/2022   Procedure: SHOULDER ARTHROSCOPY WITH SUBACROMIAL DECOMPRESSION, ROTATOR CUFF REPAIR AND BICEP TENDON REPAIR;  Surgeon: Hiram Gash, MD;  Location: [facility];  Service: Orthopedics;  Laterality: Left;   VESICOVAGINAL FISTULA CLOSURE W/ TAH     for fibroid tumors     Family History: Family History  Problem Relation Age of Onset   Breast cancer Sister    Thyroid disease Sister    Hypertension Mother    Colon cancer Brother 53       dx'd about age 40 per pt-    Prostate cancer Brother    Lung cancer Brother    Liver cancer Brother    ALS Brother    Healthy Daughter    Healthy Daughter    Healthy Son    Healthy Son    Healthy Son    Colon polyps Neg Hx    Esophageal cancer Neg Hx    Rectal cancer Neg Hx    Stomach cancer Neg Hx      Social History: Maverick lives at home with her husband.  She lives in a house with wood throughout the home.  They have electric heating and central cooling.  There are no animals inside or outside of the home.  There are no dust mite covers on the bedding.  There is no tobacco exposure.  She is currently retired.  She has not exposed to fumes, chemicals, or dust.  She does not live Industrial area.   Review of Systems  Constitutional: Negative.  Negative for fever, malaise/fatigue and weight loss.  HENT: Negative.  Negative for congestion, ear discharge, ear pain and sinus pain.   Eyes:  Negative for pain, discharge and redness.  Respiratory:  Positive for cough and shortness of breath. Negative for sputum production and wheezing.   Cardiovascular: Negative.  Negative for chest pain and palpitations.  Gastrointestinal:  Negative for abdominal pain, heartburn, nausea and vomiting.  Skin: Negative.  Negative for itching and  rash.  Neurological:  Negative for dizziness and headaches.  Endo/Heme/Allergies:  Negative for environmental allergies. Does not bruise/bleed easily.       Objective:   Blood pressure 130/70, pulse 75, temperature 98 F (36.7 C), resp. rate 12, height 4' 11.45" (1.51 m), weight 161 lb 4.8 oz (73.2 kg), SpO2 100 %. Body mass index is 32.09 kg/m.     Physical Exam Vitals reviewed.  Constitutional:      Appearance: She is well-developed.  HENT:     Head: Normocephalic and atraumatic.     Right Ear: Tympanic membrane, ear canal and external ear normal. No drainage, swelling or tenderness. Tympanic membrane is not injected, scarred, erythematous, retracted or bulging.     Left Ear: Tympanic membrane, ear canal and external ear normal. No drainage, swelling or tenderness. Tympanic membrane is not injected, scarred, erythematous, retracted or bulging.     Nose: No nasal deformity, septal deviation, mucosal edema or rhinorrhea.     Right Turbinates: Enlarged, swollen and pale.     Left Turbinates: Enlarged, swollen and pale.     Right Sinus: No maxillary sinus tenderness or frontal sinus tenderness.     Left Sinus: No maxillary sinus tenderness or frontal sinus tenderness.     Mouth/Throat:     Mouth: Mucous membranes are not pale and not dry.     Pharynx: Uvula midline.  Eyes:     General: Allergic shiner present.        Right eye: No discharge.        Left eye: No discharge.     Conjunctiva/sclera: Conjunctivae  normal.     Right eye: Right conjunctiva is not injected. No chemosis.    Left eye: Left conjunctiva is not injected. No chemosis.    Pupils: Pupils are equal, round, and reactive to light.  Cardiovascular:     Rate and Rhythm: Normal rate and regular rhythm.     Heart sounds: Normal heart sounds.  Pulmonary:     Effort: Pulmonary effort is normal. No tachypnea, accessory muscle usage or respiratory distress.     Breath sounds: Normal breath sounds. No wheezing,  rhonchi or rales.     Comments: Moving air well in all lung fields. Making full sentences.  Chest:     Chest wall: No tenderness.  Abdominal:     Tenderness: There is no abdominal tenderness. There is no guarding or rebound.  Lymphadenopathy:     Head:     Right side of head: No submandibular, tonsillar or occipital adenopathy.     Left side of head: No submandibular, tonsillar or occipital adenopathy.     Cervical: No cervical adenopathy.  Skin:    Coloration: Skin is not pale.     Findings: No abrasion, erythema, petechiae or rash. Rash is not papular, urticarial or vesicular.  Neurological:     Mental Status: She is alert.  Psychiatric:        Behavior: Behavior is cooperative.      Diagnostic studies:    Spirometry: results normal (FEV1: 1.91/121%, FVC: 2.29/115%, FEV1/FVC: 83%).    Spirometry consistent with normal pattern. Xopenex four puffs via MDI treatment given in clinic with no improvement.  Allergy Studies: labs sent instead          Salvatore Marvel, MD [facility]

## 2023-02-19 NOTE — Telephone Encounter (Signed)
        Patient  visited [facility] on 02/10/2023  for bronchitis.   Telephone encounter attempt :  2nd  A HIPAA compliant voice message was left requesting a return call.  Instructed patient to call back at [phone].   [facility] Resource Care Guide   [email or web address]  ?? [id number]   Website: [email or web address]  [email or web address]

## 2023-02-20 ENCOUNTER — Encounter: Payer: Self-pay | Admitting: Allergy & Immunology

## 2023-02-20 ENCOUNTER — Telehealth: Payer: Self-pay | Admitting: Allergy & Immunology

## 2023-02-20 MED ORDER — CEFDINIR 300 MG PO CAPS: 300.0000 mg | ORAL_CAPSULE | Freq: Two times a day (BID) | ORAL | 0 refills | Status: AC

## 2023-02-20 NOTE — Progress Notes (Deleted)
[place] Phone: [phone] Subjective:    I'm seeing this patient by the request  of:  Panosh, Standley Brooking, MD  CC:   [id number]  12/26/2022 Patient continues to have hypercalcemia and does have a chronic discomfort and pain.  Will check to make sure that this is not progressing up and get a PTH as well     Postsurgical and is being followed by orthopedics. Discussed with patient again at great length about this and her other problems. Patient has had hypercalcemia. Will recheck labs. Total time with patient 31 minutes   Significant improvement after the injection. We will continue to monitor. Discussed the hip abductor strengthening but patient has not been able to do it as much secondary to recent surgical intervention. Follow-up with me again in 6 to 8 weeks just to check in.   Patient is doing relatively well but does have more discomfort noted on the left leg secondary to multiple other conditions.  We discussed with patient to start increasing activity, patient has difficulty with weight loss we can refer otherwise.  Will consider metabolic medicine.  Discussed with patient about the degenerative arthritis of the knee as well as the greater trochanteric and right hip.  We discussed which activities to do and which ones to avoid.  Follow-up with me again 6 to 8 weeks.      Update 3/52024 Kaylee Adams is a 72 y.o. female coming in with complaint of L shoulder and L hip pain. Patient states       Past Medical History:  Diagnosis Date   Allergy    occ takes otc allergy pill   Arthritis    CAD (coronary artery disease)    a. 11/2005 Cath: nonobs dzs, 30% LAD lesion on cath ;  b. 11/2005 Echo: NL EF;  c. 04/2012 Echo: EF 55-60%, Gr 2 DD;  d. 04/2012 Ex MV: EF 72%, ST depression in recovery with NL perfusion imaging - HTN response to exercise.   Chest pressure "fatigue" 05/08/2012   GERD (gastroesophageal reflux  disease)    occ prilosec OTC   Hepatic cyst    on ct [place]   HLD (hyperlipidemia)    HTN (hypertension)    Midsternal chest pain    cardiology eval with non-ischemic stress test 04/2012   Upper airway cough syndrome 04/29/2015   Trial of max gerd rx 04/29/2015 >>>     Urticaria    Past Surgical History:  Procedure Laterality Date   ABDOMINAL HYSTERECTOMY     fibroid tumors   BREAST BIOPSY Right    BREAST EXCISIONAL BIOPSY Right    child birth x 5     CHOLECYSTECTOMY     COLONOSCOPY  01/09/2008   SHOULDER ARTHROSCOPY WITH DISTAL CLAVICLE RESECTION Left 11/29/2022   Procedure: SHOULDER ARTHROSCOPY WITH DISTAL CLAVICLE EXCISION;  Surgeon: Hiram Gash, MD;  Location: [facility];  Service: Orthopedics;  Laterality: Left;   SHOULDER ARTHROSCOPY WITH ROTATOR CUFF REPAIR AND SUBACROMIAL DECOMPRESSION Right 11/03/2013   Procedure: RIGHT SHOULDER ARTHROSCOPY WITH DEBRIDEMENT, ROTATOR CUFF REPAIR AND SUBACROMIAL DECOMPRESSION, DISTAL CLAVICLE RESECTION;  Surgeon: Mcarthur Rossetti, MD;  Location: [facility];  Service: Orthopedics;  Laterality: Right;   SHOULDER ARTHROSCOPY WITH SUBACROMIAL DECOMPRESSION, ROTATOR CUFF REPAIR AND BICEP TENDON REPAIR Left 11/29/2022   Procedure: SHOULDER ARTHROSCOPY WITH SUBACROMIAL DECOMPRESSION, ROTATOR CUFF REPAIR AND BICEP TENDON REPAIR;  Surgeon: Hiram Gash, MD;  Location: [facility]  [place] [facility];  Service: Orthopedics;  Laterality: Left;   VESICOVAGINAL FISTULA CLOSURE W/ TAH     for fibroid tumors   Social History   Socioeconomic History   Marital status: Married    Spouse name: Not on file   Number of children: 5   Years of education: Not on file   Highest education level: Not on file  Occupational History    Employer: [employer]  Tobacco Use   Smoking status: Never    Passive exposure: Never   Smokeless tobacco: Never  Vaping Use   Vaping Use: Never used  Substance and Sexual Activity   Alcohol use: No   Drug use: No    Sexual activity: Not Currently    Birth control/protection: Surgical    Comment: Hyst  Other Topics Concern   Not on file  Social History Narrative   Married, full time Network engineer. 3 pets    [place]   6-8 hours sleep    Right Handed   Lives in a two story home.    Drinks 1 cup of Coffee   Social Determinants of Health   Financial Resource Strain: Low Risk  (08/16/2021)   Overall Financial Resource Strain (CARDIA)    Difficulty of Paying Living Expenses: Not hard at all  Food Insecurity: No Food Insecurity (08/02/2020)   Hunger Vital Sign    Worried About Running Out of Food in the Last Year: Never true    Ran Out of Food in the Last Year: Never true  Transportation Needs: No Transportation Needs (08/16/2021)   PRAPARE - Hydrologist (Medical): No    Lack of Transportation (Non-Medical): No  Physical Activity: Insufficiently Active (08/02/2020)   Exercise Vital Sign    Days of Exercise per Week: 3 days    Minutes of Exercise per Session: 30 min  Stress: Stress Concern Present (08/02/2020)   [place]    Feeling of Stress : To some extent  Social Connections: Moderately Integrated (08/02/2020)   Social Connection and Isolation Panel [NHANES]    Frequency of Communication with Friends and Family: More than three times a week    Frequency of Social Gatherings with Friends and Family: Three times a week    Attends Religious Services: Never    Active Member of Clubs or Organizations: Yes    Attends Music therapist: More than 4 times per year    Marital Status: Married   Allergies  Allergen Reactions   Lisinopril     REACTION: cough   Penicillins Nausea Only   Family History  Problem Relation Age of Onset   Breast cancer Sister    Thyroid disease Sister    Hypertension Mother    Colon cancer Brother 89       dx'd about age 67 per pt-    Prostate cancer Brother     Lung cancer Brother    Liver cancer Brother    ALS Brother    Healthy Daughter    Healthy Daughter    Healthy Son    Healthy Son    Healthy Son    Colon polyps Neg Hx    Esophageal cancer Neg Hx    Rectal cancer Neg Hx    Stomach cancer Neg Hx      Current Outpatient Medications (Cardiovascular):    amLODipine (NORVASC) 10 MG tablet, TAKE 1 TABLET BY MOUTH EVERY DAY   losartan-hydrochlorothiazide (HYZAAR)  100-25 MG tablet, TAKE 1 TABLET BY MOUTH EVERY DAY  Current Outpatient Medications (Respiratory):    albuterol (VENTOLIN HFA) 108 (90 Base) MCG/ACT inhaler, Inhale 1-2 puffs into the lungs every 6 (six) hours as needed for wheezing or shortness of breath.   fluticasone (FLONASE) 50 MCG/ACT nasal spray,   Current Outpatient Medications (Analgesics):    aspirin 81 MG chewable tablet, Chew by mouth daily.  Current Outpatient Medications (Hematological):    Cyanocobalamin (VITAMIN B-12 PO), Take 5,000 mcg by mouth. Liquid  Current Outpatient Medications (Other):    ascorbic acid (VITAMIN C) 500 MG tablet, Take 1 tablet by mouth daily.   blood glucose meter kit and supplies KIT, Dispense based on patient and insurance preference. Use up to four times daily as directed. (Patient not taking: Reported on 02/19/2023)   doxycycline (VIBRAMYCIN) 100 MG capsule, Take 1 capsule (100 mg total) by mouth 2 (two) times daily.   gabapentin (NEURONTIN) 100 MG capsule, TAKE 2 CAPSULES BY MOUTH AT BEDTIME.   MAGNESIUM PO, Take by mouth daily.   MULTIPLE VITAMIN PO, Take 1 capsule by mouth daily.   omeprazole (PRILOSEC OTC) 20 MG tablet, Take 20 mg by mouth daily.   Vitamin D, Ergocalciferol, (DRISDOL) 1.25 MG (50000 UNIT) CAPS capsule, Take 1 capsule (50,000 Units total) by mouth every 7 (seven) days.   Reviewed prior external information including notes and imaging from  primary care provider As well as notes that were available from care everywhere and other healthcare systems.  Past medical  history, social, surgical and family history all reviewed in electronic medical record.  No pertanent information unless stated regarding to the chief complaint.   Review of Systems:  No headache, visual changes, nausea, vomiting, diarrhea, constipation, dizziness, abdominal pain, skin rash, fevers, chills, night sweats, weight loss, swollen lymph nodes, body aches, joint swelling, chest pain, shortness of breath, mood changes. POSITIVE muscle aches  Objective  There were no vitals taken for this visit.   General: No apparent distress alert and oriented x3 mood and affect normal, dressed appropriately.  HEENT: Pupils equal, extraocular movements intact  Respiratory: Patient's speak in full sentences and does not appear short of breath  Cardiovascular: No lower extremity edema, non tender, no erythema      Impression and Recommendations:

## 2023-02-20 NOTE — Telephone Encounter (Signed)
Patient came in yesterday and was waiting on the Cefdinir to be called in and the [facility] [place] has not received it yet.  [phone]

## 2023-02-20 NOTE — Telephone Encounter (Signed)
Cefdinir 300 has been sent in for 10 days. Patient has been notified and plans to pick it up 02/21/23.

## 2023-02-20 NOTE — Progress Notes (Unsigned)
[place] Phone: [phone] Subjective:   Kaylee Adams, am serving as a scribe for Dr. Hulan Saas.  I'm seeing this patient by the request  of:  Panosh, Standley Brooking, MD  CC: Left shoulder and left hip pain  [id number]  12/26/2022 Patient continues to have hypercalcemia and does have a chronic discomfort and pain. Will check to make sure that this is not progressing up and get a PTH as wel   Postsurgical and is being followed by orthopedics. Discussed with patient again at great length about this and her other problems. Patient has had hypercalcemia. Will recheck labs. Total time with patient 31 minutes   Significant improvement after the injection.  We will continue to monitor.  Discussed the hip abductor strengthening but patient has not been able to do it as much secondary to recent surgical intervention.  Follow-up with me again in 6 to 8 weeks just to check in     Patient is doing relatively well but does have more discomfort noted on the left leg secondary to multiple other conditions. We discussed with patient to start increasing activity, patient has difficulty with weight loss we can refer otherwise. Will consider metabolic medicine. Discussed with patient about the degenerative arthritis of the knee as well as the greater trochanteric and right hip. We discussed which activities to do and which ones to avoid. Follow-up with me again 6 to 8 week   Updated 02/21/2023 Kaylee Adams is a 72 y.o. female coming in with complaint of L shoulder and L hip pain. Patient states that she continues to have pain in lateral aspect of L leg from quad to the calf. Pain worse at night.   L shoulder is doing well. Describes a lump over anterior aspect of R shoulder. Painful to lie on that side. Little pain during the day.    Since we have seen patient patient was in the emergency room hiatus 2 weeks ago.  Patient 8 days ago  did have labs again showing the patient did have elevation of the calcium again at 11.1 last time we did check PTH was 45.    Past Medical History:  Diagnosis Date   Allergy    occ takes otc allergy pill   Arthritis    CAD (coronary artery disease)    a. 11/2005 Cath: nonobs dzs, 30% LAD lesion on cath ;  b. 11/2005 Echo: NL EF;  c. 04/2012 Echo: EF 55-60%, Gr 2 DD;  d. 04/2012 Ex MV: EF 72%, ST depression in recovery with NL perfusion imaging - HTN response to exercise.   Chest pressure "fatigue" 05/08/2012   GERD (gastroesophageal reflux disease)    occ prilosec OTC   Hepatic cyst    on ct [place]   HLD (hyperlipidemia)    HTN (hypertension)    Midsternal chest pain    cardiology eval with non-ischemic stress test 04/2012   Upper airway cough syndrome 04/29/2015   Trial of max gerd rx 04/29/2015 >>>     Urticaria    Past Surgical History:  Procedure Laterality Date   ABDOMINAL HYSTERECTOMY     fibroid tumors   BREAST BIOPSY Right    BREAST EXCISIONAL BIOPSY Right    child birth x 5     CHOLECYSTECTOMY     COLONOSCOPY  01/09/2008   SHOULDER ARTHROSCOPY WITH DISTAL CLAVICLE RESECTION Left 11/29/2022   Procedure: SHOULDER ARTHROSCOPY WITH DISTAL CLAVICLE EXCISION;  Surgeon: Hiram Gash, MD;  Location: [facility];  Service: Orthopedics;  Laterality: Left;   SHOULDER ARTHROSCOPY WITH ROTATOR CUFF REPAIR AND SUBACROMIAL DECOMPRESSION Right 11/03/2013   Procedure: RIGHT SHOULDER ARTHROSCOPY WITH DEBRIDEMENT, ROTATOR CUFF REPAIR AND SUBACROMIAL DECOMPRESSION, DISTAL CLAVICLE RESECTION;  Surgeon: Mcarthur Rossetti, MD;  Location: [facility];  Service: Orthopedics;  Laterality: Right;   SHOULDER ARTHROSCOPY WITH SUBACROMIAL DECOMPRESSION, ROTATOR CUFF REPAIR AND BICEP TENDON REPAIR Left 11/29/2022   Procedure: SHOULDER ARTHROSCOPY WITH SUBACROMIAL DECOMPRESSION, ROTATOR CUFF REPAIR AND BICEP TENDON REPAIR;  Surgeon: Hiram Gash, MD;  Location: [facility];  Service:  Orthopedics;  Laterality: Left;   VESICOVAGINAL FISTULA CLOSURE W/ TAH     for fibroid tumors   Social History   Socioeconomic History   Marital status: Married    Spouse name: Not on file   Number of children: 5   Years of education: Not on file   Highest education level: Not on file  Occupational History    Employer: [employer]  Tobacco Use   Smoking status: Never    Passive exposure: Never   Smokeless tobacco: Never  Vaping Use   Vaping Use: Never used  Substance and Sexual Activity   Alcohol use: Adams   Drug use: Adams   Sexual activity: Not Currently    Birth control/protection: Surgical    Comment: Hyst  Other Topics Concern   Not on file  Social History Narrative   Married, full time Network engineer. 3 pets    [place]   6-8 hours sleep    Right Handed   Lives in a two story home.    Drinks 1 cup of Coffee   Social Determinants of Health   Financial Resource Strain: Low Risk  (08/16/2021)   Overall Financial Resource Strain (CARDIA)    Difficulty of Paying Living Expenses: Not hard at all  Food Insecurity: Adams Food Insecurity (08/02/2020)   Hunger Vital Sign    Worried About Running Out of Food in the Last Year: Never true    Ran Out of Food in the Last Year: Never true  Transportation Needs: Adams Transportation Needs (08/16/2021)   PRAPARE - Hydrologist (Medical): Adams    Lack of Transportation (Non-Medical): Adams  Physical Activity: Insufficiently Active (08/02/2020)   Exercise Vital Sign    Days of Exercise per Week: 3 days    Minutes of Exercise per Session: 30 min  Stress: Stress Concern Present (08/02/2020)   [place]    Feeling of Stress : To some extent  Social Connections: Moderately Integrated (08/02/2020)   Social Connection and Isolation Panel [NHANES]    Frequency of Communication with Friends and Family: More than three times a week    Frequency of Social  Gatherings with Friends and Family: Three times a week    Attends Religious Services: Never    Active Member of Clubs or Organizations: Yes    Attends Music therapist: More than 4 times per year    Marital Status: Married   Allergies  Allergen Reactions   Lisinopril     REACTION: cough   Penicillins Nausea Only   Family History  Problem Relation Age of Onset   Breast cancer Sister    Thyroid disease Sister    Hypertension Mother    Colon cancer Brother 44       dx'd about age 26 per pt-  Prostate cancer Brother    Lung cancer Brother    Liver cancer Brother    ALS Brother    Healthy Daughter    Healthy Daughter    Healthy Son    Healthy Son    Healthy Son    Colon polyps Neg Hx    Esophageal cancer Neg Hx    Rectal cancer Neg Hx    Stomach cancer Neg Hx     Current Outpatient Medications (Endocrine & Metabolic):    predniSONE (DELTASONE) 20 MG tablet, Take 1 tablet (20 mg total) by mouth daily with breakfast.  Current Outpatient Medications (Cardiovascular):    amLODipine (NORVASC) 10 MG tablet, TAKE 1 TABLET BY MOUTH EVERY DAY   losartan-hydrochlorothiazide (HYZAAR) 100-25 MG tablet, TAKE 1 TABLET BY MOUTH EVERY DAY  Current Outpatient Medications (Respiratory):    albuterol (VENTOLIN HFA) 108 (90 Base) MCG/ACT inhaler, Inhale 1-2 puffs into the lungs every 6 (six) hours as needed for wheezing or shortness of breath.   fluticasone (FLONASE) 50 MCG/ACT nasal spray,   Current Outpatient Medications (Analgesics):    aspirin 81 MG chewable tablet, Chew by mouth daily.  Current Outpatient Medications (Hematological):    Cyanocobalamin (VITAMIN B-12 PO), Take 5,000 mcg by mouth. Liquid  Current Outpatient Medications (Other):    ascorbic acid (VITAMIN C) 500 MG tablet, Take 1 tablet by mouth daily.   blood glucose meter kit and supplies KIT, Dispense based on patient and insurance preference. Use up to four times daily as directed.   cefdinir (OMNICEF)  300 MG capsule, Take 1 capsule (300 mg total) by mouth 2 (two) times daily for 10 days.   doxycycline (VIBRAMYCIN) 100 MG capsule, Take 1 capsule (100 mg total) by mouth 2 (two) times daily.   gabapentin (NEURONTIN) 100 MG capsule, TAKE 2 CAPSULES BY MOUTH AT BEDTIME.   MAGNESIUM PO, Take by mouth daily.   MULTIPLE VITAMIN PO, Take 1 capsule by mouth daily.   omeprazole (PRILOSEC OTC) 20 MG tablet, Take 20 mg by mouth daily.   Vitamin D, Ergocalciferol, (DRISDOL) 1.25 MG (50000 UNIT) CAPS capsule, Take 1 capsule (50,000 Units total) by mouth every 7 (seven) days.   Reviewed prior external information including notes and imaging from  primary care provider As well as notes that were available from care everywhere and other healthcare systems.  Past medical history, social, surgical and family history all reviewed in electronic medical record.  Adams pertanent information unless stated regarding to the chief complaint.   Review of Systems:  Adams headache, visual changes, nausea, vomiting, diarrhea, constipation, dizziness, abdominal pain, skin rash, fevers, chills, night sweats, weight loss, swollen lymph nodes,  joint swelling, chest pain, shortness of breath, mood changes. POSITIVE muscle aches, body aches  Objective  Blood pressure 128/70, pulse 78, height '4\' 11"'$  (1.499 m), weight 163 lb (73.9 kg), SpO2 98 %.   General: Adams apparent distress alert and oriented x3 mood and affect normal, dressed appropriately.  HEENT: Pupils equal, extraocular movements intact  Respiratory: Patient's speak in full sentences and does not appear short of breath  Cardiovascular: Adams lower extremity edema, non tender, Adams erythema  Left leg does not have any significant swelling.  Does have some tenderness noted.  Tightness in the calf  Right shoulder does have positive impingement noted  Limited muscular skeletal ultrasound was performed and interpreted by Hulan Saas, M   Limited ultrasound does show  hypoechoic changes within the subacromial area. Impression: Subacromial bursitis    Impression and  Recommendations:     The above documentation has been reviewed and is accurate and complete Lyndal Pulley, DO

## 2023-02-20 NOTE — Addendum Note (Signed)
Addended by: Larence Penning on: 02/20/2023 05:36 PM   Modules accepted: Orders

## 2023-02-21 ENCOUNTER — Ambulatory Visit: Payer: Self-pay

## 2023-02-21 ENCOUNTER — Ambulatory Visit (INDEPENDENT_AMBULATORY_CARE_PROVIDER_SITE_OTHER): Payer: Medicare Other | Admitting: Family Medicine

## 2023-02-21 VITALS — BP 128/70 | HR 78 | Ht 59.0 in | Wt 163.0 lb

## 2023-02-21 DIAGNOSIS — M5136 Other intervertebral disc degeneration, lumbar region: Secondary | ICD-10-CM

## 2023-02-21 DIAGNOSIS — M25512 Pain in left shoulder: Secondary | ICD-10-CM

## 2023-02-21 DIAGNOSIS — M25562 Pain in left knee: Secondary | ICD-10-CM | POA: Diagnosis not present

## 2023-02-21 DIAGNOSIS — M7551 Bursitis of right shoulder: Secondary | ICD-10-CM

## 2023-02-21 MED ORDER — PREDNISONE 20 MG PO TABS: 20.0000 mg | ORAL_TABLET | Freq: Every day | ORAL | 0 refills | Status: DC

## 2023-02-21 NOTE — Patient Instructions (Addendum)
[facility]  ([facility] in [facility]) [address] [place], [place] [address] [phone] 12:30p 02/22/2023 Arrive 15 minutes early  Prednisone '20mg'$  daily for 5 days Take antibiotic Exercises for her shoulder If doing well see me again in 7-8 weeks If worsening pain seek medical attention

## 2023-02-21 NOTE — Telephone Encounter (Signed)
Thank you!   Salvatore Marvel, MD [facility] [place]

## 2023-02-21 NOTE — Assessment & Plan Note (Signed)
Patient does have some loss of lordosis noted.  Patient could have some more of a lumbar radiculopathy.  Patient will increase activity slowly.  Patient is worried about the leg pain we will get a Doppler to further evaluate but patient did have an elevated D-dimer from the emergency room patient CT low-dose scan of the chest was unremarkable.  Patient is still concerned that she may have a DVT and will get the Doppler.  I am concerned more from the back and we can consider that.  Patient declined any type of medication at the moment.

## 2023-02-21 NOTE — Assessment & Plan Note (Signed)
Subacromial bursitis.  Discussed home exercises and icing regimen.  Hold on any injection today.  Given home exercises.  Follow-up again in 6 to 8 weeks

## 2023-02-22 ENCOUNTER — Encounter (HOSPITAL_COMMUNITY): Payer: Medicare Other

## 2023-02-22 LAB — ALLERGENS W/COMP RFLX AREA 2
Alternaria Alternata IgE: <0.1 kU/L
Aspergillus Fumigatus IgE: <0.1 kU/L
Bermuda Grass IgE: <0.1 kU/L
Cedar, Mountain IgE: <0.1 kU/L
Cladosporium Herbarum IgE: <0.1 kU/L
Cockroach, German IgE: <0.1 kU/L
Common Silver Birch IgE: <0.1 kU/L
Cottonwood IgE: <0.1 kU/L
D Farinae IgE: <0.1 kU/L
D Pteronyssinus IgE: <0.1 kU/L
E001-IgE Cat Dander: <0.1 kU/L
E005-IgE Dog Dander: <0.1 kU/L
Elm, American IgE: <0.1 kU/L
IgE (Immunoglobulin E), Serum: 4 IU/mL — ABNORMAL LOW (ref 6–495)
Johnson Grass IgE: <0.1 kU/L
Maple/Box Elder IgE: <0.1 kU/L
Mouse Urine IgE: <0.1 kU/L
Oak, White IgE: <0.1 kU/L
Pecan, Hickory IgE: <0.1 kU/L
Penicillium Chrysogen IgE: <0.1 kU/L
Pigweed, Rough IgE: <0.1 kU/L
Ragweed, Short IgE: <0.1 kU/L
Sheep Sorrel IgE Qn: <0.1 kU/L
Timothy Grass IgE: <0.1 kU/L
White Mulberry IgE: <0.1 kU/L

## 2023-02-22 LAB — ALLERGEN PROFILE, MOLD
Aureobasidi Pullulans IgE: <0.1 kU/L
Candida Albicans IgE: <0.1 kU/L
M009-IgE Fusarium proliferatum: <0.1 kU/L
M014-IgE Epicoccum purpur: <0.1 kU/L
Mucor Racemosus IgE: <0.1 kU/L
Phoma Betae IgE: <0.1 kU/L
Setomelanomma Rostrat: <0.1 kU/L
Stemphylium Herbarum IgE: <0.1 kU/L

## 2023-02-26 ENCOUNTER — Ambulatory Visit: Payer: Medicare Other | Admitting: Family Medicine

## 2023-02-26 DIAGNOSIS — M25612 Stiffness of left shoulder, not elsewhere classified: Secondary | ICD-10-CM | POA: Diagnosis not present

## 2023-02-26 DIAGNOSIS — S46112D Strain of muscle, fascia and tendon of long head of biceps, left arm, subsequent encounter: Secondary | ICD-10-CM | POA: Diagnosis not present

## 2023-02-26 DIAGNOSIS — M7542 Impingement syndrome of left shoulder: Secondary | ICD-10-CM | POA: Diagnosis not present

## 2023-02-26 DIAGNOSIS — M6281 Muscle weakness (generalized): Secondary | ICD-10-CM | POA: Diagnosis not present

## 2023-02-26 DIAGNOSIS — S46012D Strain of muscle(s) and tendon(s) of the rotator cuff of left shoulder, subsequent encounter: Secondary | ICD-10-CM | POA: Diagnosis not present

## 2023-02-27 ENCOUNTER — Ambulatory Visit (HOSPITAL_COMMUNITY)
Admission: RE | Admit: 2023-02-27 | Discharge: 2023-02-27 | Disposition: A | Discharge status: Home / Self Care | Payer: Medicare Other | Source: Ambulatory Visit | Attending: Internal Medicine | Admitting: Internal Medicine

## 2023-02-27 DIAGNOSIS — M25562 Pain in left knee: Secondary | ICD-10-CM | POA: Insufficient documentation

## 2023-02-28 DIAGNOSIS — M7542 Impingement syndrome of left shoulder: Secondary | ICD-10-CM | POA: Diagnosis not present

## 2023-03-04 ENCOUNTER — Encounter: Payer: Self-pay | Admitting: Family Medicine

## 2023-03-04 ENCOUNTER — Ambulatory Visit (INDEPENDENT_AMBULATORY_CARE_PROVIDER_SITE_OTHER): Payer: Medicare Other | Admitting: Family Medicine

## 2023-03-04 ENCOUNTER — Other Ambulatory Visit: Payer: Self-pay

## 2023-03-04 VITALS — BP 120/62 | HR 68 | Temp 97.9°F | Resp 12 | Ht 59.0 in | Wt 162.4 lb

## 2023-03-04 DIAGNOSIS — J3089 Other allergic rhinitis: Secondary | ICD-10-CM

## 2023-03-04 DIAGNOSIS — R0602 Shortness of breath: Secondary | ICD-10-CM

## 2023-03-04 DIAGNOSIS — K219 Gastro-esophageal reflux disease without esophagitis: Secondary | ICD-10-CM

## 2023-03-04 DIAGNOSIS — J310 Chronic rhinitis: Secondary | ICD-10-CM | POA: Insufficient documentation

## 2023-03-04 NOTE — Addendum Note (Signed)
Addended by: Dara Hoyer on: 03/04/2023 04:54 PM   Modules accepted: Orders

## 2023-03-04 NOTE — Addendum Note (Signed)
Addended by: Augustina Mood on: 03/04/2023 01:50 PM   Modules accepted: Orders

## 2023-03-04 NOTE — Patient Instructions (Addendum)
Shortness of breath/Asthma Continue Breztri 2 puffs twice a day with a spacer to prevent cough or wheeze Continue albuterol 2 puffs once every 4 hours as needed for cough or wheeze You may use albuterol 2 puffs 5 to 15 minutes before activity to decrease cough or wheeze  Allergic rhinitis Your skin testing at today's visit was positive to dog, cockroach, and dust mites. Avoidance measures are listed below Continue an antihistamine once a day as needed for runny nose or itch. Remember to rotate to a different antihistamine about every 3 months. Some examples of over the counter antihistamines include Zyrtec (cetirizine), Xyzal (levocetirizine), Allegra (fexofenadine), and Claritin (loratidine).  Continue Flonase 2 sprays in each nostril once a day as needed for a stuffy nose.  In the right nostril, point the applicator out toward the right ear. In the left nostril, point the applicator out toward the left ear Consider saline nasal rinses as needed for nasal symptoms. Use this before any medicated nasal sprays for best result Consider allergen immunotherapy if the treatment plan is not working well for you. Call the clinic to set up your first appointment  Reflux Continue dietary and lifestyle modifications as listed below Continue pantoprazole once a day as previously prescribed.  Take this medication at least 30 minutes before your first meal for best results  Call the clinic if this treatment plan is not working well for you.  Follow up in 2 months or sooner if needed.  Control of Dog or Cat Allergen Avoidance is the best way to manage a dog or cat allergy. If you have a dog or cat and are allergic to dog or cats, consider removing the dog or cat from the home. If you have a dog or cat but don't want to find it a new home, or if your family wants a pet even though someone in the household is allergic, here are some strategies that may help keep symptoms at bay:  Keep the pet out of your  bedroom and restrict it to only a few rooms. Be advised that keeping the dog or cat in only one room will not limit the allergens to that room. Don't pet, hug or kiss the dog or cat; if you do, wash your hands with soap and water. High-efficiency particulate air (HEPA) cleaners run continuously in a bedroom or living room can reduce allergen levels over time. Regular use of a high-efficiency vacuum cleaner or a central vacuum can reduce allergen levels. Giving your dog or cat a bath at least once a week can reduce airborne allergen.  Control of Cockroach Allergen Cockroach allergen has been identified as an important cause of acute attacks of asthma, especially in urban settings.  There are fifty-five species of cockroach that exist in the [place], however only three, the [place], [place] species produce allergen that can affect patients with Asthma.  Allergens can be obtained from fecal particles, egg casings and secretions from cockroaches.    Remove food sources. Reduce access to water. Seal access and entry points. Spray runways with 0.5-1% Diazinon or Chlorpyrifos Blow boric acid power under stoves and refrigerator. Place bait stations (hydramethylnon) at feeding sites.    Control of Dust Mite Allergen Dust mites play a major role in allergic asthma and rhinitis. They occur in environments with high humidity wherever human skin is found. Dust mites absorb humidity from the atmosphere (ie, they do not drink) and feed on organic matter (including shed human and animal skin).  Dust mites are a microscopic type of insect that you cannot see with the naked eye. High levels of dust mites have been detected from mattresses, pillows, carpets, upholstered furniture, bed covers, clothes, soft toys and any woven material. The principal allergen of the dust mite is found in its feces. A gram of dust may contain 1,000 mites and 250,000 fecal particles. Mite antigen is easily measured in  the air during house cleaning activities. Dust mites do not bite and do not cause harm to humans, other than by triggering allergies/asthma.  Ways to decrease your exposure to dust mites in your home:  1. Encase mattresses, box springs and pillows with a mite-impermeable barrier or cover  2. Wash sheets, blankets and drapes weekly in hot water (130 F) with detergent and dry them in a dryer on the hot setting.  3. Have the room cleaned frequently with a vacuum cleaner and a damp dust-mop. For carpeting or rugs, vacuuming with a vacuum cleaner equipped with a high-efficiency particulate air (HEPA) filter. The dust mite allergic individual should not be in a room which is being cleaned and should wait 1 hour after cleaning before going into the room.  4. Do not sleep on upholstered furniture (eg, couches).  5. If possible removing carpeting, upholstered furniture and drapery from the home is ideal. Horizontal blinds should be eliminated in the rooms where the person spends the most time (bedroom, study, television room). Washable vinyl, roller-type shades are optimal.  6. Remove all non-washable stuffed toys from the bedroom. Wash stuffed toys weekly like sheets and blankets above.  7. Reduce indoor humidity to less than 50%. Inexpensive humidity monitors can be purchased at most hardware stores. Do not use a humidifier as can make the problem worse and are not recommended.

## 2023-03-04 NOTE — Progress Notes (Signed)
[place] [address] Dept: [phone]  FOLLOW UP NOTE  Patient ID: Kaylee Adams, female    DOB: 24-Jun-1951  Age: 72 y.o. MRN: [id number] Date of Office Visit: 03/04/2023  Assessment  Chief Complaint: Allergy Testing  HPI Kaylee Adams is a 72 year old female who presents to the clinic for follow-up visit.  She was last seen in this clinic on 01/31/2023 by Dr. Ernst Bowler as a new patient for evaluation of shortness of breath, chronic rhinitis, reflux, and acute bacterial sinusitis requiring cefdinir for resolution of symptoms. Unfortunately, the patient's insurance does not allow testing on the initial visit.   At today's visit, she reports that her symptoms of acute sinusitis have resolved with the exception of a slight cough producing clear mucus. Otherwise, she denies other cardiopulmonary, integumentary or gastrointestinal symptoms. She has not taken any antihistamines over the last 3 days. Her last environmental allergy testing via lab was on 02/19/2023 and was negative to the panel.  Her current medications are listed in the chart.  Drug Allergies:  Allergies  Allergen Reactions   Lisinopril     REACTION: cough   Penicillins Nausea Only    Physical Exam: BP 120/62   Pulse 68   Temp 97.9 F (36.6 C)   Resp 12   Ht '4\' 11"'$  (1.499 m)   Wt 162 lb 6.4 oz (73.7 kg)   SpO2 100%   BMI 32.80 kg/m    Physical Exam Vitals reviewed.  Constitutional:      Appearance: Normal appearance.  HENT:     Head: Normocephalic and atraumatic.     Right Ear: Tympanic membrane normal.     Left Ear: Tympanic membrane normal.     Nose:     Comments: Bilateral nares edematous and pale with left more than right. Pharynx normal. Ears normal. Eyes normal.    Mouth/Throat:     Pharynx: Oropharynx is clear.  Eyes:     Conjunctiva/sclera: Conjunctivae normal.  Cardiovascular:     Rate and Rhythm: Normal rate and regular rhythm.     Heart sounds: Normal heart sounds. No murmur  heard. Pulmonary:     Effort: Pulmonary effort is normal.     Breath sounds: Normal breath sounds.     Comments: Lungs clear to auscultation Musculoskeletal:        General: Normal range of motion.     Cervical back: Normal range of motion and neck supple.  Skin:    General: Skin is warm and dry.  Neurological:     Mental Status: She is alert and oriented to person, place, and time.  Psychiatric:        Mood and Affect: Mood normal.        Behavior: Behavior normal.        Thought Content: Thought content normal.        Judgment: Judgment normal.     Diagnostics: Percutaneous environmental allergy testing was negative with adequate controls.  Intradermal environmental allergy testing was positive to dog, dust mites, and cockroach with an adequate control.  Assessment and Plan: 1. Perennial allergic rhinitis   2. SOB (shortness of breath)   3. Gastroesophageal reflux disease, unspecified whether esophagitis present     Patient Instructions  Shortness of breath/Asthma Continue Breztri 2 puffs twice a day with a spacer to prevent cough or wheeze Continue albuterol 2 puffs once every 4 hours as needed for cough or wheeze You may use albuterol 2 puffs 5 to 15  minutes before activity to decrease cough or wheeze  Allergic rhinitis Your skin testing at today's visit was positive to dog, cockroach, and dust mites. Avoidance measures are listed below Continue an antihistamine once a day as needed for runny nose or itch. Remember to rotate to a different antihistamine about every 3 months. Some examples of over the counter antihistamines include Zyrtec (cetirizine), Xyzal (levocetirizine), Allegra (fexofenadine), and Claritin (loratidine).  Continue Flonase 2 sprays in each nostril once a day as needed for a stuffy nose.  In the right nostril, point the applicator out toward the right ear. In the left nostril, point the applicator out toward the left ear Consider saline nasal rinses as  needed for nasal symptoms. Use this before any medicated nasal sprays for best result  Reflux Continue dietary and lifestyle modifications as listed below Continue pantoprazole once a day as previously prescribed.  Take this medication at least 30 minutes before your first meal for best results  Call the clinic if this treatment plan is not working well for you.  Follow up in 2 months or sooner if needed.   Return in about 2 months (around 05/04/2023), or if symptoms worsen or fail to improve.    Thank you for the opportunity to care for this patient.  Please do not hesitate to contact me with questions.  Gareth Morgan, FNP [facility] [place]

## 2023-03-06 NOTE — Progress Notes (Signed)
Potassium is better   but calcium back up    not sure why   avoid calcium supplement for now .   Check ionized  calcium level ( dx elevated calcium level) in   2-3 weeks . To follow

## 2023-03-07 ENCOUNTER — Other Ambulatory Visit: Payer: Self-pay

## 2023-03-12 ENCOUNTER — Encounter: Payer: Self-pay | Admitting: Allergy & Immunology

## 2023-03-21 ENCOUNTER — Other Ambulatory Visit: Payer: Medicare Other

## 2023-03-22 ENCOUNTER — Other Ambulatory Visit: Payer: Self-pay | Admitting: Internal Medicine

## 2023-03-22 LAB — CALCIUM, IONIZED: Calcium, Ion: 5.7 mg/dL — ABNORMAL HIGH (ref 4.7–5.5)

## 2023-04-01 ENCOUNTER — Telehealth: Payer: Self-pay | Admitting: Internal Medicine

## 2023-04-01 NOTE — Telephone Encounter (Signed)
Pt requested a call from someone to go over her lab results with her.

## 2023-04-03 NOTE — Telephone Encounter (Signed)
See result note  apology about delay . In response .

## 2023-04-03 NOTE — Progress Notes (Signed)
So the calcium is still slightly elevated   sometimes a hormonal issue  or excretion  hereditary issue or a med effect  fortunately not very elevated  so uncertain if significant  to your health .  At this point lets get  a  endocrinology consult . ( Please do referral)  (You Had normal   parathyroid hormone when dr Katrinka Blazing checked ) Apology about the delay .

## 2023-04-04 ENCOUNTER — Other Ambulatory Visit: Payer: Self-pay

## 2023-04-05 DIAGNOSIS — M542 Cervicalgia: Secondary | ICD-10-CM | POA: Diagnosis not present

## 2023-04-05 NOTE — Telephone Encounter (Signed)
Already spoke to pt. Please see lab note.

## 2023-04-11 NOTE — Progress Notes (Signed)
Cannot tell  could be CTS  have her make appt for in person visit . To help for fu

## 2023-04-15 NOTE — Progress Notes (Unsigned)
[name] [address] [place] [address] Phone: [phone] Subjective:   [name], am serving as a scribe for Dr. Antoine Primas.  I'm seeing this patient by the request  of:  Panosh, Neta Mends, MD  CC:   [id number]  02/21/2023 Subacromial bursitis. Discussed home exercises and icing regimen. Hold on any injection today. Given home exercises. Follow-up again in 6 to 8 weeks   Patient does have some loss of lordosis noted.  Patient could have some more of a lumbar radiculopathy.  Patient will increase activity slowly.  Patient is worried about the leg pain we will get a Doppler to further evaluate but patient did have an elevated D-dimer from the emergency room patient CT low-dose scan of the chest was unremarkable.  Patient is still concerned that she may have a DVT and will get the Doppler.  I am concerned more from the back and we can consider that.  Patient declined any type of medication at the moment.     Updated 04/16/2023 Kaylee Adams is a 72 y.o. female coming in with complaint of back and shoulder pain. Doing okay. Shoulder is doing better. Back still having some flare ups, but take ibuprofen and that helps a little. Leg is bothersome, but able to get around. Was having some numbness and tingling in mostly L hand. PCP increased gabapentin.       Past Medical History:  Diagnosis Date   Allergy    occ takes otc allergy pill   Arthritis    CAD (coronary artery disease)    a. 11/2005 Cath: nonobs dzs, 30% LAD lesion on cath ;  b. 11/2005 Echo: NL EF;  c. 04/2012 Echo: EF 55-60%, Gr 2 DD;  d. 04/2012 Ex MV: EF 72%, ST depression in recovery with NL perfusion imaging - HTN response to exercise.   Chest pressure "fatigue" 05/08/2012   GERD (gastroesophageal reflux disease)    occ prilosec OTC   Hepatic cyst    on ct [place]   HLD (hyperlipidemia)    HTN (hypertension)    Midsternal chest pain    cardiology eval with non-ischemic stress  test 04/2012   Upper airway cough syndrome 04/29/2015   Trial of max gerd rx 04/29/2015 >>>     Urticaria    Past Surgical History:  Procedure Laterality Date   ABDOMINAL HYSTERECTOMY     fibroid tumors   BREAST BIOPSY Right    BREAST EXCISIONAL BIOPSY Right    child birth x 5     CHOLECYSTECTOMY     COLONOSCOPY  01/09/2008   SHOULDER ARTHROSCOPY WITH DISTAL CLAVICLE RESECTION Left 11/29/2022   Procedure: SHOULDER ARTHROSCOPY WITH DISTAL CLAVICLE EXCISION;  Surgeon: Bjorn Pippin, MD;  Location: [facility];  Service: Orthopedics;  Laterality: Left;   SHOULDER ARTHROSCOPY WITH ROTATOR CUFF REPAIR AND SUBACROMIAL DECOMPRESSION Right 11/03/2013   Procedure: RIGHT SHOULDER ARTHROSCOPY WITH DEBRIDEMENT, ROTATOR CUFF REPAIR AND SUBACROMIAL DECOMPRESSION, DISTAL CLAVICLE RESECTION;  Surgeon: Kathryne Hitch, MD;  Location: [facility];  Service: Orthopedics;  Laterality: Right;   SHOULDER ARTHROSCOPY WITH SUBACROMIAL DECOMPRESSION, ROTATOR CUFF REPAIR AND BICEP TENDON REPAIR Left 11/29/2022   Procedure: SHOULDER ARTHROSCOPY WITH SUBACROMIAL DECOMPRESSION, ROTATOR CUFF REPAIR AND BICEP TENDON REPAIR;  Surgeon: Bjorn Pippin, MD;  Location: [facility];  Service: Orthopedics;  Laterality: Left;   VESICOVAGINAL FISTULA CLOSURE W/ TAH     for fibroid tumors   Social History  Socioeconomic History   Marital status: Married    Spouse name: Not on file   Number of children: 5   Years of education: Not on file   Highest education level: Not on file  Occupational History    Employer: [employer]  Tobacco Use   Smoking status: Never    Passive exposure: Never   Smokeless tobacco: Never  Vaping Use   Vaping Use: Never used  Substance and Sexual Activity   Alcohol use: No   Drug use: No   Sexual activity: Not Currently    Birth control/protection: Surgical    Comment: Hyst  Other Topics Concern   Not on file  Social History Narrative   Married, full time  Diplomatic Services operational officer. 3 pets    [place]   6-8 hours sleep    Right Handed   Lives in a two story home.    Drinks 1 cup of Coffee   Social Determinants of Health   Financial Resource Strain: Low Risk  (08/16/2021)   Overall Financial Resource Strain (CARDIA)    Difficulty of Paying Living Expenses: Not hard at all  Food Insecurity: No Food Insecurity (08/02/2020)   Hunger Vital Sign    Worried About Running Out of Food in the Last Year: Never true    Ran Out of Food in the Last Year: Never true  Transportation Needs: No Transportation Needs (08/16/2021)   PRAPARE - Administrator, Civil Service (Medical): No    Lack of Transportation (Non-Medical): No  Physical Activity: Insufficiently Active (08/02/2020)   Exercise Vital Sign    Days of Exercise per Week: 3 days    Minutes of Exercise per Session: 30 min  Stress: Stress Concern Present (08/02/2020)   [employer] of Occupational Health - Occupational Stress Questionnaire    Feeling of Stress : To some extent  Social Connections: Moderately Integrated (08/02/2020)   Social Connection and Isolation Panel [NHANES]    Frequency of Communication with Friends and Family: More than three times a week    Frequency of Social Gatherings with Friends and Family: Three times a week    Attends Religious Services: Never    Active Member of Clubs or Organizations: Yes    Attends Engineer, structural: More than 4 times per year    Marital Status: Married   Allergies  Allergen Reactions   Lisinopril     REACTION: cough   Penicillins Nausea Only   Family History  Problem Relation Age of Onset   Breast cancer Sister    Thyroid disease Sister    Hypertension Mother    Colon cancer Brother 11       dx'd about age 32 per pt-    Prostate cancer Brother    Lung cancer Brother    Liver cancer Brother    ALS Brother    Healthy Daughter    Healthy Daughter    Healthy Son    Healthy Son    Healthy Son    Colon polyps Neg Hx     Esophageal cancer Neg Hx    Rectal cancer Neg Hx    Stomach cancer Neg Hx     Current Outpatient Medications (Endocrine & Metabolic):    predniSONE (DELTASONE) 20 MG tablet, Take 1 tablet (20 mg total) by mouth daily with breakfast.  Current Outpatient Medications (Cardiovascular):    amLODipine (NORVASC) 10 MG tablet, TAKE 1 TABLET BY MOUTH EVERY DAY   losartan-hydrochlorothiazide (HYZAAR) 100-25 MG tablet, TAKE  1 TABLET BY MOUTH EVERY DAY  Current Outpatient Medications (Respiratory):    albuterol (VENTOLIN HFA) 108 (90 Base) MCG/ACT inhaler, Inhale 1-2 puffs into the lungs every 6 (six) hours as needed for wheezing or shortness of breath.   fluticasone (FLONASE) 50 MCG/ACT nasal spray,   Current Outpatient Medications (Analgesics):    aspirin 81 MG chewable tablet, Chew by mouth daily.  Current Outpatient Medications (Hematological):    Cyanocobalamin (VITAMIN B-12 PO), Take 5,000 mcg by mouth. Liquid  Current Outpatient Medications (Other):    ascorbic acid (VITAMIN C) 500 MG tablet, Take 1 tablet by mouth daily.   blood glucose meter kit and supplies KIT, Dispense based on patient and insurance preference. Use up to four times daily as directed.   doxycycline (VIBRAMYCIN) 100 MG capsule, Take 1 capsule (100 mg total) by mouth 2 (two) times daily.   gabapentin (NEURONTIN) 100 MG capsule, TAKE 2 CAPSULES BY MOUTH AT BEDTIME.   MAGNESIUM PO, Take by mouth daily.   MULTIPLE VITAMIN PO, Take 1 capsule by mouth daily.   omeprazole (PRILOSEC OTC) 20 MG tablet, Take 20 mg by mouth daily.   Vitamin D, Ergocalciferol, (DRISDOL) 1.25 MG (50000 UNIT) CAPS capsule, Take 1 capsule (50,000 Units total) by mouth every 7 (seven) days.   Objective  Blood pressure 126/64, pulse 85, height  (1.499 m), weight 166 lb (75.3 kg), SpO2 96 %.   General: No apparent distress alert and oriented x3 mood and affect normal, dressed appropriately.  HEENT: Pupils equal, extraocular movements intact   Respiratory: Patient's speak in full sentences and does not appear short of breath  Cardiovascular: No lower extremity edema, non tender, no erythema   Left shoulder does have improvement in range of motion.  Patient does have improvement in strength noted as well. Low back exam does have some very mild loss of lordosis.  Negative straight leg test noted today.    Impression and Recommendations:     The above documentation has been reviewed and is accurate and complete Judi Saa, DO

## 2023-04-16 ENCOUNTER — Encounter: Payer: Self-pay | Admitting: Family Medicine

## 2023-04-16 ENCOUNTER — Ambulatory Visit (INDEPENDENT_AMBULATORY_CARE_PROVIDER_SITE_OTHER): Payer: Medicare Other | Admitting: Family Medicine

## 2023-04-16 VITALS — BP 126/64 | HR 85 | Ht 59.0 in | Wt 166.0 lb

## 2023-04-16 DIAGNOSIS — M75112 Incomplete rotator cuff tear or rupture of left shoulder, not specified as traumatic: Secondary | ICD-10-CM

## 2023-04-16 NOTE — Assessment & Plan Note (Signed)
Seems significantly better at this time.  No other changes in management at this moment.  Discussed icing regimen and home exercises and will follow-up with me in 3 months

## 2023-04-16 NOTE — Patient Instructions (Signed)
Glad you're doing great Have na appointment in 3 months just in case

## 2023-04-17 ENCOUNTER — Ambulatory Visit (INDEPENDENT_AMBULATORY_CARE_PROVIDER_SITE_OTHER): Payer: Medicare Other | Admitting: Internal Medicine

## 2023-04-17 ENCOUNTER — Encounter: Payer: Self-pay | Admitting: Internal Medicine

## 2023-04-17 ENCOUNTER — Telehealth: Payer: Self-pay

## 2023-04-17 DIAGNOSIS — I10 Essential (primary) hypertension: Secondary | ICD-10-CM

## 2023-04-17 MED ORDER — LOSARTAN POTASSIUM 100 MG PO TABS: 100.0000 mg | ORAL_TABLET | Freq: Every day | ORAL | 1 refills | Status: DC

## 2023-04-17 NOTE — Patient Instructions (Addendum)
Change bp medication stopping the  hctz in case elevating the calcium level.  Send in BP readings in about 2 -3 weeks after the change.  YOUr BP readings have been good.  Then will plan on updating  blood work calcium and thryoid  any other needed .  Still plan on endocrinology consult.

## 2023-04-17 NOTE — Progress Notes (Signed)
Chief Complaint  Patient presents with   Follow-up    On lab result.     HPI: Kaylee Adams 72 y.o. come in for fu  elevated calcium On going off and on . No hx stones fam hx . HT she is on amlodipine and losartan hctz   No extra calcium  no sx  ROS: See pertinent positives and negatives per HPI.  Past Medical History:  Diagnosis Date   Allergy    occ takes otc allergy pill   Arthritis    CAD (coronary artery disease)    a. 11/2005 Cath: nonobs dzs, 30% LAD lesion on cath ;  b. 11/2005 Echo: NL EF;  c. 04/2012 Echo: EF 55-60%, Gr 2 DD;  d. 04/2012 Ex MV: EF 72%, ST depression in recovery with NL perfusion imaging - HTN response to exercise.   Chest pressure "fatigue" 05/08/2012   GERD (gastroesophageal reflux disease)    occ prilosec OTC   Hepatic cyst    on ct [place]   HLD (hyperlipidemia)    HTN (hypertension)    Midsternal chest pain    cardiology eval with non-ischemic stress test 04/2012   Upper airway cough syndrome 04/29/2015   Trial of max gerd rx 04/29/2015 >>>     Urticaria     Family History  Problem Relation Age of Onset   Breast cancer Sister    Thyroid disease Sister    Hypertension Mother    Colon cancer Brother 10       dx'd about age 41 per pt-    Prostate cancer Brother    Lung cancer Brother    Liver cancer Brother    ALS Brother    Healthy Daughter    Healthy Daughter    Healthy Son    Healthy Son    Healthy Son    Colon polyps Neg Hx    Esophageal cancer Neg Hx    Rectal cancer Neg Hx    Stomach cancer Neg Hx     Social History   Socioeconomic History   Marital status: Married    Spouse name: Not on file   Number of children: 5   Years of education: Not on file   Highest education level: Not on file  Occupational History    Employer: [employer]  Tobacco Use   Smoking status: Never    Passive exposure: Never   Smokeless tobacco: Never  Vaping Use   Vaping Use: Never used  Substance and Sexual Activity   Alcohol use: No    Drug use: No   Sexual activity: Not Currently    Birth control/protection: Surgical    Comment: Hyst  Other Topics Concern   Not on file  Social History Narrative   Married, full time Diplomatic Services operational officer. 3 pets    [place]   6-8 hours sleep    Right Handed   Lives in a two story home.    Drinks 1 cup of Coffee   Social Determinants of Health   Financial Resource Strain: Low Risk  (08/16/2021)   Overall Financial Resource Strain (CARDIA)    Difficulty of Paying Living Expenses: Not hard at all  Food Insecurity: No Food Insecurity (08/02/2020)   Hunger Vital Sign    Worried About Running Out of Food in the Last Year: Never true    Ran Out of Food in the Last Year: Never true  Transportation Needs: No Transportation Needs (08/16/2021)   PRAPARE - Transportation  Lack of Transportation (Medical): No    Lack of Transportation (Non-Medical): No  Physical Activity: Insufficiently Active (08/02/2020)   Exercise Vital Sign    Days of Exercise per Week: 3 days    Minutes of Exercise per Session: 30 min  Stress: Stress Concern Present (08/02/2020)   [employer] of Occupational Health - Occupational Stress Questionnaire    Feeling of Stress : To some extent  Social Connections: Moderately Integrated (08/02/2020)   Social Connection and Isolation Panel [NHANES]    Frequency of Communication with Friends and Family: More than three times a week    Frequency of Social Gatherings with Friends and Family: Three times a week    Attends Religious Services: Never    Active Member of Clubs or Organizations: Yes    Attends Engineer, structural: More than 4 times per year    Marital Status: Married    Outpatient Medications Prior to Visit  Medication Sig Dispense Refill   albuterol (VENTOLIN HFA) 108 (90 Base) MCG/ACT inhaler Inhale 1-2 puffs into the lungs every 6 (six) hours as needed for wheezing or shortness of breath. 1 each 0   amLODipine (NORVASC) 10 MG tablet TAKE 1 TABLET BY  MOUTH EVERY DAY 90 tablet 1   ascorbic acid (VITAMIN C) 500 MG tablet Take 1 tablet by mouth daily.     aspirin 81 MG chewable tablet Chew by mouth daily.     Cyanocobalamin (VITAMIN B-12 PO) Take 5,000 mcg by mouth. Liquid     Fexofenadine HCl (ALLEGRA PO) Take by mouth.     fluticasone (FLONASE) 50 MCG/ACT nasal spray      gabapentin (NEURONTIN) 100 MG capsule TAKE 2 CAPSULES BY MOUTH AT BEDTIME. (Patient taking differently: Take 200 mg by mouth at bedtime. Take 1 tab in a.m, 1 at noon, 2 tabs at night.) 180 capsule 3   MULTIPLE VITAMIN PO Take 1 capsule by mouth daily.     omeprazole (PRILOSEC OTC) 20 MG tablet Take 20 mg by mouth daily.     Vitamin D, Ergocalciferol, (DRISDOL) 1.25 MG (50000 UNIT) CAPS capsule Take 1 capsule (50,000 Units total) by mouth every 7 (seven) days. 12 capsule 0   losartan-hydrochlorothiazide (HYZAAR) 100-25 MG tablet TAKE 1 TABLET BY MOUTH EVERY DAY 90 tablet 1   MAGNESIUM PO Take by mouth daily. (Patient not taking: Reported on 04/17/2023)     blood glucose meter kit and supplies KIT Dispense based on patient and insurance preference. Use up to four times daily as directed. 1 each 0   doxycycline (VIBRAMYCIN) 100 MG capsule Take 1 capsule (100 mg total) by mouth 2 (two) times daily. 20 capsule 0   predniSONE (DELTASONE) 20 MG tablet Take 1 tablet (20 mg total) by mouth daily with breakfast. 5 tablet 0   No facility-administered medications prior to visit.     EXAM:  BP (!) 126/54 (BP Location: Left Arm, Patient Position: Sitting, Cuff Size: Large)   Pulse 81   Temp 98.1 F (36.7 C) (Oral)   Ht 4' 11.25" (1.505 m)   Wt 163 lb 12.8 oz (74.3 kg)   SpO2 98%   BMI 32.80 kg/m   Body mass index is 32.8 kg/m.  GENERAL: vitals reviewed and listed above, alert, oriented, appears well hydrated and in no acute distress HEENT: atraumatic, conjunctiva  clear, no obvious abnormalities on inspection of external nose and ears  NECK: no obvious masses on inspection  palpation   MS: moves all extremities without noticeable  focal  abnormality PSYCH: pleasant and cooperative, no obvious depression or anxiety Lab Results  Component Value Date   WBC 7.4 02/09/2023   HGB 13.2 02/09/2023   HCT 41.6 02/09/2023   PLT 345 02/09/2023   GLUCOSE 77 02/13/2023   CHOL 227 (H) 05/01/2022   TRIG 90.0 05/01/2022   HDL 59.30 05/01/2022   LDLDIRECT 153.2 06/23/2012   LDLCALC 150 (H) 05/01/2022   ALT 18 12/26/2022   AST 20 12/26/2022   NA 143 02/13/2023   K 3.7 02/13/2023   CL 108 02/13/2023   CREATININE 0.80 02/13/2023   BUN 15 02/13/2023   CO2 22 02/13/2023   TSH 0.64 05/01/2022   INR 0.9 11/29/2008   HGBA1C 5.6 05/01/2022   BP Readings from Last 3 Encounters:  04/17/23 (!) 126/54  04/16/23 126/64  03/04/23 120/62    ASSESSMENT AND PLAN:  Discussed the following assessment and plan:  Hypercalcemia  Essential hypertension Bp is controlled   she has been on hctz ; will rearrange meds  and see if bp control off of hctz and  calcium   if bp not controlled  will readjust meds  Elevation not alarming   but persistent    Expectant management.  -Patient advised to return or notify health care team  if  new concerns arise.  Patient Instructions  Change bp medication stopping the  hctz in case elevating the calcium level.  Send in BP readings in about 2 -3 weeks after the change.  YOUr BP readings have been good.  Then will plan on updating  blood work calcium and thryoid  any other needed .  Still plan on endocrinology consult.    Neta Mends. Kaylee Adams M.D.

## 2023-04-21 NOTE — Assessment & Plan Note (Signed)
Stop the hctz  to see if calcium normalizes and fu readings  may need to change to valsartan or add mediction bystolic ?

## 2023-04-21 NOTE — Assessment & Plan Note (Signed)
Stop the hctz   still get endo consult

## 2023-04-24 ENCOUNTER — Telehealth: Payer: Self-pay | Admitting: Internal Medicine

## 2023-04-24 DIAGNOSIS — J3081 Allergic rhinitis due to animal (cat) (dog) hair and dander: Secondary | ICD-10-CM | POA: Diagnosis not present

## 2023-04-24 NOTE — Telephone Encounter (Signed)
Called patient to schedule Medicare Annual Wellness Visit (AWV). No voicemail available to leave a message.  Last date of AWV: 08/02/20  Please schedule an appointment at any time with [facility] or [employer].  If any questions, please contact me at [phone].  Thank you ,  Rudell Cobb AWV direct phone # [phone]

## 2023-04-29 DIAGNOSIS — Z01419 Encounter for gynecological examination (general) (routine) without abnormal findings: Secondary | ICD-10-CM | POA: Diagnosis not present

## 2023-04-29 DIAGNOSIS — Z6832 Body mass index (BMI) 32.0-32.9, adult: Secondary | ICD-10-CM | POA: Diagnosis not present

## 2023-04-29 DIAGNOSIS — Z1231 Encounter for screening mammogram for malignant neoplasm of breast: Secondary | ICD-10-CM | POA: Diagnosis not present

## 2023-05-14 DIAGNOSIS — M542 Cervicalgia: Secondary | ICD-10-CM | POA: Diagnosis not present

## 2023-05-14 DIAGNOSIS — M25532 Pain in left wrist: Secondary | ICD-10-CM | POA: Diagnosis not present

## 2023-05-16 ENCOUNTER — Telehealth: Payer: Self-pay | Admitting: Family Medicine

## 2023-05-16 NOTE — Telephone Encounter (Signed)
Patient called asking if Dr Katrinka Blazing would be able complete another handicap placard for her?  Please advise.  (Patient would like a call when it is ready to pick up)

## 2023-05-16 NOTE — Telephone Encounter (Signed)
Spoke with patient. She is going to have someone come pick up the form for her. Placed at front desk.

## 2023-06-04 ENCOUNTER — Ambulatory Visit: Payer: Medicare Other | Admitting: Allergy & Immunology

## 2023-06-14 ENCOUNTER — Telehealth: Payer: Self-pay

## 2023-06-17 NOTE — Telephone Encounter (Signed)
Kaylee Adams Ann Geraldine Tesar, CMA  ?

## 2023-06-18 ENCOUNTER — Ambulatory Visit (INDEPENDENT_AMBULATORY_CARE_PROVIDER_SITE_OTHER): Payer: Medicare Other | Admitting: Internal Medicine

## 2023-06-18 ENCOUNTER — Other Ambulatory Visit (INDEPENDENT_AMBULATORY_CARE_PROVIDER_SITE_OTHER): Payer: Medicare Other

## 2023-06-18 ENCOUNTER — Encounter: Payer: Self-pay | Admitting: Internal Medicine

## 2023-06-18 VITALS — BP 138/70 | HR 73 | Temp 98.2°F | Ht 60.0 in | Wt 162.8 lb

## 2023-06-18 DIAGNOSIS — I10 Essential (primary) hypertension: Secondary | ICD-10-CM

## 2023-06-18 DIAGNOSIS — M2550 Pain in unspecified joint: Secondary | ICD-10-CM

## 2023-06-18 DIAGNOSIS — Z79899 Other long term (current) drug therapy: Secondary | ICD-10-CM

## 2023-06-18 DIAGNOSIS — E785 Hyperlipidemia, unspecified: Secondary | ICD-10-CM

## 2023-06-18 DIAGNOSIS — M25552 Pain in left hip: Secondary | ICD-10-CM

## 2023-06-18 LAB — RENAL FUNCTION PANEL
Albumin: 4 g/dL (ref 3.5–5.2)
BUN: 18 mg/dL (ref 6–23)
CO2: 29 mEq/L (ref 19–32)
Calcium: 10.6 mg/dL — ABNORMAL HIGH (ref 8.4–10.5)
Chloride: 105 mEq/L (ref 96–112)
Creatinine, Ser: 0.73 mg/dL (ref 0.40–1.20)
GFR: 82.73 mL/min (ref >60.00)
Glucose, Bld: 73 mg/dL (ref 70–99)
Phosphorus: 3.3 mg/dL (ref 2.3–4.6)
Potassium: 4.2 mEq/L (ref 3.5–5.1)
Sodium: 141 mEq/L (ref 135–145)

## 2023-06-18 LAB — MAGNESIUM: Magnesium: 2.2 mg/dL (ref 1.5–2.5)

## 2023-06-18 LAB — VITAMIN D 25 HYDROXY (VIT D DEFICIENCY, FRACTURES): VITD: 29.53 ng/mL — ABNORMAL LOW (ref 30.00–100.00)

## 2023-06-18 MED ORDER — ROSUVASTATIN CALCIUM 5 MG PO TABS
5.0000 mg | ORAL_TABLET | Freq: Every day | ORAL | 1 refills | Status: DC
Start: 2023-06-18 — End: 2023-12-15

## 2023-06-18 MED ORDER — AMLODIPINE BESYLATE 10 MG PO TABS: 10.0000 mg | ORAL_TABLET | Freq: Every day | ORAL | 3 refills | Status: AC

## 2023-06-18 NOTE — Progress Notes (Signed)
Chief Complaint  Patient presents with   Annual Exam    HPI: Patient  Kaylee Adams  72 y.o. comes in today for yearly visit   Bp seems ok needs refill amlodipine.  Back and hip is limiting   seeing sm  in past did do water exercise   Hld never took atorva worried that could cause ms sx since she has so many  not sure   In process of eval for persistent hypercalcemia  had blood tests today . And eval this week. Had tooth abscess x 2   Health Maintenance  Topic Date Due   Medicare Annual Wellness (AWV)  08/02/2021   COVID-19 Vaccine (4 - 2023-24 season) 07/04/2023 (Originally 08/24/2022)   MAMMOGRAM  06/17/2024 (Originally 02/22/2022)   INFLUENZA VACCINE  07/25/2023   Colonoscopy  09/09/2024   DTaP/Tdap/Td (2 - Td or Tdap) 02/01/2027   Pneumonia Vaccine 59+ Years old  Completed   DEXA SCAN  Completed   Hepatitis C Screening  Completed   Zoster Vaccines- Shingrix  Completed   HPV VACCINES  Aged Out   Health Maintenance Review LIFESTYLE:  Exercise:  limited currently Tobacco/ETS:n Alcohol: n Sugar beverages: homey in coffee  not often others  Sleep: 7 hours  Drug use: no HH of 2  no pets      ROS:  REST of 12 system review negative except as per HPI   Past Medical History:  Diagnosis Date   Allergy    occ takes otc allergy pill   Arthritis    CAD (coronary artery disease)    a. 11/2005 Cath: nonobs dzs, 30% LAD lesion on cath ;  b. 11/2005 Echo: NL EF;  c. 04/2012 Echo: EF 55-60%, Gr 2 DD;  d. 04/2012 Ex MV: EF 72%, ST depression in recovery with NL perfusion imaging - HTN response to exercise.   Chest pressure "fatigue" 05/08/2012   GERD (gastroesophageal reflux disease)    occ prilosec OTC   Hepatic cyst    on ct [place]   HLD (hyperlipidemia)    HTN (hypertension)    Midsternal chest pain    cardiology eval with non-ischemic stress test 04/2012   Upper airway cough syndrome 04/29/2015   Trial of max gerd rx 04/29/2015 >>>     Urticaria     Past Surgical  History:  Procedure Laterality Date   ABDOMINAL HYSTERECTOMY     fibroid tumors   BREAST BIOPSY Right    BREAST EXCISIONAL BIOPSY Right    child birth x 5     CHOLECYSTECTOMY     COLONOSCOPY  01/09/2008   SHOULDER ARTHROSCOPY WITH DISTAL CLAVICLE RESECTION Left 11/29/2022   Procedure: SHOULDER ARTHROSCOPY WITH DISTAL CLAVICLE EXCISION;  Surgeon: Bjorn Pippin, MD;  Location: [facility];  Service: Orthopedics;  Laterality: Left;   SHOULDER ARTHROSCOPY WITH ROTATOR CUFF REPAIR AND SUBACROMIAL DECOMPRESSION Right 11/03/2013   Procedure: RIGHT SHOULDER ARTHROSCOPY WITH DEBRIDEMENT, ROTATOR CUFF REPAIR AND SUBACROMIAL DECOMPRESSION, DISTAL CLAVICLE RESECTION;  Surgeon: Kathryne Hitch, MD;  Location: [facility];  Service: Orthopedics;  Laterality: Right;   SHOULDER ARTHROSCOPY WITH SUBACROMIAL DECOMPRESSION, ROTATOR CUFF REPAIR AND BICEP TENDON REPAIR Left 11/29/2022   Procedure: SHOULDER ARTHROSCOPY WITH SUBACROMIAL DECOMPRESSION, ROTATOR CUFF REPAIR AND BICEP TENDON REPAIR;  Surgeon: Bjorn Pippin, MD;  Location: [facility];  Service: Orthopedics;  Laterality: Left;   VESICOVAGINAL FISTULA CLOSURE W/ TAH     for fibroid tumors    Family History  Problem  Relation Age of Onset   Breast cancer Sister    Thyroid disease Sister    Hypertension Mother    Colon cancer Brother 73       dx'd about age 40 per pt-    Prostate cancer Brother    Lung cancer Brother    Liver cancer Brother    ALS Brother    Healthy Daughter    Healthy Daughter    Healthy Son    Healthy Son    Healthy Son    Colon polyps Neg Hx    Esophageal cancer Neg Hx    Rectal cancer Neg Hx    Stomach cancer Neg Hx     Social History   Socioeconomic History   Marital status: Married    Spouse name: Not on file   Number of children: 5   Years of education: Not on file   Highest education level: Not on file  Occupational History    Employer: [employer]  Tobacco Use   Smoking  status: Never    Passive exposure: Never   Smokeless tobacco: Never  Vaping Use   Vaping Use: Never used  Substance and Sexual Activity   Alcohol use: No   Drug use: No   Sexual activity: Not Currently    Birth control/protection: Surgical    Comment: Hyst  Other Topics Concern   Not on file  Social History Narrative   Married, full time Diplomatic Services operational officer. 3 pets    [place]   6-8 hours sleep    Right Handed   Lives in a two story home.    Drinks 1 cup of Coffee   Social Determinants of Health   Financial Resource Strain: Low Risk  (08/16/2021)   Overall Financial Resource Strain (CARDIA)    Difficulty of Paying Living Expenses: Not hard at all  Food Insecurity: No Food Insecurity (08/02/2020)   Hunger Vital Sign    Worried About Running Out of Food in the Last Year: Never true    Ran Out of Food in the Last Year: Never true  Transportation Needs: No Transportation Needs (08/16/2021)   PRAPARE - Administrator, Civil Service (Medical): No    Lack of Transportation (Non-Medical): No  Physical Activity: Insufficiently Active (06/18/2023)   Exercise Vital Sign    Days of Exercise per Week: 3 days    Minutes of Exercise per Session: 30 min  Stress: No Stress Concern Present (06/18/2023)   [employer] of Occupational Health - Occupational Stress Questionnaire    Feeling of Stress : Not at all  Social Connections: Moderately Integrated (08/02/2020)   Social Connection and Isolation Panel [NHANES]    Frequency of Communication with Friends and Family: More than three times a week    Frequency of Social Gatherings with Friends and Family: Three times a week    Attends Religious Services: Never    Active Member of Clubs or Organizations: Yes    Attends Engineer, structural: More than 4 times per year    Marital Status: Married    Outpatient Medications Prior to Visit  Medication Sig Dispense Refill   albuterol (VENTOLIN HFA) 108 (90 Base) MCG/ACT inhaler  Inhale 1-2 puffs into the lungs every 6 (six) hours as needed for wheezing or shortness of breath. 1 each 0   ascorbic acid (VITAMIN C) 500 MG tablet Take 1 tablet by mouth daily.     aspirin 81 MG chewable tablet Chew by mouth daily.  Cyanocobalamin (VITAMIN B-12 PO) Take 5,000 mcg by mouth. Liquid     Fexofenadine HCl (ALLEGRA PO) Take by mouth.     fluticasone (FLONASE) 50 MCG/ACT nasal spray      gabapentin (NEURONTIN) 100 MG capsule TAKE 2 CAPSULES BY MOUTH AT BEDTIME. (Patient taking differently: Take 200 mg by mouth at bedtime. Take 1 tab in a.m, 1 at noon, 2 tabs at night.) 180 capsule 3   losartan (COZAAR) 100 MG tablet Take 1 tablet (100 mg total) by mouth daily. Instead of losartanhctz 90 tablet 1   MAGNESIUM PO Take by mouth daily.     MULTIPLE VITAMIN PO Take 1 capsule by mouth daily.     omeprazole (PRILOSEC OTC) 20 MG tablet Take 20 mg by mouth daily.     Vitamin D, Ergocalciferol, (DRISDOL) 1.25 MG (50000 UNIT) CAPS capsule Take 1 capsule (50,000 Units total) by mouth every 7 (seven) days. 12 capsule 0   amLODipine (NORVASC) 10 MG tablet TAKE 1 TABLET BY MOUTH EVERY DAY 90 tablet 1   No facility-administered medications prior to visit.     EXAM:  BP 138/70   Pulse 73   Temp 98.2 F (36.8 C) (Oral)   Ht 5' (1.524 m)   Wt 162 lb 12.8 oz (73.8 kg)   SpO2 99%   BMI 31.79 kg/m   Body mass index is 31.79 kg/m. Wt Readings from Last 3 Encounters:  06/18/23 162 lb 12.8 oz (73.8 kg)  04/17/23 163 lb 12.8 oz (74.3 kg)  04/16/23 166 lb (75.3 kg)    Physical Exam: Vital signs reviewed IRJ:JOAC is a well-developed well-nourished alert cooperative    who appearsr stated age in no acute distress.  HEENT: normocephalic atraumatic , Eyes: PERRL EOM's full, conjunctiva clear, Nares: paten,t no deformity discharge or tenderness., Ears: no deformity EAC's clear TMs with normal landmarks. Mouth: clear OP, no lesions, edema.  Moist mucous membranes. Dentition in adequate  repair. NECK: supple without masses, thyromegaly or bruits. CHEST/PULM:  Clear to auscultation and percussion breath sounds equal no wheeze , rales or rhonchi. No chest wall deformities or tenderness. Breast: normal by inspection . No dimpling, discharge, masses, tenderness or discharge . CV: PMI is nondisplaced, S1 S2 no gallops, short sys m only in upright  no radiation upper sb  rubs. Peripheral pulses are full without delay  ABDOMEN: Bowel sounds normal nontender  No guard or rebound, no hepato splenomegal no CVA tenderness.  Extremtities:  No clubbing cyanosis or edema, no acute joint swelling or redness NEURO:  Oriented x3, cranial nerves 3-12 appear to be intact, no obvious focal weakness,gait within normal limits no abnormal reflexes or asymmetrical SKIN: No acute rashes normal turgor, color, no bruising or petechiae. PSYCH: Oriented, good eye contact, no obvious depression anxiety, cognition and judgment appear normal. LN: no cervical axillary adenopathy  Lab Results  Component Value Date   WBC 7.4 02/09/2023   HGB 13.2 02/09/2023   HCT 41.6 02/09/2023   PLT 345 02/09/2023   GLUCOSE 73 06/18/2023   CHOL 227 (H) 05/01/2022   TRIG 90.0 05/01/2022   HDL 59.30 05/01/2022   LDLDIRECT 153.2 06/23/2012   LDLCALC 150 (H) 05/01/2022   ALT 18 12/26/2022   AST 20 12/26/2022   NA 141 06/18/2023   K 4.2 06/18/2023   CL 105 06/18/2023   CREATININE 0.73 06/18/2023   BUN 18 06/18/2023   CO2 29 06/18/2023   TSH 0.64 05/01/2022   INR 0.9 11/29/2008   HGBA1C 5.6  05/01/2022    BP Readings from Last 3 Encounters:  06/18/23 138/70  04/17/23 (!) 126/54  04/16/23 126/64    Lab results reviewed with patient   ASSESSMENT AND PLAN:  Discussed the following assessment and plan:    ICD-10-CM   1. Essential hypertension  I10    controlled    2. Hypercalcemia  E83.52    under eval    3. Hyperlipidemia, unspecified hyperlipidemia type  E78.5     4. Medication management  Z79.899      5. Pain in joint, multiple sites  M25.50    presumed djd ,follow    Under eval for hypercalcemia  Would benefit from statin most likely consider ct coronary score but would go ahead and try med first  after discussion Also inc activity as tolerated  Will send in med  Then  let us know if any se concern  plan fu labs in 3-6 moths or as indicated  Return for depending on results 6- 12 mos .  Patient Care Team: Cully Luckow, Neta Mends, MD as PCP - General Rachael Fee, MD (Gastroenterology) Pricilla Riffle, MD (Cardiology) Glendale Chard, DO as Consulting Physician (Neurology) Verner Chol, [facility] (Inactive) as Pharmacist (Pharmacist) Richardean Chimera, MD as Consulting Physician (Obstetrics and Gynecology) Patient Instructions  Good to see  you today  Consider water  exercise  activity  Will review  record and maybe send in low dose statin medication to try increasing dose slowly ( after labs bacj for hypercalcemia evaluation) ( hold on ct coronary scan)   Plan fu depending on results ,   Neta Mends. Ephriam Turman M.D.

## 2023-06-18 NOTE — Patient Instructions (Addendum)
Good to see  you today  Consider water  exercise  activity  Will review  record and maybe send in low dose statin medication to try increasing dose slowly ( after labs bacj for hypercalcemia evaluation) ( hold on ct coronary scan)   Plan fu depending on results ,

## 2023-06-20 ENCOUNTER — Ambulatory Visit (INDEPENDENT_AMBULATORY_CARE_PROVIDER_SITE_OTHER): Payer: Medicare Other | Admitting: "Endocrinology

## 2023-06-20 ENCOUNTER — Encounter: Payer: Self-pay | Admitting: "Endocrinology

## 2023-06-20 DIAGNOSIS — M8589 Other specified disorders of bone density and structure, multiple sites: Secondary | ICD-10-CM | POA: Diagnosis not present

## 2023-06-20 DIAGNOSIS — E559 Vitamin D deficiency, unspecified: Secondary | ICD-10-CM

## 2023-06-20 NOTE — Patient Instructions (Addendum)
-   Discussed importance of good hydration to prevent severe hypercalcemia:  - 8 eight ounce glasses of fluids per day.  - low threshold for ER visit for IV fluids if having     nausea/vomiting/diarrhea and cannot keep well hydrated.    24 hour urine collection  You should collect every drop of urine during each 24 hour period. It does not matter how much or little urine is passed each time, as long as every drop is collected.   Begin the urine collection in the morning after you wake up.  The first time your empty your bladder, flush it down the toilet because that was yesterday's urine. Write down the exact time (eg, 6:15 AM). You will begin the urine collection at this time.  Every other time you urinate for the rest of the day and through the night, the urine goes into the jug.  Also the first time you empty your bladder the next morning, that goes into the jug.  This should be within 10 minutes before or after the time of the first morning void on the first day (which was flushed).  Bring the jug back to the lab when you have completed this.    If you need to urinate one hour before the final collection time, drink a full glass of water so that you can void again at the appropriate time. If you have to urinate 20 minutes before, try to hold the urine until the proper time.  Please note the exact time of the final collection, even if it is not the same time as when collection began on day one.    Store the jug in the refrigerator.  If you need to have a bowel movement, any urine passed with the bowel movement should be collected. Try not to include feces with the urine collection. If feces does get mixed in, do not try to remove the feces from the urine collection bottle.

## 2023-06-20 NOTE — Progress Notes (Addendum)
Outpatient Endocrinology Note [facility], MD    Kaylee Adams 16-Jul-1951 [id number]  Referring Provider: Madelin Headings, MD Primary Care Provider: Madelin Headings, MD Reason for consultation: Subjective   Assessment & Plan  Kaylee Adams was seen today for hypercalcemia.  Diagnoses and all orders for this visit:  Hypercalcemia -     DG BONE DENSITY (DXA); Future -     Calcium, 24-Hour Urine with Creatinine; Future -     Renal function panel; Future  Vitamin D deficiency -     DG BONE DENSITY (DXA); Future -     Calcium, 24-Hour Urine with Creatinine; Future  Other specified disorders of bone density and structure, multiple sites -     DG BONE DENSITY (DXA); Future   High calcium since 2020, currently at 10.6 in 05/2023 with normal PTH, Mg, GFR>60 Patient is largely asymptomatic  Ordered 24 hour urine and follow up DXA Discussed importance of good hydration to prevent severe hypercalcemia:  - 8 eight ounce glasses of fluids per day.  - low threshold for ER visit for IV fluids if having     nausea/vomiting/diarrhea and cannot keep well hydrated.   Low Vit D at  29.5 Pt takes 50,000 units Vit D weekly-asked to stop it  Start Vit D 1,000 unit every day  Osteopenia per 2020 DXA scan: right femoral neck -1.2, and left femoral neck -1.1 T-score  No history fof fractures Ordered repeat scan   Return in about 4 months (around 10/20/2023) for visit, pick 24 hour urine container from lab today, labs before next visit .   I have reviewed current medications, nurse's notes, allergies, vital signs, past medical and surgical history, family medical history, and social history for this encounter. Counseled patient on symptoms, examination findings, lab findings, imaging results, treatment decisions and monitoring and prognosis. The patient understood the recommendations and agrees with the treatment plan. All questions regarding treatment plan were fully answered.  [facility], MD   06/20/23   History of Present Illness HPI  Kaylee Adams is a 72 y.o. year old female who presents for evaluation of hypercalcemia.  Kaylee Adams was first diagnosed in 2/18/202 with high calcium of 10.6 without albumin.   Current regimen: Vit D 50,000 units weekly   Patient denies a history of kidney stones. She  current hematuria No polyuria Yes nocturia Yes thirst No renal failure No anorexia No abdominal pain No heartburn No constipation No nausea or vomiting No history of peptic ulcer disease No depression No confusion No excessive fatigue No fracture No osteoporosis No headaches Yes numbness Yes tingling No  She takes Calcium No She takes Vitamin D supplements Yes, on 50,000 units Vit D She denies a history of taking chronic lithium  She denies a recent history of thiazide diuretic intake   She denies family history of renal stones/hypercalcemia denies a personal history of MEN syndromes/medullary thyroid cancer/ pheochromocytoma  08/03/2019 DXA scan  Lumbar spine L1-L4 Femoral neck (FN) 33% distal radius   T-score -0.9 RFN: -1.2 LFN: -1.1 n/a   Physical Exam  BP 135/65   Pulse 88   Ht 5' (1.524 m)   Wt 163 lb 12.8 oz (74.3 kg)   SpO2 99%   BMI 31.99 kg/m    Constitutional: well developed, well nourished Head: normocephalic, atraumatic Eyes: sclera anicteric, no redness Neck: supple Lungs: normal respiratory effort Neurology: alert and oriented Skin: dry, no appreciable rashes Musculoskeletal: no appreciable defects Psychiatric: normal  mood and affect   Current Medications Patient's Medications  New Prescriptions   No medications on file  Previous Medications   ALBUTEROL (VENTOLIN HFA) 108 (90 BASE) MCG/ACT INHALER    Inhale 1-2 puffs into the lungs every 6 (six) hours as needed for wheezing or shortness of breath.   AMLODIPINE (NORVASC) 10 MG TABLET    Take 1 tablet (10 mg total) by mouth daily.   ASCORBIC ACID (VITAMIN C) 500 MG  TABLET    Take 1 tablet by mouth daily.   ASPIRIN 81 MG CHEWABLE TABLET    Chew by mouth daily.   CYANOCOBALAMIN (VITAMIN B-12 PO)    Take 5,000 mcg by mouth. Liquid   FEXOFENADINE HCL (ALLEGRA PO)    Take by mouth.   FLUTICASONE (FLONASE) 50 MCG/ACT NASAL SPRAY       GABAPENTIN (NEURONTIN) 100 MG CAPSULE    TAKE 2 CAPSULES BY MOUTH AT BEDTIME.   LOSARTAN (COZAAR) 100 MG TABLET    Take 1 tablet (100 mg total) by mouth daily. Instead of losartanhctz   MAGNESIUM PO    Take by mouth daily.   MULTIPLE VITAMIN PO    Take 1 capsule by mouth daily.   OMEPRAZOLE (PRILOSEC OTC) 20 MG TABLET    Take 20 mg by mouth daily.   ROSUVASTATIN (CRESTOR) 5 MG TABLET    Take 1 tablet (5 mg total) by mouth daily. May begin with 5 mg 3 x per week and increase to 1 po qd   VITAMIN D, ERGOCALCIFEROL, (DRISDOL) 1.25 MG (50000 UNIT) CAPS CAPSULE    Take 1 capsule (50,000 Units total) by mouth every 7 (seven) days.  Modified Medications   No medications on file  Discontinued Medications   No medications on file    Allergies Allergies  Allergen Reactions   Lisinopril     REACTION: cough   Penicillins Nausea Only    Past Medical History Past Medical History:  Diagnosis Date   Allergy    occ takes otc allergy pill   Arthritis    CAD (coronary artery disease)    a. 11/2005 Cath: nonobs dzs, 30% LAD lesion on cath ;  b. 11/2005 Echo: NL EF;  c. 04/2012 Echo: EF 55-60%, Gr 2 DD;  d. 04/2012 Ex MV: EF 72%, ST depression in recovery with NL perfusion imaging - HTN response to exercise.   Chest pressure "fatigue" 05/08/2012   GERD (gastroesophageal reflux disease)    occ prilosec OTC   Hepatic cyst    on ct [place]   HLD (hyperlipidemia)    HTN (hypertension)    Midsternal chest pain    cardiology eval with non-ischemic stress test 04/2012   Upper airway cough syndrome 04/29/2015   Trial of max gerd rx 04/29/2015 >>>     Urticaria     Past Surgical History Past Surgical History:  Procedure Laterality Date    ABDOMINAL HYSTERECTOMY     fibroid tumors   BREAST BIOPSY Right    BREAST EXCISIONAL BIOPSY Right    child birth x 5     CHOLECYSTECTOMY     COLONOSCOPY  01/09/2008   SHOULDER ARTHROSCOPY WITH DISTAL CLAVICLE RESECTION Left 11/29/2022   Procedure: SHOULDER ARTHROSCOPY WITH DISTAL CLAVICLE EXCISION;  Surgeon: Bjorn Pippin, MD;  Location: [facility];  Service: Orthopedics;  Laterality: Left;   SHOULDER ARTHROSCOPY WITH ROTATOR CUFF REPAIR AND SUBACROMIAL DECOMPRESSION Right 11/03/2013   Procedure: RIGHT SHOULDER ARTHROSCOPY WITH DEBRIDEMENT, ROTATOR CUFF REPAIR AND SUBACROMIAL  DECOMPRESSION, DISTAL CLAVICLE RESECTION;  Surgeon: Kathryne Hitch, MD;  Location: [facility];  Service: Orthopedics;  Laterality: Right;   SHOULDER ARTHROSCOPY WITH SUBACROMIAL DECOMPRESSION, ROTATOR CUFF REPAIR AND BICEP TENDON REPAIR Left 11/29/2022   Procedure: SHOULDER ARTHROSCOPY WITH SUBACROMIAL DECOMPRESSION, ROTATOR CUFF REPAIR AND BICEP TENDON REPAIR;  Surgeon: Bjorn Pippin, MD;  Location: [facility];  Service: Orthopedics;  Laterality: Left;   VESICOVAGINAL FISTULA CLOSURE W/ TAH     for fibroid tumors    Family History family history includes ALS in her brother; Breast cancer in her sister; Colon cancer (age of onset: 26) in her brother; Healthy in her daughter, daughter, son, son, and son; Hypertension in her mother; Liver cancer in her brother; Lung cancer in her brother; Prostate cancer in her brother; Thyroid disease in her sister.  Social History Social History   Socioeconomic History   Marital status: Married    Spouse name: Not on file   Number of children: 5   Years of education: Not on file   Highest education level: Not on file  Occupational History    Employer: [employer]  Tobacco Use   Smoking status: Never    Passive exposure: Never   Smokeless tobacco: Never  Vaping Use   Vaping Use: Never used  Substance and Sexual Activity   Alcohol use: No    Drug use: No   Sexual activity: Not Currently    Birth control/protection: Surgical    Comment: Hyst  Other Topics Concern   Not on file  Social History Narrative   Married, full time Diplomatic Services operational officer. 3 pets    [place]   6-8 hours sleep    Right Handed   Lives in a two story home.    Drinks 1 cup of Coffee   Social Determinants of Health   Financial Resource Strain: Low Risk  (08/16/2021)   Overall Financial Resource Strain (CARDIA)    Difficulty of Paying Living Expenses: Not hard at all  Food Insecurity: No Food Insecurity (08/02/2020)   Hunger Vital Sign    Worried About Running Out of Food in the Last Year: Never true    Ran Out of Food in the Last Year: Never true  Transportation Needs: No Transportation Needs (08/16/2021)   PRAPARE - Administrator, Civil Service (Medical): No    Lack of Transportation (Non-Medical): No  Physical Activity: Insufficiently Active (06/18/2023)   Exercise Vital Sign    Days of Exercise per Week: 3 days    Minutes of Exercise per Session: 30 min  Stress: No Stress Concern Present (06/18/2023)   [employer] of Occupational Health - Occupational Stress Questionnaire    Feeling of Stress : Not at all  Social Connections: Moderately Integrated (08/02/2020)   Social Connection and Isolation Panel [NHANES]    Frequency of Communication with Friends and Family: More than three times a week    Frequency of Social Gatherings with Friends and Family: Three times a week    Attends Religious Services: Never    Active Member of Clubs or Organizations: Yes    Attends Banker Meetings: More than 4 times per year    Marital Status: Married  Catering manager Violence: Not At Risk (08/02/2020)   Humiliation, Afraid, Rape, and Kick questionnaire    Fear of Current or Ex-Partner: No    Emotionally Abused: No    Physically Abused: No    Sexually Abused: No    Lab Results  Component Value Date   CHOL 227 (H) 05/01/2022   Lab  Results  Component Value Date   HDL 59.30 05/01/2022   Lab Results  Component Value Date   LDLCALC 150 (H) 05/01/2022   Lab Results  Component Value Date   TRIG 90.0 05/01/2022   Lab Results  Component Value Date   CHOLHDL 4 05/01/2022   Lab Results  Component Value Date   CREATININE 0.73 06/18/2023   Lab Results  Component Value Date   GFR 82.73 06/18/2023      Component Value Date/Time   NA 141 06/18/2023 0951   K 4.2 06/18/2023 0951   CL 105 06/18/2023 0951   CO2 29 06/18/2023 0951   GLUCOSE 73 06/18/2023 0951   GLUCOSE 98 10/13/2010 0000   BUN 18 06/18/2023 0951   CREATININE 0.73 06/18/2023 0951   CREATININE 0.69 07/29/2020 0915   CALCIUM 10.6 (H) 06/18/2023 0951   CALCIUM 10.2 06/18/2023 0951   PROT 7.4 12/26/2022 0757   ALBUMIN 4.0 06/18/2023 0951   AST 20 12/26/2022 0757   ALT 18 12/26/2022 0757   ALKPHOS 62 12/26/2022 0757   BILITOT 0.5 12/26/2022 0757   GFRNONAA >60 02/09/2023 2158   GFRAA 63 (L) 10/29/2013 0841      Latest Ref Rng & Units 06/18/2023    9:51 AM 02/13/2023   10:10 AM 02/09/2023    9:58 PM  BMP  Glucose 70 - 99 mg/dL 73  77  93   BUN 6 - 23 mg/dL 18  15  15    Creatinine 0.40 - 1.20 mg/dL 1.61  0.96  0.45   Sodium 135 - 145 mEq/L 141  143  134   Potassium 3.5 - 5.1 mEq/L 4.2  3.7  3.0   Chloride 96 - 112 mEq/L 105  108  99   CO2 19 - 32 mEq/L 29  22  26    Calcium 8.4 - 10.5 mg/dL 8.6 - 40.9 mg/dL 81.1    91.4  78.2  9.9        Component Value Date/Time   WBC 7.4 02/09/2023 2158   RBC 5.18 (H) 02/09/2023 2158   HGB 13.2 02/09/2023 2158   HCT 41.6 02/09/2023 2158   PLT 345 02/09/2023 2158   MCV 80.3 02/09/2023 2158   MCH 25.5 (L) 02/09/2023 2158   MCHC 31.7 02/09/2023 2158   RDW 13.2 02/09/2023 2158   LYMPHSABS 2.6 02/09/2023 2158   MONOABS 0.6 02/09/2023 2158   EOSABS 0.0 02/09/2023 2158   BASOSABS 0.0 02/09/2023 2158   Lab Results  Component Value Date   TSH 0.64 05/01/2022   TSH 1.14 05/25/2021   TSH 0.75  07/29/2020   FREET4 0.79 05/25/2021   FREET4 0.80 02/10/2019   FREET4 0.77 10/18/2017         Parts of this note may have been dictated using voice recognition software. There may be variances in spelling and vocabulary which are unintentional. Not all errors are proofread. Please notify the Thereasa Parkin if any discrepancies are noted or if the meaning of any statement is not clear.

## 2023-06-21 LAB — VITAMIN D 1,25 DIHYDROXY
Vitamin D 1, 25 (OH)2 Total: 80 pg/mL — ABNORMAL HIGH (ref 18–72)
Vitamin D2 1, 25 (OH)2: 50 pg/mL
Vitamin D3 1, 25 (OH)2: 30 pg/mL

## 2023-06-21 LAB — PTH, INTACT AND CALCIUM
Calcium: 10.2 mg/dL (ref 8.6–10.4)
PTH: 27 pg/mL (ref 16–77)

## 2023-06-25 ENCOUNTER — Other Ambulatory Visit: Payer: Medicare Other

## 2023-06-25 ENCOUNTER — Telehealth: Payer: Self-pay | Admitting: "Endocrinology

## 2023-06-25 DIAGNOSIS — E559 Vitamin D deficiency, unspecified: Secondary | ICD-10-CM

## 2023-06-25 NOTE — Progress Notes (Signed)
.  24 hour urine drop off

## 2023-06-25 NOTE — Telephone Encounter (Signed)
Patient stated that she was concerned that "Dr. Roosevelt Locks answered 'yes' to the question of renal failure".

## 2023-06-25 NOTE — Telephone Encounter (Signed)
Patient is calling to say that she would like to speak with someone concerning the note that was put in her history .  Per patient her After Visit Summary says yes to that, but she does not.  Patient states that there are more concerns that she would like to voice as well.

## 2023-06-25 NOTE — Telephone Encounter (Signed)
Please Advise,

## 2023-06-26 LAB — CALCIUM, 24-HOUR URINE WITH CREATININE
CALCIUM/CREATININE RATIO: 163 mg/g creat (ref 30–275)
Calcium, 24H Urine: 147 mg/24 h
Creatinine, 24H Ur: 0.9 g/(24.h) (ref 0.50–2.15)

## 2023-07-01 DIAGNOSIS — E559 Vitamin D deficiency, unspecified: Secondary | ICD-10-CM | POA: Diagnosis not present

## 2023-07-01 NOTE — Addendum Note (Signed)
Addended by: Clearnce Sorrel on: 07/01/2023 10:57 AM   Modules accepted: Orders

## 2023-07-01 NOTE — Addendum Note (Signed)
Addended by: Clearnce Sorrel on: 07/01/2023 10:50 AM   Modules accepted: Orders

## 2023-07-02 LAB — CALCIUM, 24-HOUR URINE WITH CREATININE
CALCIUM/CREATININE RATIO: 184 mg/g creat (ref 30–275)
Calcium, 24H Urine: 171 mg/24 h
Creatinine, 24H Ur: 0.93 g/(24.h) (ref 0.50–2.15)

## 2023-07-11 NOTE — Progress Notes (Unsigned)
[name] [address] [place] [address] Phone: [phone] Subjective:   Kaylee Adams, am serving as a scribe for Dr. Antoine Primas.  I'm seeing this patient by the request  of:  Panosh, Neta Mends, MD  CC: Shoulder pain follow-up  [id number]  04/16/2023 Seems significantly better at this time.  No other changes in management at this moment.  Discussed icing regimen and home exercises and will follow-up with me in 3 months      Update 07/16/2023 Kaylee Adams is a 72 y.o. female coming in with complaint of L shoulder pain.  Patient is doing much better previously.  Patient states that her shoulder is doing fine.   She continues to have pain in L hip and leg pain. Pain over GT that radiates down into the lateral calf. Also having pain in L SI joint. Patient takes IBU prn.        Past Medical History:  Diagnosis Date   Allergy    occ takes otc allergy pill   Arthritis    CAD (coronary artery disease)    a. 11/2005 Cath: nonobs dzs, 30% LAD lesion on cath ;  b. 11/2005 Echo: NL EF;  c. 04/2012 Echo: EF 55-60%, Gr 2 DD;  d. 04/2012 Ex MV: EF 72%, ST depression in recovery with NL perfusion imaging - HTN response to exercise.   Chest pressure "fatigue" 05/08/2012   GERD (gastroesophageal reflux disease)    occ prilosec OTC   Hepatic cyst    on ct [place]   HLD (hyperlipidemia)    HTN (hypertension)    Midsternal chest pain    cardiology eval with non-ischemic stress test 04/2012   Upper airway cough syndrome 04/29/2015   Trial of max gerd rx 04/29/2015 >>>     Urticaria    Past Surgical History:  Procedure Laterality Date   ABDOMINAL HYSTERECTOMY     fibroid tumors   BREAST BIOPSY Right    BREAST EXCISIONAL BIOPSY Right    child birth x 5     CHOLECYSTECTOMY     COLONOSCOPY  01/09/2008   SHOULDER ARTHROSCOPY WITH DISTAL CLAVICLE RESECTION Left 11/29/2022   Procedure: SHOULDER ARTHROSCOPY WITH DISTAL CLAVICLE EXCISION;  Surgeon:  Bjorn Pippin, MD;  Location: [facility];  Service: Orthopedics;  Laterality: Left;   SHOULDER ARTHROSCOPY WITH ROTATOR CUFF REPAIR AND SUBACROMIAL DECOMPRESSION Right 11/03/2013   Procedure: RIGHT SHOULDER ARTHROSCOPY WITH DEBRIDEMENT, ROTATOR CUFF REPAIR AND SUBACROMIAL DECOMPRESSION, DISTAL CLAVICLE RESECTION;  Surgeon: Kathryne Hitch, MD;  Location: [facility];  Service: Orthopedics;  Laterality: Right;   SHOULDER ARTHROSCOPY WITH SUBACROMIAL DECOMPRESSION, ROTATOR CUFF REPAIR AND BICEP TENDON REPAIR Left 11/29/2022   Procedure: SHOULDER ARTHROSCOPY WITH SUBACROMIAL DECOMPRESSION, ROTATOR CUFF REPAIR AND BICEP TENDON REPAIR;  Surgeon: Bjorn Pippin, MD;  Location: [facility];  Service: Orthopedics;  Laterality: Left;   VESICOVAGINAL FISTULA CLOSURE W/ TAH     for fibroid tumors   Social History   Socioeconomic History   Marital status: Married    Spouse name: Not on file   Number of children: 5   Years of education: Not on file   Highest education level: Not on file  Occupational History    Employer: [employer]  Tobacco Use   Smoking status: Never    Passive exposure: Never   Smokeless tobacco: Never  Vaping Use   Vaping status: Never Used  Substance and Sexual Activity   Alcohol  use: No   Drug use: No   Sexual activity: Not Currently    Birth control/protection: Surgical    Comment: Hyst  Other Topics Concern   Not on file  Social History Narrative   Married, full time Diplomatic Services operational officer. 3 pets    [place]   6-8 hours sleep    Right Handed   Lives in a two story home.    Drinks 1 cup of Coffee   Social Determinants of Health   Financial Resource Strain: Low Risk  (08/16/2021)   Overall Financial Resource Strain (CARDIA)    Difficulty of Paying Living Expenses: Not hard at all  Food Insecurity: No Food Insecurity (08/02/2020)   Hunger Vital Sign    Worried About Running Out of Food in the Last Year: Never true    Ran Out of Food in  the Last Year: Never true  Transportation Needs: No Transportation Needs (08/16/2021)   PRAPARE - Administrator, Civil Service (Medical): No    Lack of Transportation (Non-Medical): No  Physical Activity: Insufficiently Active (06/18/2023)   Exercise Vital Sign    Days of Exercise per Week: 3 days    Minutes of Exercise per Session: 30 min  Stress: No Stress Concern Present (06/18/2023)   [employer] of Occupational Health - Occupational Stress Questionnaire    Feeling of Stress : Not at all  Social Connections: Moderately Integrated (08/02/2020)   Social Connection and Isolation Panel [NHANES]    Frequency of Communication with Friends and Family: More than three times a week    Frequency of Social Gatherings with Friends and Family: Three times a week    Attends Religious Services: Never    Active Member of Clubs or Organizations: Yes    Attends Engineer, structural: More than 4 times per year    Marital Status: Married   Allergies  Allergen Reactions   Lisinopril     REACTION: cough   Penicillins Nausea Only   Family History  Problem Relation Age of Onset   Breast cancer Sister    Thyroid disease Sister    Hypertension Mother    Colon cancer Brother 48       dx'd about age 30 per pt-    Prostate cancer Brother    Lung cancer Brother    Liver cancer Brother    ALS Brother    Healthy Daughter    Healthy Daughter    Healthy Son    Healthy Son    Healthy Son    Colon polyps Neg Hx    Esophageal cancer Neg Hx    Rectal cancer Neg Hx    Stomach cancer Neg Hx      Current Outpatient Medications (Cardiovascular):    amLODipine (NORVASC) 10 MG tablet, Take 1 tablet (10 mg total) by mouth daily.   losartan (COZAAR) 100 MG tablet, Take 1 tablet (100 mg total) by mouth daily. Instead of losartanhctz   rosuvastatin (CRESTOR) 5 MG tablet, Take 1 tablet (5 mg total) by mouth daily. May begin with 5 mg 3 x per week and increase to 1 po qd  Current  Outpatient Medications (Respiratory):    albuterol (VENTOLIN HFA) 108 (90 Base) MCG/ACT inhaler, Inhale 1-2 puffs into the lungs every 6 (six) hours as needed for wheezing or shortness of breath.   Fexofenadine HCl (ALLEGRA PO), Take by mouth.   fluticasone (FLONASE) 50 MCG/ACT nasal spray,   Current Outpatient Medications (Analgesics):    aspirin 81  MG chewable tablet, Chew by mouth daily.  Current Outpatient Medications (Hematological):    Cyanocobalamin (VITAMIN B-12 PO), Take 5,000 mcg by mouth. Liquid  Current Outpatient Medications (Other):    ascorbic acid (VITAMIN C) 500 MG tablet, Take 1 tablet by mouth daily.   gabapentin (NEURONTIN) 100 MG capsule, TAKE 2 CAPSULES BY MOUTH AT BEDTIME. (Patient taking differently: Take 200 mg by mouth at bedtime. Take 1 tab in a.m, 1 at noon, 2 tabs at night.)   MAGNESIUM PO, Take by mouth daily.   MULTIPLE VITAMIN PO, Take 1 capsule by mouth daily.   omeprazole (PRILOSEC OTC) 20 MG tablet, Take 20 mg by mouth daily.   Vitamin D, Ergocalciferol, (DRISDOL) 1.25 MG (50000 UNIT) CAPS capsule, Take 1 capsule (50,000 Units total) by mouth every 7 (seven) days.   Reviewed prior external information including notes and imaging from  primary care provider As well as notes that were available from care everywhere and other healthcare systems.  Past medical history, social, surgical and family history all reviewed in electronic medical record.  No pertanent information unless stated regarding to the chief complaint.   Review of Systems:  No headache, visual changes, nausea, vomiting, diarrhea, constipation, dizziness, abdominal pain, skin rash, fevers, chills, night sweats, weight loss, swollen lymph nodes, body aches, joint swelling, chest pain, shortness of breath, mood changes. POSITIVE muscle aches  Objective  Blood pressure 132/72, temperature (!) 92 F (33.3 C), height 5' (1.524 m), weight 163 lb (73.9 kg), SpO2 99%.   General: No apparent  distress alert and oriented x3 mood and affect normal, dressed appropriately.  HEENT: Pupils equal, extraocular movements intact  Respiratory: Patient's speak in full sentences and does not appear short of breath  Cardiovascular: No lower extremity edema, non tender, no erythema  Shoulder exam shows good range of motion at the moment.  No significant difficulty.  Severe tenderness to palpation over the greater trochanteric area on the left side.     Impression and Recommendations:     The above documentation has been reviewed and is accurate and complete Judi Saa, DO

## 2023-07-16 ENCOUNTER — Encounter: Payer: Self-pay | Admitting: Family Medicine

## 2023-07-16 ENCOUNTER — Other Ambulatory Visit: Payer: Self-pay

## 2023-07-16 ENCOUNTER — Ambulatory Visit (INDEPENDENT_AMBULATORY_CARE_PROVIDER_SITE_OTHER): Payer: Medicare Other | Admitting: Family Medicine

## 2023-07-16 VITALS — BP 132/72 | Temp 92.0°F | Ht 60.0 in | Wt 163.0 lb

## 2023-07-16 DIAGNOSIS — G8929 Other chronic pain: Secondary | ICD-10-CM

## 2023-07-16 DIAGNOSIS — M25512 Pain in left shoulder: Secondary | ICD-10-CM

## 2023-07-16 DIAGNOSIS — M7062 Trochanteric bursitis, left hip: Secondary | ICD-10-CM | POA: Diagnosis not present

## 2023-07-16 NOTE — Assessment & Plan Note (Signed)
Patient seem to have more of a recurrent pain noted.  Differential includes lumbar radiculopathy.  We discussed different management including formal physical therapy and injection.  Patient declined these today.  We also discussed the possibility of MRI if worsening symptoms.  At this moment patient wants to continue with conservative therapy.  States that she has a lot of other stressors in her life at the moment that are not medically related.  We discussed that we are here for her if she needed anything that she wanted to continue to work this out on her own at the moment.  Patient feels like she has a good support system around her.  Follow-up with me again in 2 to 3 months otherwise.

## 2023-07-16 NOTE — Patient Instructions (Signed)
Exercises reprinted See me again in 3 months

## 2023-07-17 ENCOUNTER — Encounter: Payer: Self-pay | Admitting: Internal Medicine

## 2023-08-07 DIAGNOSIS — M8588 Other specified disorders of bone density and structure, other site: Secondary | ICD-10-CM | POA: Diagnosis not present

## 2023-08-07 DIAGNOSIS — N958 Other specified menopausal and perimenopausal disorders: Secondary | ICD-10-CM | POA: Diagnosis not present

## 2023-08-22 DIAGNOSIS — I10 Essential (primary) hypertension: Secondary | ICD-10-CM | POA: Diagnosis not present

## 2023-08-22 DIAGNOSIS — E7800 Pure hypercholesterolemia, unspecified: Secondary | ICD-10-CM | POA: Diagnosis not present

## 2023-08-22 DIAGNOSIS — Z79899 Other long term (current) drug therapy: Secondary | ICD-10-CM | POA: Diagnosis not present

## 2023-08-22 DIAGNOSIS — E559 Vitamin D deficiency, unspecified: Secondary | ICD-10-CM | POA: Diagnosis not present

## 2023-08-23 DIAGNOSIS — R82998 Other abnormal findings in urine: Secondary | ICD-10-CM | POA: Diagnosis not present

## 2023-09-25 DIAGNOSIS — Z0000 Encounter for general adult medical examination without abnormal findings: Secondary | ICD-10-CM | POA: Diagnosis not present

## 2023-09-25 DIAGNOSIS — E7800 Pure hypercholesterolemia, unspecified: Secondary | ICD-10-CM | POA: Diagnosis not present

## 2023-09-25 DIAGNOSIS — I10 Essential (primary) hypertension: Secondary | ICD-10-CM | POA: Diagnosis not present

## 2023-10-01 ENCOUNTER — Other Ambulatory Visit: Payer: Self-pay | Admitting: Internal Medicine

## 2023-10-07 DIAGNOSIS — Z23 Encounter for immunization: Secondary | ICD-10-CM | POA: Diagnosis not present

## 2023-10-16 ENCOUNTER — Other Ambulatory Visit: Payer: Medicare Other

## 2023-10-22 ENCOUNTER — Ambulatory Visit: Payer: Medicare Other | Admitting: "Endocrinology

## 2023-10-24 DIAGNOSIS — D1721 Benign lipomatous neoplasm of skin and subcutaneous tissue of right arm: Secondary | ICD-10-CM | POA: Diagnosis not present

## 2023-10-26 DIAGNOSIS — D171 Benign lipomatous neoplasm of skin and subcutaneous tissue of trunk: Secondary | ICD-10-CM | POA: Diagnosis not present

## 2023-10-26 DIAGNOSIS — M25511 Pain in right shoulder: Secondary | ICD-10-CM | POA: Diagnosis not present

## 2023-10-26 DIAGNOSIS — I10 Essential (primary) hypertension: Secondary | ICD-10-CM | POA: Diagnosis not present

## 2023-10-31 DIAGNOSIS — Z09 Encounter for follow-up examination after completed treatment for conditions other than malignant neoplasm: Secondary | ICD-10-CM | POA: Diagnosis not present

## 2023-10-31 DIAGNOSIS — D1721 Benign lipomatous neoplasm of skin and subcutaneous tissue of right arm: Secondary | ICD-10-CM | POA: Diagnosis not present

## 2023-11-20 DIAGNOSIS — M7521 Bicipital tendinitis, right shoulder: Secondary | ICD-10-CM | POA: Diagnosis not present

## 2023-11-20 DIAGNOSIS — D1721 Benign lipomatous neoplasm of skin and subcutaneous tissue of right arm: Secondary | ICD-10-CM | POA: Diagnosis not present

## 2023-12-04 DIAGNOSIS — G8929 Other chronic pain: Secondary | ICD-10-CM | POA: Diagnosis not present

## 2023-12-04 DIAGNOSIS — M25511 Pain in right shoulder: Secondary | ICD-10-CM | POA: Diagnosis not present

## 2023-12-15 ENCOUNTER — Other Ambulatory Visit: Payer: Self-pay | Admitting: Internal Medicine

## 2024-01-01 DIAGNOSIS — E7800 Pure hypercholesterolemia, unspecified: Secondary | ICD-10-CM | POA: Diagnosis not present

## 2024-01-01 DIAGNOSIS — R1011 Right upper quadrant pain: Secondary | ICD-10-CM | POA: Diagnosis not present

## 2024-01-01 DIAGNOSIS — I10 Essential (primary) hypertension: Secondary | ICD-10-CM | POA: Diagnosis not present

## 2024-01-16 DIAGNOSIS — R1011 Right upper quadrant pain: Secondary | ICD-10-CM | POA: Diagnosis not present

## 2024-01-30 DIAGNOSIS — F4310 Post-traumatic stress disorder, unspecified: Secondary | ICD-10-CM | POA: Diagnosis not present

## 2024-02-06 DIAGNOSIS — F4310 Post-traumatic stress disorder, unspecified: Secondary | ICD-10-CM | POA: Diagnosis not present

## 2024-04-07 DIAGNOSIS — M542 Cervicalgia: Secondary | ICD-10-CM | POA: Diagnosis not present

## 2024-04-07 DIAGNOSIS — M5412 Radiculopathy, cervical region: Secondary | ICD-10-CM | POA: Diagnosis not present

## 2024-04-21 DIAGNOSIS — M542 Cervicalgia: Secondary | ICD-10-CM | POA: Diagnosis not present

## 2024-04-27 DIAGNOSIS — M542 Cervicalgia: Secondary | ICD-10-CM | POA: Diagnosis not present

## 2024-04-30 DIAGNOSIS — Z6830 Body mass index (BMI) 30.0-30.9, adult: Secondary | ICD-10-CM | POA: Diagnosis not present

## 2024-04-30 DIAGNOSIS — Z1231 Encounter for screening mammogram for malignant neoplasm of breast: Secondary | ICD-10-CM | POA: Diagnosis not present

## 2024-04-30 DIAGNOSIS — Z01419 Encounter for gynecological examination (general) (routine) without abnormal findings: Secondary | ICD-10-CM | POA: Diagnosis not present

## 2024-05-11 DIAGNOSIS — M542 Cervicalgia: Secondary | ICD-10-CM | POA: Diagnosis not present

## 2024-05-19 DIAGNOSIS — M542 Cervicalgia: Secondary | ICD-10-CM | POA: Diagnosis not present

## 2024-06-02 DIAGNOSIS — M542 Cervicalgia: Secondary | ICD-10-CM | POA: Diagnosis not present

## 2024-06-11 DIAGNOSIS — Z803 Family history of malignant neoplasm of breast: Secondary | ICD-10-CM | POA: Diagnosis not present

## 2024-06-12 DIAGNOSIS — H2513 Age-related nuclear cataract, bilateral: Secondary | ICD-10-CM | POA: Diagnosis not present

## 2024-06-12 DIAGNOSIS — H524 Presbyopia: Secondary | ICD-10-CM | POA: Diagnosis not present

## 2024-06-12 DIAGNOSIS — H5203 Hypermetropia, bilateral: Secondary | ICD-10-CM | POA: Diagnosis not present

## 2024-06-12 DIAGNOSIS — H25013 Cortical age-related cataract, bilateral: Secondary | ICD-10-CM | POA: Diagnosis not present

## 2024-06-30 DIAGNOSIS — I10 Essential (primary) hypertension: Secondary | ICD-10-CM | POA: Diagnosis not present

## 2024-06-30 DIAGNOSIS — E782 Mixed hyperlipidemia: Secondary | ICD-10-CM | POA: Diagnosis not present

## 2024-07-27 DIAGNOSIS — L819 Disorder of pigmentation, unspecified: Secondary | ICD-10-CM | POA: Diagnosis not present

## 2024-07-31 DIAGNOSIS — F330 Major depressive disorder, recurrent, mild: Secondary | ICD-10-CM | POA: Diagnosis not present

## 2024-08-03 DIAGNOSIS — R222 Localized swelling, mass and lump, trunk: Secondary | ICD-10-CM | POA: Diagnosis not present

## 2024-08-03 DIAGNOSIS — D1721 Benign lipomatous neoplasm of skin and subcutaneous tissue of right arm: Secondary | ICD-10-CM | POA: Diagnosis not present

## 2024-08-12 DIAGNOSIS — F330 Major depressive disorder, recurrent, mild: Secondary | ICD-10-CM | POA: Diagnosis not present

## 2024-08-13 DIAGNOSIS — M7989 Other specified soft tissue disorders: Secondary | ICD-10-CM | POA: Diagnosis not present

## 2024-08-13 DIAGNOSIS — D1721 Benign lipomatous neoplasm of skin and subcutaneous tissue of right arm: Secondary | ICD-10-CM | POA: Diagnosis not present

## 2024-08-20 DIAGNOSIS — F330 Major depressive disorder, recurrent, mild: Secondary | ICD-10-CM | POA: Diagnosis not present

## 2024-08-23 DIAGNOSIS — M19011 Primary osteoarthritis, right shoulder: Secondary | ICD-10-CM | POA: Diagnosis not present

## 2024-08-23 DIAGNOSIS — M67813 Other specified disorders of tendon, right shoulder: Secondary | ICD-10-CM | POA: Diagnosis not present

## 2024-08-23 DIAGNOSIS — M65911 Unspecified synovitis and tenosynovitis, right shoulder: Secondary | ICD-10-CM | POA: Diagnosis not present

## 2024-09-01 DIAGNOSIS — M7989 Other specified soft tissue disorders: Secondary | ICD-10-CM | POA: Diagnosis not present

## 2024-09-01 DIAGNOSIS — D1721 Benign lipomatous neoplasm of skin and subcutaneous tissue of right arm: Secondary | ICD-10-CM | POA: Diagnosis not present

## 2024-09-01 DIAGNOSIS — M75101 Unspecified rotator cuff tear or rupture of right shoulder, not specified as traumatic: Secondary | ICD-10-CM | POA: Diagnosis not present

## 2024-09-24 DIAGNOSIS — M75111 Incomplete rotator cuff tear or rupture of right shoulder, not specified as traumatic: Secondary | ICD-10-CM | POA: Diagnosis not present

## 2024-09-24 DIAGNOSIS — D1721 Benign lipomatous neoplasm of skin and subcutaneous tissue of right arm: Secondary | ICD-10-CM | POA: Diagnosis not present

## 2024-09-29 DIAGNOSIS — Z0000 Encounter for general adult medical examination without abnormal findings: Secondary | ICD-10-CM | POA: Diagnosis not present

## 2024-09-29 DIAGNOSIS — Z23 Encounter for immunization: Secondary | ICD-10-CM | POA: Diagnosis not present

## 2024-09-29 DIAGNOSIS — E782 Mixed hyperlipidemia: Secondary | ICD-10-CM | POA: Diagnosis not present

## 2024-09-29 DIAGNOSIS — Z1331 Encounter for screening for depression: Secondary | ICD-10-CM | POA: Diagnosis not present

## 2024-09-29 DIAGNOSIS — Z7189 Other specified counseling: Secondary | ICD-10-CM | POA: Diagnosis not present

## 2024-09-29 DIAGNOSIS — Z131 Encounter for screening for diabetes mellitus: Secondary | ICD-10-CM | POA: Diagnosis not present

## 2024-09-30 DIAGNOSIS — F330 Major depressive disorder, recurrent, mild: Secondary | ICD-10-CM | POA: Diagnosis not present

## 2024-10-07 DIAGNOSIS — F330 Major depressive disorder, recurrent, mild: Secondary | ICD-10-CM | POA: Diagnosis not present

## 2024-10-16 DIAGNOSIS — F330 Major depressive disorder, recurrent, mild: Secondary | ICD-10-CM | POA: Diagnosis not present

## 2024-10-21 DIAGNOSIS — F330 Major depressive disorder, recurrent, mild: Secondary | ICD-10-CM | POA: Diagnosis not present

## 2024-10-27 DIAGNOSIS — F330 Major depressive disorder, recurrent, mild: Secondary | ICD-10-CM | POA: Diagnosis not present

## 2024-11-10 DIAGNOSIS — F330 Major depressive disorder, recurrent, mild: Secondary | ICD-10-CM | POA: Diagnosis not present

## 2024-11-24 DIAGNOSIS — F330 Major depressive disorder, recurrent, mild: Secondary | ICD-10-CM | POA: Diagnosis not present

## 2024-12-08 DIAGNOSIS — F330 Major depressive disorder, recurrent, mild: Secondary | ICD-10-CM | POA: Diagnosis not present

## 2025-03-16 ENCOUNTER — Ambulatory Visit: Admitting: Dermatology
